# Patient Record
Sex: Male | Born: 1950 | Race: White | Hispanic: No | Smoking: Former smoker
Health system: Southern US, Community
[De-identification: ages and names within clinical notes are randomized; demographics above are authoritative.]

## PROBLEM LIST (undated history)

## (undated) DIAGNOSIS — N028 Recurrent and persistent hematuria with other morphologic changes: Secondary | ICD-10-CM

## (undated) DIAGNOSIS — F410 Panic disorder [episodic paroxysmal anxiety] without agoraphobia: Secondary | ICD-10-CM

## (undated) DIAGNOSIS — I714 Abdominal aortic aneurysm, without rupture, unspecified: Secondary | ICD-10-CM

## (undated) DIAGNOSIS — J449 Chronic obstructive pulmonary disease, unspecified: Secondary | ICD-10-CM

## (undated) DIAGNOSIS — G4730 Sleep apnea, unspecified: Secondary | ICD-10-CM

## (undated) DIAGNOSIS — K219 Gastro-esophageal reflux disease without esophagitis: Secondary | ICD-10-CM

## (undated) DIAGNOSIS — N189 Chronic kidney disease, unspecified: Secondary | ICD-10-CM

## (undated) DIAGNOSIS — C44311 Basal cell carcinoma of skin of nose: Secondary | ICD-10-CM

## (undated) DIAGNOSIS — I471 Supraventricular tachycardia: Secondary | ICD-10-CM

## (undated) DIAGNOSIS — N049 Nephrotic syndrome with unspecified morphologic changes: Secondary | ICD-10-CM

## (undated) DIAGNOSIS — N02B9 Other recurrent and persistent immunoglobulin A nephropathy: Secondary | ICD-10-CM

## (undated) HISTORY — DX: Chronic kidney disease, unspecified: N18.9

## (undated) HISTORY — PX: KNEE ARTHROSCOPY: SHX127

## (undated) HISTORY — PX: FRACTURE SURGERY: SHX138

## (undated) HISTORY — DX: Nephrotic syndrome with unspecified morphologic changes: N04.9

## (undated) HISTORY — PX: BASAL CELL CARCINOMA EXCISION: SHX1214

## (undated) HISTORY — PX: TONSILLECTOMY AND ADENOIDECTOMY: SUR1326

---

## 1968-08-11 HISTORY — PX: FEMUR FRACTURE SURGERY: SHX633

## 1969-08-11 HISTORY — PX: FEMUR HARDWARE REMOVAL: SUR1124

## 1997-11-10 ENCOUNTER — Emergency Department (HOSPITAL_COMMUNITY): Admission: EM | Admit: 1997-11-10 | Discharge: 1997-11-10 | Payer: Self-pay | Admitting: Emergency Medicine

## 1997-11-19 ENCOUNTER — Emergency Department (HOSPITAL_COMMUNITY): Admission: EM | Admit: 1997-11-19 | Discharge: 1997-11-19 | Payer: Self-pay | Admitting: Emergency Medicine

## 2000-06-01 ENCOUNTER — Encounter: Payer: Self-pay | Admitting: Occupational Medicine

## 2000-06-01 ENCOUNTER — Encounter: Admission: RE | Admit: 2000-06-01 | Discharge: 2000-06-01 | Payer: Self-pay | Admitting: Occupational Medicine

## 2002-05-30 ENCOUNTER — Encounter: Admission: RE | Admit: 2002-05-30 | Discharge: 2002-05-30 | Payer: Self-pay | Admitting: *Deleted

## 2004-06-10 ENCOUNTER — Encounter: Admission: RE | Admit: 2004-06-10 | Discharge: 2004-06-10 | Payer: Self-pay | Admitting: Occupational Medicine

## 2006-08-24 ENCOUNTER — Encounter: Admission: RE | Admit: 2006-08-24 | Discharge: 2006-08-24 | Payer: Self-pay | Admitting: Occupational Medicine

## 2006-09-25 ENCOUNTER — Ambulatory Visit (HOSPITAL_COMMUNITY): Admission: RE | Admit: 2006-09-25 | Discharge: 2006-09-25 | Payer: Self-pay | Admitting: Family Medicine

## 2006-09-25 ENCOUNTER — Ambulatory Visit: Payer: Self-pay | Admitting: Vascular Surgery

## 2008-08-23 ENCOUNTER — Encounter: Admission: RE | Admit: 2008-08-23 | Discharge: 2008-08-23 | Payer: Self-pay | Admitting: Occupational Medicine

## 2014-11-20 DIAGNOSIS — I872 Venous insufficiency (chronic) (peripheral): Secondary | ICD-10-CM | POA: Diagnosis not present

## 2014-12-11 ENCOUNTER — Emergency Department (HOSPITAL_COMMUNITY): Payer: Medicare Other

## 2014-12-11 ENCOUNTER — Emergency Department (HOSPITAL_BASED_OUTPATIENT_CLINIC_OR_DEPARTMENT_OTHER): Payer: Medicare Other

## 2014-12-11 ENCOUNTER — Encounter (HOSPITAL_COMMUNITY): Payer: Self-pay | Admitting: *Deleted

## 2014-12-11 ENCOUNTER — Emergency Department (HOSPITAL_COMMUNITY)
Admission: EM | Admit: 2014-12-11 | Discharge: 2014-12-11 | Disposition: A | Discharge status: Home / Self Care | Payer: Medicare Other | Attending: Emergency Medicine | Admitting: Emergency Medicine

## 2014-12-11 DIAGNOSIS — Z720 Tobacco use: Secondary | ICD-10-CM | POA: Insufficient documentation

## 2014-12-11 DIAGNOSIS — L97529 Non-pressure chronic ulcer of other part of left foot with unspecified severity: Secondary | ICD-10-CM

## 2014-12-11 DIAGNOSIS — M7989 Other specified soft tissue disorders: Secondary | ICD-10-CM | POA: Diagnosis not present

## 2014-12-11 DIAGNOSIS — L03116 Cellulitis of left lower limb: Secondary | ICD-10-CM | POA: Insufficient documentation

## 2014-12-11 DIAGNOSIS — L97829 Non-pressure chronic ulcer of other part of left lower leg with unspecified severity: Secondary | ICD-10-CM | POA: Diagnosis not present

## 2014-12-11 DIAGNOSIS — L02416 Cutaneous abscess of left lower limb: Secondary | ICD-10-CM | POA: Diagnosis present

## 2014-12-11 LAB — COMPREHENSIVE METABOLIC PANEL
ALT: 28 U/L (ref 17–63)
AST: 39 U/L (ref 15–41)
Albumin: 3.8 g/dL (ref 3.5–5.0)
Alkaline Phosphatase: 71 U/L (ref 38–126)
Anion gap: 10 (ref 5–15)
BILIRUBIN TOTAL: 0.8 mg/dL (ref 0.3–1.2)
BUN: 16 mg/dL (ref 6–20)
CHLORIDE: 104 mmol/L (ref 101–111)
CO2: 23 mmol/L (ref 22–32)
Calcium: 9.2 mg/dL (ref 8.9–10.3)
Creatinine, Ser: 0.91 mg/dL (ref 0.61–1.24)
GFR calc Af Amer: >60 mL/min (ref >60)
GLUCOSE: 102 mg/dL — AB (ref 70–99)
Potassium: 4.2 mmol/L (ref 3.5–5.1)
Sodium: 137 mmol/L (ref 135–145)
TOTAL PROTEIN: 6.9 g/dL (ref 6.5–8.1)

## 2014-12-11 LAB — CBC WITH DIFFERENTIAL/PLATELET
Basophils Absolute: 0 10*3/uL (ref 0.0–0.1)
Basophils Relative: 1 % (ref 0–1)
Eosinophils Absolute: 0.1 10*3/uL (ref 0.0–0.7)
Eosinophils Relative: 2 % (ref 0–5)
HEMATOCRIT: 46.7 % (ref 39.0–52.0)
HEMOGLOBIN: 16.1 g/dL (ref 13.0–17.0)
Lymphocytes Relative: 14 % (ref 12–46)
Lymphs Abs: 1.2 10*3/uL (ref 0.7–4.0)
MCH: 29.1 pg (ref 26.0–34.0)
MCHC: 34.5 g/dL (ref 30.0–36.0)
MCV: 84.3 fL (ref 78.0–100.0)
Monocytes Absolute: 0.5 10*3/uL (ref 0.1–1.0)
Monocytes Relative: 5 % (ref 3–12)
Neutro Abs: 6.8 10*3/uL (ref 1.7–7.7)
Neutrophils Relative %: 78 % — ABNORMAL HIGH (ref 43–77)
PLATELETS: 213 10*3/uL (ref 150–400)
RBC: 5.54 MIL/uL (ref 4.22–5.81)
RDW: 14.1 % (ref 11.5–15.5)
WBC: 8.7 10*3/uL (ref 4.0–10.5)

## 2014-12-11 LAB — I-STAT CG4 LACTIC ACID, ED: LACTIC ACID, VENOUS: 0.83 mmol/L (ref 0.5–2.0)

## 2014-12-11 MED ORDER — DOXYCYCLINE HYCLATE 100 MG PO CAPS: 100.0000 mg | ORAL_CAPSULE | Freq: Two times a day (BID) | ORAL | Status: DC

## 2014-12-11 MED ORDER — SODIUM CHLORIDE 0.9 % IV BOLUS (SEPSIS)
1000.0000 mL | Freq: Once | INTRAVENOUS | Status: AC
  Administered 2014-12-11: 1000 mL via INTRAVENOUS

## 2014-12-11 NOTE — Progress Notes (Signed)
*  Preliminary Results* Left lower extremity venous duplex completed. Left lower extremity is negative for deep vein thrombosis. There is no evidence of left Baker's cyst.  12/11/2014 3:42 PM  Maudry Mayhew, RVT, RDCS, RDMS for [name], RVT

## 2014-12-11 NOTE — ED Notes (Signed)
Pt states rash and abscesses since January.  States he did not go to pcp b/c he doesn't need one.  Multiple ulcers to L foot and leg with malodorous drainage.
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
## 2014-12-11 NOTE — ED Notes (Signed)
Provider at the bedside.  

## 2014-12-18 ENCOUNTER — Encounter: Payer: Self-pay | Admitting: Family Medicine

## 2014-12-18 ENCOUNTER — Ambulatory Visit: Payer: Medicare Other | Attending: Family Medicine | Admitting: Family Medicine

## 2014-12-18 VITALS — BP 131/84 | HR 84 | Temp 98.1°F | Wt 229.0 lb

## 2014-12-18 DIAGNOSIS — L03116 Cellulitis of left lower limb: Secondary | ICD-10-CM | POA: Diagnosis not present
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
## 2014-12-18 NOTE — Patient Instructions (Signed)
Leave dressings in place until you see the [facility]. I would recommend you make an appointment in the near future with your assigned primary care provider her for routine health I would recommend you consider smoking cesssation. Someone will call you about an appointment with Wound Care.

## 2014-12-19 DIAGNOSIS — L03116 Cellulitis of left lower limb: Secondary | ICD-10-CM | POA: Insufficient documentation

## 2015-01-22 ENCOUNTER — Encounter (HOSPITAL_BASED_OUTPATIENT_CLINIC_OR_DEPARTMENT_OTHER): Payer: Medicare Other | Attending: Plastic Surgery

## 2015-01-22 DIAGNOSIS — L97221 Non-pressure chronic ulcer of left calf limited to breakdown of skin: Secondary | ICD-10-CM | POA: Insufficient documentation

## 2015-01-22 DIAGNOSIS — F17210 Nicotine dependence, cigarettes, uncomplicated: Secondary | ICD-10-CM | POA: Insufficient documentation

## 2015-01-22 DIAGNOSIS — L97521 Non-pressure chronic ulcer of other part of left foot limited to breakdown of skin: Secondary | ICD-10-CM | POA: Diagnosis not present

## 2015-01-22 DIAGNOSIS — L97321 Non-pressure chronic ulcer of left ankle limited to breakdown of skin: Secondary | ICD-10-CM | POA: Insufficient documentation

## 2015-01-24 DIAGNOSIS — L97901 Non-pressure chronic ulcer of unspecified part of unspecified lower leg limited to breakdown of skin: Secondary | ICD-10-CM | POA: Diagnosis not present

## 2015-01-24 DIAGNOSIS — F17293 Nicotine dependence, other tobacco product, with withdrawal: Secondary | ICD-10-CM | POA: Diagnosis not present

## 2015-01-24 LAB — BASIC METABOLIC PANEL: Glucose: 114 mg/dL

## 2015-01-24 LAB — HEMOGLOBIN A1C: Hgb A1c MFr Bld: 5.6 % (ref 4.0–6.0)

## 2015-01-26 ENCOUNTER — Other Ambulatory Visit: Payer: Self-pay | Admitting: *Deleted

## 2015-01-26 DIAGNOSIS — L97929 Non-pressure chronic ulcer of unspecified part of left lower leg with unspecified severity: Secondary | ICD-10-CM

## 2015-01-29 DIAGNOSIS — L97521 Non-pressure chronic ulcer of other part of left foot limited to breakdown of skin: Secondary | ICD-10-CM | POA: Diagnosis not present

## 2015-01-29 DIAGNOSIS — L97221 Non-pressure chronic ulcer of left calf limited to breakdown of skin: Secondary | ICD-10-CM | POA: Diagnosis not present

## 2015-01-29 DIAGNOSIS — L97321 Non-pressure chronic ulcer of left ankle limited to breakdown of skin: Secondary | ICD-10-CM | POA: Diagnosis not present

## 2015-01-29 DIAGNOSIS — F17210 Nicotine dependence, cigarettes, uncomplicated: Secondary | ICD-10-CM | POA: Diagnosis not present

## 2015-01-31 ENCOUNTER — Ambulatory Visit (INDEPENDENT_AMBULATORY_CARE_PROVIDER_SITE_OTHER): Payer: Medicare Other | Admitting: Family

## 2015-01-31 ENCOUNTER — Encounter: Payer: Self-pay | Admitting: Family

## 2015-01-31 VITALS — BP 128/90 | HR 80 | Temp 98.3°F | Resp 18 | Ht 76.0 in | Wt 226.0 lb

## 2015-01-31 DIAGNOSIS — E785 Hyperlipidemia, unspecified: Secondary | ICD-10-CM

## 2015-01-31 DIAGNOSIS — Z0000 Encounter for general adult medical examination without abnormal findings: Secondary | ICD-10-CM | POA: Diagnosis not present

## 2015-01-31 DIAGNOSIS — Z23 Encounter for immunization: Secondary | ICD-10-CM

## 2015-01-31 NOTE — Progress Notes (Signed)
Pre visit review using our clinic review tool, if applicable. No additional management support is needed unless otherwise documented below in the visit note. 

## 2015-01-31 NOTE — Addendum Note (Signed)
Addended by: Delice Bison E on: 01/31/2015 02:28 PM   Modules accepted: Orders

## 2015-01-31 NOTE — Patient Instructions (Signed)
Thank you for choosing [facility].  Summary/Instructions:  Please stop by the lab on the basement level of the building for your blood work. Your results will be released to [place] (or called to you) after review, usually within 72 hours after test completion. If any changes need to be made, you will be notified at that same time.  If your symptoms worsen or fail to improve, please contact our office for further instruction, or in case of emergency go directly to the emergency room at the closest medical facility.    Health Maintenance  Topic Date Due  . HIV Screening  05/09/1964  . TETANUS/TDAP  05/09/1968  . COLONOSCOPY  05/10/1999  . ZOSTAVAX  05/09/2009  . PNA vac Low Risk Adult (1 of 2 - PCV13) 05/09/2014  . INFLUENZA VACCINE  03/12/2015   Health Maintenance A healthy lifestyle and preventative care can promote health and wellness.  Maintain regular health, dental, and eye exams.  Eat a healthy diet. Foods like vegetables, fruits, whole grains, low-fat dairy products, and lean protein foods contain the nutrients you need and are low in calories. Decrease your intake of foods high in solid fats, added sugars, and salt. Get information about a proper diet from your health care provider, if necessary.  Regular physical exercise is one of the most important things you can do for your health. Most adults should get at least 150 minutes of moderate-intensity exercise (any activity that increases your heart rate and causes you to sweat) each week. In addition, most adults need muscle-strengthening exercises on 2 or more days a week.   Maintain a healthy weight. The body mass index (BMI) is a screening tool to identify possible weight problems. It provides an estimate of body fat based on height and weight. Your health care provider can find your BMI and can help you achieve or maintain a healthy weight. For males 20 years and older:  A BMI below 18.5 is considered  underweight.  A BMI of 18.5 to 24.9 is normal.  A BMI of 25 to 29.9 is considered overweight.  A BMI of 30 and above is considered obese.  Maintain normal blood lipids and cholesterol by exercising and minimizing your intake of saturated fat. Eat a balanced diet with plenty of fruits and vegetables. Blood tests for lipids and cholesterol should begin at age 52 and be repeated every 5 years. If your lipid or cholesterol levels are high, you are over age 10, or you are at high risk for heart disease, you may need your cholesterol levels checked more frequently.Ongoing high lipid and cholesterol levels should be treated with medicines if diet and exercise are not working.  If you smoke, find out from your health care provider how to quit. If you do not use tobacco, do not start.  Lung cancer screening is recommended for adults aged 62-80 years who are at high risk for developing lung cancer because of a history of smoking. A yearly low-dose CT scan of the lungs is recommended for people who have at least a 30-pack-year history of smoking and are current smokers or have quit within the past 15 years. A pack year of smoking is smoking an average of 1 pack of cigarettes a day for 1 year (for example, a 30-pack-year history of smoking could mean smoking 1 pack a day for 30 years or 2 packs a day for 15 years). Yearly screening should continue until the smoker has stopped smoking for at least 15 years.  Yearly screening should be stopped for people who develop a health problem that would prevent them from having lung cancer treatment.  If you choose to drink alcohol, do not have more than 2 drinks per day. One drink is considered to be 12 oz (360 mL) of beer, 5 oz (150 mL) of wine, or 1.5 oz (45 mL) of liquor.  Avoid the use of street drugs. Do not share needles with anyone. Ask for help if you need support or instructions about stopping the use of drugs.  High blood pressure causes heart disease and  increases the risk of stroke. Blood pressure should be checked at least every 1-2 years. Ongoing high blood pressure should be treated with medicines if weight loss and exercise are not effective.  If you are 25-63 years old, ask your health care provider if you should take aspirin to prevent heart disease.  Diabetes screening involves taking a blood sample to check your fasting blood sugar level. This should be done once every 3 years after age 34 if you are at a normal weight and without risk factors for diabetes. Testing should be considered at a younger age or be carried out more frequently if you are overweight and have at least 1 risk factor for diabetes.  Colorectal cancer can be detected and often prevented. Most routine colorectal cancer screening begins at the age of 57 and continues through age 22. However, your health care provider may recommend screening at an earlier age if you have risk factors for colon cancer. On a yearly basis, your health care provider may provide home test kits to check for hidden blood in the stool. A small camera at the end of a tube may be used to directly examine the colon (sigmoidoscopy or colonoscopy) to detect the earliest forms of colorectal cancer. Talk to your health care provider about this at age 29 when routine screening begins. A direct exam of the colon should be repeated every 5-10 years through age 31, unless early forms of precancerous polyps or small growths are found.  People who are at an increased risk for hepatitis B should be screened for this virus. You are considered at high risk for hepatitis B if:  You were born in a country where hepatitis B occurs often. Talk with your health care provider about which countries are considered high risk.  Your parents were born in a high-risk country and you have not received a shot to protect against hepatitis B (hepatitis B vaccine).  You have HIV or AIDS.  You use needles to inject street  drugs.  You live with, or have sex with, someone who has hepatitis B.  You are a man who has sex with other men (MSM).  You get hemodialysis treatment.  You take certain medicines for conditions like cancer, organ transplantation, and autoimmune conditions.  Hepatitis C blood testing is recommended for all people born from [birth year] through 1967 and any individual with known risk factors for hepatitis C.  Healthy men should no longer receive prostate-specific antigen (PSA) blood tests as part of routine cancer screening. Talk to your health care provider about prostate cancer screening.  Testicular cancer screening is not recommended for adolescents or adult males who have no symptoms. Screening includes self-exam, a health care provider exam, and other screening tests. Consult with your health care provider about any symptoms you have or any concerns you have about testicular cancer.  Practice safe sex. Use condoms and avoid high-risk sexual practices to  reduce the spread of sexually transmitted infections (STIs).  You should be screened for STIs, including gonorrhea and chlamydia if:  You are sexually active and are younger than 24 years.  You are older than 24 years, and your health care provider tells you that you are at risk for this type of infection.  Your sexual activity has changed since you were last screened, and you are at an increased risk for chlamydia or gonorrhea. Ask your health care provider if you are at risk.  If you are at risk of being infected with HIV, it is recommended that you take a prescription medicine daily to prevent HIV infection. This is called pre-exposure prophylaxis (PrEP). You are considered at risk if:  You are a man who has sex with other men (MSM).  You are a heterosexual man who is sexually active with multiple partners.  You take drugs by injection.  You are sexually active with a partner who has HIV.  Talk with your health care provider about  whether you are at high risk of being infected with HIV. If you choose to begin PrEP, you should first be tested for HIV. You should then be tested every 3 months for as long as you are taking PrEP.  Use sunscreen. Apply sunscreen liberally and repeatedly throughout the day. You should seek shade when your shadow is shorter than you. Protect yourself by wearing long sleeves, pants, a wide-brimmed hat, and sunglasses year round whenever you are outdoors.  Tell your health care provider of new moles or changes in moles, especially if there is a change in shape or color. Also, tell your health care provider if a mole is larger than the size of a pencil eraser.  A one-time screening for abdominal aortic aneurysm (AAA) and surgical repair of large AAAs by ultrasound is recommended for men aged 36-75 years who are current or former smokers.  Stay current with your vaccines (immunizations). Document Released: 01/24/2008 Document Revised: 08/02/2013 Document Reviewed: 12/23/2010 [facility] Patient Information 2015 [place], [place]. This information is not intended to replace advice given to you by your health care provider. Make sure you discuss any questions you have with your health care provider.

## 2015-01-31 NOTE — Progress Notes (Signed)
Subjective:    Patient ID: George [name], male    DOB: 08-24-1950, 64 y.o.   MRN: [id number]  Chief Complaint  Patient presents with  . Establish Care    CPE    HPI:  George [name] is a 64 y.o. male who presents today for an annual wellness visit.   1) Health Maintenance -   Diet - Averages 1-2 meals per day with snacks. Consists of fruits, vegetable, chicken, red meat, occasional fish. 2 cups of caffeine per day.  Exercise - Outside of work no structured exercise program.   2) Preventative Exams / Immunizations:  Dental -- Up to date  Vision -- Due for exam   Health Maintenance  Topic Date Due  . HIV Screening  05/09/1964  . TETANUS/TDAP  05/09/1968  . COLONOSCOPY  05/10/1999  . ZOSTAVAX  05/09/2009  . PNA vac Low Risk Adult (1 of 2 - PCV13) 05/09/2014  . INFLUENZA VACCINE  03/12/2015  Update Tetanus; Declines colonoscopy or cologuard; Declines shingles;  Declines PNA   There is no immunization history on file for this patient.   RISK FACTORS  Tobacco History  Smoking status  . Current Every Day Smoker -- 2.00 packs/day  . Types: Cigarettes  Smokeless tobacco  . Not on file     Cardiac risk factors: advanced age (older than 32 for men, 13 for women), male gender and smoking/ tobacco exposure.  Depression Screen  Q1: Over the past two weeks, have you felt down, depressed or hopeless? No  Q2: Over the past two weeks, have you felt little interest or pleasure in doing things? No  Have you lost interest or pleasure in daily life? No  Do you often feel hopeless? No  Do you cry easily over simple problems? No  Activities of Daily Living In your present state of health, do you have any difficulty performing the following activities?:  Driving? No Managing money?  No Feeding yourself? No Getting from bed to chair? No Climbing a flight of stairs? No Preparing food and eating?: No Bathing or showering? No Getting dressed: No Getting to the toilet?  No Using the toilet: No Moving around from place to place: No In the past year have you fallen or had a near fall?:No   Home Safety Has smoke detector and wears seat belts. No firearms. No excess sun exposure. Are there smokers in your home (other than you)?  No Do you feel safe at home?  Yes  Hearing Difficulties: No Do you often ask people to speak up or repeat themselves? No Do you experience ringing or noises in your ears? No  Do you have difficulty understanding soft or whispered voices? No    Cognitive Testing  Alert? Yes   Normal Appearance? Yes  Oriented to person? Yes  Place? Yes   Time? Yes  Recall of three objects?  Yes  Can perform simple calculations? Yes  Displays appropriate judgment? Yes  Can read the correct time from a watch face? Yes  Do you feel that you have a problem with memory? No  Do you often misplace items? No   Advanced Directives have been discussed with the patient? Yes  MMSE - Mini Mental State Exam 01/31/2015  Orientation to time 5  Orientation to Place 5  Registration 3  Attention/ Calculation 5  Recall 3  Language- name 2 objects 2  Language- repeat 1  Language- follow 3 step command 3  Language- read & follow direction 1  Write a sentence 1  Copy design 1  Total score 30      Current Physicians/Providers and Suppliers  1. Terri Piedra, FNP - Primary Care / PCP 2. Dr. Tobias Alexander - Wound Care  Indicate any recent Medical Services you may have received from other than [facility] providers in the past year (date may be approximate).  All answers were reviewed with the patient and necessary referrals were made:  [name], [place]   01/31/2015    Review of Systems  Constitutional: Denies fever, chills, fatigue, or significant weight gain/loss. HENT: Head: Denies headache or neck pain Ears: Denies changes in hearing, ringing in ears, earache, drainage Nose: Denies discharge, stuffiness, itching, nosebleed, sinus pain Throat: Denies sore  throat, hoarseness, dry mouth, sores, thrush Eyes: Denies loss/changes in vision, pain, redness, blurry/double vision, flashing lights Cardiovascular: Denies chest pain/discomfort, tightness, palpitations, shortness of breath with activity, difficulty lying down, swelling, sudden awakening with shortness of breath Decreased left lower extremity circulation Respiratory: Denies shortness of breath, cough, sputum production, wheezing Gastrointestinal: Denies dysphasia, heartburn, change in appetite, nausea, change in bowel habits, rectal bleeding, constipation, diarrhea, yellow skin or eyes Genitourinary: Denies frequency, urgency, burning/pain, blood in urine, incontinence, change in urinary strength. Musculoskeletal: Denies muscle/joint pain, stiffness, back pain, redness or swelling of joints, trauma Skin: Denies rashes, lumps, itching, dryness, color changes, or hair/nail changes Neurological: Denies dizziness, fainting, seizures, weakness, numbness, tingling, tremor Psychiatric - Denies nervousness, stress, depression or memory loss Endocrine: Denies heat or cold intolerance, sweating, frequent urination, excessive thirst, changes in appetite Hematologic: Denies ease of bruising or bleeding    Objective:     BP 128/90 mmHg  Pulse 80  Temp(Src) 98.3 F (36.8 C) (Oral)  Resp 18  Ht 6\' 4"  (1.93 m)  Wt 226 lb (102.513 kg)  BMI 27.52 kg/m2  SpO2 96% Nursing note and vital signs reviewed.  Physical Exam  Constitutional: He is oriented to person, place, and time. He appears well-developed and well-nourished.  HENT:  Head: Normocephalic.  Right Ear: Hearing, tympanic membrane, external ear and ear canal normal.  Left Ear: Hearing, tympanic membrane, external ear and ear canal normal.  Nose: Nose normal.  Mouth/Throat: Uvula is midline, oropharynx is clear and moist and mucous membranes are normal.  Eyes: Conjunctivae and EOM are normal. Pupils are equal, round, and reactive to light.    Neck: Neck supple. No JVD present. No tracheal deviation present. No thyromegaly present.  Cardiovascular: Normal rate, regular rhythm, normal heart sounds and intact distal pulses.   Pulmonary/Chest: Effort normal and breath sounds normal.  Abdominal: Soft. Bowel sounds are normal. He exhibits no distension and no mass. There is no tenderness. There is no rebound and no guarding.  Musculoskeletal: Normal range of motion. He exhibits no edema or tenderness.  Lymphadenopathy:    He has no cervical adenopathy.  Neurological: He is alert and oriented to person, place, and time. He has normal reflexes. No cranial nerve deficit. He exhibits normal muscle tone. Coordination normal.  Skin: Skin is warm and dry.  Psychiatric: He has a normal mood and affect. His behavior is normal. Judgment and thought content normal.       Assessment & Plan:   During the course of the visit the patient was educated and counseled about appropriate screening and preventive services including:    Pneumococcal vaccine   Td vaccine  Prostate cancer screening  Colorectal cancer screening  Nutrition counseling   Diet review for nutrition referral? Yes ____  Not Indicated _X___   Patient Instructions (the written plan) was given to the patient.  Medicare Attestation I have personally reviewed: The patient's medical and social history Their use of alcohol, tobacco or illicit drugs Their current medications and supplements The patient's functional ability including ADLs,fall risks, home safety risks, cognitive, and hearing and visual impairment Diet and physical activities Evidence for depression or mood disorders  The patient's weight, height, BMI,  have been recorded in the chart.  I have made referrals, counseling, and provided education to the patient based on review of the above and I have provided the patient with a written personalized care plan for preventive services.     [name],  [place]   01/31/2015     Problem List Items Addressed This Visit      Other   Medicare annual wellness visit, initial - Primary    Reviewed and updated patient's medical, surgical, family and social history. Medications and allergies were also reviewed. Basic screenings for depression, activities of daily living, hearing, cognition and safety were performed. Provider list was updated and health plan was provided to the patient.   1) Anticipatory Guidance: Discussed importance of wearing a seatbelt while driving and not texting while driving; changing batteries in smoke detector at least once annually; wearing suntan lotion when outside; eating a balanced and moderate diet; getting physical activity at least 30 minutes per day.  2) Immunizations / Screenings / Labs:  Tetanus updated today. Declined Zostavax, Pneumovax, and Prevnar. Due for a colonoscopy which he declined. All other screenings are up to date per recommendations. Obtain CMET and lipid profile when fasting.  Overall well exam. Risk factor for cardiovascular disease include tobacco use which he quit approximately 1 week ago and has been a smoker with a 100 pack year history. Continue other healthy lifestyle behaviors and choices. Discussed importance of wearing sun protection with being outside. Follow up prevention exam in 1 year, follow up office visit pending lab work.        Other Visit Diagnoses    Hyperlipidemia LDL goal <100        Relevant Orders    Lipid panel    Comprehensive metabolic panel

## 2015-01-31 NOTE — Assessment & Plan Note (Signed)
Reviewed and updated patient's medical, surgical, family and social history. Medications and allergies were also reviewed. Basic screenings for depression, activities of daily living, hearing, cognition and safety were performed. Provider list was updated and health plan was provided to the patient.   1) Anticipatory Guidance: Discussed importance of wearing a seatbelt while driving and not texting while driving; changing batteries in smoke detector at least once annually; wearing suntan lotion when outside; eating a balanced and moderate diet; getting physical activity at least 30 minutes per day.  2) Immunizations / Screenings / Labs:  Tetanus updated today. Declined Zostavax, Pneumovax, and Prevnar. Due for a colonoscopy which he declined. All other screenings are up to date per recommendations. Obtain CMET and lipid profile when fasting.  Overall well exam. Risk factor for cardiovascular disease include tobacco use which he quit approximately 1 week ago and has been a smoker with a 100 pack year history. Continue other healthy lifestyle behaviors and choices. Discussed importance of wearing sun protection with being outside. Follow up prevention exam in 1 year, follow up office visit pending lab work.

## 2015-02-01 DIAGNOSIS — L97221 Non-pressure chronic ulcer of left calf limited to breakdown of skin: Secondary | ICD-10-CM | POA: Diagnosis not present

## 2015-02-01 DIAGNOSIS — L97521 Non-pressure chronic ulcer of other part of left foot limited to breakdown of skin: Secondary | ICD-10-CM | POA: Diagnosis not present

## 2015-02-01 DIAGNOSIS — F17210 Nicotine dependence, cigarettes, uncomplicated: Secondary | ICD-10-CM | POA: Diagnosis not present

## 2015-02-01 DIAGNOSIS — L97321 Non-pressure chronic ulcer of left ankle limited to breakdown of skin: Secondary | ICD-10-CM | POA: Diagnosis not present

## 2015-02-05 DIAGNOSIS — L97521 Non-pressure chronic ulcer of other part of left foot limited to breakdown of skin: Secondary | ICD-10-CM | POA: Diagnosis not present

## 2015-02-05 DIAGNOSIS — F17210 Nicotine dependence, cigarettes, uncomplicated: Secondary | ICD-10-CM | POA: Diagnosis not present

## 2015-02-05 DIAGNOSIS — L97321 Non-pressure chronic ulcer of left ankle limited to breakdown of skin: Secondary | ICD-10-CM | POA: Diagnosis not present

## 2015-02-05 DIAGNOSIS — L97221 Non-pressure chronic ulcer of left calf limited to breakdown of skin: Secondary | ICD-10-CM | POA: Diagnosis not present

## 2015-02-08 ENCOUNTER — Encounter: Payer: Self-pay | Admitting: Family

## 2015-02-13 ENCOUNTER — Encounter (HOSPITAL_BASED_OUTPATIENT_CLINIC_OR_DEPARTMENT_OTHER): Payer: Medicare Other | Attending: General Surgery

## 2015-02-13 DIAGNOSIS — I878 Other specified disorders of veins: Secondary | ICD-10-CM | POA: Insufficient documentation

## 2015-02-13 DIAGNOSIS — F17293 Nicotine dependence, other tobacco product, with withdrawal: Secondary | ICD-10-CM | POA: Insufficient documentation

## 2015-02-13 DIAGNOSIS — L97321 Non-pressure chronic ulcer of left ankle limited to breakdown of skin: Secondary | ICD-10-CM | POA: Insufficient documentation

## 2015-02-13 DIAGNOSIS — L97901 Non-pressure chronic ulcer of unspecified part of unspecified lower leg limited to breakdown of skin: Secondary | ICD-10-CM | POA: Diagnosis not present

## 2015-02-19 ENCOUNTER — Encounter: Payer: Self-pay | Admitting: Vascular Surgery

## 2015-02-20 DIAGNOSIS — L97901 Non-pressure chronic ulcer of unspecified part of unspecified lower leg limited to breakdown of skin: Secondary | ICD-10-CM | POA: Diagnosis not present

## 2015-02-20 DIAGNOSIS — I878 Other specified disorders of veins: Secondary | ICD-10-CM | POA: Diagnosis not present

## 2015-02-20 DIAGNOSIS — L97321 Non-pressure chronic ulcer of left ankle limited to breakdown of skin: Secondary | ICD-10-CM | POA: Diagnosis not present

## 2015-02-20 DIAGNOSIS — F17293 Nicotine dependence, other tobacco product, with withdrawal: Secondary | ICD-10-CM | POA: Diagnosis not present

## 2015-02-21 ENCOUNTER — Ambulatory Visit (HOSPITAL_COMMUNITY)
Admission: RE | Admit: 2015-02-21 | Discharge: 2015-02-21 | Disposition: A | Discharge status: Home / Self Care | Payer: Medicare Other | Source: Ambulatory Visit | Attending: Vascular Surgery | Admitting: Vascular Surgery

## 2015-02-21 ENCOUNTER — Encounter: Payer: Self-pay | Admitting: Vascular Surgery

## 2015-02-21 ENCOUNTER — Ambulatory Visit (INDEPENDENT_AMBULATORY_CARE_PROVIDER_SITE_OTHER): Payer: Medicare Other | Admitting: Vascular Surgery

## 2015-02-21 VITALS — BP 103/70 | HR 82 | Resp 16 | Ht 76.0 in | Wt 227.0 lb

## 2015-02-21 DIAGNOSIS — F17293 Nicotine dependence, other tobacco product, with withdrawal: Secondary | ICD-10-CM | POA: Diagnosis not present

## 2015-02-21 DIAGNOSIS — L97901 Non-pressure chronic ulcer of unspecified part of unspecified lower leg limited to breakdown of skin: Secondary | ICD-10-CM | POA: Diagnosis not present

## 2015-02-21 DIAGNOSIS — I878 Other specified disorders of veins: Secondary | ICD-10-CM | POA: Diagnosis not present

## 2015-02-21 DIAGNOSIS — I872 Venous insufficiency (chronic) (peripheral): Secondary | ICD-10-CM | POA: Diagnosis not present

## 2015-02-21 DIAGNOSIS — L97929 Non-pressure chronic ulcer of unspecified part of left lower leg with unspecified severity: Secondary | ICD-10-CM | POA: Diagnosis not present

## 2015-02-21 NOTE — Patient Instructions (Signed)
Stasis Ulcer Stasis ulcers occur in the legs when the circulation is damaged. An ulcer may look like a small hole in the skin.  CAUSES Stasis ulcers occur because your veins do not work properly. Veins have valves that help the blood return to the heart. If these valves do not work right, blood flows backwards and backs up into the veins near the skin. This condition causes the veins to become larger because of increased pressure and may lead to a stasis ulcer. SYMPTOMS   Shallow (superficial) sore on the leg.  Clear drainage or weeping from the sore.  Leg pain or a feeling of heaviness. This may be worse at the end of the day.  Leg swelling.  Skin color changes. DIAGNOSIS  Your caregiver will make a diagnosis by examining your leg. Your caregiver may order tests such as an ultrasound or other studies to evaluate the blood flow of the leg. HOME CARE INSTRUCTIONS   Do not stand or sit in one position for long periods of time. Do not sit with your legs crossed. Rest with your legs raised during the day. If possible, it is best if you can elevate your legs above your heart for 30 minutes, 3 to 4 times a day.  Wear elastic stockings or support hose. Do not wear other tight encircling garments around legs, pelvis, or waist. This causes increased pressure in your veins. If your caregiver has applied compressive medicated wraps, use them as instructed.  Walk as much as possible to increase blood flow. If you are taking long rides in a car or plane, take a break to walk around every 2 hours. If not already on aspirin, take a baby aspirin before long trips unless you have medical reasons that prohibit this.  Raise the foot of your bed at night with 2-inch blocks if approved by your caregiver. This may not be desirable if you have heart failure or breathing problems.  If you get a cut in the skin over the vein and the vein bleeds, lie down with your leg raised and gently clean the area with a clean  cloth. Apply pressure on the cut until the bleeding stops. Then place a dressing on the cut. See your caregiver if it continues to bleed or needs stitches. Also, see your caregiver if you develop an infection.Signs of an infection include a fever, redness, increased pain, and drainage of pus.  If your caregiver has given you a follow-up appointment, it is very important to keep that appointment. Not keeping the appointment could result in a chronic or permanent injury, pain, and disability. If there is any problem keeping the appointment, call your caregiver for assistance. SEEK IMMEDIATE MEDICAL CARE IF:  The ulcer area starts to break down.  You have pain, redness, tenderness, pus, or hard swelling in your leg over a vein or near the ulcer.  Your leg pain is uncomfortable.  You develop an unexplained fever.  You develop chest pain or shortness of breath. Document Released: 04/22/2001 Document Revised: 10/20/2011 Document Reviewed: 11/17/2010 [facility] Patient Information 2015 [place], [place]. This information is not intended to replace advice given to you by your health care provider. Make sure you discuss any questions you have with your health care provider.

## 2015-02-21 NOTE — Progress Notes (Signed)
[facility] [place]  Patient name: George [name] MRN: [id number] DOB: 05-Mar-1951 Sex: male  REASON FOR CONSULT: ulcers left lower extremity. Referred by Dr. Theodoro Kos  HPI: ANTIONE OBAR is a 64 y.o. male Was sent for evaluation of wounds of his left lower extremity. He states that these wounds developed in January of this year and were not improving. For this reason he said has been going to the wound care center and has made good progress with compression therapy and aggressive wound care. I do not get any history of claudication or rest pain. He is not diabetic. He does tell me that in the 1970s he had an episode of phlebitis of the left lower extremity after an accident. Denies any previous history of DVT.  He had been smoking 2 packs per day for 50 years but quit 3 weeks ago and it sounds like he has every intention of staying off of the cigarettes. Dr. Migdalia Dk had stressed the importance of this with him in terms of wound healing.  I have reviewed the records from [facility]. The patient was seen by Sharon Seller, NP on 12/18/2014. He had cellulitis of the left foot. He had been seen in the emergency department on May 2 and started on doxycycline. At that time he was noted to have 4 ulcerations on the left lower leg.  History reviewed. No pertinent past medical history. Family History  Problem Relation Age of Onset  . Heart disease Mother     Before age 64  . Heart failure Father   . Heart disease Father     After age 33  . Healthy Maternal Grandmother   . Healthy Maternal Grandfather   . Diabetes Paternal Grandmother   . Healthy Paternal Grandfather    SOCIAL HISTORY: History  Substance Use Topics  . Smoking status: Former Smoker -- 2.00 packs/day for 50 years    Types: Cigarettes    Quit date: 01/24/2015  . Smokeless tobacco: Never Used  . Alcohol Use: No   No Known Allergies Current Outpatient  Prescriptions  Medication Sig Dispense Refill  . Aspirin-Salicylamide-Caffeine (BC HEADACHE POWDER PO) Take by mouth as needed.     No current facility-administered medications for this visit.   REVIEW OF SYSTEMS: Valu.Nieves ] denotes positive finding; [  ] denotes negative finding  CARDIOVASCULAR:  [ ]  chest pain   [ ]  chest pressure   [ ]  palpitations   [ ]  orthopnea   [ ]  dyspnea on exertion   Valu.Nieves ] claudication   [ ]  rest pain   [ ]  DVT   [ ]  phlebitis PULMONARY:   [ ]  productive cough   [ ]  asthma   [ ]  wheezing NEUROLOGIC:   [ ]  weakness  [ ]  paresthesias  [ ]  aphasia  [ ]  amaurosis  [ ]  dizziness HEMATOLOGIC:   [ ]  bleeding problems   [ ]  clotting disorders MUSCULOSKELETAL:  [ ]  joint pain   [ ]  joint swelling Valu.Nieves ] leg swelling left leg GASTROINTESTINAL: [ ]   blood in stool  [ ]   hematemesis GENITOURINARY:  [ ]   dysuria  [ ]   hematuria PSYCHIATRIC:  [ ]  history of major depression INTEGUMENTARY:  Valu.Nieves ] rashes  Valu.Nieves ] ulcers CONSTITUTIONAL:  [ ]  fever   [ ]  chills  PHYSICAL EXAM: Filed Vitals:   02/21/15 0953  BP: 103/70  Pulse: 82  Resp: 16  Height:  6\' 4"  (1.93 m)  Weight: 227 lb (102.967 kg)  SpO2: 96%   GENERAL: The patient is a well-nourished male, in no acute distress. The vital signs are documented above. CARDIOVASCULAR: There is a regular rate and rhythm. I do not detect carotid bruits. He has palpable femoral, popliteal, and pedal pulses bilaterally. He has no significant lower extremity swelling. He has hyperpigmentation of the left lower extremity. PULMONARY: There is good air exchange bilaterally without wheezing or rales. ABDOMEN: Soft and non-tender with normal pitched bowel sounds.  MUSCULOSKELETAL: There are no major deformities or cyanosis. NEUROLOGIC: No focal weakness or paresthesias are detected. SKIN: He has significant hyperpigmentation of the left lower extremity consistent with chronic venous insufficiency. There are several small ulcerations. There is one on the  lateral malleolus on the left which measures 7 x 12 mm. There are some smaller wounds on the posterior medial aspect the distal left calf and a small wound on the plantar aspect of the foot. No significant drainage or erythema associated with these.  PSYCHIATRIC: The patient has a normal affect.  DATA:  ARTERIAL DOPPLER STUDY: I have independently interpreted his arterial Doppler study which showed triphasic Doppler signals in the posterior tibial and dorsalis pedis positions bilaterally with normal ankle-brachial indices. The patient also had good waveforms in the digits.  I have also reviewed the left lower extremity venous duplex scan that was performed on 12/11/2014. This showed no evidence of DVT or superficial thrombophlebitis of the left lower extremity.  MEDICAL ISSUES:  CHRONIC VENOUS INSUFFICIENCY WITH ULCERATION: I reassured the patient that he has excellent arterial flow and should have adequate circulation to heal these wounds. In addition he has quit smoking which will be a tremendous help with respect to wound healing. Although he has not had a formal venous reflux study he clearly has evidence of chronic venous insufficiency of the left lower extremity with hyperpigmentation of the left lower extremity and venous ulcers. We have discussed the importance of intermittent leg elevation in the proper positioning for this. I have written him a prescription for knee-high compression stockings with a gradient of 20-30 mmHg that he can begin wearing once his ulcers are healed. I'll be happy to see him back at any time if the wounds do not heal. I think with continued aggressive wound care, compression therapy, and leg elevation, in addition to staying off of the cigarettes, I am confident that these wounds will heal.   Deitra Mayo [facility]: [phone]

## 2015-02-26 DIAGNOSIS — I878 Other specified disorders of veins: Secondary | ICD-10-CM | POA: Diagnosis not present

## 2015-02-26 DIAGNOSIS — F17293 Nicotine dependence, other tobacco product, with withdrawal: Secondary | ICD-10-CM | POA: Diagnosis not present

## 2015-02-26 DIAGNOSIS — L97901 Non-pressure chronic ulcer of unspecified part of unspecified lower leg limited to breakdown of skin: Secondary | ICD-10-CM | POA: Diagnosis not present

## 2015-02-26 DIAGNOSIS — L97321 Non-pressure chronic ulcer of left ankle limited to breakdown of skin: Secondary | ICD-10-CM | POA: Diagnosis not present

## 2015-02-27 ENCOUNTER — Encounter: Payer: Self-pay | Admitting: Plastic Surgery

## 2015-03-05 DIAGNOSIS — L97901 Non-pressure chronic ulcer of unspecified part of unspecified lower leg limited to breakdown of skin: Secondary | ICD-10-CM | POA: Diagnosis not present

## 2015-03-05 DIAGNOSIS — F17293 Nicotine dependence, other tobacco product, with withdrawal: Secondary | ICD-10-CM | POA: Diagnosis not present

## 2015-03-05 DIAGNOSIS — I878 Other specified disorders of veins: Secondary | ICD-10-CM | POA: Diagnosis not present

## 2015-03-05 DIAGNOSIS — L97321 Non-pressure chronic ulcer of left ankle limited to breakdown of skin: Secondary | ICD-10-CM | POA: Diagnosis not present

## 2015-03-12 ENCOUNTER — Encounter (HOSPITAL_BASED_OUTPATIENT_CLINIC_OR_DEPARTMENT_OTHER): Payer: Medicare Other | Attending: Plastic Surgery

## 2015-03-12 DIAGNOSIS — F17293 Nicotine dependence, other tobacco product, with withdrawal: Secondary | ICD-10-CM | POA: Insufficient documentation

## 2015-03-12 DIAGNOSIS — I87312 Chronic venous hypertension (idiopathic) with ulcer of left lower extremity: Secondary | ICD-10-CM | POA: Diagnosis not present

## 2015-03-12 DIAGNOSIS — L97901 Non-pressure chronic ulcer of unspecified part of unspecified lower leg limited to breakdown of skin: Secondary | ICD-10-CM | POA: Diagnosis not present

## 2015-03-19 DIAGNOSIS — L97901 Non-pressure chronic ulcer of unspecified part of unspecified lower leg limited to breakdown of skin: Secondary | ICD-10-CM | POA: Diagnosis not present

## 2015-03-19 DIAGNOSIS — F17293 Nicotine dependence, other tobacco product, with withdrawal: Secondary | ICD-10-CM | POA: Diagnosis not present

## 2015-03-19 DIAGNOSIS — I87312 Chronic venous hypertension (idiopathic) with ulcer of left lower extremity: Secondary | ICD-10-CM | POA: Diagnosis not present

## 2015-03-26 DIAGNOSIS — F17293 Nicotine dependence, other tobacco product, with withdrawal: Secondary | ICD-10-CM | POA: Diagnosis not present

## 2015-03-26 DIAGNOSIS — I87312 Chronic venous hypertension (idiopathic) with ulcer of left lower extremity: Secondary | ICD-10-CM | POA: Diagnosis not present

## 2015-03-26 DIAGNOSIS — L97901 Non-pressure chronic ulcer of unspecified part of unspecified lower leg limited to breakdown of skin: Secondary | ICD-10-CM | POA: Diagnosis not present

## 2015-04-02 DIAGNOSIS — F17293 Nicotine dependence, other tobacco product, with withdrawal: Secondary | ICD-10-CM | POA: Diagnosis not present

## 2015-04-02 DIAGNOSIS — I87312 Chronic venous hypertension (idiopathic) with ulcer of left lower extremity: Secondary | ICD-10-CM | POA: Diagnosis not present

## 2015-04-02 DIAGNOSIS — L97901 Non-pressure chronic ulcer of unspecified part of unspecified lower leg limited to breakdown of skin: Secondary | ICD-10-CM | POA: Diagnosis not present

## 2015-04-09 DIAGNOSIS — I87312 Chronic venous hypertension (idiopathic) with ulcer of left lower extremity: Secondary | ICD-10-CM | POA: Diagnosis not present

## 2015-04-09 DIAGNOSIS — L97901 Non-pressure chronic ulcer of unspecified part of unspecified lower leg limited to breakdown of skin: Secondary | ICD-10-CM | POA: Diagnosis not present

## 2015-04-09 DIAGNOSIS — F17293 Nicotine dependence, other tobacco product, with withdrawal: Secondary | ICD-10-CM | POA: Diagnosis not present

## 2015-04-26 DIAGNOSIS — I776 Arteritis, unspecified: Secondary | ICD-10-CM | POA: Diagnosis not present

## 2015-04-26 DIAGNOSIS — R21 Rash and other nonspecific skin eruption: Secondary | ICD-10-CM | POA: Diagnosis not present

## 2015-04-30 ENCOUNTER — Encounter (HOSPITAL_BASED_OUTPATIENT_CLINIC_OR_DEPARTMENT_OTHER): Payer: Medicare Other | Attending: Plastic Surgery

## 2015-04-30 DIAGNOSIS — I87312 Chronic venous hypertension (idiopathic) with ulcer of left lower extremity: Secondary | ICD-10-CM | POA: Insufficient documentation

## 2015-04-30 DIAGNOSIS — F17293 Nicotine dependence, other tobacco product, with withdrawal: Secondary | ICD-10-CM | POA: Diagnosis not present

## 2015-04-30 DIAGNOSIS — L97901 Non-pressure chronic ulcer of unspecified part of unspecified lower leg limited to breakdown of skin: Secondary | ICD-10-CM | POA: Diagnosis not present

## 2015-05-02 DIAGNOSIS — L309 Dermatitis, unspecified: Secondary | ICD-10-CM | POA: Diagnosis not present

## 2015-05-02 DIAGNOSIS — L308 Other specified dermatitis: Secondary | ICD-10-CM | POA: Diagnosis not present

## 2015-05-04 DIAGNOSIS — C44311 Basal cell carcinoma of skin of nose: Secondary | ICD-10-CM | POA: Diagnosis not present

## 2015-05-07 DIAGNOSIS — C44311 Basal cell carcinoma of skin of nose: Secondary | ICD-10-CM | POA: Diagnosis not present

## 2015-05-07 DIAGNOSIS — Z87891 Personal history of nicotine dependence: Secondary | ICD-10-CM | POA: Diagnosis not present

## 2015-05-07 DIAGNOSIS — L989 Disorder of the skin and subcutaneous tissue, unspecified: Secondary | ICD-10-CM | POA: Diagnosis not present

## 2015-05-07 DIAGNOSIS — I776 Arteritis, unspecified: Secondary | ICD-10-CM | POA: Diagnosis not present

## 2015-05-11 DIAGNOSIS — C44311 Basal cell carcinoma of skin of nose: Secondary | ICD-10-CM | POA: Diagnosis not present

## 2015-05-14 DIAGNOSIS — I776 Arteritis, unspecified: Secondary | ICD-10-CM | POA: Diagnosis not present

## 2015-05-14 DIAGNOSIS — R21 Rash and other nonspecific skin eruption: Secondary | ICD-10-CM | POA: Diagnosis not present

## 2015-05-22 ENCOUNTER — Encounter (HOSPITAL_COMMUNITY): Payer: Self-pay | Admitting: Physical Medicine and Rehabilitation

## 2015-05-22 ENCOUNTER — Observation Stay (HOSPITAL_COMMUNITY)
Admission: EM | Admit: 2015-05-22 | Discharge: 2015-05-23 | Disposition: A | Discharge status: Home / Self Care | Payer: Medicare Other | Attending: Cardiovascular Disease | Admitting: Cardiovascular Disease

## 2015-05-22 ENCOUNTER — Emergency Department (HOSPITAL_COMMUNITY): Payer: Medicare Other

## 2015-05-22 DIAGNOSIS — R7989 Other specified abnormal findings of blood chemistry: Secondary | ICD-10-CM

## 2015-05-22 DIAGNOSIS — I214 Non-ST elevation (NSTEMI) myocardial infarction: Secondary | ICD-10-CM | POA: Diagnosis not present

## 2015-05-22 DIAGNOSIS — Z7982 Long term (current) use of aspirin: Secondary | ICD-10-CM | POA: Insufficient documentation

## 2015-05-22 DIAGNOSIS — R0600 Dyspnea, unspecified: Secondary | ICD-10-CM | POA: Diagnosis not present

## 2015-05-22 DIAGNOSIS — I471 Supraventricular tachycardia, unspecified: Secondary | ICD-10-CM

## 2015-05-22 DIAGNOSIS — I10 Essential (primary) hypertension: Secondary | ICD-10-CM | POA: Diagnosis not present

## 2015-05-22 DIAGNOSIS — I959 Hypotension, unspecified: Secondary | ICD-10-CM | POA: Diagnosis not present

## 2015-05-22 DIAGNOSIS — Z87891 Personal history of nicotine dependence: Secondary | ICD-10-CM | POA: Insufficient documentation

## 2015-05-22 DIAGNOSIS — R000 Tachycardia, unspecified: Secondary | ICD-10-CM | POA: Diagnosis not present

## 2015-05-22 DIAGNOSIS — R55 Syncope and collapse: Secondary | ICD-10-CM | POA: Diagnosis not present

## 2015-05-22 DIAGNOSIS — R778 Other specified abnormalities of plasma proteins: Secondary | ICD-10-CM

## 2015-05-22 DIAGNOSIS — R031 Nonspecific low blood-pressure reading: Secondary | ICD-10-CM | POA: Diagnosis not present

## 2015-05-22 HISTORY — DX: Gastro-esophageal reflux disease without esophagitis: K21.9

## 2015-05-22 HISTORY — DX: Basal cell carcinoma of skin of nose: C44.311

## 2015-05-22 HISTORY — DX: Supraventricular tachycardia: I47.1

## 2015-05-22 HISTORY — DX: Supraventricular tachycardia, unspecified: I47.10

## 2015-05-22 LAB — COMPREHENSIVE METABOLIC PANEL
ALBUMIN: 2.9 g/dL — AB (ref 3.5–5.0)
ALT: 19 U/L (ref 17–63)
AST: 30 U/L (ref 15–41)
Alkaline Phosphatase: 53 U/L (ref 38–126)
Anion gap: 9 (ref 5–15)
BUN: 22 mg/dL — ABNORMAL HIGH (ref 6–20)
CHLORIDE: 100 mmol/L — AB (ref 101–111)
CO2: 26 mmol/L (ref 22–32)
CREATININE: 1.65 mg/dL — AB (ref 0.61–1.24)
Calcium: 8.4 mg/dL — ABNORMAL LOW (ref 8.9–10.3)
GFR calc non Af Amer: 42 mL/min — ABNORMAL LOW (ref >60)
GFR, EST AFRICAN AMERICAN: 48 mL/min — AB (ref >60)
Glucose, Bld: 133 mg/dL — ABNORMAL HIGH (ref 65–99)
Potassium: 5.3 mmol/L — ABNORMAL HIGH (ref 3.5–5.1)
SODIUM: 135 mmol/L (ref 135–145)
Total Bilirubin: 1.1 mg/dL (ref 0.3–1.2)
Total Protein: 5.5 g/dL — ABNORMAL LOW (ref 6.5–8.1)

## 2015-05-22 LAB — CBC
HEMATOCRIT: 44.5 % (ref 39.0–52.0)
Hemoglobin: 14.8 g/dL (ref 13.0–17.0)
MCH: 28 pg (ref 26.0–34.0)
MCHC: 33.3 g/dL (ref 30.0–36.0)
MCV: 84.3 fL (ref 78.0–100.0)
Platelets: 304 10*3/uL (ref 150–400)
RBC: 5.28 MIL/uL (ref 4.22–5.81)
RDW: 13.9 % (ref 11.5–15.5)
WBC: 10.3 10*3/uL (ref 4.0–10.5)

## 2015-05-22 LAB — URINALYSIS, ROUTINE W REFLEX MICROSCOPIC
Bilirubin Urine: NEGATIVE
GLUCOSE, UA: NEGATIVE mg/dL
Ketones, ur: NEGATIVE mg/dL
LEUKOCYTES UA: NEGATIVE
Nitrite: NEGATIVE
PROTEIN: 30 mg/dL — AB
Specific Gravity, Urine: 1.039 — ABNORMAL HIGH (ref 1.005–1.030)
UROBILINOGEN UA: 1 mg/dL (ref 0.0–1.0)
pH: 6 (ref 5.0–8.0)

## 2015-05-22 LAB — I-STAT TROPONIN, ED
TROPONIN I, POC: 0.14 ng/mL — AB (ref 0.00–0.08)
TROPONIN I, POC: 0.77 ng/mL — AB (ref 0.00–0.08)

## 2015-05-22 LAB — LIPASE, BLOOD: Lipase: 38 U/L (ref 22–51)

## 2015-05-22 LAB — HEPARIN LEVEL (UNFRACTIONATED): HEPARIN UNFRACTIONATED: 0.19 [IU]/mL — AB (ref 0.30–0.70)

## 2015-05-22 LAB — TSH: TSH: 3.87 u[IU]/mL (ref 0.350–4.500)

## 2015-05-22 LAB — TROPONIN I
TROPONIN I: 2.01 ng/mL — AB (ref <0.031)
Troponin I: 1.86 ng/mL (ref <0.031)

## 2015-05-22 LAB — URINE MICROSCOPIC-ADD ON

## 2015-05-22 LAB — I-STAT CG4 LACTIC ACID, ED
LACTIC ACID, VENOUS: 2.59 mmol/L — AB (ref 0.5–2.0)
Lactic Acid, Venous: 2.25 mmol/L (ref 0.5–2.0)

## 2015-05-22 MED ORDER — ZOLPIDEM TARTRATE 5 MG PO TABS: 5.0000 mg | ORAL_TABLET | Freq: Every evening | ORAL | Status: DC | PRN

## 2015-05-22 MED ORDER — ALUM & MAG HYDROXIDE-SIMETH 200-200-20 MG/5ML PO SUSP
30.0000 mL | ORAL | Status: DC | PRN
  Administered 2015-05-22 – 2015-05-23 (×3): 30 mL via ORAL
  Filled 2015-05-22 (×2): qty 30

## 2015-05-22 MED ORDER — ASPIRIN 81 MG PO CHEW
324.0000 mg | CHEWABLE_TABLET | Freq: Once | ORAL | Status: AC
  Administered 2015-05-22: 324 mg via ORAL
  Filled 2015-05-22: qty 4

## 2015-05-22 MED ORDER — SODIUM CHLORIDE 0.9 % IV SOLN
2000.0000 mg | Freq: Once | INTRAVENOUS | Status: AC
  Administered 2015-05-22: 2000 mg via INTRAVENOUS
  Filled 2015-05-22: qty 2000

## 2015-05-22 MED ORDER — SODIUM CHLORIDE 0.9 % IV BOLUS (SEPSIS)
1000.0000 mL | Freq: Once | INTRAVENOUS | Status: AC
  Administered 2015-05-22: 1000 mL via INTRAVENOUS

## 2015-05-22 MED ORDER — NITROGLYCERIN 0.4 MG SL SUBL
0.4000 mg | SUBLINGUAL_TABLET | SUBLINGUAL | Status: AC | PRN
  Administered 2015-05-22: 0.4 mg via SUBLINGUAL

## 2015-05-22 MED ORDER — HEPARIN (PORCINE) IN NACL 100-0.45 UNIT/ML-% IJ SOLN
1600.0000 [IU]/h | INTRAMUSCULAR | Status: DC
Start: 2015-05-22 — End: 2015-05-23
  Administered 2015-05-22: 1250 [IU]/h via INTRAVENOUS
  Administered 2015-05-23: 1450 [IU]/h via INTRAVENOUS
  Filled 2015-05-22 (×2): qty 250

## 2015-05-22 MED ORDER — IOHEXOL 350 MG/ML SOLN
80.0000 mL | Freq: Once | INTRAVENOUS | Status: AC | PRN
Start: 2015-05-22 — End: 2015-05-22
  Administered 2015-05-22: 80 mL via INTRAVENOUS

## 2015-05-22 MED ORDER — HEPARIN BOLUS VIA INFUSION
2000.0000 [IU] | Freq: Once | INTRAVENOUS | Status: AC
  Administered 2015-05-22: 2000 [IU] via INTRAVENOUS
  Filled 2015-05-22: qty 2000

## 2015-05-22 MED ORDER — ALPRAZOLAM 0.25 MG PO TABS: 0.2500 mg | ORAL_TABLET | Freq: Two times a day (BID) | ORAL | Status: DC | PRN

## 2015-05-22 MED ORDER — ASPIRIN EC 81 MG PO TBEC
81.0000 mg | DELAYED_RELEASE_TABLET | Freq: Every day | ORAL | Status: DC
  Administered 2015-05-23: 81 mg via ORAL
  Filled 2015-05-22: qty 1

## 2015-05-22 MED ORDER — PIPERACILLIN-TAZOBACTAM 3.375 G IVPB 30 MIN
3.3750 g | Freq: Once | INTRAVENOUS | Status: AC
  Administered 2015-05-22: 3.375 g via INTRAVENOUS
  Filled 2015-05-22: qty 50

## 2015-05-22 MED ORDER — ALUM & MAG HYDROXIDE-SIMETH 200-200-20 MG/5ML PO SUSP
15.0000 mL | Freq: Once | ORAL | Status: AC
  Administered 2015-05-22: 15 mL via ORAL
  Filled 2015-05-22: qty 30

## 2015-05-22 MED ORDER — ONDANSETRON HCL 4 MG/2ML IJ SOLN: 4.0000 mg | Freq: Four times a day (QID) | INTRAMUSCULAR | Status: DC | PRN

## 2015-05-22 MED ORDER — HEPARIN BOLUS VIA INFUSION
4000.0000 [IU] | Freq: Once | INTRAVENOUS | Status: AC
  Administered 2015-05-22: 4000 [IU] via INTRAVENOUS
  Filled 2015-05-22: qty 4000

## 2015-05-22 MED ORDER — LIDOCAINE VISCOUS 2 % MT SOLN
15.0000 mL | Freq: Once | OROMUCOSAL | Status: AC
  Administered 2015-05-22: 15 mL via OROMUCOSAL
  Filled 2015-05-22: qty 15

## 2015-05-22 MED ORDER — ACETAMINOPHEN 325 MG PO TABS: 650.0000 mg | ORAL_TABLET | ORAL | Status: DC | PRN

## 2015-05-22 MED ORDER — NITROGLYCERIN 0.4 MG SL SUBL: 0.4000 mg | SUBLINGUAL_TABLET | SUBLINGUAL | Status: DC | PRN

## 2015-05-22 MED ORDER — ALUM & MAG HYDROXIDE-SIMETH 200-200-20 MG/5ML PO SUSP
15.0000 mL | ORAL | Status: DC | PRN
  Filled 2015-05-22: qty 30

## 2015-05-22 MED ORDER — NITROGLYCERIN 0.4 MG SL SUBL
0.4000 mg | SUBLINGUAL_TABLET | SUBLINGUAL | Status: DC | PRN
  Administered 2015-05-22: 0.4 mg via SUBLINGUAL

## 2015-05-22 NOTE — Progress Notes (Addendum)
ANTICOAGULATION CONSULT NOTE - Initial Consult  Pharmacy Consult for heparin Indication: chest pain/ACS  No Known Allergies  Patient Measurements: Height: 6\' 4"  (193 cm) Weight: 235 lb (106.595 kg) IBW/kg (Calculated) : 86.8 Heparin Dosing Weight: 106.6kg  Vital Signs: Temp: 98 F (36.7 C) (10/11 0949) Temp Source: Oral (10/11 0949) BP: 102/70 mmHg (10/11 1251) Pulse Rate: 86 (10/11 1251)  Labs:  Recent Labs  05/22/15 1003  HGB 14.8  HCT 44.5  PLT 304  CREATININE 1.65*    Estimated Creatinine Clearance: 59 mL/min (by C-G formula based on Cr of 1.65).   Medical History: History reviewed. No pertinent past medical history.   Assessment: 89 yom presenting for eval of SVT. No CP on admit. Now pharmacy consulted to dose heparin IV for ACS (no anticoag pta). CBC wnl. No bleed documented.   Goal of Therapy:  Heparin level 0.3-0.7 units/ml Monitor platelets by anticoagulation protocol: Yes   Plan:  Heparin 4000 unit bolus Heparin 1250 units/h 6h HL Daily HL/CBC Mon s/sx bleeding   Elicia Lamp, PharmD Clinical Pharmacist Pager [phone] 05/22/2015 1:15 PM

## 2015-05-22 NOTE — ED Notes (Signed)
Report attempted x 1

## 2015-05-22 NOTE — ED Provider Notes (Signed)
CSN: [id number]     Arrival date & time 05/22/15  0941 History   First MD Initiated Contact with Patient 05/22/15 [phone]     Chief Complaint  Patient presents with  . Palpitations     (Consider location/radiation/quality/duration/timing/severity/associated sxs/prior Treatment) Patient is a 64 y.o. male presenting with chest pain. The history is provided by the patient.  Chest Pain Pain location:  Epigastric Pain quality: pressure and sharp   Pain radiates to:  Neck Pain radiates to the back: no   Pain severity:  Moderate Onset quality:  Sudden Duration:  2 hours Timing:  Constant Progression:  Worsening Chronicity:  New Relieved by:  Nothing Worsened by:  Certain positions, exertion and movement Ineffective treatments:  None tried Associated symptoms: diaphoresis, syncope and weakness   Associated symptoms: no abdominal pain, no fever, no headache, no palpitations, no shortness of breath and not vomiting   Risk factors: no coronary artery disease, no diabetes mellitus, no high cholesterol and no hypertension     63 yo M with a chief complaint of chest pain. Patient was walking around earlier this morning and felt like he was having heartburn. Patient services severe that he felt really weak and almost passed out times. Call 911, when EMS arrived he is found to be in SVT. Heart rate in the 160s. He was converted with adenosine. After which the patient was persistently hypotensive. EMS gave him 1 L of fluids. Had A cardiac monitor. No STEMI in the field. Patient continued to have chest pain.  History reviewed. No pertinent past medical history. Past Surgical History  Procedure Laterality Date  . Fracture surgery      R leg  . Tonsillectomy and adenoidectomy     Family History  Problem Relation Age of Onset  . Heart disease Mother     Before age 32  . Heart failure Father   . Heart disease Father     After age 38  . Healthy Maternal Grandmother   . Healthy Maternal  Grandfather   . Diabetes Paternal Grandmother   . Healthy Paternal Grandfather    Social History  Substance Use Topics  . Smoking status: Former Smoker -- 2.00 packs/day for 50 years    Types: Cigarettes    Quit date: 01/24/2015  . Smokeless tobacco: Never Used  . Alcohol Use: No    Review of Systems  Constitutional: Positive for diaphoresis. Negative for fever and chills.  HENT: Negative for congestion and facial swelling.   Eyes: Negative for discharge and visual disturbance.  Respiratory: Negative for shortness of breath.   Cardiovascular: Positive for syncope. Negative for chest pain and palpitations.  Gastrointestinal: Negative for vomiting, abdominal pain and diarrhea.  Musculoskeletal: Negative for myalgias and arthralgias.  Skin: Negative for color change and rash.  Neurological: Positive for weakness. Negative for tremors, syncope and headaches.  Psychiatric/Behavioral: Negative for confusion and dysphoric mood.      Allergies  Review of patient's allergies indicates no known allergies.  Home Medications   Prior to Admission medications   Medication Sig Start Date End Date Taking? Authorizing Provider  Aspirin-Salicylamide-Caffeine (BC HEADACHE POWDER PO) Take 1 packet by mouth every 8 (eight) hours as needed (headache).    Yes Historical Provider, MD   BP 122/84 mmHg  Pulse 82  Temp(Src) 98 F (36.7 C) (Oral)  Resp 21  Ht 6\' 4"  (1.93 m)  Wt 235 lb (106.595 kg)  BMI 28.62 kg/m2  SpO2 100% Physical Exam  Constitutional: He is  oriented to person, place, and time. He appears well-developed and well-nourished.  Cool, clammy  HENT:  Head: Normocephalic and atraumatic.  Eyes: EOM are normal. Pupils are equal, round, and reactive to light.  Neck: Normal range of motion. Neck supple. No JVD present.  Cardiovascular: Normal rate and regular rhythm.  Exam reveals no gallop and no friction rub.   No murmur heard. Pulmonary/Chest: No respiratory distress. He has no  wheezes. He has no rales.  Abdominal: He exhibits no distension. There is no tenderness. There is no rebound and no guarding.  Musculoskeletal: Normal range of motion.  Neurological: He is alert and oriented to person, place, and time.  Skin: No rash noted. He is diaphoretic. No pallor.  Psychiatric: He has a normal mood and affect. His behavior is normal.    ED Course  Procedures (including critical care time) Labs Review Labs Reviewed  COMPREHENSIVE METABOLIC PANEL - Abnormal; Notable for the following:    Potassium 5.3 (*)    Chloride 100 (*)    Glucose, Bld 133 (*)    BUN 22 (*)    Creatinine, Ser 1.65 (*)    Calcium 8.4 (*)    Total Protein 5.5 (*)    Albumin 2.9 (*)    GFR calc non Af Amer 42 (*)    GFR calc Af Amer 48 (*)    All other components within normal limits  URINALYSIS, ROUTINE W REFLEX MICROSCOPIC (NOT AT [facility]) - Abnormal; Notable for the following:    APPearance HAZY (*)    Specific Gravity, Urine 1.039 (*)    Hgb urine dipstick MODERATE (*)    Protein, ur 30 (*)    All other components within normal limits  URINE MICROSCOPIC-ADD ON - Abnormal; Notable for the following:    Casts HYALINE CASTS (*)    All other components within normal limits  I-STAT TROPOININ, ED - Abnormal; Notable for the following:    Troponin i, poc 0.14 (*)    All other components within normal limits  I-STAT CG4 LACTIC ACID, ED - Abnormal; Notable for the following:    Lactic Acid, Venous 2.59 (*)    All other components within normal limits  I-STAT TROPOININ, ED - Abnormal; Notable for the following:    Troponin i, poc 0.77 (*)    All other components within normal limits  I-STAT CG4 LACTIC ACID, ED - Abnormal; Notable for the following:    Lactic Acid, Venous 2.25 (*)    All other components within normal limits  CBC  LIPASE, BLOOD  HEPARIN LEVEL (UNFRACTIONATED)  I-STAT TROPOININ, ED    Imaging Review Ct Angio Chest Pe W/cm &/or Wo Cm  05/22/2015   CLINICAL DATA:   Difficulty breathing and tachycardia  EXAM: CT ANGIOGRAPHY CHEST WITH CONTRAST  TECHNIQUE: Multidetector CT imaging of the chest was performed using the standard protocol during bolus administration of intravenous contrast. Multiplanar CT image reconstructions and MIPs were obtained to evaluate the vascular anatomy.  CONTRAST:  59mL OMNIPAQUE IOHEXOL 350 MG/ML SOLN  COMPARISON:  Chest radiograph May 22, 2015  FINDINGS: There is no demonstrable pulmonary embolus. There is no thoracic aortic aneurysm. The visualized great vessels appear unremarkable. A small amount of atherosclerotic calcification is noted in the aorta. There also scattered foci of coronary artery calcification. Pericardium is not thickened.  There are small scattered bullae throughout both upper lobes, more on the right than on the left. On axial slice 38 series 5, there is a 5 mm nodular  opacity in the anterior segment right upper lobe. There is patchy bibasilar atelectatic change with minimal pleural effusions bilaterally. There is interstitial edema. Areas of mild patchy ground-glass opacity are felt to be consistent with edema. There is no consolidation suggesting focal pneumonia.  Thyroid appears unremarkable. There are small scattered mediastinal lymph nodes throughout the chest. There is a lymph node in the right hilar region measuring 1.9 x 1.3 cm. There is a lymph node in the left hilum measuring 1.5 x 1.2 cm. There is a small hiatal hernia.  There is an intramuscular lipoma lateral to the cervical-thoracic junction on the left, benign in appearance. This lipoma measures approximately 3.5 x 1.5 cm.  In the visualized upper abdomen, no lesions are identified.  There are no blastic or lytic bone lesions.  Review of the MIP images confirms the above findings.  IMPRESSION: No demonstrable pulmonary embolus.  Bibasilar atelectatic change. Mild interstitial edema is present. There is no appreciable alveolar consolidation. On axial slice 38  series 5, there is a 5 mm nodular opacity in the anterior segment of the right upper lobe. Followup of this nodular opacity should be based on Fleischner Society guidelines. If the patient is at high risk for bronchogenic carcinoma, follow-up chest CT at 6-12 months is recommended. If the patient is at low risk for bronchogenic carcinoma, follow-up chest CT at 12 months is recommended. This recommendation follows the consensus statement: Guidelines for Management of Small Pulmonary Nodules Detected on CT Scans: A Statement from the [place] as published in Radiology 2005;237:395-400.  Multiple small mediastinal lymph nodes. Borderline prominent bilateral hilar lymph nodes identified, likely of reactive etiology.  Scattered foci of coronary artery calcification.  Small hiatal hernia.   Electronically Signed   By: Lowella Grip III M.D.   On: 05/22/2015 12:31   Dg Chest Port 1 View  05/22/2015   CLINICAL DATA:  Chest pain.  Syncope.  EXAM: PORTABLE CHEST 1 VIEW  COMPARISON:  08/23/2008  FINDINGS: Mild left lower lobe infiltrate, suspicious for pneumonia. Possible small area of atelectasis versus infiltrate in the right lung base  Negative for heart failure.  No pleural effusion.  IMPRESSION: Left lower lobe infiltrate, possible pneumonia. Mild right lower lobe atelectasis/ infiltrate.   Electronically Signed   By: Franchot Gallo M.D.   On: 05/22/2015 10:38   I have personally reviewed and evaluated these images and lab results as part of my medical decision-making.   EKG Interpretation   Date/Time:  Tuesday May 22 2015 09:49:02 EDT Ventricular Rate:  96 PR Interval:  143 QRS Duration: 77 QT Interval:  354 QTC Calculation: 447 R Axis:   48 Text Interpretation:  Sinus rhythm No old tracing to compare Confirmed by  Addelynn Batte MD, DANIEL [id number]) on 05/22/2015 9:53:13 AM        EMERGENCY DEPARTMENT [place] CARDIAC EXAM "Study: Limited Ultrasound of the heart and  pericardium"  INDICATIONS:Hypotension Multiple views of the heart and pericardium are obtained with a multi-frequency probe.  PERFORMED UE:[id number]  IMAGES ARCHIVED?: Yes  FINDINGS: No pericardial effusion, Decreased contractility, IVC flat and Tamponade physiology absent  LIMITATIONS:  Body habitus  VIEWS USED: Subcostal 4 chamber, Apical 4 chamber  and Inferior Vena Cava  INTERPRETATION: Cardiac activity present, Pericardial effusioin absent, Cardiac tamponade absent, Probable low CVP and Decreased contractility  COMMENT:  Hypotensive post adenosine, fluid underloaded  MDM   Final diagnoses:  NSTEMI (non-ST elevated myocardial infarction) (HCC)  SVT (supraventricular tachycardia) ([place])    64 yo  M with a chief complaint of chest pain. Initially was in SVT. Converted and brought to the ED. Was hypotensive into the 60s on arrival here. Patient was given 3 L of IV fluid with improvement of BP. Bedside ultrasound showing collapsible IVC on inspiration. Patient's initial EKG concerning for a flipped T waves in aVL as well as possible elevation in the inferior leads. EKG was discussed with Dr. Claiborne Billings who is on call for cardiology STEMI. Feel that this is not a STEMI at this time. Patient had elevated initial troponin. With hypertension and starting as a SVT concern for other etiologies of his pain. CT PE study negative for dissection as well as PE. Started on a heparin drip. Cardiology consultation for possible NSTEMI.  CRITICAL CARE Performed by: Cecilio Asper   Total critical care time: 40 min  Critical care time was exclusive of separately billable procedures and treating other patients.  Critical care was necessary to treat or prevent imminent or life-threatening deterioration.  Critical care was time spent personally by me on the following activities: development of treatment plan with patient and/or surrogate as well as nursing, discussions with consultants, evaluation of  patient's response to treatment, examination of patient, obtaining history from patient or surrogate, ordering and performing treatments and interventions, ordering and review of laboratory studies, ordering and review of radiographic studies, pulse oximetry and re-evaluation of patient's condition.   The patients results and plan were reviewed and discussed.   Any x-rays performed were independently reviewed by myself.   Differential diagnosis were considered with the presenting HPI.  Medications  nitroGLYCERIN (NITROSTAT) SL tablet 0.4 mg (0.4 mg Sublingual Given 05/22/15 1315)  heparin ADULT infusion 100 units/mL (25000 units/250 mL) (1,250 Units/hr Intravenous New Bag/Given 05/22/15 1335)  aspirin chewable tablet 324 mg (324 mg Oral Given 05/22/15 1010)  sodium chloride 0.9 % bolus 1,000 mL (0 mLs Intravenous Stopped 05/22/15 1251)  sodium chloride 0.9 % bolus 1,000 mL (0 mLs Intravenous Stopped 05/22/15 1434)  vancomycin (VANCOCIN) 2,000 mg in sodium chloride 0.9 % 500 mL IVPB (0 mg Intravenous Stopped 05/22/15 1330)  piperacillin-tazobactam (ZOSYN) IVPB 3.375 g (0 g Intravenous Stopped 05/22/15 1129)  nitroGLYCERIN (NITROSTAT) SL tablet 0.4 mg (0.4 mg Sublingual Given 05/22/15 1249)  alum & mag hydroxide-simeth (MAALOX/MYLANTA) 200-200-20 MG/5ML suspension 15 mL (15 mLs Oral Given 05/22/15 1232)  lidocaine (XYLOCAINE) 2 % viscous mouth solution 15 mL (15 mLs Mouth/Throat Given 05/22/15 1232)  iohexol (OMNIPAQUE) 350 MG/ML injection 80 mL (80 mLs Intravenous Contrast Given 05/22/15 1157)  heparin bolus via infusion 4,000 Units (4,000 Units Intravenous Given 05/22/15 1337)    Filed Vitals:   05/22/15 1300 05/22/15 1315 05/22/15 1400 05/22/15 1430  BP: 123/77 108/58 108/87 122/84  Pulse: 105 87 83 82  Temp:      TempSrc:      Resp: 23 20 37 21  Height:      Weight:      SpO2: 97% 99% 100% 100%    Final diagnoses:  NSTEMI (non-ST elevated myocardial infarction) ([place])  SVT  (supraventricular tachycardia) ([place])    Admission/ observation were discussed with the admitting physician, patient and/or family and they are comfortable with the plan.    Deno Etienne, DO 05/22/15 1544

## 2015-05-22 NOTE — Progress Notes (Signed)
ANTICOAGULATION CONSULT NOTE - Follow Up  Pharmacy Consult for heparin Indication: chest pain/ACS  No Known Allergies  Patient Measurements: Height: 6\' 4"  (193 cm) Weight: 235 lb (106.595 kg) IBW/kg (Calculated) : 86.8 Heparin Dosing Weight: 106.6kg  Vital Signs: Temp: 98.1 F (36.7 C) (10/11 2015) Temp Source: Oral (10/11 2015) BP: 133/60 mmHg (10/11 2015) Pulse Rate: 83 (10/11 2015)  Labs:  Recent Labs  05/22/15 1003 05/22/15 1835 05/22/15 2005  HGB 14.8  --   --   HCT 44.5  --   --   PLT 304  --   --   HEPARINUNFRC  --   --  0.19*  CREATININE 1.65*  --   --   TROPONINI  --  1.86*  --    Estimated Creatinine Clearance: 59 mL/min (by C-G formula based on Cr of 1.65).  Medical History: Past Medical History  Diagnosis Date  . Basal cell carcinoma of right side of nose     "haven't had it removed yet" (05/22/2015)  . SVT (supraventricular tachycardia) ([place])   . GERD (gastroesophageal reflux disease)    Assessment: 23 yom presenting for eval of SVT, started on IV heparin.  His troponin has increased and is 1.86 tonight.  Heparin level is low at 0.19.  Spoke with the nurse who reports not issues with IV site and no noted bleeding.    Goal of Therapy:  Heparin level 0.3-0.7 units/ml Monitor platelets by anticoagulation protocol: Yes   Plan:  Re-bolus with Heparin 2000 units Increase Heparin rate to 1450 units/h F/U Daily HL/CBC Mon s/sx bleeding  Rober Minion, PharmD., MS Clinical Pharmacist Pager:  [phone] Thank you for allowing pharmacy to be part of this patients care team.  05/22/2015 8:53 PM

## 2015-05-22 NOTE — ED Notes (Signed)
Pt presents to department via [facility] for evaluation of SVT. Pt reports feeling of heart racing this morning, also states he became pale, cool and diaphoretic. Upon arrival pt is pale, reports feeling weak. Denies chest pain. Pt is alert and oriented x4. Sinus tachycardia noted on monitor.

## 2015-05-22 NOTE — Progress Notes (Signed)
Patient lying in bed, no distress at this time.  Heparin changed per order from Pharmacy.  Call light within reach.

## 2015-05-22 NOTE — H&P (Signed)
Cardiologist:  George [name] is an 64 y.o. male.   Chief Complaint: Dizzy, SVT HPI:   The patient is a 64 yo male with a history of 150 PY tobacco use, venous insufficiency.  He presented after feeling faint lightheaded.  He says he did not loose consciousness.  He also reported burning/indigestion which improved with maalox.   The patient currently denies nausea, vomiting, fever, chest pain, shortness of breath, orthopnea,  PND, cough, congestion, abdominal pain, hematochezia, melena, lower extremity edema, claudication.  Prior to Admission medications   Medication Sig Start Date End Date Taking? Authorizing Provider  Aspirin-Salicylamide-Caffeine (BC HEADACHE POWDER PO) Take 1 packet by mouth every 8 (eight) hours as needed (headache).    Yes Historical Provider, MD     History reviewed. No pertinent past medical history.  Past Surgical History  Procedure Laterality Date  . Fracture surgery      R leg  . Tonsillectomy and adenoidectomy      Family History  Problem Relation Age of Onset  . Heart disease Mother     Before age 45  . Heart failure Father   . Heart disease Father     After age 61  . Healthy Maternal Grandmother   . Healthy Maternal Grandfather   . Diabetes Paternal Grandmother   . Healthy Paternal Grandfather    Social History:  reports that he quit smoking about 3 months ago. His smoking use included Cigarettes. He has a 100 pack-year smoking history. He has never used smokeless tobacco. He reports that he does not drink alcohol or use illicit drugs.  Allergies: No Known Allergies   (Not in a hospital admission)  Results for orders placed or performed during the hospital encounter of 05/22/15 (from the past 48 hour(s))  CBC     Status: None   Collection Time: 05/22/15 10:03 AM  Result Value Ref Range   WBC 10.3 4.0 - 10.5 K/uL   RBC 5.28 4.22 - 5.81 MIL/uL   Hemoglobin 14.8 13.0 - 17.0 g/dL   HCT 44.5 39.0 - 52.0 %   MCV 84.3 78.0 -  100.0 fL   MCH 28.0 26.0 - 34.0 pg   MCHC 33.3 30.0 - 36.0 g/dL   RDW 13.9 11.5 - 15.5 %   Platelets 304 150 - 400 K/uL  Comprehensive metabolic panel     Status: Abnormal   Collection Time: 05/22/15 10:03 AM  Result Value Ref Range   Sodium 135 135 - 145 mmol/L   Potassium 5.3 (H) 3.5 - 5.1 mmol/L   Chloride 100 (L) 101 - 111 mmol/L   CO2 26 22 - 32 mmol/L   Glucose, Bld 133 (H) 65 - 99 mg/dL   BUN 22 (H) 6 - 20 mg/dL   Creatinine, Ser 1.65 (H) 0.61 - 1.24 mg/dL   Calcium 8.4 (L) 8.9 - 10.3 mg/dL   Total Protein 5.5 (L) 6.5 - 8.1 g/dL   Albumin 2.9 (L) 3.5 - 5.0 g/dL   AST 30 15 - 41 U/L   ALT 19 17 - 63 U/L   Alkaline Phosphatase 53 38 - 126 U/L   Total Bilirubin 1.1 0.3 - 1.2 mg/dL   GFR calc non Af Amer 42 (L) >60 mL/min   GFR calc Af Amer 48 (L) >60 mL/min    Comment: (NOTE) The eGFR has been calculated using the CKD EPI equation. This calculation has not been validated in all clinical situations. eGFR's persistently <60 mL/min signify possible Chronic  Kidney Disease.    Anion gap 9 5 - 15  Lipase, blood     Status: None   Collection Time: 05/22/15 10:03 AM  Result Value Ref Range   Lipase 38 22 - 51 U/L  I-stat troponin, ED (not at [facility])     Status: Abnormal   Collection Time: 05/22/15 10:14 AM  Result Value Ref Range   Troponin i, poc 0.14 (HH) 0.00 - 0.08 ng/mL   Comment NOTIFIED PHYSICIAN    Comment 3            Comment: Due to the release kinetics of cTnI, a negative result within the first hours of the onset of symptoms does not rule out myocardial infarction with certainty. If myocardial infarction is still suspected, repeat the test at appropriate intervals.   I-Stat CG4 Lactic Acid, ED     Status: Abnormal   Collection Time: 05/22/15 10:16 AM  Result Value Ref Range   Lactic Acid, Venous 2.59 (HH) 0.5 - 2.0 mmol/L   Comment NOTIFIED PHYSICIAN   Urinalysis, Routine w reflex microscopic (not at [facility])     Status: Abnormal   Collection Time: 05/22/15  12:52 PM  Result Value Ref Range   Color, Urine YELLOW YELLOW   APPearance HAZY (A) CLEAR   Specific Gravity, Urine 1.039 (H) 1.005 - 1.030   pH 6.0 5.0 - 8.0   Glucose, UA NEGATIVE NEGATIVE mg/dL   Hgb urine dipstick MODERATE (A) NEGATIVE   Bilirubin Urine NEGATIVE NEGATIVE   Ketones, ur NEGATIVE NEGATIVE mg/dL   Protein, ur 30 (A) NEGATIVE mg/dL   Urobilinogen, UA 1.0 0.0 - 1.0 mg/dL   Nitrite NEGATIVE NEGATIVE   Leukocytes, UA NEGATIVE NEGATIVE  Urine microscopic-add on     Status: Abnormal   Collection Time: 05/22/15 12:52 PM  Result Value Ref Range   Squamous Epithelial / LPF RARE RARE   WBC, UA 0-2 <3 WBC/hpf   RBC / HPF 7-10 <3 RBC/hpf   Bacteria, UA RARE RARE   Casts HYALINE CASTS (A) NEGATIVE  I-stat troponin, ED (not at [facility])     Status: Abnormal   Collection Time: 05/22/15  1:25 PM  Result Value Ref Range   Troponin i, poc 0.77 (HH) 0.00 - 0.08 ng/mL   Comment NOTIFIED PHYSICIAN    Comment 3            Comment: Due to the release kinetics of cTnI, a negative result within the first hours of the onset of symptoms does not rule out myocardial infarction with certainty. If myocardial infarction is still suspected, repeat the test at appropriate intervals.   I-Stat CG4 Lactic Acid, ED     Status: Abnormal   Collection Time: 05/22/15  1:32 PM  Result Value Ref Range   Lactic Acid, Venous 2.25 (HH) 0.5 - 2.0 mmol/L   Comment NOTIFIED PHYSICIAN    Ct Angio Chest Pe W/cm &/or Wo Cm  05/22/2015   CLINICAL DATA:  Difficulty breathing and tachycardia  EXAM: CT ANGIOGRAPHY CHEST WITH CONTRAST  TECHNIQUE: Multidetector CT imaging of the chest was performed using the standard protocol during bolus administration of intravenous contrast. Multiplanar CT image reconstructions and MIPs were obtained to evaluate the vascular anatomy.  CONTRAST:  9m OMNIPAQUE IOHEXOL 350 MG/ML SOLN  COMPARISON:  Chest radiograph May 22, 2015  FINDINGS: There is no demonstrable pulmonary  embolus. There is no thoracic aortic aneurysm. The visualized great vessels appear unremarkable. A small amount of atherosclerotic calcification is noted in the  aorta. There also scattered foci of coronary artery calcification. Pericardium is not thickened.  There are small scattered bullae throughout both upper lobes, more on the right than on the left. On axial slice 38 series 5, there is a 5 mm nodular opacity in the anterior segment right upper lobe. There is patchy bibasilar atelectatic change with minimal pleural effusions bilaterally. There is interstitial edema. Areas of mild patchy ground-glass opacity are felt to be consistent with edema. There is no consolidation suggesting focal pneumonia.  Thyroid appears unremarkable. There are small scattered mediastinal lymph nodes throughout the chest. There is a lymph node in the right hilar region measuring 1.9 x 1.3 cm. There is a lymph node in the left hilum measuring 1.5 x 1.2 cm. There is a small hiatal hernia.  There is an intramuscular lipoma lateral to the cervical-thoracic junction on the left, benign in appearance. This lipoma measures approximately 3.5 x 1.5 cm.  In the visualized upper abdomen, no lesions are identified.  There are no blastic or lytic bone lesions.  Review of the MIP images confirms the above findings.  IMPRESSION: No demonstrable pulmonary embolus.  Bibasilar atelectatic change. Mild interstitial edema is present. There is no appreciable alveolar consolidation. On axial slice 38 series 5, there is a 5 mm nodular opacity in the anterior segment of the right upper lobe. Followup of this nodular opacity should be based on Fleischner Society guidelines. If the patient is at high risk for bronchogenic carcinoma, follow-up chest CT at 6-12 months is recommended. If the patient is at low risk for bronchogenic carcinoma, follow-up chest CT at 12 months is recommended. This recommendation follows the consensus statement: Guidelines for  Management of Small Pulmonary Nodules Detected on CT Scans: A Statement from the [employer] as published in Radiology 2005;237:395-400.  Multiple small mediastinal lymph nodes. Borderline prominent bilateral hilar lymph nodes identified, likely of reactive etiology.  Scattered foci of coronary artery calcification.  Small hiatal hernia.   Electronically Signed   By: Lowella Grip III M.D.   On: 05/22/2015 12:31   Dg Chest Port 1 View  05/22/2015   CLINICAL DATA:  Chest pain.  Syncope.  EXAM: PORTABLE CHEST 1 VIEW  COMPARISON:  08/23/2008  FINDINGS: Mild left lower lobe infiltrate, suspicious for pneumonia. Possible small area of atelectasis versus infiltrate in the right lung base  Negative for heart failure.  No pleural effusion.  IMPRESSION: Left lower lobe infiltrate, possible pneumonia. Mild right lower lobe atelectasis/ infiltrate.   Electronically Signed   By: Franchot Gallo M.D.   On: 05/22/2015 10:38    Review of Systems  All other systems reviewed and are negative.  The patient currently denies nausea, vomiting, fever, shortness of breath, orthopnea, PND, cough, congestion, abdominal pain, hematochezia, melena, lower extremity edema, claudication. +duizziness, +chest burning   Blood pressure 108/58, pulse 87, temperature 98 F (36.7 C), temperature source Oral, resp. rate 20, height 6' 4" (1.93 m), weight 235 lb (106.595 kg), SpO2 99 %. Physical Exam  Nursing note and vitals reviewed. Constitutional: He is oriented to person, place, and time. He appears well-developed and well-nourished. No distress.  HENT:  Head: Normocephalic and atraumatic.  Eyes: EOM are normal. Pupils are equal, round, and reactive to light. No scleral icterus.  Neck: Normal range of motion. Neck supple. No JVD present.  Cardiovascular: Normal rate, regular rhythm, S1 normal and S2 normal.  Exam reveals no friction rub.   No murmur heard. Pulses:  Radial pulses are 2+ on the right side, and 2+  on the left side.       Dorsalis pedis pulses are 2+ on the right side, and 2+ on the left side.  Respiratory: Effort normal and breath sounds normal. He has no wheezes. He has no rales.  GI: Soft. Bowel sounds are normal. He exhibits no distension. There is no tenderness.  Musculoskeletal: He exhibits no edema.  Lymphadenopathy:    He has no cervical adenopathy.  Neurological: He is alert and oriented to person, place, and time. He exhibits normal muscle tone.  Skin: Skin is warm and dry.  Psychiatric: He has a normal mood and affect.     Assessment/Plan  SVT Reported by EMS.  We have not seen any EKG documenting it.  Likely the cause of his troponin elevation.  Admit and observe overnight.  If nothing on telemetry, 30 day HR monitor.  BP will not support BB.  May need ablation.   Elevated Troponin Likely demand ischemia from SVT.  BP on the soft side. Treadmill cardiolite tomorrow.  Check lipids. Cycle troponin, TSH.    Tobacco abuse  150PY.  Quit 5 months ago.   Tarri Fuller, [place] 05/22/2015, 2:19 PM    Agree with note written by Luisa Dago PAC  Pt being admitted with SVT, pre syncope, and brief burning SSCP relieved with MAAlox. CRF > 150 PY tob, quit 5 months ago. No prior cardiac Hx. Apparently had SVT which broke with adenosine. Trop mildly elevated at .09. EKG w/o acute changes. Agree admit to tele. Cycle enz. 2D and exercise MV tomorrow.   Quay Burow 05/22/2015 3:01 PM

## 2015-05-22 NOTE — Progress Notes (Addendum)
CRITICAL VALUE ALERT  Critical value received:  Troponin 1.86  Date of notification:  05/22/2015  Time of notification:  1955  Critical value read back:Yes.    Nurse who received alert:  Bard Herbert  MD notified (1st page):  Philbert Riser  Time of first page:  2005  MD notified (2nd page):  Time of second page:  Responding MD:  Philbert Riser  Time MD responded:  2009

## 2015-05-23 ENCOUNTER — Observation Stay (HOSPITAL_COMMUNITY): Payer: Medicare Other

## 2015-05-23 ENCOUNTER — Other Ambulatory Visit: Payer: Self-pay | Admitting: Student

## 2015-05-23 ENCOUNTER — Encounter (HOSPITAL_COMMUNITY): Payer: Self-pay | Admitting: Student

## 2015-05-23 DIAGNOSIS — R778 Other specified abnormalities of plasma proteins: Secondary | ICD-10-CM

## 2015-05-23 DIAGNOSIS — Z7982 Long term (current) use of aspirin: Secondary | ICD-10-CM | POA: Diagnosis not present

## 2015-05-23 DIAGNOSIS — R42 Dizziness and giddiness: Secondary | ICD-10-CM

## 2015-05-23 DIAGNOSIS — R7989 Other specified abnormal findings of blood chemistry: Secondary | ICD-10-CM

## 2015-05-23 DIAGNOSIS — Z87891 Personal history of nicotine dependence: Secondary | ICD-10-CM | POA: Diagnosis not present

## 2015-05-23 DIAGNOSIS — I471 Supraventricular tachycardia: Secondary | ICD-10-CM | POA: Diagnosis not present

## 2015-05-23 DIAGNOSIS — I10 Essential (primary) hypertension: Secondary | ICD-10-CM | POA: Diagnosis not present

## 2015-05-23 LAB — NM MYOCAR MULTI W/SPECT W/WALL MOTION / EF
CSEPEDS: 29 s
CSEPEW: 1 METS
CSEPPHR: 134 {beats}/min
Exercise duration (min): 5 min
Rest HR: 82 {beats}/min

## 2015-05-23 LAB — LIPID PANEL
CHOLESTEROL: 113 mg/dL (ref 0–200)
HDL: 21 mg/dL — ABNORMAL LOW (ref >40)
LDL CALC: 63 mg/dL (ref 0–99)
Total CHOL/HDL Ratio: 5.4 RATIO
Triglycerides: 143 mg/dL (ref <150)
VLDL: 29 mg/dL (ref 0–40)

## 2015-05-23 LAB — BASIC METABOLIC PANEL
Anion gap: 6 (ref 5–15)
BUN: 17 mg/dL (ref 6–20)
CO2: 25 mmol/L (ref 22–32)
CREATININE: 1.05 mg/dL (ref 0.61–1.24)
Calcium: 8.1 mg/dL — ABNORMAL LOW (ref 8.9–10.3)
Chloride: 103 mmol/L (ref 101–111)
GFR calc Af Amer: >60 mL/min (ref >60)
GLUCOSE: 110 mg/dL — AB (ref 65–99)
POTASSIUM: 4.1 mmol/L (ref 3.5–5.1)
SODIUM: 134 mmol/L — AB (ref 135–145)

## 2015-05-23 LAB — CBC
HCT: 34.2 % — ABNORMAL LOW (ref 39.0–52.0)
Hemoglobin: 11.5 g/dL — ABNORMAL LOW (ref 13.0–17.0)
MCH: 28.3 pg (ref 26.0–34.0)
MCHC: 33.6 g/dL (ref 30.0–36.0)
MCV: 84 fL (ref 78.0–100.0)
PLATELETS: 238 10*3/uL (ref 150–400)
RBC: 4.07 MIL/uL — AB (ref 4.22–5.81)
RDW: 14 % (ref 11.5–15.5)
WBC: 6.2 10*3/uL (ref 4.0–10.5)

## 2015-05-23 LAB — HEMOGLOBIN A1C
HEMOGLOBIN A1C: 5.5 % (ref 4.8–5.6)
MEAN PLASMA GLUCOSE: 111 mg/dL

## 2015-05-23 LAB — HEPARIN LEVEL (UNFRACTIONATED): Heparin Unfractionated: 0.27 IU/mL — ABNORMAL LOW (ref 0.30–0.70)

## 2015-05-23 LAB — TROPONIN I: Troponin I: 1.52 ng/mL (ref <0.031)

## 2015-05-23 MED ORDER — TECHNETIUM TC 99M SESTAMIBI GENERIC - CARDIOLITE
10.0000 | Freq: Once | INTRAVENOUS | Status: AC | PRN
  Administered 2015-05-23: 10 via INTRAVENOUS

## 2015-05-23 MED ORDER — TECHNETIUM TC 99M SESTAMIBI GENERIC - CARDIOLITE
30.0000 | Freq: Once | INTRAVENOUS | Status: AC | PRN
  Administered 2015-05-23: 30 via INTRAVENOUS

## 2015-05-23 MED ORDER — METOPROLOL TARTRATE 25 MG PO TABS: 12.5000 mg | ORAL_TABLET | Freq: Two times a day (BID) | ORAL | Status: DC

## 2015-05-23 MED ORDER — REGADENOSON 0.4 MG/5ML IV SOLN
0.4000 mg | Freq: Once | INTRAVENOUS | Status: AC
  Administered 2015-05-23: 0.4 mg via INTRAVENOUS
  Filled 2015-05-23: qty 5

## 2015-05-23 MED ORDER — REGADENOSON 0.4 MG/5ML IV SOLN
INTRAVENOUS | Status: AC
  Filled 2015-05-23: qty 5

## 2015-05-23 MED ORDER — ASPIRIN 81 MG PO TBEC: 81.0000 mg | DELAYED_RELEASE_TABLET | Freq: Every day | ORAL | Status: DC

## 2015-05-23 NOTE — Progress Notes (Signed)
Latest troponin was 2.01 at 2230.  Paged Dr Philbert Riser to advise per earlier conversation.Patient resting comfortably, no pain or distress

## 2015-05-23 NOTE — Progress Notes (Signed)
Patient Name: George [name] Date of Encounter: 05/23/2015   SUBJECTIVE  Seen in nuc med. No CP, sob or palpitation.  CURRENT MEDS . aspirin EC  81 mg Oral Daily  . regadenoson        OBJECTIVE  Filed Vitals:   05/23/15 0933 05/23/15 0940 05/23/15 0942 05/23/15 0944  BP: 153/89 159/89 151/82 145/79  Pulse:      Temp:      TempSrc:      Resp:      Height:      Weight:      SpO2:        Intake/Output Summary (Last 24 hours) at 05/23/15 0946 Last data filed at 05/23/15 0747  Gross per 24 hour  Intake      0 ml  Output   2050 ml  Net  -2050 ml   Filed Weights   05/22/15 0949  Weight: 235 lb (106.595 kg)    PHYSICAL EXAM  General: Pleasant, NAD. Neuro: Alert and oriented X 3. Moves all extremities spontaneously. Psych: Normal affect. HEENT:  Normal  Neck: Supple without bruits or JVD. Lungs:  Resp regular and unlabored. Inspiratory course breath sound.  Heart: RRR no s3, s4, or murmurs. Abdomen: Soft, non-tender, non-distended, BS + x 4.  Extremities: No clubbing, cyanosis or edema. DP/PT/Radials 2+ and equal bilaterally.  Accessory Clinical Findings  CBC  Recent Labs  05/22/15 1003 05/23/15 0424  WBC 10.3 6.2  HGB 14.8 11.5*  HCT 44.5 34.2*  MCV 84.3 84.0  PLT 304 462   Basic Metabolic Panel  Recent Labs  05/22/15 1003 05/23/15 0424  NA 135 134*  K 5.3* 4.1  CL 100* 103  CO2 26 25  GLUCOSE 133* 110*  BUN 22* 17  CREATININE 1.65* 1.05  CALCIUM 8.4* 8.1*   Liver Function Tests  Recent Labs  05/22/15 1003  AST 30  ALT 19  ALKPHOS 53  BILITOT 1.1  PROT 5.5*  ALBUMIN 2.9*    Recent Labs  05/22/15 1003  LIPASE 38   Cardiac Enzymes  Recent Labs  05/22/15 1835 05/22/15 2230 05/23/15 0424  TROPONINI 1.86* 2.01* 1.52*   BNP Invalid input(s): POCBNP D-Dimer No results for input(s): DDIMER in the last 72 hours. Hemoglobin A1C  Recent Labs  05/22/15 1835  HGBA1C 5.5   Fasting Lipid Panel  Recent Labs  05/23/15 0424  CHOL 113  HDL 21*  LDLCALC 63  TRIG 143  CHOLHDL 5.4   Thyroid Function Tests  Recent Labs  05/22/15 1835  TSH 3.870    TELE  Seen in nuc med.   Radiology/Studies  Ct Angio Chest Pe W/cm &/or Wo Cm  05/22/2015  CLINICAL DATA:  Difficulty breathing and tachycardia EXAM: CT ANGIOGRAPHY CHEST WITH CONTRAST TECHNIQUE: Multidetector CT imaging of the chest was performed using the standard protocol during bolus administration of intravenous contrast. Multiplanar CT image reconstructions and MIPs were obtained to evaluate the vascular anatomy. CONTRAST:  79mL OMNIPAQUE IOHEXOL 350 MG/ML SOLN COMPARISON:  Chest radiograph May 22, 2015 FINDINGS: There is no demonstrable pulmonary embolus. There is no thoracic aortic aneurysm. The visualized great vessels appear unremarkable. A small amount of atherosclerotic calcification is noted in the aorta. There also scattered foci of coronary artery calcification. Pericardium is not thickened. There are small scattered bullae throughout both upper lobes, more on the right than on the left. On axial slice 38 series 5, there is a 5 mm nodular opacity in the anterior segment right upper  lobe. There is patchy bibasilar atelectatic change with minimal pleural effusions bilaterally. There is interstitial edema. Areas of mild patchy ground-glass opacity are felt to be consistent with edema. There is no consolidation suggesting focal pneumonia. Thyroid appears unremarkable. There are small scattered mediastinal lymph nodes throughout the chest. There is a lymph node in the right hilar region measuring 1.9 x 1.3 cm. There is a lymph node in the left hilum measuring 1.5 x 1.2 cm. There is a small hiatal hernia. There is an intramuscular lipoma lateral to the cervical-thoracic junction on the left, benign in appearance. This lipoma measures approximately 3.5 x 1.5 cm. In the visualized upper abdomen, no lesions are identified. There are no blastic or  lytic bone lesions. Review of the MIP images confirms the above findings. IMPRESSION: No demonstrable pulmonary embolus. Bibasilar atelectatic change. Mild interstitial edema is present. There is no appreciable alveolar consolidation. On axial slice 38 series 5, there is a 5 mm nodular opacity in the anterior segment of the right upper lobe. Followup of this nodular opacity should be based on Fleischner Society guidelines. If the patient is at high risk for bronchogenic carcinoma, follow-up chest CT at 6-12 months is recommended. If the patient is at low risk for bronchogenic carcinoma, follow-up chest CT at 12 months is recommended. This recommendation follows the consensus statement: Guidelines for Management of Small Pulmonary Nodules Detected on CT Scans: A Statement from the [place]  as published in Radiology 2005;237:395-400. Multiple small mediastinal lymph nodes. Borderline prominent bilateral hilar lymph nodes identified, likely of reactive etiology. Scattered foci of coronary artery calcification. Small hiatal hernia. Electronically Signed   By: Lowella Grip III M.D.   On: 05/22/2015 12:31   Dg Chest Port 1 View  05/22/2015  CLINICAL DATA:  Chest pain.  Syncope. EXAM: PORTABLE CHEST 1 VIEW COMPARISON:  08/23/2008 FINDINGS: Mild left lower lobe infiltrate, suspicious for pneumonia. Possible small area of atelectasis versus infiltrate in the right lung base Negative for heart failure.  No pleural effusion. IMPRESSION: Left lower lobe infiltrate, possible pneumonia. Mild right lower lobe atelectasis/ infiltrate. Electronically Signed   By: Franchot Gallo M.D.   On: 05/22/2015 10:38    ASSESSMENT AND PLAN Active Problems:   SVT (supraventricular tachycardia) (HCC)   Elevated troponin level   Plan: Unable to review tele as patient was seen in Nuclear medicine. Troponin trend 1.86->2.01->1.52. No CP. Lexiscan result to follow.   Signed, Bhagat,Bhavinkumar PA-C   Agree with note  by Robbie Lis PA-C  Will review tele. MV images neg. Trop peaked at 2. Can prob be D/Cd on BB, 30 day event monitor.  Lorretta Harp, M.D., [place], [facility], [place] [address] [place], [place]  [id number]  [phone] 05/23/2015 2:02 PM

## 2015-05-23 NOTE — Progress Notes (Signed)
Patient presented for Lexiscan. Tolerated procedure well. Result to follow.   Peace Jost, PAC 

## 2015-05-23 NOTE — Progress Notes (Signed)
ANTICOAGULATION CONSULT NOTE - Follow Up Consult  Pharmacy Consult for heparin Indication: chest pain/ACS  Labs:  Recent Labs  05/22/15 1003 05/22/15 1835 05/22/15 2005 05/22/15 2230 05/23/15 0424  HGB 14.8  --   --   --  11.5*  HCT 44.5  --   --   --  34.2*  PLT 304  --   --   --  238  HEPARINUNFRC  --   --  0.19*  --  0.27*  CREATININE 1.65*  --   --   --   --   TROPONINI  --  1.86*  --  2.01*  --     Assessment: 64yo male remains slightly subtherapeutic on heparin after rate change.  Goal of Therapy:  Heparin level 0.3-0.7 units/ml   Plan:  Will increase heparin gtt by 1-2 units/kg/hr to 1600 units/hr and check level in 6hr.  Wynona Neat, PharmD, BCPS  05/23/2015,5:28 AM

## 2015-05-23 NOTE — Progress Notes (Signed)
Patient sleeping, call light within reach

## 2015-05-23 NOTE — Discharge Summary (Signed)
CARDIOLOGY DISCHARGE SUMMARY   Patient ID: George [name] MRN: [id number] DOB/AGE: 66/07/1951 66 y.o.  Admit date: 05/22/2015 Discharge date: 05/23/2015  PCP: [name], [place] Primary Cardiologist: New - Dr. [name]  Primary Discharge Diagnosis:  Supraventricular Tachycardia Secondary Discharge Diagnosis: Elevated troponin level  Consults: None  Procedures: Myocardial [facility] Course: [name] is a 64 y.o. male with past medical history of tobacco abuse (150 pack-years, quit 5 months ago) and venous insufficiency who presented to [name] ED on 05/22/2015 for feeling lightheaded. He also had a burning sensation in his chest which was relieved with Maalox.  It was noted by EMS the patient had been in SVT, yet it was not seen on telemetry while at the hospital. He converted in the field with administration of adenosine. CTA was obtained which showed no evidence of PE but did show a 5 mm nodular opacity in the anterior segment of the right upper lobe. Follow-up chest CT in 12 months was recommended. I-stat troponin was positive at 0.14. Cyclic troponin values were 1.82, 2.01, and 1.52. It was thought this troponin rise was likely secondary to the SVT the patient experienced. Heparin was started and an nuclear stress test was planned for the following morning.  Overnight, he had no repeat chest pain or lightheadedness. Stress testing was performed and showed EF of 65 % and inferior wall attenuation on stress and rest imaging thought to be due to diaphragmatic attenuation given the lack of abnormal wall motion in this vicinity. No inducible ischemia was noted and the stress test was a low-risk study.  The patient's labs and vitals were reviewed. Telemetry showed no recurrence of SVT. The patient was last examined by Dr. [name] and deemed stable for discharge. He was started on a low-dose BB at discharge (Metoprolol 12.5mg  BID) and is scheduled to receive a  30-day event monitor on 05/24/2015. He has scheduled follow-up with Dr. [name] on 07/11/2015 to discuss the findings of his event monitor. Will need to follow-up with his PCP in 12 months for right upper lung nodular opacity.  Labs:  Lab Results  Component Value Date   WBC 6.2 05/23/2015   HGB 11.5* 05/23/2015   HCT 34.2* 05/23/2015   MCV 84.0 05/23/2015   PLT 238 05/23/2015     Recent Labs Lab 05/22/15 1003 05/23/15 0424  NA 135 134*  K 5.3* 4.1  CL 100* 103  CO2 26 25  BUN 22* 17  CREATININE 1.65* 1.05  CALCIUM 8.4* 8.1*  PROT 5.5*  --   BILITOT 1.1  --   ALKPHOS 53  --   ALT 19  --   AST 30  --   GLUCOSE 133* 110*    Recent Labs  05/22/15 1835 05/22/15 2230 05/23/15 0424  TROPONINI 1.86* 2.01* 1.52*   Lipid Panel     Component Value Date/Time   CHOL 113 05/23/2015 0424   TRIG 143 05/23/2015 0424   HDL 21* 05/23/2015 0424   CHOLHDL 5.4 05/23/2015 0424   VLDL 29 05/23/2015 0424   LDLCALC 63 05/23/2015 0424       Radiology: Ct Angio Chest Pe W/cm &/or Wo Cm: 05/22/2015  CLINICAL DATA:  Difficulty breathing and tachycardia EXAM: CT ANGIOGRAPHY CHEST WITH CONTRAST TECHNIQUE: Multidetector CT imaging of the chest was performed using the standard protocol during bolus administration of intravenous contrast. Multiplanar CT image reconstructions and MIPs were obtained to evaluate the vascular anatomy. CONTRAST:  40mL OMNIPAQUE  IOHEXOL 350 MG/ML SOLN COMPARISON:  Chest radiograph May 22, 2015 FINDINGS: There is no demonstrable pulmonary embolus. There is no thoracic aortic aneurysm. The visualized great vessels appear unremarkable. A small amount of atherosclerotic calcification is noted in the aorta. There also scattered foci of coronary artery calcification. Pericardium is not thickened. There are small scattered bullae throughout both upper lobes, more on the right than on the left. On axial slice 38 series 5, there is a 5 mm nodular opacity in the anterior segment  right upper lobe. There is patchy bibasilar atelectatic change with minimal pleural effusions bilaterally. There is interstitial edema. Areas of mild patchy ground-glass opacity are felt to be consistent with edema. There is no consolidation suggesting focal pneumonia. Thyroid appears unremarkable. There are small scattered mediastinal lymph nodes throughout the chest. There is a lymph node in the right hilar region measuring 1.9 x 1.3 cm. There is a lymph node in the left hilum measuring 1.5 x 1.2 cm. There is a small hiatal hernia. There is an intramuscular lipoma lateral to the cervical-thoracic junction on the left, benign in appearance. This lipoma measures approximately 3.5 x 1.5 cm. In the visualized upper abdomen, no lesions are identified. There are no blastic or lytic bone lesions. Review of the MIP images confirms the above findings. IMPRESSION: No demonstrable pulmonary embolus. Bibasilar atelectatic change. Mild interstitial edema is present. There is no appreciable alveolar consolidation. On axial slice 38 series 5, there is a 5 mm nodular opacity in the anterior segment of the right upper lobe. Followup of this nodular opacity should be based on Fleischner Society guidelines. If the patient is at high risk for bronchogenic carcinoma, follow-up chest CT at 6-12 months is recommended. If the patient is at low risk for bronchogenic carcinoma, follow-up chest CT at 12 months is recommended. This recommendation follows the consensus statement: Guidelines for Management of Small Pulmonary Nodules Detected on CT Scans: A Statement from the [place] as published in Radiology 2005;237:395-400. Multiple small mediastinal lymph nodes. Borderline prominent bilateral hilar lymph nodes identified, likely of reactive etiology. Scattered foci of coronary artery calcification. Small hiatal hernia. Electronically Signed   By: Lowella Grip III M.D.   On: 05/22/2015 12:31   Nm Myocar Multi W/spect W/wall  Motion / Ef: 05/23/2015  CLINICAL DATA:  Syncope. Elevated troponin. Tobacco use. Supraventricular tachycardia. EXAM: MYOCARDIAL IMAGING WITH SPECT (REST AND PHARMACOLOGIC-STRESS) GATED LEFT VENTRICULAR WALL MOTION STUDY LEFT VENTRICULAR EJECTION FRACTION TECHNIQUE: Standard myocardial SPECT imaging was performed after resting intravenous injection of 10 mCi Tc-57m sestamibi. Subsequently, intravenous infusion of Lexiscan was performed under the supervision of the Cardiology staff. At peak effect of the drug, 30 mCi Tc-44m sestamibi was injected intravenously and standard myocardial SPECT imaging was performed. Quantitative gated imaging was also performed to evaluate left ventricular wall motion, and estimate left ventricular ejection fraction. COMPARISON:  05/22/2015 FINDINGS: Perfusion: Inferior wall attenuation on stress than rest imaging with the potentially from diaphragmatic attenuation or scar, large size mild-to-moderate severity. No inducible ischemia. Apical thinning noted. Wall Motion: Normal left ventricular wall motion. No left ventricular dilation. Left Ventricular Ejection Fraction: 65 % End diastolic volume 619 ml End systolic volume 41 ml IMPRESSION: 1. Inferior wall attenuation on stress and rest imaging probably due to diaphragmatic attenuation given the lack of abnormal wall motion in this vicinity. Scarring of the inferior wall is a differential diagnostic consideration. No inducible ischemia. 2. Normal left ventricular wall motion. 3. Left ventricular ejection fraction 65% 4. Low-risk  stress test findings*. *2012 Appropriate Use Criteria for Coronary Revascularization Focused Update: J Am Coll Cardiol. 0973;53(2):992-426. http://content.airportbarriers.com.aspx?articleid=[email or web address] Electronically Signed   By: Van Clines M.D.   On: 05/23/2015 15:13   Dg Chest Port 1 View: 05/22/2015  CLINICAL DATA:  Chest pain.  Syncope. EXAM: PORTABLE CHEST 1 VIEW COMPARISON:  08/23/2008 FINDINGS:  Mild left lower lobe infiltrate, suspicious for pneumonia. Possible small area of atelectasis versus infiltrate in the right lung base Negative for heart failure.  No pleural effusion. IMPRESSION: Left lower lobe infiltrate, possible pneumonia. Mild right lower lobe atelectasis/ infiltrate. Electronically Signed   By: Franchot Gallo M.D.   On: 05/22/2015 10:38    FOLLOW UP PLANS AND APPOINTMENTS No Known Allergies   Medication List    STOP taking these medications        BC HEADACHE POWDER PO      TAKE these medications        aspirin 81 MG EC tablet  Take 1 tablet (81 mg total) by mouth daily.     metoprolol tartrate 25 MG tablet  Commonly known as:  LOPRESSOR  Take 0.5 tablets (12.5 mg total) by mouth 2 (two) times daily.         Follow-up Information    Follow up with [facility] On 05/24/2015.   Why:  Cardiology Appointment to pick up Event Monitor on 05/24/2015 at 11:00AM   Contact information:   [place] [phone]       Follow up with Quay Burow, MD On 07/11/2015.   Specialties:  Cardiology, Radiology   Why:  Cardiology follow-up appointment on 07/11/2015 at 11:30AM.   Contact information:   [address] [address] [phone]       BRING ALL MEDICATIONS WITH YOU TO FOLLOW UP APPOINTMENTS  Time spent with patient to include physician time: 35 minutes Signed: Erma Heritage, PA 05/23/2015, 3:35 PM Co-Sign MD

## 2015-05-23 NOTE — Progress Notes (Signed)
Heparin changed to 16.0 per order from Pharmacy

## 2015-05-24 ENCOUNTER — Telehealth: Payer: Self-pay | Admitting: Cardiovascular Disease

## 2015-05-24 ENCOUNTER — Telehealth: Payer: Self-pay | Admitting: *Deleted

## 2015-05-24 ENCOUNTER — Ambulatory Visit (INDEPENDENT_AMBULATORY_CARE_PROVIDER_SITE_OTHER): Payer: Medicare Other

## 2015-05-24 ENCOUNTER — Other Ambulatory Visit: Payer: Self-pay | Admitting: Plastic Surgery

## 2015-05-24 DIAGNOSIS — R42 Dizziness and giddiness: Secondary | ICD-10-CM | POA: Diagnosis not present

## 2015-05-24 DIAGNOSIS — C44310 Basal cell carcinoma of skin of unspecified parts of face: Secondary | ICD-10-CM

## 2015-05-24 DIAGNOSIS — I471 Supraventricular tachycardia: Secondary | ICD-10-CM | POA: Diagnosis not present

## 2015-05-24 NOTE — Telephone Encounter (Signed)
Pt was on TCM list d/c 05/23/15 had myocardial lexi scan. Pt will be f/u with cardiology...Johny Chess

## 2015-05-24 NOTE — Telephone Encounter (Signed)
D/C Phone Call . Appt is on 07/11/15 at 11:30am w/ Dr. [name]   Thanks

## 2015-05-24 NOTE — Telephone Encounter (Signed)
Patient contacted regarding discharge from [facility] on 05/23/15.  Patient understands to follow up with provider Quay Burow, MD on 07/11/15 at 11:30AM at [facility]. Patient understands discharge instructions? yes Patient understands medications and regiment? yes Patient understands to bring all medications to this visit? yes  Pt had no questions.

## 2015-05-29 ENCOUNTER — Encounter (HOSPITAL_COMMUNITY): Payer: Self-pay | Admitting: Emergency Medicine

## 2015-05-29 NOTE — Progress Notes (Signed)
Anesthesia Chart Review:  Pt is 64 year old male scheduled for forehead flap to nasal, R lower lid defect 4x4 cm on 05/30/2015 with Dr. Marla Roe.   Pt is a same day work up  [place] includes: SVT, GERD. Former smoker. BMI 29.  Pt hospitalized 10/11-10/12/16 for SVT. Pt was having chest pain and palpitations, called EMS. EMS found pt in SVT and converted with adenosine in the field. No SVT while in hospital. Pt currently wearing 30 day cardiac event monitor.   Medications include: ASA, metoprolol.   Labs 05/23/15: CBC, BMET acceptable for surgery  EKG 05/23/2015: NSR. Possible Left atrial enlargement  Nuclear stress test 05/23/2015:  1. Inferior wall attenuation on stress and rest imaging probably due to diaphragmatic attenuation given the lack of abnormal wall motion in this vicinity. Scarring of the inferior wall is a differential diagnostic consideration. No inducible ischemia. 2. Normal left ventricular wall motion. 3. Left ventricular ejection fraction 65% 4. Low-risk stress test findings  If no changes, I anticipate pt can proceed with surgery as scheduled.   Willeen Cass, FNP-BC [facility]/Anesthesiology Phone: [phone] 05/29/2015 12:42 PM

## 2015-05-30 ENCOUNTER — Encounter (HOSPITAL_COMMUNITY): Admission: RE | Payer: Self-pay | Source: Ambulatory Visit

## 2015-05-30 ENCOUNTER — Ambulatory Visit (HOSPITAL_COMMUNITY): Admission: RE | Admit: 2015-05-30 | Payer: Medicare Other | Source: Ambulatory Visit | Admitting: Plastic Surgery

## 2015-05-30 SURGERY — CREATION, FLAP, PEDICLED ROTATION, FOREHEAD AND NOSE
Anesthesia: General

## 2015-07-02 ENCOUNTER — Telehealth: Payer: Self-pay | Admitting: Cardiovascular Disease

## 2015-07-02 NOTE — Telephone Encounter (Signed)
Left message to call back  

## 2015-07-02 NOTE — Telephone Encounter (Signed)
Returning your call. °

## 2015-07-11 ENCOUNTER — Encounter: Payer: Self-pay | Admitting: Cardiovascular Disease

## 2015-07-11 ENCOUNTER — Ambulatory Visit (INDEPENDENT_AMBULATORY_CARE_PROVIDER_SITE_OTHER): Payer: Medicare Other | Admitting: Cardiovascular Disease

## 2015-07-11 VITALS — BP 120/82 | HR 79 | Ht 76.0 in | Wt 232.0 lb

## 2015-07-11 DIAGNOSIS — I471 Supraventricular tachycardia: Secondary | ICD-10-CM | POA: Diagnosis not present

## 2015-07-11 MED ORDER — METOPROLOL TARTRATE 25 MG PO TABS: 12.5000 mg | ORAL_TABLET | Freq: Two times a day (BID) | ORAL | Status: DC

## 2015-07-11 NOTE — Assessment & Plan Note (Signed)
George [name] was seen in the emergency room on 05/22/15 with PSVT and chest pain. He converted with intravenous adenosine by EMS. He does have positive risk factors including greater than 150-pack-years of tobacco abuse having quit 6 months ago. He had mildly elevated troponins which led to a Myoview stress test which was low risk. He has had no recurrent chest pain or arrhythmias and was started on 30 day event monitoring.

## 2015-07-11 NOTE — Progress Notes (Signed)
     07/11/2015 George [name]   [birth year]  [id number]  Primary Physician George [name], [place] Primary Cardiologist: George Harp MD George [name]   HPI:  George [name] is a 64 year old thin-appearing divorced Caucasian male with no children is retired from doing [employer] work. He was recently seen at [facility] after he was admitted with PSVT and subsequent chest pain converting with intravenous adenosine. He had mildly elevated troponins which led to a Myoview stress test which was low risk. A CT scan to gram showed no evidence of pulmonary emboli but did show a 5 mm nodule which will need to be followed by repeat CT scanning to rule out malignancy given his tobacco abuse history. 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   Current Outpatient Prescriptions  Medication Sig Dispense Refill  . aspirin EC 81 MG EC tablet Take 1 tablet (81 mg total) by mouth daily.    . metoprolol tartrate (LOPRESSOR) 25 MG tablet Take 0.5 tablets (12.5 mg total) by mouth 2 (two) times daily. 180 tablet 3   No current facility-administered medications for this visit.    No Known Allergies  Social History   Social History  . Marital Status: Divorced    Spouse Name: N/A  . Number of Children: 0  . Years of Education: 12   Occupational History  . Retired    Social History Main Topics  . Smoking status: Former Smoker -- 2.00 packs/day for 50 years    Types: Cigarettes    Quit date: 01/24/2015  . Smokeless tobacco: Never Used  . Alcohol Use: No  . Drug Use: No  . Sexual Activity: Not Currently   Other Topics Concern  . Not on file   Social History Narrative   Fun: Trade out work, outdoors   Denies religious beliefs effecting health care.      Review of Systems: General: negative for chills, fever, night sweats or weight changes.  Cardiovascular: negative for chest pain, dyspnea on exertion, edema, orthopnea, palpitations,  paroxysmal nocturnal dyspnea or shortness of breath Dermatological: negative for rash Respiratory: negative for cough or wheezing Urologic: negative for hematuria Abdominal: negative for nausea, vomiting, diarrhea, bright red blood per rectum, melena, or hematemesis Neurologic: negative for visual changes, syncope, or dizziness All other systems reviewed and are otherwise negative except as noted above.    Blood pressure 120/82, pulse 79, height 6\' 4"  (1.93 m), weight 232 lb (105.235 kg).  General appearance: alert and no distress Neck: no adenopathy, no carotid bruit, no JVD, supple, symmetrical, trachea midline and thyroid not enlarged, symmetric, no tenderness/mass/nodules Lungs: clear to auscultation bilaterally Heart: regular rate and rhythm, S1, S2 normal, no murmur, click, rub or gallop Extremities: extremities normal, atraumatic, no cyanosis or edema  EKG not performed today  ASSESSMENT AND PLAN:   SVT (supraventricular tachycardia) [facility]) George [name] was seen in the emergency room on 05/22/15 with PSVT and chest pain. He converted with intravenous adenosine by EMS. He does have positive risk factors including greater than 150-pack-years of tobacco abuse having quit 6 months ago. He had mildly elevated troponins which led to a Myoview stress test which was low risk. He has had no recurrent chest pain or arrhythmias and was started on 30 day event monitoring.      George Harp MD FACP,FACC,FAHA, [facility] 07/11/2015 12:23 PM

## 2015-07-11 NOTE — Patient Instructions (Signed)

## 2015-07-12 NOTE — Progress Notes (Unsigned)
This encounter was created in error - please disregard.

## 2015-08-20 ENCOUNTER — Emergency Department
Admission: EM | Admit: 2015-08-20 | Discharge: 2015-08-20 | Discharge status: Against Medical Advice | Payer: Medicare Other | Attending: Internal Medicine | Admitting: Internal Medicine

## 2015-08-20 ENCOUNTER — Emergency Department: Payer: Medicare Other

## 2015-08-20 ENCOUNTER — Encounter: Payer: Self-pay | Admitting: Emergency Medicine

## 2015-08-20 DIAGNOSIS — Z8679 Personal history of other diseases of the circulatory system: Secondary | ICD-10-CM | POA: Insufficient documentation

## 2015-08-20 DIAGNOSIS — Z87891 Personal history of nicotine dependence: Secondary | ICD-10-CM | POA: Diagnosis not present

## 2015-08-20 DIAGNOSIS — J441 Chronic obstructive pulmonary disease with (acute) exacerbation: Secondary | ICD-10-CM | POA: Diagnosis not present

## 2015-08-20 DIAGNOSIS — Z7982 Long term (current) use of aspirin: Secondary | ICD-10-CM | POA: Insufficient documentation

## 2015-08-20 DIAGNOSIS — Z79899 Other long term (current) drug therapy: Secondary | ICD-10-CM | POA: Diagnosis not present

## 2015-08-20 DIAGNOSIS — I214 Non-ST elevation (NSTEMI) myocardial infarction: Secondary | ICD-10-CM | POA: Diagnosis not present

## 2015-08-20 DIAGNOSIS — R0602 Shortness of breath: Secondary | ICD-10-CM | POA: Diagnosis not present

## 2015-08-20 LAB — CBC
HCT: 36.1 % — ABNORMAL LOW (ref 40.0–52.0)
Hemoglobin: 11.9 g/dL — ABNORMAL LOW (ref 13.0–18.0)
MCH: 26.9 pg (ref 26.0–34.0)
MCHC: 33 g/dL (ref 32.0–36.0)
MCV: 81.6 fL (ref 80.0–100.0)
PLATELETS: 259 10*3/uL (ref 150–440)
RBC: 4.42 MIL/uL (ref 4.40–5.90)
RDW: 15.2 % — AB (ref 11.5–14.5)
WBC: 7.1 10*3/uL (ref 3.8–10.6)

## 2015-08-20 LAB — BASIC METABOLIC PANEL
Anion gap: 8 (ref 5–15)
BUN: 42 mg/dL — AB (ref 6–20)
CALCIUM: 8.2 mg/dL — AB (ref 8.9–10.3)
CO2: 25 mmol/L (ref 22–32)
CREATININE: 1.34 mg/dL — AB (ref 0.61–1.24)
Chloride: 107 mmol/L (ref 101–111)
GFR calc non Af Amer: 54 mL/min — ABNORMAL LOW (ref >60)
Glucose, Bld: 105 mg/dL — ABNORMAL HIGH (ref 65–99)
Potassium: 4.5 mmol/L (ref 3.5–5.1)
Sodium: 140 mmol/L (ref 135–145)

## 2015-08-20 LAB — TROPONIN I: TROPONIN I: 0.13 ng/mL — AB (ref <0.031)

## 2015-08-20 MED ORDER — IPRATROPIUM-ALBUTEROL 0.5-2.5 (3) MG/3ML IN SOLN
3.0000 mL | Freq: Once | RESPIRATORY_TRACT | Status: AC
  Administered 2015-08-20: 3 mL via RESPIRATORY_TRACT

## 2015-08-20 MED ORDER — IPRATROPIUM-ALBUTEROL 0.5-2.5 (3) MG/3ML IN SOLN
RESPIRATORY_TRACT | Status: AC
  Filled 2015-08-20: qty 3

## 2015-08-20 MED ORDER — ALBUTEROL SULFATE (2.5 MG/3ML) 0.083% IN NEBU: 2.5000 mg | INHALATION_SOLUTION | Freq: Once | RESPIRATORY_TRACT | Status: DC

## 2015-08-20 MED ORDER — METHYLPREDNISOLONE SODIUM SUCC 125 MG IJ SOLR
125.0000 mg | Freq: Once | INTRAMUSCULAR | Status: AC
  Administered 2015-08-20: 125 mg via INTRAVENOUS
  Filled 2015-08-20: qty 2

## 2015-08-20 MED ORDER — ENOXAPARIN SODIUM 120 MG/0.8ML ~~LOC~~ SOLN
1.0000 mg/kg | Freq: Once | SUBCUTANEOUS | Status: AC
  Administered 2015-08-20: 105 mg via SUBCUTANEOUS
  Filled 2015-08-20: qty 0.8

## 2015-08-20 MED ORDER — IPRATROPIUM-ALBUTEROL 0.5-2.5 (3) MG/3ML IN SOLN: 3.0000 mL | Freq: Once | RESPIRATORY_TRACT | Status: DC

## 2015-08-20 NOTE — ED Notes (Signed)
Yesterday began having dizziness, weakness, sob. Denies cp, appears pale.

## 2015-08-20 NOTE — ED Provider Notes (Addendum)
[facility] Emergency Department Provider Note  ____________________________________________  Time seen: Noon  I have reviewed the triage vital signs and the nursing notes.   HISTORY  Chief Complaint Dizziness; Weakness; and Shortness of Breath    HPI George [name] is a 65 y.o. male presents with complaints of shortness of breath. He reports this started yesterday after moving his recycling bin to the street. He felt winded on the walk back to his house. He denies chest pain. No recent travel. No fevers chills or cough. He does report a long history of smoking and quit 6 months ago. He has never had shortness of breath before. He reports his breathing is not affected by lying flat. No calf pain or swelling.     Past Medical History  Diagnosis Date  . Basal cell carcinoma of right side of nose     "haven't had it removed yet" (05/22/2015)  . SVT (supraventricular tachycardia) ([place]) 05/22/2015  . GERD (gastroesophageal reflux disease)     Patient Active Problem List   Diagnosis Date Noted  . Elevated troponin level 05/23/2015  . SVT (supraventricular tachycardia) ([place]) 05/22/2015  . Medicare annual wellness visit, initial 01/31/2015  . Cellulitis of leg, left 12/19/2014    Past Surgical History  Procedure Laterality Date  . Fracture surgery    . Tonsillectomy and adenoidectomy  ~ 1955  . Knee arthroscopy Right ~ 1985  . Femur fracture surgery Right 1970    "had plate & pin put in"  . Femur hardware removal Right 0970    "removed plate; left pin in    Current Outpatient Rx  Name  Route  Sig  Dispense  Refill  . aspirin EC 81 MG EC tablet   Oral   Take 1 tablet (81 mg total) by mouth daily.         . metoprolol tartrate (LOPRESSOR) 25 MG tablet   Oral   Take 0.5 tablets (12.5 mg total) by mouth 2 (two) times daily.   180 tablet   3     Allergies Review of patient's allergies indicates no known allergies.  Family History  Problem  Relation Age of Onset  . Heart disease Mother     Before age 7  . Heart failure Father   . Heart disease Father     After age 42  . Healthy Maternal Grandmother   . Healthy Maternal Grandfather   . Diabetes Paternal Grandmother   . Healthy Paternal Grandfather     Social History Social History  Substance Use Topics  . Smoking status: Former Smoker -- 2.00 packs/day for 50 years    Types: Cigarettes    Quit date: 01/24/2015  . Smokeless tobacco: Never Used  . Alcohol Use: No    Review of Systems  Constitutional: Negative for fever. Eyes: Negative for visual changes. ENT: Negative for sore throat Cardiovascular: Negative for chest pain. Respiratory: Positive for shortness of breath Gastrointestinal: Negative for abdominal pain, Genitourinary: Negative for dysuria. Musculoskeletal: Negative for back pain. Skin: Negative for rash. Neurological: Negative for headaches  Psychiatric: No anxiety    ____________________________________________   PHYSICAL EXAM:  VITAL SIGNS: ED Triage Vitals  Enc Vitals Group     BP 08/20/15 1107 143/86 mmHg     Pulse Rate 08/20/15 1107 85     Resp 08/20/15 1107 26     Temp 08/20/15 1107 97.7 F (36.5 C)     Temp Source 08/20/15 1107 Oral     SpO2  08/20/15 1107 96 %     Weight 08/20/15 1106 235 lb (106.595 kg)     Height 08/20/15 1106 6\' 4"  (1.93 m)     Head Cir --      Peak Flow --      Pain Score 08/20/15 1106 0     Pain Loc --      Pain Edu? --      Excl. in [place]? --      Constitutional: Alert and oriented. Well appearing and in no distress. Eyes: Conjunctivae are normal.  ENT   Head: Normocephalic and atraumatic.   Mouth/Throat: Mucous membranes are moist. Cardiovascular: Normal rate, regular rhythm. Normal and symmetric distal pulses are present in all extremities. No murmurs, rubs, or gallops. Respiratory: Normal respiratory effort without tachypnea nor retractions. Significant wheezes  bilaterally Gastrointestinal: Soft and non-tender in all quadrants. No distention. There is no CVA tenderness. Genitourinary: deferred Musculoskeletal: Nontender with normal range of motion in all extremities. No lower extremity tenderness nor edema. Neurologic:  Normal speech and language. No gross focal neurologic deficits are appreciated. Skin:  Skin is warm, dry and intact. No rash noted. Psychiatric: Mood and affect are normal. Patient exhibits appropriate insight and judgment.  ____________________________________________    LABS (pertinent positives/negatives)  Labs Reviewed  BASIC METABOLIC PANEL - Abnormal; Notable for the following:    Glucose, Bld 105 (*)    BUN 42 (*)    Creatinine, Ser 1.34 (*)    Calcium 8.2 (*)    GFR calc non Af Amer 54 (*)    All other components within normal limits  CBC - Abnormal; Notable for the following:    Hemoglobin 11.9 (*)    HCT 36.1 (*)    RDW 15.2 (*)    All other components within normal limits  TROPONIN I - Abnormal; Notable for the following:    Troponin I 0.13 (*)    All other components within normal limits    ____________________________________________   EKG  ED ECG REPORT I, Lavonia Drafts, the attending physician, personally viewed and interpreted this ECG.  Date: 08/20/2015 EKG Time: 11 AM Rate: 83 Rhythm: normal sinus rhythm QRS Axis: normal Intervals: normal ST/T Wave abnormalities: normal Conduction Disutrbances: none Narrative Interpretation: unremarkable   ____________________________________________    RADIOLOGY I have personally reviewed any xrays that were ordered on this patient: No acute distress  ____________________________________________   PROCEDURES  Procedure(s) performed: none  Critical Care performed: yes  CRITICAL CARE Performed by: Lavonia Drafts   Total critical care time: 30 minutes  Critical care time was exclusive of separately billable procedures and treating other  patients.  Critical care was necessary to treat or prevent imminent or life-threatening deterioration.  Critical care was time spent personally by me on the following activities: development of treatment plan with patient and/or surrogate as well as nursing, discussions with consultants, evaluation of patient's response to treatment, examination of patient, obtaining history from patient or surrogate, ordering and performing treatments and interventions, ordering and review of laboratory studies, ordering and review of radiographic studies, pulse oximetry and re-evaluation of patient's condition.   ____________________________________________   INITIAL IMPRESSION / ASSESSMENT AND PLAN / ED COURSE  Pertinent labs & imaging results that were available during my care of the patient were reviewed by me and considered in my medical decision making (see chart for details).  Patient presents with shortness of breath that started about a cold pushing his recycling bin yesterday. He denies chest pain. His lung  exam is consistent with COPD exacerbation although he has no history of COPD. We will treat with steroids and a DuoNeb  Called by lab and notified of elevated troponin. Again verified the patient has no chest pain. We will give aspirin 324 and Lovenox and admitted to the hospital service  ----------------------------------------- 3:06 PM on 08/20/2015 -----------------------------------------  Patient declined admission to the hospitalist. I discussed this with him and he states that he feels significant better after DuoNeb does not want stay in the hospital. Did discuss the risks of possible heart attack including significant morbidity and even mortality. The patient does have decisional capacity. I will discharge him AGAINST MEDICAL ADVICE  ____________________________________________   FINAL CLINICAL IMPRESSION(S) / ED DIAGNOSES  Final diagnoses:  NSTEMI (non-ST elevated myocardial  infarction) ([place])  COPD with exacerbation (HCC)     Lavonia Drafts, MD 08/20/15 1455  Lavonia Drafts, MD 08/20/15 [phone]

## 2015-08-22 ENCOUNTER — Observation Stay (HOSPITAL_COMMUNITY)
Admission: EM | Admit: 2015-08-22 | Discharge: 2015-08-24 | Disposition: A | Discharge status: Home / Self Care | Payer: Medicare Other | Attending: Internal Medicine | Admitting: Internal Medicine

## 2015-08-22 ENCOUNTER — Emergency Department (HOSPITAL_COMMUNITY): Payer: Medicare Other

## 2015-08-22 ENCOUNTER — Observation Stay (HOSPITAL_BASED_OUTPATIENT_CLINIC_OR_DEPARTMENT_OTHER): Payer: Medicare Other

## 2015-08-22 ENCOUNTER — Encounter (HOSPITAL_COMMUNITY): Payer: Self-pay

## 2015-08-22 DIAGNOSIS — I471 Supraventricular tachycardia: Secondary | ICD-10-CM | POA: Insufficient documentation

## 2015-08-22 DIAGNOSIS — N179 Acute kidney failure, unspecified: Secondary | ICD-10-CM | POA: Insufficient documentation

## 2015-08-22 DIAGNOSIS — R0600 Dyspnea, unspecified: Secondary | ICD-10-CM

## 2015-08-22 DIAGNOSIS — N289 Disorder of kidney and ureter, unspecified: Secondary | ICD-10-CM

## 2015-08-22 DIAGNOSIS — Z23 Encounter for immunization: Secondary | ICD-10-CM | POA: Diagnosis not present

## 2015-08-22 DIAGNOSIS — K219 Gastro-esophageal reflux disease without esophagitis: Secondary | ICD-10-CM | POA: Insufficient documentation

## 2015-08-22 DIAGNOSIS — I110 Hypertensive heart disease with heart failure: Secondary | ICD-10-CM | POA: Diagnosis not present

## 2015-08-22 DIAGNOSIS — R7989 Other specified abnormal findings of blood chemistry: Secondary | ICD-10-CM | POA: Diagnosis not present

## 2015-08-22 DIAGNOSIS — I10 Essential (primary) hypertension: Secondary | ICD-10-CM | POA: Diagnosis not present

## 2015-08-22 DIAGNOSIS — Z7982 Long term (current) use of aspirin: Secondary | ICD-10-CM | POA: Insufficient documentation

## 2015-08-22 DIAGNOSIS — J441 Chronic obstructive pulmonary disease with (acute) exacerbation: Secondary | ICD-10-CM | POA: Diagnosis not present

## 2015-08-22 DIAGNOSIS — I5021 Acute systolic (congestive) heart failure: Secondary | ICD-10-CM | POA: Diagnosis not present

## 2015-08-22 DIAGNOSIS — Z79899 Other long term (current) drug therapy: Secondary | ICD-10-CM | POA: Diagnosis not present

## 2015-08-22 DIAGNOSIS — L97909 Non-pressure chronic ulcer of unspecified part of unspecified lower leg with unspecified severity: Secondary | ICD-10-CM | POA: Insufficient documentation

## 2015-08-22 DIAGNOSIS — R0602 Shortness of breath: Secondary | ICD-10-CM | POA: Diagnosis not present

## 2015-08-22 DIAGNOSIS — D649 Anemia, unspecified: Secondary | ICD-10-CM

## 2015-08-22 DIAGNOSIS — R748 Abnormal levels of other serum enzymes: Secondary | ICD-10-CM | POA: Insufficient documentation

## 2015-08-22 DIAGNOSIS — R05 Cough: Secondary | ICD-10-CM | POA: Diagnosis not present

## 2015-08-22 DIAGNOSIS — D500 Iron deficiency anemia secondary to blood loss (chronic): Secondary | ICD-10-CM | POA: Diagnosis not present

## 2015-08-22 DIAGNOSIS — Z87891 Personal history of nicotine dependence: Secondary | ICD-10-CM | POA: Diagnosis not present

## 2015-08-22 DIAGNOSIS — L97329 Non-pressure chronic ulcer of left ankle with unspecified severity: Secondary | ICD-10-CM | POA: Diagnosis not present

## 2015-08-22 DIAGNOSIS — R778 Other specified abnormalities of plasma proteins: Secondary | ICD-10-CM | POA: Diagnosis present

## 2015-08-22 DIAGNOSIS — I509 Heart failure, unspecified: Secondary | ICD-10-CM | POA: Diagnosis not present

## 2015-08-22 DIAGNOSIS — S81809A Unspecified open wound, unspecified lower leg, initial encounter: Secondary | ICD-10-CM | POA: Diagnosis present

## 2015-08-22 HISTORY — DX: Panic disorder (episodic paroxysmal anxiety): F41.0

## 2015-08-22 HISTORY — DX: Other recurrent and persistent immunoglobulin A nephropathy: N02.B9

## 2015-08-22 HISTORY — DX: Recurrent and persistent hematuria with other morphologic changes: N02.8

## 2015-08-22 LAB — COMPREHENSIVE METABOLIC PANEL
ALT: 20 U/L (ref 17–63)
AST: 32 U/L (ref 15–41)
Albumin: 2.8 g/dL — ABNORMAL LOW (ref 3.5–5.0)
Alkaline Phosphatase: 65 U/L (ref 38–126)
Anion gap: 9 (ref 5–15)
BUN: 46 mg/dL — AB (ref 6–20)
CHLORIDE: 104 mmol/L (ref 101–111)
CO2: 26 mmol/L (ref 22–32)
Calcium: 8.3 mg/dL — ABNORMAL LOW (ref 8.9–10.3)
Creatinine, Ser: 1.37 mg/dL — ABNORMAL HIGH (ref 0.61–1.24)
GFR calc Af Amer: >60 mL/min (ref >60)
GFR, EST NON AFRICAN AMERICAN: 52 mL/min — AB (ref >60)
Glucose, Bld: 205 mg/dL — ABNORMAL HIGH (ref 65–99)
POTASSIUM: 5.2 mmol/L — AB (ref 3.5–5.1)
SODIUM: 139 mmol/L (ref 135–145)
Total Bilirubin: 0.9 mg/dL (ref 0.3–1.2)
Total Protein: 5.2 g/dL — ABNORMAL LOW (ref 6.5–8.1)

## 2015-08-22 LAB — CBC
HEMATOCRIT: 36.9 % — AB (ref 39.0–52.0)
HEMOGLOBIN: 11.4 g/dL — AB (ref 13.0–17.0)
MCH: 26.7 pg (ref 26.0–34.0)
MCHC: 30.9 g/dL (ref 30.0–36.0)
MCV: 86.4 fL (ref 78.0–100.0)
Platelets: 349 10*3/uL (ref 150–400)
RBC: 4.27 MIL/uL (ref 4.22–5.81)
RDW: 15.6 % — ABNORMAL HIGH (ref 11.5–15.5)
WBC: 12.8 10*3/uL — AB (ref 4.0–10.5)

## 2015-08-22 LAB — BASIC METABOLIC PANEL
ANION GAP: 11 (ref 5–15)
BUN: 52 mg/dL — ABNORMAL HIGH (ref 6–20)
CO2: 27 mmol/L (ref 22–32)
Calcium: 8.4 mg/dL — ABNORMAL LOW (ref 8.9–10.3)
Chloride: 103 mmol/L (ref 101–111)
Creatinine, Ser: 1.48 mg/dL — ABNORMAL HIGH (ref 0.61–1.24)
GFR calc Af Amer: 55 mL/min — ABNORMAL LOW (ref >60)
GFR, EST NON AFRICAN AMERICAN: 48 mL/min — AB (ref >60)
GLUCOSE: 152 mg/dL — AB (ref 65–99)
POTASSIUM: 5.2 mmol/L — AB (ref 3.5–5.1)
Sodium: 141 mmol/L (ref 135–145)

## 2015-08-22 LAB — I-STAT TROPONIN, ED: Troponin i, poc: 0.08 ng/mL (ref 0.00–0.08)

## 2015-08-22 LAB — PROTIME-INR
INR: 1.16 (ref 0.00–1.49)
PROTHROMBIN TIME: 15 s (ref 11.6–15.2)

## 2015-08-22 LAB — D-DIMER, QUANTITATIVE (NOT AT ARMC): D DIMER QUANT: 6.83 ug{FEU}/mL — AB (ref 0.00–0.50)

## 2015-08-22 LAB — TSH: TSH: 5.357 u[IU]/mL — AB (ref 0.350–4.500)

## 2015-08-22 LAB — BRAIN NATRIURETIC PEPTIDE: B NATRIURETIC PEPTIDE 5: 465.9 pg/mL — AB (ref 0.0–100.0)

## 2015-08-22 MED ORDER — IOHEXOL 350 MG/ML SOLN
100.0000 mL | Freq: Once | INTRAVENOUS | Status: AC | PRN
  Administered 2015-08-22: 60 mL via INTRAVENOUS

## 2015-08-22 MED ORDER — GUAIFENESIN ER 600 MG PO TB12
1200.0000 mg | ORAL_TABLET | Freq: Two times a day (BID) | ORAL | Status: DC
  Administered 2015-08-22 – 2015-08-24 (×5): 1200 mg via ORAL
  Filled 2015-08-22 (×5): qty 2

## 2015-08-22 MED ORDER — ADENOSINE 6 MG/2ML IV SOLN
INTRAVENOUS | Status: AC
  Administered 2015-08-22: 6 mg via INTRAVENOUS
  Filled 2015-08-22: qty 2

## 2015-08-22 MED ORDER — IPRATROPIUM-ALBUTEROL 0.5-2.5 (3) MG/3ML IN SOLN
3.0000 mL | Freq: Once | RESPIRATORY_TRACT | Status: AC
  Administered 2015-08-22: 3 mL via RESPIRATORY_TRACT
  Filled 2015-08-22: qty 3

## 2015-08-22 MED ORDER — IPRATROPIUM-ALBUTEROL 0.5-2.5 (3) MG/3ML IN SOLN
3.0000 mL | RESPIRATORY_TRACT | Status: DC
  Administered 2015-08-22 (×4): 3 mL via RESPIRATORY_TRACT
  Filled 2015-08-22 (×3): qty 3

## 2015-08-22 MED ORDER — PNEUMOCOCCAL VAC POLYVALENT 25 MCG/0.5ML IJ INJ
0.5000 mL | INJECTION | INTRAMUSCULAR | Status: AC
  Administered 2015-08-23: 0.5 mL via INTRAMUSCULAR
  Filled 2015-08-22: qty 0.5

## 2015-08-22 MED ORDER — ADENOSINE 6 MG/2ML IV SOLN
INTRAVENOUS | Status: AC
  Filled 2015-08-22: qty 2

## 2015-08-22 MED ORDER — SODIUM CHLORIDE 0.9 % IV BOLUS (SEPSIS)
500.0000 mL | Freq: Once | INTRAVENOUS | Status: AC
  Administered 2015-08-22: 500 mL via INTRAVENOUS

## 2015-08-22 MED ORDER — ACETAMINOPHEN 650 MG RE SUPP: 650.0000 mg | Freq: Four times a day (QID) | RECTAL | Status: DC | PRN

## 2015-08-22 MED ORDER — METOPROLOL SUCCINATE ER 25 MG PO TB24: 25.0000 mg | ORAL_TABLET | Freq: Every day | ORAL | Status: DC

## 2015-08-22 MED ORDER — FUROSEMIDE 10 MG/ML IJ SOLN
40.0000 mg | Freq: Once | INTRAMUSCULAR | Status: AC
  Administered 2015-08-22: 40 mg via INTRAVENOUS
  Filled 2015-08-22: qty 4

## 2015-08-22 MED ORDER — METOPROLOL TARTRATE 25 MG PO TABS
25.0000 mg | ORAL_TABLET | Freq: Once | ORAL | Status: AC
  Administered 2015-08-22: 25 mg via ORAL
  Filled 2015-08-22: qty 1

## 2015-08-22 MED ORDER — ASPIRIN EC 81 MG PO TBEC
81.0000 mg | DELAYED_RELEASE_TABLET | Freq: Every day | ORAL | Status: DC
  Administered 2015-08-22 – 2015-08-24 (×3): 81 mg via ORAL
  Filled 2015-08-22 (×3): qty 1

## 2015-08-22 MED ORDER — DOXYCYCLINE HYCLATE 100 MG PO TABS
100.0000 mg | ORAL_TABLET | Freq: Two times a day (BID) | ORAL | Status: DC
  Administered 2015-08-22 – 2015-08-24 (×5): 100 mg via ORAL
  Filled 2015-08-22 (×5): qty 1

## 2015-08-22 MED ORDER — ALBUTEROL SULFATE (2.5 MG/3ML) 0.083% IN NEBU: 2.5000 mg | INHALATION_SOLUTION | RESPIRATORY_TRACT | Status: AC | PRN

## 2015-08-22 MED ORDER — PREDNISONE 50 MG PO TABS
60.0000 mg | ORAL_TABLET | Freq: Every day | ORAL | Status: DC
  Administered 2015-08-22 – 2015-08-24 (×3): 60 mg via ORAL
  Filled 2015-08-22: qty 1
  Filled 2015-08-22: qty 3
  Filled 2015-08-22: qty 1

## 2015-08-22 MED ORDER — ADENOSINE 6 MG/2ML IV SOLN
6.0000 mg | Freq: Once | INTRAVENOUS | Status: AC
  Administered 2015-08-22: 6 mg via INTRAVENOUS
  Filled 2015-08-22: qty 2

## 2015-08-22 MED ORDER — ONDANSETRON HCL 4 MG PO TABS: 4.0000 mg | ORAL_TABLET | Freq: Four times a day (QID) | ORAL | Status: DC | PRN

## 2015-08-22 MED ORDER — HYDRALAZINE HCL 20 MG/ML IJ SOLN: 5.0000 mg | INTRAMUSCULAR | Status: DC | PRN

## 2015-08-22 MED ORDER — ONDANSETRON HCL 4 MG/2ML IJ SOLN: 4.0000 mg | Freq: Four times a day (QID) | INTRAMUSCULAR | Status: DC | PRN

## 2015-08-22 MED ORDER — ENOXAPARIN SODIUM 40 MG/0.4ML ~~LOC~~ SOLN
40.0000 mg | Freq: Every day | SUBCUTANEOUS | Status: DC
  Administered 2015-08-22 – 2015-08-24 (×3): 40 mg via SUBCUTANEOUS
  Filled 2015-08-22 (×3): qty 0.4

## 2015-08-22 MED ORDER — ACETAMINOPHEN 325 MG PO TABS: 650.0000 mg | ORAL_TABLET | Freq: Four times a day (QID) | ORAL | Status: DC | PRN

## 2015-08-22 MED ORDER — SODIUM CHLORIDE 0.9 % IJ SOLN
3.0000 mL | Freq: Two times a day (BID) | INTRAMUSCULAR | Status: DC
  Administered 2015-08-22 – 2015-08-24 (×5): 3 mL via INTRAVENOUS

## 2015-08-22 MED ORDER — METOPROLOL TARTRATE 25 MG PO TABS: 25.0000 mg | ORAL_TABLET | Freq: Two times a day (BID) | ORAL | Status: DC

## 2015-08-22 MED ORDER — ADENOSINE 6 MG/2ML IV SOLN
12.0000 mg | Freq: Once | INTRAVENOUS | Status: DC
  Filled 2015-08-22: qty 4

## 2015-08-22 MED ORDER — ASPIRIN 81 MG PO CHEW
324.0000 mg | CHEWABLE_TABLET | Freq: Once | ORAL | Status: AC
  Administered 2015-08-22: 324 mg via ORAL
  Filled 2015-08-22: qty 4

## 2015-08-22 MED ORDER — METOPROLOL TARTRATE 25 MG PO TABS
25.0000 mg | ORAL_TABLET | Freq: Two times a day (BID) | ORAL | Status: DC
  Administered 2015-08-23 – 2015-08-24 (×3): 25 mg via ORAL
  Filled 2015-08-22 (×3): qty 1

## 2015-08-22 NOTE — ED Provider Notes (Signed)
CSN: [id number]     Arrival date & time 08/22/15  0050 History  By signing my name below, I, Soijett Blue, attest that this documentation has been prepared under the direction and in the presence of Delora Fuel, MD. Electronically Signed: [place], ED Scribe. 08/22/2015. 3:42 AM.   Chief Complaint  Patient presents with  . Shortness of Breath      The history is provided by the patient. No language interpreter was used.    George [name] is a 65 y.o. male with a medical hx of SVT who presents to the Emergency Department complaining of worsening SOB onset last night. He reports that he was laying down to go to bed when his SOB worsened and he states that his SOB began again last night. He notes that his SOB is not worsened with anything in particular and that nothing alleviates his SOB. He notes that he was seen on 08/20/2015 at [facility] for SOB and had imaging, labs, and was to be admitted to the hospital to which the pt declined. He reports that prior to him being seen at [facility] initially he had never had any issues with SOB in the past. Pt was also given metoprolol Rx while at his visit. Pt was also referred to cardiology with whom he had an appointment today but he overslept and didn't go to his appointment. Pt is having associated symptoms of wheezing. He reports that he has not had wheezing like this in the past. He notes that he has tried Rx metoprolol and 81 mg ASA in the morning with no relief of his symptoms. He denies CP, cough, chest tightness, and any other symptoms. Pt states that he quit smoking cigarettes approximately 6-7 months ago.     Past Medical History  Diagnosis Date  . Basal cell carcinoma of right side of nose     "haven't had it removed yet" (05/22/2015)  . SVT (supraventricular tachycardia) ([place]) 05/22/2015  . GERD (gastroesophageal reflux disease)    Past Surgical History  Procedure Laterality Date  . Fracture surgery    . Tonsillectomy and adenoidectomy  ~  1955  . Knee arthroscopy Right ~ 1985  . Femur fracture surgery Right 1970    "had plate & pin put in"  . Femur hardware removal Right 0970    "removed plate; left pin in   Family History  Problem Relation Age of Onset  . Heart disease Mother     Before age 70  . Heart failure Father   . Heart disease Father     After age 54  . Healthy Maternal Grandmother   . Healthy Maternal Grandfather   . Diabetes Paternal Grandmother   . Healthy Paternal Grandfather    Social History  Substance Use Topics  . Smoking status: Former Smoker -- 2.00 packs/day for 50 years    Types: Cigarettes    Quit date: 01/24/2015  . Smokeless tobacco: Never Used  . Alcohol Use: No    Review of Systems  Respiratory: Positive for shortness of breath and wheezing. Negative for cough and chest tightness.   Cardiovascular: Negative for chest pain.      Allergies  Review of patient's allergies indicates no known allergies.  Home Medications   Prior to Admission medications   Medication Sig Start Date End Date Taking? Authorizing Provider  aspirin EC 81 MG EC tablet Take 1 tablet (81 mg total) by mouth daily. 05/23/15   Erma Heritage, PA  metoprolol tartrate (LOPRESSOR)  25 MG tablet Take 0.5 tablets (12.5 mg total) by mouth 2 (two) times daily. 07/11/15   Lorretta Harp, MD   BP 158/107 mmHg  Pulse 94  Temp(Src) 98.2 F (36.8 C) (Oral)  Resp 25  SpO2 91% Physical Exam  Constitutional: He is oriented to person, place, and time. He appears well-developed and well-nourished. No distress.  HENT:  Head: Normocephalic and atraumatic.  Eyes: EOM are normal. Pupils are equal, round, and reactive to light.  Neck: Normal range of motion. Neck supple. No JVD present.  Cardiovascular: Normal rate, regular rhythm and normal heart sounds.  Exam reveals no gallop and no friction rub.   No murmur heard. Pulmonary/Chest: Effort normal. He has wheezes. He has rales. He exhibits no tenderness.  Audible  wheezes that seem to be upper airway. Fine bibasilar rales.   Abdominal: Soft. Bowel sounds are normal. He exhibits no distension and no mass. There is no tenderness.  Musculoskeletal: Normal range of motion. He exhibits edema.  1+ pretibial edema. 1+ presacral edema.  Lymphadenopathy:    He has no cervical adenopathy.  Neurological: He is alert and oriented to person, place, and time. No cranial nerve deficit. He exhibits normal muscle tone. Coordination normal.  Skin: Skin is warm and dry. No rash noted.  Psychiatric: He has a normal mood and affect. His behavior is normal. Judgment and thought content normal.  Nursing note and vitals reviewed.   ED Course  Procedures (including critical care time) DIAGNOSTIC STUDIES: Oxygen Saturation is 91% on RA, low by my interpretation.    COORDINATION OF CARE: 3:39 AM Discussed treatment plan with pt at bedside which includes labs, CXR, breathing treatment and pt agreed to plan.    Labs Review Results for orders placed or performed during the hospital encounter of [id number]  Basic metabolic panel  Result Value Ref Range   Sodium 141 135 - 145 mmol/L   Potassium 5.2 (H) 3.5 - 5.1 mmol/L   Chloride 103 101 - 111 mmol/L   CO2 27 22 - 32 mmol/L   Glucose, Bld 152 (H) 65 - 99 mg/dL   BUN 52 (H) 6 - 20 mg/dL   Creatinine, Ser 1.48 (H) 0.61 - 1.24 mg/dL   Calcium 8.4 (L) 8.9 - 10.3 mg/dL   GFR calc non Af Amer 48 (L) >60 mL/min   GFR calc Af Amer 55 (L) >60 mL/min   Anion gap 11 5 - 15  CBC  Result Value Ref Range   WBC 12.8 (H) 4.0 - 10.5 K/uL   RBC 4.27 4.22 - 5.81 MIL/uL   Hemoglobin 11.4 (L) 13.0 - 17.0 g/dL   HCT 36.9 (L) 39.0 - 52.0 %   MCV 86.4 78.0 - 100.0 fL   MCH 26.7 26.0 - 34.0 pg   MCHC 30.9 30.0 - 36.0 g/dL   RDW 15.6 (H) 11.5 - 15.5 %   Platelets 349 150 - 400 K/uL  Protime-INR - (order if Patient is taking Coumadin / Warfarin)  Result Value Ref Range   Prothrombin Time 15.0 11.6 - 15.2 seconds   INR 1.16 0.00 - 1.49   D-dimer, quantitative  Result Value Ref Range   D-Dimer, Quant 6.83 (H) 0.00 - 0.50 ug/mL-FEU  Brain natriuretic peptide  Result Value Ref Range   B Natriuretic Peptide 465.9 (H) 0.0 - 100.0 pg/mL  I-stat troponin, ED (not at [facility])  Result Value Ref Range   Troponin i, poc 0.08 0.00 - 0.08 ng/mL   Comment 3  Imaging Review Dg Chest 2 View  08/22/2015  CLINICAL DATA:  66 year old male with shortness of breath and cough. EXAM: CHEST  2 VIEW COMPARISON:  Chest radiograph dated 08/20/2015 and CT dated 05/22/2015 FINDINGS: There is diffuse interstitial prominence concerning for edema. No focal consolidation, pleural effusion, or pneumothorax. Stable cardiac silhouette. The osseous structures appear unremarkable. IMPRESSION: Diffuse interstitial prominence likely edema. No focal consolidation. Electronically Signed   By: Anner Crete M.D.   On: 08/22/2015 01:44   Dg Chest Portable 1 View  08/20/2015  CLINICAL DATA:  Weakness, dizziness, shortness of breath starting yesterday EXAM: PORTABLE CHEST 1 VIEW COMPARISON:  05/22/2015 FINDINGS: Cardiomediastinal silhouette is stable. There is streaky interstitial prominence bilateral lower lobe left greater than right. Early pneumonitis or mild interstitial edema cannot be excluded. No pneumothorax or pleural effusion. Bony thorax is stable. IMPRESSION: There is streaky interstitial prominence bilateral lower lobe left greater than right. Early pneumonitis or mild interstitial edema cannot be excluded. Electronically Signed   By: Lahoma Crocker M.D.   On: 08/20/2015 12:13   I have personally reviewed and evaluated these images and lab results as part of my medical decision-making.   EKG Interpretation   Date/Time:  Wednesday August 22 2015 01:00:34 EST Ventricular Rate:  95 PR Interval:  148 QRS Duration: 88 QT Interval:  374 QTC Calculation: 469 R Axis:   43 Text Interpretation:  Normal sinus rhythm Possible Left atrial enlargement   Borderline ECG When compared with ECG of 08/20/2015, No significant change  was found Confirmed by [facility]  MD, Shirely Toren ([id number]) on 08/22/2015 7:24:23 AM      MDM   Final diagnoses:  None    Dyspnea with findings suggestive of CHF, but also wheezing. Wheezing seems to be more upper airway. Old records are reviewed and he had been seen at the emergency department at [facility] 2 days ago and had an elevated troponin but refused admission. He is given albuterol with ipratropium via nebulizer here and he states he feels much better and in T, wheezing has improved substantially. BNP has come back significantly elevated and d-dimer is also elevated. He sent for CT angiogram which shows no evidence of pulmonary embolism. He is started on furosemide. Old records are reviewed and he had been admitted to the hospital last year with paroxysmal supraventricular tachycardia and did have significant elevation of troponin at that time. He does not have a history of congestive heart failure. He will need to be admitted because of new onset CHF. Case is discussed with Dr. Marily Memos of [facility] who agrees to admit the patient.  I personally performed the services described in this documentation, which was scribed in my presence. The recorded information has been reviewed and is accurate.      Delora Fuel, MD [id number]

## 2015-08-22 NOTE — Progress Notes (Signed)
Look at the monitor at the desk, patient HR is 180-190 sustaining, went to the patient room, pt. Look pale and sweaty, RR and MD called, 12 lead EKG done , place pt. On Zoll pad,. 500 ml ns bolus . 6 mg adenosine , and 25 mg metoprolol given per MD order. Patient now in SR/ST 98-112. Will continue to monitor patient.

## 2015-08-22 NOTE — Consult Note (Signed)
Reason for Consult:   PSVT  Requesting Physician: [facility] Primary Cardiologist Dr [name]  HPI:   65 year old thin-appearing divorced Caucasian male with no children is retired from doing Banker work. He was seen at [facility] in Oct 2016 after he was admitted with PSVT and subsequent chest pain converting with intravenous adenosine. He had mildly elevated troponins which led to a Myoview stress test which was low risk. A CT scan to gram showed no evidence of pulmonary emboli but did show a 5 mm nodule which will need to be followed by repeat CT scanning to rule out malignancy given his tobacco abuse history. 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 He was seen in f/u by Dr [name] in November. A 30 day event monitor was unremarkable.          He is admitted now with COPD exacerbation. While on the floor he had an episode of PSVT with VR of 180 associated with hypotension and diaphoresis. This responded to one dose of Adenosine 0.6 mg with resolution of his symptoms. We are asked to see him in consult. An echo done this admission showed his EF to be 40-45% with moderate LVH and diffuse hypokinesis. On CXR he has CHF, BNP is 465. Troponin negative x 1 (before PSVT episode). The pt tells me he had a similar episode a few nights ago and went to [facility]. He signed out [place] after he converted. He denies any fever chills or unusual URI symptoms. He quit smoking a few months ago and I think he has chronic wheezing.    PMHx:  Past Medical History  Diagnosis Date  . SVT (supraventricular tachycardia) ([place]) 05/22/2015  . GERD (gastroesophageal reflux disease)   . Panic attacks   . Basal cell carcinoma of right side of nose   . CHF (congestive heart failure) ([place])   . Berger's disease     Past Surgical History  Procedure Laterality Date  . Fracture surgery    . Tonsillectomy and adenoidectomy  ~ 1955  . Knee arthroscopy Right ~ 1985    . Femur fracture surgery Right 1970    "had plate & pin put in"  . Femur hardware removal Right 1971    "removed plate; left pin in  . Basal cell carcinoma excision Right ~ 06/2015    "side of my nose"    SOCHx:  reports that he quit smoking about 6 months ago. His smoking use included Cigarettes. He has a 100 pack-year smoking history. He has never used smokeless tobacco. He reports that he does not drink alcohol or use illicit drugs.  FAMHx: Family History  Problem Relation Age of Onset  . Heart disease Mother     Before age 88  . Heart failure Father   . Heart disease Father     After age 54  . Healthy Maternal Grandmother   . Healthy Maternal Grandfather   . Diabetes Paternal Grandmother   . Healthy Paternal Grandfather     ALLERGIES: No Known Allergies  ROS: Review of Systems: General: negative for chills  Cardiovascular: negative for chest pain, edema, orthopnea HEENT: negative for any visual disturbances, blindness, glaucoma Dermatological: negative for rash Respiratory: negative for hemoptysis Urologic: negative for hematuria or dysuria Abdominal: negative for nausea, vomiting, diarrhea, bright red blood per rectum, melena, or hematemesis Neurologic: negative for visual changes, syncope, or dizziness Musculoskeletal: negative for back  pain, joint pain, or swelling Psych: cooperative and appropriate All other systems reviewed and are otherwise negative except as noted above.   HOME MEDICATIONS: Prior to Admission medications   Medication Sig Start Date End Date Taking? Authorizing Provider  aspirin EC 81 MG EC tablet Take 1 tablet (81 mg total) by mouth daily. 05/23/15  Yes Erma Heritage, PA  metoprolol tartrate (LOPRESSOR) 25 MG tablet Take 0.5 tablets (12.5 mg total) by mouth 2 (two) times daily. 07/11/15  Yes Lorretta Harp, MD    HOSPITAL MEDICATIONS: I have reviewed the patient's current medications.  VITALS: Blood pressure 138/81, pulse 103,  temperature 98.2 F (36.8 C), temperature source Oral, resp. rate 18, height 6\' 4"  (1.93 m), weight 236 lb 1.8 oz (107.1 kg), SpO2 96 %.  PHYSICAL EXAM: General appearance: alert, cooperative, no distress and poor dentition Neck: no carotid bruit and no JVD Lungs: scattered expiratory wheezing Heart: regular rate and rhythm Abdomen: soft, non-tender; bowel sounds normal; no masses,  no organomegaly Extremities: UNA boot on LLE Pulses: 2+ and symmetric Skin: Skin color, texture, turgor normal. No rashes or lesions Neurologic: Grossly normal  LABS: Results for orders placed or performed during the hospital encounter of 08/22/15 (from the past 24 hour(s))  Basic metabolic panel     Status: Abnormal   Collection Time: 08/22/15  1:01 AM  Result Value Ref Range   Sodium 141 135 - 145 mmol/L   Potassium 5.2 (H) 3.5 - 5.1 mmol/L   Chloride 103 101 - 111 mmol/L   CO2 27 22 - 32 mmol/L   Glucose, Bld 152 (H) 65 - 99 mg/dL   BUN 52 (H) 6 - 20 mg/dL   Creatinine, Ser 1.48 (H) 0.61 - 1.24 mg/dL   Calcium 8.4 (L) 8.9 - 10.3 mg/dL   GFR calc non Af Amer 48 (L) >60 mL/min   GFR calc Af Amer 55 (L) >60 mL/min   Anion gap 11 5 - 15  CBC     Status: Abnormal   Collection Time: 08/22/15  1:01 AM  Result Value Ref Range   WBC 12.8 (H) 4.0 - 10.5 K/uL   RBC 4.27 4.22 - 5.81 MIL/uL   Hemoglobin 11.4 (L) 13.0 - 17.0 g/dL   HCT 36.9 (L) 39.0 - 52.0 %   MCV 86.4 78.0 - 100.0 fL   MCH 26.7 26.0 - 34.0 pg   MCHC 30.9 30.0 - 36.0 g/dL   RDW 15.6 (H) 11.5 - 15.5 %   Platelets 349 150 - 400 K/uL  Protime-INR - (order if Patient is taking Coumadin / Warfarin)     Status: None   Collection Time: 08/22/15  1:01 AM  Result Value Ref Range   Prothrombin Time 15.0 11.6 - 15.2 seconds   INR 1.16 0.00 - 1.49  D-dimer, quantitative     Status: Abnormal   Collection Time: 08/22/15  1:01 AM  Result Value Ref Range   D-Dimer, Quant 6.83 (H) 0.00 - 0.50 ug/mL-FEU  Brain natriuretic peptide     Status:  Abnormal   Collection Time: 08/22/15  1:01 AM  Result Value Ref Range   B Natriuretic Peptide 465.9 (H) 0.0 - 100.0 pg/mL  I-stat troponin, ED (not at [facility], [facility])     Status: None   Collection Time: 08/22/15  1:15 AM  Result Value Ref Range   Troponin i, poc 0.08 0.00 - 0.08 ng/mL   Comment 3          TSH  Status: Abnormal   Collection Time: 08/22/15  9:25 AM  Result Value Ref Range   TSH 5.357 (H) 0.350 - 4.500 uIU/mL    EKG: On adm NSR LVH  IMAGING: Dg Chest 2 View  08/22/2015  CLINICAL DATA:  65 year old male with shortness of breath and cough. EXAM: CHEST  2 VIEW COMPARISON:  Chest radiograph dated 08/20/2015 and CT dated 05/22/2015 FINDINGS: There is diffuse interstitial prominence concerning for edema. No focal consolidation, pleural effusion, or pneumothorax. Stable cardiac silhouette. The osseous structures appear unremarkable. IMPRESSION: Diffuse interstitial prominence likely edema. No focal consolidation. Electronically Signed   By: Anner Crete M.D.   On: 08/22/2015 01:44   Ct Angio Chest Pe W/cm &/or Wo Cm  08/22/2015  CLINICAL DATA:  65 year old male with shortness of breath, dizziness, and weakness. Concern for pulmonary embolism. EXAM: CT ANGIOGRAPHY CHEST WITH CONTRAST TECHNIQUE: Multidetector CT imaging of the chest was performed using the standard protocol during bolus administration of intravenous contrast. Multiplanar CT image reconstructions and MIPs were obtained to evaluate the vascular anatomy. CONTRAST:  87mL OMNIPAQUE IOHEXOL 350 MG/ML SOLN COMPARISON:  Chest radiograph dated 08/22/2015 FINDINGS: There is paraseptal emphysema. There are small bilateral pleural effusions. There is patchy areas of ground-glass airspace opacity predominantly at the lung bases. There is diffuse interstitial prominence and interlobular septal thickening compatible with congestive changes or edema. Superimposed pneumonia is not excluded. Clinical correlation and follow-up recommended.  There is no pneumothorax. The central airways are patent. There is thickening of the central bronchi which may represent bronchopneumonia. The thoracic aorta appears unremarkable. No CT evidence of pulmonary embolism. Bilateral hilar adenopathy. There is no cardiomegaly or pericardial effusion. There is coronary vascular calcification. The visualized esophagus appears unremarkable. No thyroid nodule identified. There is no axillary adenopathy. The chest wall soft tissues appear unremarkable there is mild degenerative changes of the spine. The osseous structures are intact. The visualized upper abdomen is unremarkable. Review of the MIP images confirms the above findings. IMPRESSION: No CT evidence of pulmonary embolism. Small bilateral pleural effusions with congestive changes or edema. Bilateral peribronchial thickening and ground-glass airspace haziness may represent bronchopneumonia. Clinical correlation and follow-up recommended. Electronically Signed   By: Anner Crete M.D.   On: 08/22/2015 06:00    IMPRESSION: Principal Problem:   COPD exacerbation (HCC) Active Problems:   SVT (supraventricular tachycardia) (HCC)   Elevated troponin level   Essential hypertension   AKI (acute kidney injury) (HCC)   Acute congestive heart failure (HCC)   Open leg wound   RECOMMENDATION: Increase Metoprolol to 25 mg BID (hopefully he will not have increased wheezing). Will review with MD but I think he needs an EP consult once stable from a pulmonary standpoint. Dr [name] to see in am. Agree with Lasix 40 mg IV x one ordered by primary service.   Time Spent Directly with Patient:  40 minutes  Kerin Ransom, [place] beeper 08/22/2015, 7:03 PM   Agree with note written by Kerin Ransom [facility]  Pt admitted by [facility] for what sounds like COPD flair. H/O adenosine responsive PSVT in past and again this admission. Feeling better this AM. 2D shows mild-mod diffuse hypokinesis. MV last year low risk. Stopped  smoking 8 months ago. BNP mod elevated >400 and Trop neg. BB was up titrated to metop 25 BID. Will get EP to eval today. May need to be on bronchodilators, ROV with me 3-4 weeks.  Quay Burow 08/23/2015 11:13 AM

## 2015-08-22 NOTE — Progress Notes (Signed)
Progress note  Paged by nursing staff taking care of George [name]. Per report patient developed sustained heartbeat in the 190s. This came on acutely approximately 30 minutes after eating his dinner. Patient progressively feeling more ill. Rapid response was called and I was paged. EKG, chem 8 was immediately ordered and the ED pads placed on patient. EKG shows sinus tach with a heart rate of approximately 190. Chem-8 normal. Adenosine was ordered and given at approximately 1813, 6 mg rapid IV push. Patient experienced usual cardiac reset after approximately 20 seconds with a new sustained rate of approximately 112 bpm. Patient was given a one-time dose of metoprolol 25 mg succinate. Patient went back to his normal state of feeling. Of note patient is only on hom metoprolol 12.5 succinate. Patient to take this medication this morning. Suspect that patient's episode of sustained SVT came from his previous underlying SVT exacerbated by acute respiratory illness with steroid and DuoNeb treatments. Patient is followed by cardiology Dr. [name] of [facility]. This event was discussed with him as well as on-call physician Dr. Mare Ferrari. Dr. [name] requesting to have patient placed on his running list to be followed on 08/23/2015. Overnight cardiology team aware of the patient in case he has another adverse event. Greatly appreciate cardiology input and insight into patient's condition.   Change patient's home metoprolol 12.5 to 25 mg with first dose starting tomorrow morning.   [name], MD Family Medicine 08/22/2015, 6:58 PM

## 2015-08-22 NOTE — ED Notes (Signed)
Breakfast ordered 

## 2015-08-22 NOTE — Significant Event (Signed)
Rapid Response Event Note  Overview: Time Called: 1739 Arrival Time: 1741 Event Type: Cardiac  Initial Focused Assessment: Called by primary RN for patient with a heart rate of 180's.  Upon my arrival to patients room, RN at bedside.  Patient is sitting in bed, talking to me, states he feels dizzy and very sweaty.  He states he became sweaty about 10 minutes ago at which time his rate increased to 180's.  BP 119/86   Interventions:  Spoke with MD, orders for CMET, EKG, 500 cc NS bolus, pads to be placed.  Instructed to bear down which was unsuccessful, patient placed on zoll.  Patient c/o of feeling terrible now , BP 99/87, HR 183.  Patient starting to look pale and still very diaphoretic.  Paged MD back, on his way to bedside.  6 mg adenosine given with MD at bedside at 1813.  HR converted at 1816 Hr 120's, 162/89.  Hr at [place] 105, 145/86.  Patietn states he feels better and color has returned.     Event Summary:  Rn to call if assistance needed   at      at          [facility], Harlin Rain

## 2015-08-22 NOTE — Progress Notes (Signed)
Pt arrived to the unit from ED via stretcher with belongings to the side. VSS, telemetry applied and verified by NT. Pt oriented to the unit and room; skin intact except LLE has unna boot dsg clean, dry and intact. Pt educated on fall/safety precautions and prevention. Pt sitting up in bed comfortably with call light within reach. Will closely monitor pt. Delia Heady RN

## 2015-08-22 NOTE — ED Notes (Signed)
Ortho tech paged; awaiting call back

## 2015-08-22 NOTE — H&P (Signed)
[facility] History and Physical  George [name] [id number] DOB: [birth year] DOA: 08/22/2015  Referring physician: Dr. Roxanne Mins - [facility] PCP: [name], FNP   Chief Complaint: SOB  HPI: George [name] is a 65 y.o. male  Shortness of breath. Started several days ago. Seen at [facility] on 08/20/2015. It was recommended that time the patient be admitted to the hospital due to an elevated troponin the patient refused. He was treated with duo nebs and steroids with improvement in overall symptoms. However last night patient states that he became significantly more short of breath. Symptoms are constant and getting worse. Lower extremity swelling is at baseline. Nothing makes his symptoms better. Nothing makes symptoms worse. Associated with wheezing and cough. Denies any chest pain, palpitations. Patient quit smoking approximately 6 months ago. Of note patient was prescribed aspirin and metoprolol after been seen at [facility] for an elevated troponin which the patient has not started taking yet. Pt had to sleep sitting up last night.   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 Goes to wound care clinic for this. These been ongoing for a year. Gradual improvement over the past year.    Review of Systems:  Constitutional:  No weight loss, night sweats, Fevers, chills, fatigue.  HEENT:  No headaches, Difficulty swallowing,Tooth/dental problems,Sore throat, Cardio-vascular:  No chest pain, dizziness, palpitations  GI:  No heartburn, indigestion, abdominal pain, nausea, vomiting, diarrhea, change in bowel habits, loss of appetite  Resp: PEr HPI Skin:  Per HPi GU:  no dysuria, change in color of urine, no urgency or frequency. No flank pain.  Musculoskeletal:   No joint pain or swelling. No decreased range of motion. No back pain.  Psych:  No change in mood or affect. No depression or anxiety. No memory loss.  Neuro:  No change in  sensation, unilateral strength, or cognitive abilities  All other systems were reviewed and are negative.  Past Medical History  Diagnosis Date  . Basal cell carcinoma of right side of nose     "haven't had it removed yet" (05/22/2015)  . SVT (supraventricular tachycardia) ([place]) 05/22/2015  . GERD (gastroesophageal reflux disease)    Past Surgical History  Procedure Laterality Date  . Fracture surgery    . Tonsillectomy and adenoidectomy  ~ 1955  . Knee arthroscopy Right ~ 1985  . Femur fracture surgery Right 1970    "had plate & pin put in"  . Femur hardware removal Right 0970    "removed plate; left pin in   Social History:  reports that he quit smoking about 6 months ago. His smoking use included Cigarettes. He has a 100 pack-year smoking history. He has never used smokeless tobacco. He reports that he does not drink alcohol or use illicit drugs.  No Known Allergies  Family History  Problem Relation Age of Onset  . Heart disease Mother     Before age 40  . Heart failure Father   . Heart disease Father     After age 65  . Healthy Maternal Grandmother   . Healthy Maternal Grandfather   . Diabetes Paternal Grandmother   . Healthy Paternal Grandfather      Prior to Admission medications   Medication Sig Start Date End Date Taking? Authorizing Provider  aspirin EC 81 MG EC tablet Take 1 tablet (81 mg total) by mouth daily. 05/23/15  Yes Erma Heritage, PA  metoprolol tartrate (LOPRESSOR) 25 MG tablet Take 0.5 tablets (12.5  mg total) by mouth 2 (two) times daily. 07/11/15  Yes Lorretta Harp, MD   Physical Exam: Filed Vitals:   08/22/15 0630 08/22/15 0700 08/22/15 0730 08/22/15 0800  BP: 147/89 123/76 136/78 130/81  Pulse: 89 84 80 82  Temp:      TempSrc:      Resp: 17 17 15 16   SpO2: 99% 97% 99% 98%    Wt Readings from Last 3 Encounters:  08/20/15 106.595 kg (235 lb)  07/11/15 105.235 kg (232 lb)  05/22/15 106.595 kg (235 lb)    General:  Appears calm  and comfortable Eyes:  PERRL, EOMI, normal lids, iris ENT:  grossly normal hearing, lips & tongue Neck:  no LAD, masses or thyromegaly Cardiovascular:  RRR, no m/r/g. 2+ LE edema Respiratory: diffuse wheezing and ronchi, mild increased effort. Diminished in bases.  Abdomen:  soft, ntnd Skin: Numerous healing small ulcerations of the LE on L w/ lateral ankle open lesion ~2cm in diameter Musculoskeletal:  grossly normal tone BUE/BLE Psychiatric:  grossly normal mood and affect, speech fluent and appropriate Neurologic:  CN 2-12 grossly intact, moves all extremities in coordinated fashion.          Labs on Admission:  Basic Metabolic Panel:  Recent Labs Lab 08/20/15 1114 08/22/15 0101  NA 140 141  K 4.5 5.2*  CL 107 103  CO2 25 27  GLUCOSE 105* 152*  BUN 42* 52*  CREATININE 1.34* 1.48*  CALCIUM 8.2* 8.4*   Liver Function Tests: No results for input(s): AST, ALT, ALKPHOS, BILITOT, PROT, ALBUMIN in the last 168 hours. No results for input(s): LIPASE, AMYLASE in the last 168 hours. No results for input(s): AMMONIA in the last 168 hours. CBC:  Recent Labs Lab 08/20/15 1114 08/22/15 0101  WBC 7.1 12.8*  HGB 11.9* 11.4*  HCT 36.1* 36.9*  MCV 81.6 86.4  PLT 259 349   Cardiac Enzymes:  Recent Labs Lab 08/20/15 1114  TROPONINI 0.13*    BNP (last 3 results)  Recent Labs  08/22/15 0101  BNP 465.9*    ProBNP (last 3 results) No results for input(s): PROBNP in the last 8760 hours.   CREATININE: 1.48 mg/dL ABNORMAL (08/22/15 0101) Estimated creatinine clearance - 65.8 mL/min  CBG: No results for input(s): GLUCAP in the last 168 hours.  Radiological Exams on Admission: Dg Chest 2 View  08/22/2015  CLINICAL DATA:  65 year old male with shortness of breath and cough. EXAM: CHEST  2 VIEW COMPARISON:  Chest radiograph dated 08/20/2015 and CT dated 05/22/2015 FINDINGS: There is diffuse interstitial prominence concerning for edema. No focal consolidation, pleural  effusion, or pneumothorax. Stable cardiac silhouette. The osseous structures appear unremarkable. IMPRESSION: Diffuse interstitial prominence likely edema. No focal consolidation. Electronically Signed   By: Anner Crete M.D.   On: 08/22/2015 01:44   Ct Angio Chest Pe W/cm &/or Wo Cm  08/22/2015  CLINICAL DATA:  65 year old male with shortness of breath, dizziness, and weakness. Concern for pulmonary embolism. EXAM: CT ANGIOGRAPHY CHEST WITH CONTRAST TECHNIQUE: Multidetector CT imaging of the chest was performed using the standard protocol during bolus administration of intravenous contrast. Multiplanar CT image reconstructions and MIPs were obtained to evaluate the vascular anatomy. CONTRAST:  54mL OMNIPAQUE IOHEXOL 350 MG/ML SOLN COMPARISON:  Chest radiograph dated 08/22/2015 FINDINGS: There is paraseptal emphysema. There are small bilateral pleural effusions. There is patchy areas of ground-glass airspace opacity predominantly at the lung bases. There is diffuse interstitial prominence and interlobular septal thickening compatible with congestive changes or  edema. Superimposed pneumonia is not excluded. Clinical correlation and follow-up recommended. There is no pneumothorax. The central airways are patent. There is thickening of the central bronchi which may represent bronchopneumonia. The thoracic aorta appears unremarkable. No CT evidence of pulmonary embolism. Bilateral hilar adenopathy. There is no cardiomegaly or pericardial effusion. There is coronary vascular calcification. The visualized esophagus appears unremarkable. No thyroid nodule identified. There is no axillary adenopathy. The chest wall soft tissues appear unremarkable there is mild degenerative changes of the spine. The osseous structures are intact. The visualized upper abdomen is unremarkable. Review of the MIP images confirms the above findings. IMPRESSION: No CT evidence of pulmonary embolism. Small bilateral pleural effusions with  congestive changes or edema. Bilateral peribronchial thickening and ground-glass airspace haziness may represent bronchopneumonia. Clinical correlation and follow-up recommended. Electronically Signed   By: Anner Crete M.D.   On: 08/22/2015 06:00   Dg Chest Portable 1 View  08/20/2015  CLINICAL DATA:  Weakness, dizziness, shortness of breath starting yesterday EXAM: PORTABLE CHEST 1 VIEW COMPARISON:  05/22/2015 FINDINGS: Cardiomediastinal silhouette is stable. There is streaky interstitial prominence bilateral lower lobe left greater than right. Early pneumonitis or mild interstitial edema cannot be excluded. No pneumothorax or pleural effusion. Bony thorax is stable. IMPRESSION: There is streaky interstitial prominence bilateral lower lobe left greater than right. Early pneumonitis or mild interstitial edema cannot be excluded. Electronically Signed   By: Lahoma Crocker M.D.   On: 08/20/2015 12:13      Assessment/Plan Active Problems:   Elevated troponin level   CHF (congestive heart failure) (HCC)   Open leg wound   Essential hypertension   AKI (acute kidney injury) ([place])   COPD exacerbation (HCC)  SOB: Likely multifactorial including undiagnosed CHF decompensation and undiagnosed mild COPD exacerbation. Patient with greater than 50-pack-year smoking history. Stopped smoking 6 months ago. Improved after treating treated at [facility] on 08/20/2015 with steroids and DuoNeb. Lower extremity swelling has been present for approximately one year. O2 for comfort only, no O2 requirement. BNP 465, troponin 0.08, CT angioma without evidence of PE but small pleural effusions, no pneumonia. AF VSS. WBC 12.8 - Telemetry, observation - Prednisone 60 daily - Duo nebs every 4 - PO Doxy - Mucinex - Echo - Lasix 40 IV 1 (consider regular dosing after echo) - Daily weight, strict I and O - outpt PFT - consider starting ACEi and bblocker after acute decompensation and AKI.   Elevated  troponin: 0.13 at [facility] on 08/20/15. Marland Kitchen08 today. Pt left [facility] AMA and did not start bblocker or ASA. EKG no sign of ACS - tele  - ASA 81 - Cycle trop  HTN:  - hydralazine PRN - Start ACEi and BBlocker as outpt.   Chronic leg wounds: L w/ 2cm ulcerated lesion, well healing. Followed at wound center.  -[place] - f/u outpt  AKI: Cr 1.47. Baseline 1.1. Suspect elevation w/ diuresis - BMET in am  Code Status: FULL  DVT Prophylaxis: Lovenox Family Communication: none Disposition Plan: Pending Improvement    MERRELL, DAVID Lenna Sciara, MD Family Medicine [facility] [email or web address] Password [email or web address]

## 2015-08-22 NOTE — ED Notes (Signed)
Pt here with c/o SOB onset last Sunday. Wheezing and labored respirations at triage. O2-02% RA. He went to [facility] and they wanted to admit him for observation but he refused. Pt here now with same complaints. Denies chest pain. He states he was recommended to Cardiology but slept all day today so he did not get a chance to follow up.

## 2015-08-22 NOTE — Progress Notes (Signed)
Orthopedic Tech Progress Note Patient Details:  George [name] 08/05/1951 [id number]  Ortho Devices Type of Ortho Device: Louretta Parma boot Ortho Device/Splint Location: lle Ortho Device/Splint Interventions: Application   [name] 08/22/2015, 10:33 AM

## 2015-08-22 NOTE — Progress Notes (Signed)
  Echocardiogram 2D Echocardiogram has been performed.  Bobbye Charleston 08/22/2015, 3:12 PM

## 2015-08-22 NOTE — ED Notes (Signed)
Ortho tech will see patient in ED

## 2015-08-22 NOTE — ED Notes (Signed)
Pt placed on 2L [place]

## 2015-08-23 DIAGNOSIS — I5021 Acute systolic (congestive) heart failure: Secondary | ICD-10-CM

## 2015-08-23 DIAGNOSIS — R7989 Other specified abnormal findings of blood chemistry: Secondary | ICD-10-CM | POA: Diagnosis not present

## 2015-08-23 DIAGNOSIS — N179 Acute kidney failure, unspecified: Secondary | ICD-10-CM

## 2015-08-23 DIAGNOSIS — J441 Chronic obstructive pulmonary disease with (acute) exacerbation: Secondary | ICD-10-CM | POA: Diagnosis not present

## 2015-08-23 DIAGNOSIS — S81809A Unspecified open wound, unspecified lower leg, initial encounter: Secondary | ICD-10-CM

## 2015-08-23 DIAGNOSIS — I471 Supraventricular tachycardia: Secondary | ICD-10-CM | POA: Diagnosis not present

## 2015-08-23 LAB — TROPONIN I
TROPONIN I: 0.04 ng/mL — AB (ref <0.031)
Troponin I: 0.05 ng/mL — ABNORMAL HIGH (ref <0.031)
Troponin I: 0.07 ng/mL — ABNORMAL HIGH (ref <0.031)

## 2015-08-23 LAB — COMPREHENSIVE METABOLIC PANEL
ALBUMIN: 2.5 g/dL — AB (ref 3.5–5.0)
ALT: 16 U/L — ABNORMAL LOW (ref 17–63)
ANION GAP: 5 (ref 5–15)
AST: 25 U/L (ref 15–41)
Alkaline Phosphatase: 55 U/L (ref 38–126)
BUN: 48 mg/dL — AB (ref 6–20)
CHLORIDE: 108 mmol/L (ref 101–111)
CO2: 28 mmol/L (ref 22–32)
Calcium: 8 mg/dL — ABNORMAL LOW (ref 8.9–10.3)
Creatinine, Ser: 1.36 mg/dL — ABNORMAL HIGH (ref 0.61–1.24)
GFR calc Af Amer: >60 mL/min (ref >60)
GFR, EST NON AFRICAN AMERICAN: 53 mL/min — AB (ref >60)
GLUCOSE: 119 mg/dL — AB (ref 65–99)
POTASSIUM: 5.1 mmol/L (ref 3.5–5.1)
Sodium: 141 mmol/L (ref 135–145)
Total Bilirubin: 0.5 mg/dL (ref 0.3–1.2)
Total Protein: 4.6 g/dL — ABNORMAL LOW (ref 6.5–8.1)

## 2015-08-23 LAB — CBC
HCT: 29.2 % — ABNORMAL LOW (ref 39.0–52.0)
Hemoglobin: 9.2 g/dL — ABNORMAL LOW (ref 13.0–17.0)
MCH: 27.4 pg (ref 26.0–34.0)
MCHC: 31.5 g/dL (ref 30.0–36.0)
MCV: 86.9 fL (ref 78.0–100.0)
PLATELETS: 209 10*3/uL (ref 150–400)
RBC: 3.36 MIL/uL — ABNORMAL LOW (ref 4.22–5.81)
RDW: 15.7 % — AB (ref 11.5–15.5)
WBC: 5.8 10*3/uL (ref 4.0–10.5)

## 2015-08-23 LAB — T4, FREE: Free T4: 1.01 ng/dL (ref 0.61–1.12)

## 2015-08-23 MED ORDER — ALBUTEROL SULFATE (2.5 MG/3ML) 0.083% IN NEBU
2.5000 mg | INHALATION_SOLUTION | RESPIRATORY_TRACT | Status: DC | PRN
  Administered 2015-08-24: 2.5 mg via RESPIRATORY_TRACT
  Filled 2015-08-23: qty 3

## 2015-08-23 MED ORDER — FUROSEMIDE 10 MG/ML IJ SOLN
40.0000 mg | Freq: Every day | INTRAMUSCULAR | Status: DC
  Administered 2015-08-23 – 2015-08-24 (×2): 40 mg via INTRAVENOUS
  Filled 2015-08-23 (×2): qty 4

## 2015-08-23 MED ORDER — IPRATROPIUM-ALBUTEROL 0.5-2.5 (3) MG/3ML IN SOLN
3.0000 mL | Freq: Two times a day (BID) | RESPIRATORY_TRACT | Status: DC
  Administered 2015-08-23 – 2015-08-24 (×3): 3 mL via RESPIRATORY_TRACT
  Filled 2015-08-23 (×3): qty 3

## 2015-08-23 NOTE — Evaluation (Signed)
Physical Therapy Evaluation Patient Details Name: George [name] MRN: [id number] DOB: 1951/07/16 Today's Date: 08/23/2015   History of Present Illness  65 yo male with onset COPD exacerbation with possible lung CA nodule was admitted, has previous R hip fracture with ORIF, hx SVT.  Pt was up to 180-190 pulses last night and is more controlled now.    Clinical Impression  Pt was seen for evaluation of O2 sats with mobility:SATURATION QUALIFICATIONS: (This note is used to comply with regulatory documentation for home oxygen)  Patient Saturations on Room Air at Rest = 98%  Patient Saturations on Room Air while Ambulating = 93%  Patient Saturations on 0 Liters of oxygen while Ambulating = 93%  Please briefly explain why patient needs home oxygen:  Demonstrates control of O2 sats to 98-93% and recovered to 96%, with pulses from 95 to 111, much more controlled than last PM.    Follow Up Recommendations No PT follow up    Equipment Recommendations  None recommended by PT    Recommendations for Other Services       Precautions / Restrictions Precautions Precautions: Fall (telemetry, pads for defibrillation (not attached to unit)) Precaution Comments: monitor vitals with therapy Restrictions Weight Bearing Restrictions: No      Mobility  Bed Mobility Overal bed mobility: Modified Independent                Transfers Overall transfer level: Modified independent Equipment used: None                Ambulation/Gait Ambulation/Gait assistance: Modified independent (Device/Increase time) Ambulation Distance (Feet): 175 Feet Assistive device: None Gait Pattern/deviations: Step-through pattern;Wide base of support;Shuffle Gait velocity: reduced Gait velocity interpretation: Below normal speed for age/gender General Gait Details: Checked vitals pre and post gait with no O2  Stairs            Wheelchair Mobility    Modified Rankin (Stroke Patients Only)        Balance                                             Pertinent Vitals/Pain Pain Assessment: No/denies pain    Home Living Family/patient expects to be discharged to:: Private residence Living Arrangements: Alone   Type of Home: House Home Access: Stairs to enter Entrance Stairs-Rails: Right;Left;Can reach both Technical brewer of Steps: 4 Home Layout: One level Home Equipment: None      Prior Function Level of Independence: Independent               Hand Dominance        Extremity/Trunk Assessment   Upper Extremity Assessment: Overall WFL for tasks assessed           Lower Extremity Assessment: Overall WFL for tasks assessed (ROM is grossly WFL)      Cervical / Trunk Assessment: Normal  Communication   Communication: No difficulties  Cognition Arousal/Alertness: Awake/alert Behavior During Therapy: WFL for tasks assessed/performed Overall Cognitive Status: Within Functional Limits for tasks assessed                      General Comments General comments (skin integrity, edema, etc.): has been able to removed O2 and walk with no assistance but has balance changes with tandem steps and back ward walking is [facility]    Exercises  Assessment/Plan    PT Assessment Patent does not need any further PT services  PT Diagnosis Difficulty walking   PT Problem List    PT Treatment Interventions     PT Goals (Current goals can be found in the Care Plan section) Acute Rehab PT Goals Patient Stated Goal: to get home and rest PT Goal Formulation: All assessment and education complete, DC therapy    Frequency     Barriers to discharge        Co-evaluation               End of Session Equipment Utilized During Treatment: Gait belt Activity Tolerance: Patient tolerated treatment well Patient left: in bed;with call bell/phone within reach Nurse Communication: Mobility status;Other (comment) (O2 sats stable  for gait)    Functional Assessment Tool Used: clinical judgment Functional Limitation: Mobility: Walking and moving around Mobility: Walking and Moving Around Current Status [phone]): At least 1 percent but less than 20 percent impaired, limited or restricted Mobility: Walking and Moving Around Goal Status [phone]): At least 1 percent but less than 20 percent impaired, limited or restricted Mobility: Walking and Moving Around Discharge Status [phone]): At least 1 percent but less than 20 percent impaired, limited or restricted    Time: 1023-1054 PT Time Calculation (min) (ACUTE ONLY): 31 min   Charges:   PT Evaluation $PT Eval Moderate Complexity: 1 Procedure PT Treatments $Gait Training: 8-22 mins   PT G Codes:   PT G-Codes **NOT FOR INPATIENT CLASS** Functional Assessment Tool Used: clinical judgment Functional Limitation: Mobility: Walking and moving around Mobility: Walking and Moving Around Current Status [phone]): At least 1 percent but less than 20 percent impaired, limited or restricted Mobility: Walking and Moving Around Goal Status [phone]): At least 1 percent but less than 20 percent impaired, limited or restricted Mobility: Walking and Moving Around Discharge Status [phone]): At least 1 percent but less than 20 percent impaired, limited or restricted    Ramond Dial 08/23/2015, 11:50 AM   Mee Hives, PT MS Acute Rehab Dept. Number: [facility] [id number] and [place] [phone]

## 2015-08-23 NOTE — Care Management Obs Status (Signed)
[place] NOTIFICATION   Patient Details  Name: George [name] MRN: [id number] Date of Birth: 13-Mar-1951   Medicare Observation Status Notification Given:  Yes    Erenest Rasher, RN 08/23/2015, 9:49 AM

## 2015-08-23 NOTE — Consult Note (Signed)
ELECTROPHYSIOLOGY CONSULT NOTE    Patient ID: George [name] MRN: [id number], DOB/AGE: April 22, 1951 65 y.o.  Admit date: 08/22/2015 Date of Consult: 08/23/2015   Primary Physician: [name], [place] Primary Cardiologist: Dr. [name]  Reason for Consultation: SVT  HPI: George [name] is a 65 y.o. 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  He has LE wounds that he is cared for at a wound care clinic.  He was seen by Dr. [name] after his ER visit in October with SVT and planned for a 30day monitor which he did without arrhythmias noted.  He cam to [facility] with increasing SOB, he originally went to  [facility] 08/20/15 with SOB given steroids and duoneb, recommended to be admitted with abnormal Trop but he declined, yesterday 08/21/14 her came to [facility] with acutely worsening SOB and unable to lay down, sleeping the night prior sitting up in a chair.  Admitted by medicine service for further care and treatment for likely mixed CHF and COPD exacerbation (he is a longstanding heavy smoker, though quit 22mo ago), ARI, and elevated troponin (poc 0.08, I was 0.05 today) .  Cardiology service saw the patient and felt to be COPD, he did have another episode of ST here again responded to IV adenosine, his metoprolol was up-titrated and  EP was called to see him today.  This morning he feels much better, much less SOB, denies any CP, no palpitations since last evening.  No near syncope or syncope.  His SVT hx dates back about 8 years, being the first episode, he does not recall any particular trigger, laid down and it resolved in about 10-15 minutes.  He states about 2 years after another event,and then a couple years again after that a 3rd episode, none of these again does he recall any particular provoking and all self resolved without any particular symptoms otherwise.  In October this seemed to be a very prolonged episode, eventually  feeling very weak and actually brought to his knees, having little strength to get up and call 911, but denies full syncope.  He did have some CP with this event, but thinks this came afterwards, does not recall any particular trigger at home with this event either.  This admission he had significant SOB for a couple days first as described above.    Past Medical History  Diagnosis Date  . SVT (supraventricular tachycardia) ([place]) 05/22/2015  . GERD (gastroesophageal reflux disease)   . Panic attacks   . Basal cell carcinoma of right side of nose   . CHF (congestive heart failure) ([place])   . Berger's disease      Surgical History:  Past Surgical History  Procedure Laterality Date  . Fracture surgery    . Tonsillectomy and adenoidectomy  ~ 1955  . Knee arthroscopy Right ~ 1985  . Femur fracture surgery Right 1970    "had plate & pin put in"  . Femur hardware removal Right 1971    "removed plate; left pin in  . Basal cell carcinoma excision Right ~ 06/2015    "side of my nose"     Prescriptions prior to admission  Medication Sig Dispense Refill Last Dose  . aspirin EC 81 MG EC tablet Take 1 tablet (81 mg total) by mouth daily.   08/22/2015 at Unknown time  . metoprolol tartrate (LOPRESSOR) 25 MG tablet Take 0.5 tablets (12.5 mg total)  by mouth 2 (two) times daily. 180 tablet 3 08/22/2015 at 0030    Inpatient Medications:  . adenosine (ADENOCARD) IV  12 mg Intravenous Once  . aspirin EC  81 mg Oral Daily  . doxycycline  100 mg Oral Q12H  . enoxaparin (LOVENOX) injection  40 mg Subcutaneous Daily  . guaiFENesin  1,200 mg Oral BID  . ipratropium-albuterol  3 mL Nebulization BID  . metoprolol tartrate  25 mg Oral BID  . predniSONE  60 mg Oral Q breakfast  . sodium chloride  3 mL Intravenous Q12H    Allergies: No Known Allergies  Social History   Social History  . Marital Status: Divorced    Spouse Name: N/A  . Number of Children: 0  . Years of Education: 12    Occupational History  . Retired    Social History Main Topics  . Smoking status: Former Smoker -- 2.00 packs/day for 50 years    Types: Cigarettes    Quit date: 01/24/2015  . Smokeless tobacco: Never Used  . Alcohol Use: No  . Drug Use: No  . Sexual Activity: Not Currently   Other Topics Concern  . Not on file   Social History Narrative   Fun: Trade out work, outdoors   Denies religious beliefs effecting health care.      Family History  Problem Relation Age of Onset  . Heart disease Mother     Before age 67  . Heart failure Father   . Heart disease Father     After age 48  . Healthy Maternal Grandmother   . Healthy Maternal Grandfather   . Diabetes Paternal Grandmother   . Healthy Paternal Grandfather      Review of Systems: All other systems reviewed and are otherwise negative except as noted above.  Physical Exam: Filed Vitals:   08/23/15 0059 08/23/15 0455 08/23/15 0756 08/23/15 0904  BP: 118/73 136/77 143/78   Pulse: 75 78 73   Temp: 97.4 F (36.3 C) 97.7 F (36.5 C) 98.2 F (36.8 C)   TempSrc: Oral Oral Oral   Resp: 18 18 18    Height:      Weight:  235 lb 0.2 oz (106.6 kg)    SpO2: 95% 96% 99% 95%   GEN- The patient is thin, in NAD, ambulating in the room easily, alert and oriented x 3 today.   HEENT: normocephalic, atraumatic; sclera clear, conjunctiva pink; hearing intact; oropharynx clear; neck supple, no JVP, poor dentition Lymph- no cervical lymphadenopathy Lungs- some scatterred wheezes b/l, soft ronchi L>R Heart- Regular rate and rhythm, no murmurs, rubs or gallops, PMI not laterally displaced GI- soft, non-tender, non-distended Extremities- no clubbing, cyanosis, or edema MS- no significant deformity or atrophy Skin- warm and dry, no rash or lesion Psych- euthymic mood, full affect Neuro- no gross deficits observed  Labs:   Lab Results  Component Value Date   WBC 5.8 08/23/2015   HGB 9.2* 08/23/2015   HCT 29.2* 08/23/2015   MCV  86.9 08/23/2015   PLT 209 08/23/2015    Recent Labs Lab 08/23/15 0406  NA 141  K 5.1  CL 108  CO2 28  BUN 48*  CREATININE 1.36*  CALCIUM 8.0*  PROT 4.6*  BILITOT 0.5  ALKPHOS 55  ALT 16*  AST 25  GLUCOSE 119*      Radiology/Studies:  Dg Chest 2 View 08/22/2015  CLINICAL DATA:  65 year old male with shortness of breath and cough. EXAM: CHEST  2 VIEW COMPARISON:  Chest radiograph dated 08/20/2015 and CT dated 05/22/2015 FINDINGS: There is diffuse interstitial prominence concerning for edema. No focal consolidation, pleural effusion, or pneumothorax. Stable cardiac silhouette. The osseous structures appear unremarkable. IMPRESSION: Diffuse interstitial prominence likely edema. No focal consolidation. Electronically Signed   By: Anner Crete M.D.   On: 08/22/2015 01:44   Ct Angio Chest Pe W/cm &/or Wo Cm 08/22/2015  CLINICAL DATA:  65 year old male with shortness of breath, dizziness, and weakness. Concern for pulmonary embolism. EXAM: CT ANGIOGRAPHY CHEST WITH CONTRAST TECHNIQUE: Multidetector CT imaging of the chest was performed using the standard protocol during bolus administration of intravenous contrast. Multiplanar CT image reconstructions and MIPs were obtained to evaluate the vascular anatomy. CONTRAST:  35mL OMNIPAQUE IOHEXOL 350 MG/ML SOLN COMPARISON:  Chest radiograph dated 08/22/2015 FINDINGS: There is paraseptal emphysema. There are small bilateral pleural effusions. There is patchy areas of ground-glass airspace opacity predominantly at the lung bases. There is diffuse interstitial prominence and interlobular septal thickening compatible with congestive changes or edema. Superimposed pneumonia is not excluded. Clinical correlation and follow-up recommended. There is no pneumothorax. The central airways are patent. There is thickening of the central bronchi which may represent bronchopneumonia. The thoracic aorta appears unremarkable. No CT evidence of pulmonary embolism.  Bilateral hilar adenopathy. There is no cardiomegaly or pericardial effusion. There is coronary vascular calcification. The visualized esophagus appears unremarkable. No thyroid nodule identified. There is no axillary adenopathy. The chest wall soft tissues appear unremarkable there is mild degenerative changes of the spine. The osseous structures are intact. The visualized upper abdomen is unremarkable. Review of the MIP images confirms the above findings. IMPRESSION: No CT evidence of pulmonary embolism. Small bilateral pleural effusions with congestive changes or edema. Bilateral peribronchial thickening and ground-glass airspace haziness may represent bronchopneumonia. Clinical correlation and follow-up recommended. Electronically Signed   By: Anner Crete M.D.   On: 08/22/2015 06:00   Dg Chest Portable 1 View 08/20/2015  CLINICAL DATA:  Weakness, dizziness, shortness of breath starting yesterday EXAM: PORTABLE CHEST 1 VIEW COMPARISON:  05/22/2015 FINDINGS: Cardiomediastinal silhouette is stable. There is streaky interstitial prominence bilateral lower lobe left greater than right. Early pneumonitis or mild interstitial edema cannot be excluded. No pneumothorax or pleural effusion. Bony thorax is stable. IMPRESSION: There is streaky interstitial prominence bilateral lower lobe left greater than right. Early pneumonitis or mild interstitial edema cannot be excluded. Electronically Signed   By: Lahoma Crocker M.D.   On: 08/20/2015 12:13    EKG: #1 SR #2 SVT, rate 183  TELEMETRY: SR, SVT x1 event here 08/22/15: Echocardiogram Study Conclusions - Left ventricle: The cavity size was moderately dilated. Wall thickness was increased in a pattern of moderate LVH. Systolic function was mildly to moderately reduced. The estimated ejection fraction was in the range of 40% to 45%. Diffuse hypokinesis. - Aortic valve: Calcified non coronary cusp. - Mitral valve: There was mild regurgitation. - Atrial  septum: No defect or patent foramen ovale was identified.  05/23/15: Lexiscan stress myoview IMPRESSION: 1. Inferior wall attenuation on stress and rest imaging probably due to diaphragmatic attenuation given the lack of abnormal wall motion in this vicinity. Scarring of the inferior wall is a differential diagnostic consideration. No inducible ischemia. 2. Normal left ventricular wall motion. 3. Left ventricular ejection fraction 65% 4. Low-risk stress test findings*.  Assessment and Plan:  1. SVT      ER visit with CP/SVT treated with adenosine 05/22/15, had mild Trop elevation then with low risk stress myoview  SVT here yesterday converted to SR with Adenosine 6mg  x1      Started on BB in October with last SVT (12.5mg  BID)      Discussed with the patient medical therapy possibly EPS/Ablation option as well, will await for final POC pending discussion with Dr. Lovena Le.  2. SOB     COPD exacerbation continue with medicine service, on prednisone/doxycycline/duoneb/mucinex     (Nodular opacity RUL noted on prior CT not described on this admit)     +/- CHF  p.BNP was 465, s/p dose of IV lasix yesterday       3. Elevated TSH 5.375     Signed, Tommye Standard, PA-C 08/23/2015 11:32 AM  EP Attending  Patient seen and examined. I have reviewed the findings as noted above, made modifications where needed and represent my findings as well. CV reveals a RRR and lungs are clear with no wheezes and extremities have mild edema. He has COPD and presented with an exacerbation but now has SVT with rates of 180/min. Terminated with IV adenosine. I have discussed the risks/benefits/goals/expectations of EP study and catheter ablation and he wishes to proceed. This will be scheduled as an outpatient in the next few weeks. Ok to DC home tomorrow from cardiology perspective. Continue low dose metoprolol for now.   Mikle Bosworth.D.

## 2015-08-23 NOTE — Progress Notes (Signed)
Initial Nutrition Assessment  DOCUMENTATION CODES:   Not applicable  INTERVENTION:  -No interventions warranted at this time  REASON FOR ASSESSMENT:   Consult Assessment of nutrition requirement/status  ASSESSMENT:   George [name] is a 65 yo male Shortness of breath. Started several days ago. Seen at [facility] on 08/20/2015. It was recommended that time the patient be admitted to the hospital due to an elevated troponin the patient refused. He was treated with duo nebs and steroids with improvement in overall symptoms. However last night patient states that he became significantly more short of breath. Symptoms are constant and getting worse. Lower extremity swelling is at baseline. Nothing makes his symptoms better. Nothing makes symptoms worse. Associated with wheezing and cough. Denies any chest pain, palpitations. Patient quit smoking approximately 6 months ago. Of note patient was prescribed aspirin and metoprolol after been seen at [facility] for an elevated troponin which the patient has not started taking yet. Pt had to sleep sitting up last night.   Spoke with pt briefly at bedside. Pt endorses no issues with shortness of breath preventing him from eating. No N/V/D/C. No Chewing/Swallowing problems. PO intake 100% on file. Pt declined any ONS.  Nutrition-Focused physical exam completed. Findings are no fat depletion, no muscle depletion, and no edema.   No further RD interventions warranted at this time   Diet Order:  Diet Heart Room service appropriate?: Yes; Fluid consistency:: Thin  Skin:  Wound (see comment) (Long term wound to left leg.)  Last BM:  PTA  Height:   Ht Readings from Last 1 Encounters:  08/22/15 6\' 4"  (1.93 m)    Weight:   Wt Readings from Last 1 Encounters:  08/23/15 235 lb 0.2 oz (106.6 kg)    Ideal Body Weight:  91.81 kg  BMI:  Body mass index is 28.62 kg/(m^2).  Estimated Nutritional Needs:   Kcal:  2200-2600  calories  Protein:  105-130 grams  Fluid:  >/= 2.2L  Satira Anis. Kanchan Gal, MS, RD LDN After Hours/Weekend Pager [phone]

## 2015-08-23 NOTE — Progress Notes (Signed)
OT Cancellation Note  Patient Details Name: George [name] MRN: [id number] DOB: 05-Jul-1951   Cancelled Treatment:    Reason Eval/Treat Not Completed: Patient at procedure or test/ unavailable.  Pt talking with physician. Will see tomorrow.   [place], OTR/L  [id number] 08/23/2015 08/23/2015, 3:54 PM

## 2015-08-23 NOTE — Care Management Note (Signed)
Case Management Note  Patient Details  Name: George [name] MRN: [id number] Date of Birth: [birth year]  Subjective/Objective:    Dyspnea, SVT, CHF                Action/Plan: NCM spoke to pt and lives at home alone. His brother, Hammie Spanbauer lives close so has good family support. Pt states he was independent prior to admission. Able to afford all his medications. No DME was needed prior to admission.   Expected Discharge Date:                  Expected Discharge Plan:  Home/Self Care  In-House Referral:  NA  Discharge planning Services  CM Consult, NA  Post Acute Care Choice:  NA Choice offered to:  NA  DME Arranged:  N/A DME Agency:  NA  HH Arranged:  NA HH Agency:  NA  Status of Service:  Completed, signed off  Medicare Important Message Given:    Date Medicare IM Given:    Medicare IM give by:    Date Additional Medicare IM Given:    Additional Medicare Important Message give by:     If discussed at [place] of Stay Meetings, dates discussed:    Additional Comments:  Erenest Rasher, RN 08/23/2015, 3:12 PM

## 2015-08-23 NOTE — Discharge Instructions (Signed)
You are scheduled for your procedure with Dr. Lovena Le (EPS, Electrophysiology study and possible ablation) on Monday 09/10/15, at 7:30AM.  You need to arrive to [facility] at 5:30AM to the admitting desk on January 30th Do not eat or drink anything after midnight the night prior. Do not take any doses of your Metoprolol on Sunday January 29th, or Monday the day of the procedure.

## 2015-08-23 NOTE — Progress Notes (Signed)
[facility]                                                                              Patient Demographics  George [name], is a 65 y.o. male, DOB - 15-Aug-1950, [id number]  Admit date - 08/22/2015   Admitting Physician Waldemar Dickens, MD  Outpatient Primary MD for the patient is [name], FNP  LOS -    Chief Complaint  Patient presents with  . Shortness of Breath      HPI on 08/22/2015 by Dr. [name] George [name] is a 65 y.o. male  Shortness of breath. Started several days ago. Seen at [facility] regional on 08/20/2015. It was recommended that time the patient be admitted to the hospital due to an elevated troponin the patient refused. He was treated with duo nebs and steroids with improvement in overall symptoms. However last night patient states that he became significantly more short of breath. Symptoms are constant and getting worse. Lower extremity swelling is at baseline. Nothing makes his symptoms better. Nothing makes symptoms worse. Associated with wheezing and cough. Denies any chest pain, palpitations. Patient quit smoking approximately 6 months ago. Of note patient was prescribed aspirin and metoprolol after been seen at [facility] for an elevated troponin which the patient has not started taking yet. Pt had to sleep sitting up last night.   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 Goes to wound care clinic for this. These been ongoing for a year. Gradual improvement over the past year.  Assessment & Plan   SVT -Upon admission 08/22/2015, patient had heart rate of 180. He was given adenosine and converted. Currently in sinus rhythm and rate controlled -Cardiology, EP consulted and appreciated -TSH 5.357, free T4 1.01  Dyspnea -Multifactorial including undiagnosed CHF versus COPD exacerbation -Patient was recently seen [facility] 08/20/2015 and was given discharge as well as nebulizer treatment -He  has had lower chimney edema for over a year -BNP 465 -CT chest showed no evidence of PE but small pleural effusions, no pneumonia -Echocardiogram EF of [id number]  Acute systolic CHF exacerbation -Echocardiogram as above -Continue to monitor intake and output, daily weights -Continue Lasix, metoprolol -Cardiology consultation appreciated  COPD exacerbation -Continue meds, doxycycline, Mucinex, prednisone -Chest x-ray showed diffuse interstitial prominence likely edema, no focal consolidation  Elevated troponin -Likely secondary to the above -Continue to cycle  Acute kidney injury -Upon admission, creatinine 1.47 -Baseline appears to be 1.1, currently creatinine 1.36  Essential hypertension -Continue hydralazine as needed -Continue metoprolol -Patient would likely benefit from ACE inhibitor, however pending improvement of his AKI as well as further recommendations from cardiology  Chronic leg wounds -Continue Unna boot and outpatient follow-up with wound care  Code Status: Full  Family Communication: None at bedside  Disposition Plan: Admitted, pending further recommendations from cardiology  Time Spent in minutes   30 minutes  Procedures  None  Consults   Cardiology/EP  DVT Prophylaxis  Lovenox  Lab Results  Component Value Date   PLT 209 08/23/2015    Medications  Scheduled Meds: . adenosine (ADENOCARD) IV  12 mg Intravenous Once  . aspirin EC  81  mg Oral Daily  . doxycycline  100 mg Oral Q12H  . enoxaparin (LOVENOX) injection  40 mg Subcutaneous Daily  . guaiFENesin  1,200 mg Oral BID  . ipratropium-albuterol  3 mL Nebulization BID  . metoprolol tartrate  25 mg Oral BID  . predniSONE  60 mg Oral Q breakfast  . sodium chloride  3 mL Intravenous Q12H   Continuous Infusions:  PRN Meds:.acetaminophen **OR** acetaminophen, albuterol, hydrALAZINE, ondansetron **OR** ondansetron (ZOFRAN) IV  Antibiotics    Anti-infectives    Start     Dose/Rate Route  Frequency Ordered Stop   08/22/15 1000  doxycycline (VIBRA-TABS) tablet 100 mg     100 mg Oral Every 12 hours 08/22/15 0858        Subjective:   George [name] seen and examined today.  Patient states he's feeling much better today denies any chest pain, shortness of breath, abdominal pain, nausea or vomiting.    Objective:   Filed Vitals:   08/23/15 0455 08/23/15 0756 08/23/15 0904 08/23/15 1341  BP: 136/77 143/78  157/88  Pulse: 78 73  98  Temp: 97.7 F (36.5 C) 98.2 F (36.8 C)  97.5 F (36.4 C)  TempSrc: Oral Oral  Oral  Resp: 18 18  18   Height:      Weight: 106.6 kg (235 lb 0.2 oz)     SpO2: 96% 99% 95% 97%    Wt Readings from Last 3 Encounters:  08/23/15 106.6 kg (235 lb 0.2 oz)  08/20/15 106.595 kg (235 lb)  07/11/15 105.235 kg (232 lb)     Intake/Output Summary (Last 24 hours) at 08/23/15 1414 Last data filed at 08/23/15 1301  Gross per 24 hour  Intake   1665 ml  Output   1175 ml  Net    490 ml    Exam  General: Well developed, well nourished, NAD, appears stated age  HEENT: NCAT,  mucous membranes moist.   Neck: Supple, no JVD, no masses  Cardiovascular: S1 S2 auscultated, RRR, no murmurs  Respiratory: Mild exp wheezing  Abdomen: Soft, nontender, nondistended, + bowel sounds  Extremities: warm dry without cyanosis clubbing. 2+ edema in LE B/L. Numerous small ulcerations on LE.  Neuro: AAOx3, nonfocal  Psych: Normal affect and demeanor  Data Review   Micro Results No results found for this or any previous visit (from the past 240 hour(s)).  Radiology Reports Dg Chest 2 View  08/22/2015  CLINICAL DATA:  65 year old male with shortness of breath and cough. EXAM: CHEST  2 VIEW COMPARISON:  Chest radiograph dated 08/20/2015 and CT dated 05/22/2015 FINDINGS: There is diffuse interstitial prominence concerning for edema. No focal consolidation, pleural effusion, or pneumothorax. Stable cardiac silhouette. The osseous structures appear  unremarkable. IMPRESSION: Diffuse interstitial prominence likely edema. No focal consolidation. Electronically Signed   By: Anner Crete M.D.   On: 08/22/2015 01:44   Ct Angio Chest Pe W/cm &/or Wo Cm  08/22/2015  CLINICAL DATA:  65 year old male with shortness of breath, dizziness, and weakness. Concern for pulmonary embolism. EXAM: CT ANGIOGRAPHY CHEST WITH CONTRAST TECHNIQUE: Multidetector CT imaging of the chest was performed using the standard protocol during bolus administration of intravenous contrast. Multiplanar CT image reconstructions and MIPs were obtained to evaluate the vascular anatomy. CONTRAST:  14mL OMNIPAQUE IOHEXOL 350 MG/ML SOLN COMPARISON:  Chest radiograph dated 08/22/2015 FINDINGS: There is paraseptal emphysema. There are small bilateral pleural effusions. There is patchy areas of ground-glass airspace opacity predominantly at the lung bases. There is diffuse interstitial  prominence and interlobular septal thickening compatible with congestive changes or edema. Superimposed pneumonia is not excluded. Clinical correlation and follow-up recommended. There is no pneumothorax. The central airways are patent. There is thickening of the central bronchi which may represent bronchopneumonia. The thoracic aorta appears unremarkable. No CT evidence of pulmonary embolism. Bilateral hilar adenopathy. There is no cardiomegaly or pericardial effusion. There is coronary vascular calcification. The visualized esophagus appears unremarkable. No thyroid nodule identified. There is no axillary adenopathy. The chest wall soft tissues appear unremarkable there is mild degenerative changes of the spine. The osseous structures are intact. The visualized upper abdomen is unremarkable. Review of the MIP images confirms the above findings. IMPRESSION: No CT evidence of pulmonary embolism. Small bilateral pleural effusions with congestive changes or edema. Bilateral peribronchial thickening and ground-glass  airspace haziness may represent bronchopneumonia. Clinical correlation and follow-up recommended. Electronically Signed   By: Anner Crete M.D.   On: 08/22/2015 06:00   Dg Chest Portable 1 View  08/20/2015  CLINICAL DATA:  Weakness, dizziness, shortness of breath starting yesterday EXAM: PORTABLE CHEST 1 VIEW COMPARISON:  05/22/2015 FINDINGS: Cardiomediastinal silhouette is stable. There is streaky interstitial prominence bilateral lower lobe left greater than right. Early pneumonitis or mild interstitial edema cannot be excluded. No pneumothorax or pleural effusion. Bony thorax is stable. IMPRESSION: There is streaky interstitial prominence bilateral lower lobe left greater than right. Early pneumonitis or mild interstitial edema cannot be excluded. Electronically Signed   By: Lahoma Crocker M.D.   On: 08/20/2015 12:13    CBC  Recent Labs Lab 08/20/15 1114 08/22/15 0101 08/23/15 0406  WBC 7.1 12.8* 5.8  HGB 11.9* 11.4* 9.2*  HCT 36.1* 36.9* 29.2*  PLT 259 349 209  MCV 81.6 86.4 86.9  MCH 26.9 26.7 27.4  MCHC 33.0 30.9 31.5  RDW 15.2* 15.6* 15.7*    Chemistries   Recent Labs Lab 08/20/15 1114 08/22/15 0101 08/22/15 1840 08/23/15 0406  NA 140 141 139 141  K 4.5 5.2* 5.2* 5.1  CL 107 103 104 108  CO2 25 27 26 28   GLUCOSE 105* 152* 205* 119*  BUN 42* 52* 46* 48*  CREATININE 1.34* 1.48* 1.37* 1.36*  CALCIUM 8.2* 8.4* 8.3* 8.0*  AST  --   --  32 25  ALT  --   --  20 16*  ALKPHOS  --   --  65 55  BILITOT  --   --  0.9 0.5   ------------------------------------------------------------------------------------------------------------------ estimated creatinine clearance is 71.6 mL/min (by C-G formula based on Cr of 1.36). ------------------------------------------------------------------------------------------------------------------ No results for input(s): HGBA1C in the last 72  hours. ------------------------------------------------------------------------------------------------------------------ No results for input(s): CHOL, HDL, LDLCALC, TRIG, CHOLHDL, LDLDIRECT in the last 72 hours. ------------------------------------------------------------------------------------------------------------------  Recent Labs  08/22/15 0925  TSH 5.357*   ------------------------------------------------------------------------------------------------------------------ No results for input(s): VITAMINB12, FOLATE, FERRITIN, TIBC, IRON, RETICCTPCT in the last 72 hours.  Coagulation profile  Recent Labs Lab 08/22/15 0101  INR 1.16     Recent Labs  08/22/15 0101  DDIMER 6.83*    Cardiac Enzymes  Recent Labs Lab 08/20/15 1114 08/23/15 0845  TROPONINI 0.13* 0.05*   ------------------------------------------------------------------------------------------------------------------ Invalid input(s): POCBNP    Ralphine Hinks D.O. on 08/23/2015 at 2:14 PM  Between 7am to 7pm - Pager - [phone]  After 7pm go to [email or web address] - password [email or web address]  And look for the night coverage person covering for me after hours  [facility] Office  [phone]

## 2015-08-24 ENCOUNTER — Telehealth: Payer: Self-pay

## 2015-08-24 DIAGNOSIS — R7989 Other specified abnormal findings of blood chemistry: Secondary | ICD-10-CM | POA: Diagnosis not present

## 2015-08-24 DIAGNOSIS — J441 Chronic obstructive pulmonary disease with (acute) exacerbation: Secondary | ICD-10-CM | POA: Diagnosis not present

## 2015-08-24 DIAGNOSIS — N179 Acute kidney failure, unspecified: Secondary | ICD-10-CM | POA: Diagnosis not present

## 2015-08-24 DIAGNOSIS — I5021 Acute systolic (congestive) heart failure: Secondary | ICD-10-CM | POA: Diagnosis not present

## 2015-08-24 LAB — RETICULOCYTES
RBC.: 3.88 MIL/uL — ABNORMAL LOW (ref 4.22–5.81)
RETIC COUNT ABSOLUTE: 97 10*3/uL (ref 19.0–186.0)
RETIC CT PCT: 2.5 % (ref 0.4–3.1)

## 2015-08-24 LAB — CBC
HEMATOCRIT: 31.2 % — AB (ref 39.0–52.0)
HEMOGLOBIN: 9.9 g/dL — AB (ref 13.0–17.0)
MCH: 27.3 pg (ref 26.0–34.0)
MCHC: 31.7 g/dL (ref 30.0–36.0)
MCV: 86 fL (ref 78.0–100.0)
PLATELETS: 251 10*3/uL (ref 150–400)
RBC: 3.63 MIL/uL — AB (ref 4.22–5.81)
RDW: 15.6 % — AB (ref 11.5–15.5)
WBC: 8.8 10*3/uL (ref 4.0–10.5)

## 2015-08-24 LAB — BASIC METABOLIC PANEL
ANION GAP: 7 (ref 5–15)
BUN: 43 mg/dL — ABNORMAL HIGH (ref 6–20)
CO2: 27 mmol/L (ref 22–32)
Calcium: 8.5 mg/dL — ABNORMAL LOW (ref 8.9–10.3)
Chloride: 106 mmol/L (ref 101–111)
Creatinine, Ser: 1.24 mg/dL (ref 0.61–1.24)
GFR, EST NON AFRICAN AMERICAN: 59 mL/min — AB (ref >60)
GLUCOSE: 124 mg/dL — AB (ref 65–99)
POTASSIUM: 4.9 mmol/L (ref 3.5–5.1)
Sodium: 140 mmol/L (ref 135–145)

## 2015-08-24 LAB — IRON AND TIBC
IRON: 37 ug/dL — AB (ref 45–182)
Saturation Ratios: 11 % — ABNORMAL LOW (ref 17.9–39.5)
TIBC: 335 ug/dL (ref 250–450)
UIBC: 298 ug/dL

## 2015-08-24 LAB — FOLATE: Folate: 9 ng/mL (ref >5.9)

## 2015-08-24 LAB — TROPONIN I: TROPONIN I: 0.08 ng/mL — AB (ref <0.031)

## 2015-08-24 LAB — FERRITIN: FERRITIN: 457 ng/mL — AB (ref 24–336)

## 2015-08-24 LAB — VITAMIN B12: Vitamin B-12: 219 pg/mL (ref 180–914)

## 2015-08-24 MED ORDER — PREDNISONE 10 MG PO TABS: ORAL_TABLET | ORAL | Status: DC

## 2015-08-24 MED ORDER — METOPROLOL TARTRATE 25 MG PO TABS: 25.0000 mg | ORAL_TABLET | Freq: Two times a day (BID) | ORAL | Status: DC

## 2015-08-24 MED ORDER — DOXYCYCLINE HYCLATE 100 MG PO TABS: 100.0000 mg | ORAL_TABLET | Freq: Two times a day (BID) | ORAL | Status: DC

## 2015-08-24 MED ORDER — GUAIFENESIN ER 600 MG PO TB12: 1200.0000 mg | ORAL_TABLET | Freq: Two times a day (BID) | ORAL | Status: DC

## 2015-08-24 NOTE — Discharge Summary (Signed)
Physician Discharge Summary  George [name] [id number] DOB: 1951/06/09 DOA: 08/22/2015  PCP: [name], FNP  Admit date: 08/22/2015 Discharge date: 08/24/2015  Time spent: 45 minutes  Recommendations for Outpatient Follow-up:  Patient will be discharged to home.  Patient will need to follow up with primary care provider within one week of discharge, repeat BMP.  Patient will also need to follow up with cardiology and electrophysiology, Dr. Lovena Le.  Patient should continue medications as prescribed.  Patient should follow a heart healthy diet. Continue wound care/wound clinic appointments.  Discharge Diagnoses:  Principal Problem:   COPD exacerbation ([place]) Active Problems:   SVT (supraventricular tachycardia) ([facility])   Elevated troponin level   Open leg wound   Essential hypertension   AKI (acute kidney injury) ([place])   Acute congestive heart failure ([place])   Discharge Condition: Stable  Diet recommendation: Heart healthy  Filed Weights   08/22/15 1339 08/23/15 0455 08/24/15 0449  Weight: 107.1 kg (236 lb 1.8 oz) 106.6 kg (235 lb 0.2 oz) 106.5 kg (234 lb 12.6 oz)    History of present illness:  on 08/22/2015 by Dr. [name] George [name] is a 65 y.o. male  Shortness of breath. Started several days ago. Seen at [facility] regional on 08/20/2015. It was recommended that time the patient be admitted to the hospital due to an elevated troponin the patient refused. He was treated with duo nebs and steroids with improvement in overall symptoms. However last night patient states that he became significantly more short of breath. Symptoms are constant and getting worse. Lower extremity swelling is at baseline. Nothing makes his symptoms better. Nothing makes symptoms worse. Associated with wheezing and cough. Denies any chest pain, palpitations. Patient quit smoking approximately 6 months ago. Of note patient was prescribed aspirin and metoprolol after been seen at [facility] for an elevated troponin which the patient has not started taking yet. Pt had to sleep sitting up last night.   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 Goes to wound care clinic for this. These been ongoing for a year. Gradual improvement over the past year.  Hospital Course:  SVT -Upon admission 08/22/2015, patient had heart rate of 180. He was given adenosine and converted. Currently in sinus rhythm and rate controlled -Cardiology, EP consulted and appreciated- recommended EP study and catheter ablation as an outpatient, along with metoprolol -TSH 5.357, free T4 1.01  Dyspnea -Improved -Multifactorial including undiagnosed CHF versus COPD exacerbation -Patient was recently seen [facility] 08/20/2015 and was given discharge as well as nebulizer treatment -He has had lower chimney edema for over a year -BNP 465 -CT chest showed no evidence of PE but small pleural effusions, no pneumonia -Echocardiogram EF of 40-45%, diffuse hypokinesis -Patient walked with PT, On room air: O2 sats at rest 98%, ambulation [id number]  Acute systolic CHF exacerbation -Echocardiogram as above -Continue to monitor intake and output, daily weights -Continue Lasix, metoprolol -Cardiology consultation appreciated  COPD exacerbation -Improving -Continue meds, doxycycline, Mucinex, prednisone -Chest x-ray showed diffuse interstitial prominence likely edema, no focal consolidation  Elevated troponin -Likely secondary to the above, remained fairly flat -Continue aspirin and BB  Acute kidney injury -Resolved -Upon admission, creatinine 1.47 -Baseline appears to be 1.1, currently creatinine 1.24  Essential hypertension -Continue metoprolol -Patient would likely benefit from ACE inhibitor, however pending improvement of his AKI as well as further recommendations from cardiology  Chronic leg wounds -Continue Unna boot and outpatient follow-up with wound  care  Procedures  None  Consults  Cardiology/EP  Discharge Exam: Filed Vitals:   08/24/15 0408 08/24/15 0424  BP:  152/77  Pulse: 94 89  Temp:  98.2 F (36.8 C)  Resp:  20   Exam  General: Well developed, well nourished, NAD  HEENT: NCAT, mucous membranes moist. Poor dentition  Cardiovascular: S1 S2 auscultated, RRR, no murmurs  Respiratory: Mild scattered exp wheezing  Abdomen: Soft, nontender, nondistended, + bowel sounds  Extremities: warm dry without cyanosis clubbing. LLE unna boot  Neuro: AAOx3, nonfocal  Psych: Normal affect and demeanor, pleasant  Discharge Instructions      Discharge Instructions    Discharge instructions    Complete by:  As directed   Patient will be discharged to home.  Patient will need to follow up with primary care provider within one week of discharge, repeat BMP.  Patient will also need to follow up with cardiology and electrophysiology, Dr. Lovena Le.  Patient should continue medications as prescribed.  Patient should follow a heart healthy diet. Continue wound care/wound clinic appointments.            Medication List    TAKE these medications        aspirin 81 MG EC tablet  Take 1 tablet (81 mg total) by mouth daily.     doxycycline 100 MG tablet  Commonly known as:  VIBRA-TABS  Take 1 tablet (100 mg total) by mouth every 12 (twelve) hours.     guaiFENesin 600 MG 12 hr tablet  Commonly known as:  MUCINEX  Take 2 tablets (1,200 mg total) by mouth 2 (two) times daily.     metoprolol tartrate 25 MG tablet  Commonly known as:  LOPRESSOR  Take 1 tablet (25 mg total) by mouth 2 (two) times daily.     predniSONE 10 MG tablet  Commonly known as:  DELTASONE  Take 40mg  (4 tabs) x 3 days, then taper to 30mg  (3 tabs) x 3 days, then 20mg  (2 tabs) x 3days, then 10mg  (1 tab) x 3days, then OFF.       No Known Allergies Follow-up Information    Follow up with [name], FNP.   Specialty:  Family Medicine   Why:   appt scheduled for 08/29/2015 at 2:30 pm   Contact information:   [address] [place] [address] [phone]       Follow up with Cristopher Peru, MD. Schedule an appointment as soon as possible for a visit in 1 week.   Specialty:  Cardiology   Why:  Hospital Follow up, SVT   Contact information:   [address] [place] [address] [phone]        The results of significant diagnostics from this hospitalization (including imaging, microbiology, ancillary and laboratory) are listed below for reference.    Significant Diagnostic Studies: Dg Chest 2 View  08/22/2015  CLINICAL DATA:  65 year old male with shortness of breath and cough. EXAM: CHEST  2 VIEW COMPARISON:  Chest radiograph dated 08/20/2015 and CT dated 05/22/2015 FINDINGS: There is diffuse interstitial prominence concerning for edema. No focal consolidation, pleural effusion, or pneumothorax. Stable cardiac silhouette. The osseous structures appear unremarkable. IMPRESSION: Diffuse interstitial prominence likely edema. No focal consolidation. Electronically Signed   By: Anner Crete M.D.   On: 08/22/2015 01:44   Ct Angio Chest Pe W/cm &/or Wo Cm  08/22/2015  CLINICAL DATA:  65 year old male with shortness of breath, dizziness, and weakness. Concern for pulmonary embolism.  EXAM: CT ANGIOGRAPHY CHEST WITH CONTRAST TECHNIQUE: Multidetector CT imaging of the chest was performed using the standard protocol during bolus administration of intravenous contrast. Multiplanar CT image reconstructions and MIPs were obtained to evaluate the vascular anatomy. CONTRAST:  29mL OMNIPAQUE IOHEXOL 350 MG/ML SOLN COMPARISON:  Chest radiograph dated 08/22/2015 FINDINGS: There is paraseptal emphysema. There are small bilateral pleural effusions. There is patchy areas of ground-glass airspace opacity predominantly at the lung bases. There is diffuse interstitial prominence and interlobular septal thickening compatible with  congestive changes or edema. Superimposed pneumonia is not excluded. Clinical correlation and follow-up recommended. There is no pneumothorax. The central airways are patent. There is thickening of the central bronchi which may represent bronchopneumonia. The thoracic aorta appears unremarkable. No CT evidence of pulmonary embolism. Bilateral hilar adenopathy. There is no cardiomegaly or pericardial effusion. There is coronary vascular calcification. The visualized esophagus appears unremarkable. No thyroid nodule identified. There is no axillary adenopathy. The chest wall soft tissues appear unremarkable there is mild degenerative changes of the spine. The osseous structures are intact. The visualized upper abdomen is unremarkable. Review of the MIP images confirms the above findings. IMPRESSION: No CT evidence of pulmonary embolism. Small bilateral pleural effusions with congestive changes or edema. Bilateral peribronchial thickening and ground-glass airspace haziness may represent bronchopneumonia. Clinical correlation and follow-up recommended. Electronically Signed   By: Anner Crete M.D.   On: 08/22/2015 06:00   Dg Chest Portable 1 View  08/20/2015  CLINICAL DATA:  Weakness, dizziness, shortness of breath starting yesterday EXAM: PORTABLE CHEST 1 VIEW COMPARISON:  05/22/2015 FINDINGS: Cardiomediastinal silhouette is stable. There is streaky interstitial prominence bilateral lower lobe left greater than right. Early pneumonitis or mild interstitial edema cannot be excluded. No pneumothorax or pleural effusion. Bony thorax is stable. IMPRESSION: There is streaky interstitial prominence bilateral lower lobe left greater than right. Early pneumonitis or mild interstitial edema cannot be excluded. Electronically Signed   By: Lahoma Crocker M.D.   On: 08/20/2015 12:13    Microbiology: No results found for this or any previous visit (from the past 240 hour(s)).   Labs: Basic Metabolic Panel:  Recent  Labs Lab 08/20/15 1114 08/22/15 0101 08/22/15 1840 08/23/15 0406 08/24/15 0228  NA 140 141 139 141 140  K 4.5 5.2* 5.2* 5.1 4.9  CL 107 103 104 108 106  CO2 25 27 26 28 27   GLUCOSE 105* 152* 205* 119* 124*  BUN 42* 52* 46* 48* 43*  CREATININE 1.34* 1.48* 1.37* 1.36* 1.24  CALCIUM 8.2* 8.4* 8.3* 8.0* 8.5*   Liver Function Tests:  Recent Labs Lab 08/22/15 1840 08/23/15 0406  AST 32 25  ALT 20 16*  ALKPHOS 65 55  BILITOT 0.9 0.5  PROT 5.2* 4.6*  ALBUMIN 2.8* 2.5*   No results for input(s): LIPASE, AMYLASE in the last 168 hours. No results for input(s): AMMONIA in the last 168 hours. CBC:  Recent Labs Lab 08/20/15 1114 08/22/15 0101 08/23/15 0406 08/24/15 0228  WBC 7.1 12.8* 5.8 8.8  HGB 11.9* 11.4* 9.2* 9.9*  HCT 36.1* 36.9* 29.2* 31.2*  MCV 81.6 86.4 86.9 86.0  PLT 259 349 209 251   Cardiac Enzymes:  Recent Labs Lab 08/20/15 1114 08/23/15 0845 08/23/15 1626 08/23/15 2004 08/24/15 0228  TROPONINI 0.13* 0.05* 0.04* 0.07* 0.08*   BNP: BNP (last 3 results)  Recent Labs  08/22/15 0101  BNP 465.9*    ProBNP (last 3 results) No results for input(s): PROBNP in the last 8760 hours.  CBG: No  results for input(s): GLUCAP in the last 168 hours.     SignedCristal Ford  [facility] 08/24/2015, 10:24 AM

## 2015-08-24 NOTE — Progress Notes (Signed)
Heart Failure Navigator Consult Note  Presentation: George [name] is a 65 y.o. male who presented with shortness of breath. Started several days ago. Seen at [facility] on 08/20/2015. It was recommended that time the patient be admitted to the hospital due to an elevated troponin the patient refused. He was treated with duo nebs and steroids with improvement in overall symptoms. However last night patient states that he became significantly more short of breath. Symptoms are constant and getting worse. Lower extremity swelling is at baseline. Nothing makes his symptoms better. Nothing makes symptoms worse. Associated with wheezing and cough. Denies any chest pain, palpitations. Patient quit smoking approximately 6 months ago. Of note patient was prescribed aspirin and metoprolol after been seen at [facility] for an elevated troponin which the patient has not started taking yet. Pt had to sleep sitting up before admission.  Past Medical History  Diagnosis Date  . SVT (supraventricular tachycardia) ([place]) 05/22/2015  . GERD (gastroesophageal reflux disease)   . Panic attacks   . Basal cell carcinoma of right side of nose   . CHF (congestive heart failure) ([place])   . Berger's disease     Social History   Social History  . Marital Status: Divorced    Spouse Name: N/A  . Number of Children: 0  . Years of Education: 12   Occupational History  . Retired    Social History Main Topics  . Smoking status: Former Smoker -- 2.00 packs/day for 50 years    Types: Cigarettes    Quit date: 01/24/2015  . Smokeless tobacco: Never Used  . Alcohol Use: No  . Drug Use: No  . Sexual Activity: Not Currently   Other Topics Concern  . None   Social History Narrative   Fun: Trade out work, outdoors   Denies religious beliefs effecting health care.     ECHO:Study Conclusions--08/22/15  - Left ventricle: The cavity size was moderately dilated. Wall thickness was increased in a pattern  of moderate LVH. Systolic function was mildly to moderately reduced. The estimated ejection fraction was in the range of 40% to 45%. Diffuse hypokinesis. - Aortic valve: Calcified non coronary cusp. - Mitral valve: There was mild regurgitation. - Atrial septum: No defect or patent foramen ovale was identified.  Transthoracic echocardiography. M-mode, complete 2D, spectral Doppler, and color Doppler. Birthdate: Patient birthdate: 1950-11-26. Age: Patient is 65 yr old. Sex: Gender: male. BMI: 28.7 kg/m^2. Blood pressure:   144/94 Patient status: Inpatient. Study date: Study date: 08/22/2015. Study time: 02:37 PM. Location: Bedside  BNP    Component Value Date/Time   BNP 465.9* 08/22/2015 0101    ProBNP No results found for: PROBNP   Education Assessment and Provision:  Detailed education and instructions provided on heart failure disease management including the following:  Signs and symptoms of Heart Failure When to call the physician Importance of daily weights Low sodium diet Fluid restriction Medication management Anticipated future follow-up appointments  Patient education given on each of the above topics.  Patient acknowledges understanding and acceptance of all instructions.  I discussed the new diagnosis of HF with Mr. George [name].  He does have a scale at home.  I reviewed the importance of daily weights and how weight gains relate to the signs and symptoms of HF.  I also reinforced a low sodium diet and high sodium foods to avoid.  He denies any issues with getting or taking prescribed medications. He will follow with Dr. [name] at [facility].  Education Materials:  "Living Better With Heart Failure" Booklet, Daily Weight Tracker Tool .   High Risk Criteria for Readmission and/or Poor Patient Outcomes:   EF <30%- 40-45%  2 or more admissions in 6 months- Yes  Difficult social situation- No  Demonstrates medication noncompliance- No? Not  taking very recently prescribed medications on admission   Barriers of Care:  New HF, Knowledge and compliance  Discharge Planning:   Plans to return home alone in [place].

## 2015-08-24 NOTE — Evaluation (Signed)
Occupational Therapy Evaluation Patient Details Name: George [name] MRN: [id number] DOB: 10-12-1950 Today's Date: 08/24/2015    History of Present Illness 65 yo male with onset COPD exacerbation with possible lung CA nodule was admitted, has previous R hip fracture with ORIF, hx SVT.     Clinical Impression   Pt is performing self care and ADL transfers at a modified independent level. Educated pt in energy conservation and provided handout. Pt verbalized understanding of all education. No further OT needs.    Follow Up Recommendations  No OT follow up    Equipment Recommendations       Recommendations for Other Services       Precautions / Restrictions        Mobility Bed Mobility Overal bed mobility: Modified Independent                Transfers Overall transfer level: Modified independent Equipment used: None                  Balance                                            ADL Overall ADL's : Modified independent                                       General ADL Comments: Educated pt in energy conservation strategies and provided handout.  Recommended pt sit for showering.     Vision     Perception     Praxis      Pertinent Vitals/Pain Pain Assessment: No/denies pain     Hand Dominance Right   Extremity/Trunk Assessment Upper Extremity Assessment Upper Extremity Assessment: Overall WFL for tasks assessed   Lower Extremity Assessment Lower Extremity Assessment: Defer to PT evaluation   Cervical / Trunk Assessment Cervical / Trunk Assessment: Normal   Communication Communication Communication: No difficulties   Cognition Arousal/Alertness: Awake/alert Behavior During Therapy: WFL for tasks assessed/performed Overall Cognitive Status: Within Functional Limits for tasks assessed                     General Comments       Exercises       Shoulder Instructions      Home  Living Family/patient expects to be discharged to:: Private residence Living Arrangements: Alone Available Help at Discharge: Family;Available PRN/intermittently Type of Home: House Home Access: Stairs to enter [employer] of Steps: 4 Entrance Stairs-Rails: Right;Left;Can reach both Home Layout: One level     Bathroom Shower/Tub: Occupational psychologist: Standard Bathroom Accessibility: No   Home Equipment: None          Prior Functioning/Environment Level of Independence: Independent             OT Diagnosis: Generalized weakness   OT Problem List:     OT Treatment/Interventions:      OT Goals(Current goals can be found in the care plan section) Acute Rehab OT Goals Patient Stated Goal: to get home and rest  OT Frequency:     Barriers to D/C:            Co-evaluation              End of Session Equipment Utilized During Treatment: Oxygen  Activity Tolerance: Patient tolerated treatment well Patient left: in bed;with call bell/phone within reach   Time: 0825-0844 OT Time Calculation (min): 19 min Charges:  OT General Charges $OT Visit: 1 Procedure OT Evaluation $OT Eval Moderate Complexity: 1 Procedure G-Codes: OT G-codes **NOT FOR INPATIENT CLASS** Functional Assessment Tool Used: clinical judgement Functional Limitation: Self care Self Care Current Status [id number]): At least 1 percent but less than 20 percent impaired, limited or restricted Self Care Goal Status [id number]): At least 1 percent but less than 20 percent impaired, limited or restricted Self Care Discharge Status [phone]): At least 1 percent but less than 20 percent impaired, limited or restricted  Malka So 08/24/2015, 8:44 AM  [phone]

## 2015-08-24 NOTE — Telephone Encounter (Signed)
Called patient left message to give [place] a call back, called to set up appointment for ER discharge follow up.

## 2015-08-24 NOTE — Progress Notes (Signed)
Apt made with [facility] for Feb 14,2017 at 9 am; CM talked to patient about having a HHRN until he is seen by the [facility], patient refused stated " I have had 2 of these before and I know what to do." [employer] [phone]

## 2015-08-24 NOTE — Research (Signed)
REDS_0  Informed Consent   Subject Name: George [name]  Subject met inclusion and exclusion criteria.  The informed consent form, study requirements and expectations were reviewed with the subject and questions and concerns were addressed prior to the signing of the consent form.  The subject verbalized understanding of the trail requirements.  The subject agreed to participate in the REDS_1  trial and signed the informed consent.  The informed consent was obtained prior to performance of any protocol-specific procedures for the subject.  A copy of the signed informed consent was given to the subject and a copy was placed in the subject's medical record.  [name] 08/24/2015, 9;00

## 2015-08-24 NOTE — Progress Notes (Signed)
All d/c instructions explained and given to pt.  Verbalized understanding.  D/c off floor via w/c at 1242.  Karie Kirks, Therapist, sports.

## 2015-08-24 NOTE — Research (Signed)
ReDS Vest Discharge Study  Your patient is in the Blinded arm of the Vest at Discharge study.  Your patient has had a ReDS Vest reading and the reading has been transmitted to the cloud.  Your patient is ok for discharge.    Thank You   The research team   

## 2015-08-27 ENCOUNTER — Other Ambulatory Visit: Payer: Self-pay | Admitting: Physician Assistant

## 2015-08-28 ENCOUNTER — Encounter: Payer: Self-pay | Admitting: *Deleted

## 2015-08-28 ENCOUNTER — Encounter: Payer: Self-pay | Admitting: Physician Assistant

## 2015-08-28 ENCOUNTER — Ambulatory Visit (INDEPENDENT_AMBULATORY_CARE_PROVIDER_SITE_OTHER): Payer: Medicare Other | Admitting: Physician Assistant

## 2015-08-28 VITALS — BP 120/72 | HR 81 | Ht 76.0 in | Wt 246.6 lb

## 2015-08-28 DIAGNOSIS — I471 Supraventricular tachycardia: Secondary | ICD-10-CM | POA: Diagnosis not present

## 2015-08-28 DIAGNOSIS — I10 Essential (primary) hypertension: Secondary | ICD-10-CM | POA: Diagnosis not present

## 2015-08-28 DIAGNOSIS — I5022 Chronic systolic (congestive) heart failure: Secondary | ICD-10-CM

## 2015-08-28 NOTE — Patient Instructions (Signed)
Medication Instructions:  No changes today  Labwork: None today  Testing/Procedures: Your physician has recommended that you have an ablation. Catheter ablation is a medical procedure used to treat some cardiac arrhythmias (irregular heartbeats). During catheter ablation, a long, thin, flexible tube is put into a blood vessel in your groin (upper thigh), or neck. This tube is called an ablation catheter. It is then guided to your heart through the blood vessel. Radio frequency waves destroy small areas of heart tissue where abnormal heartbeats may cause an arrhythmia to start. Please see the instruction sheet given to you today. January 30,2017 Dr Cristopher Peru    Follow-Up: Your physician recommends that you schedule a follow-up appointment 2 weeks after the ablation with Georgeanna Harrison or Chanetta Marshall.   Any Other Special Instructions Will Be Listed Below (If Applicable).     If you need a refill on your cardiac medications before your next appointment, please call your pharmacy.

## 2015-08-28 NOTE — Progress Notes (Signed)
Patient ID: George [name], male   DOB: 01/28/1951, 65 y.o.   MRN: [id number]    Date:  08/28/2015   ID:  George [name], DOB 30-May-1951, MRN [id number]  PCP:  [name], FNP  Primary Cardiologist:  [name]   Chief Complaint: Post hospital follow-up   History of Present Illness: George [name] is a 65 y.o. male  65 year old thin-appearing divorced Caucasian male with no children is retired from doing [employer] work. He was seen at [facility] in Oct 2016 after he was admitted with PSVT and subsequent chest pain converting with intravenous adenosine. He had mildly elevated troponins which led to a Myoview stress test which was low risk. A CT scan to gram showed no evidence of pulmonary emboli but did show a 5 mm nodule which will need to be followed by repeat CT scanning to rule out malignancy given his tobacco abuse history. 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 He was seen in f/u by Dr [name] in November. A 30 day event monitor was unremarkable.   He was admitted recently with COPD exacerbation. While on the floor he had an episode of PSVT with VR of 180 associated with hypotension and diaphoresis. This responded to one dose of Adenosine 0.6 mg with resolution of his symptoms. We are asked to see him in consult. An echo done this admission showed his EF to be 40-45% with moderate LVH and diffuse hypokinesis. On CXR he had CHF, BNP is 465. Troponin negative x 1 (before PSVT episode). The pt had a similar episode a few nights for admission and went to [facility]. He signed out [place] after he converted.  Patient is here for posthospital follow-up. He reports having an episode of anxiety this morning and his blood pressure was up however, he does not check it. He says his heart rate was not elevated as it was when he had SVT in the hospital.  He is feeling better right now. He denies orthopnea, lower extremity edema, PND.  His weight is elevated in  the office however, he is wearing heavy work boots, jeans into shirts. His previous weight was in the hospital. He says he quit smoking 8 months ago  The patient currently denies nausea, vomiting, fever, chest pain, shortness of breath,  dizziness, cough, congestion, abdominal pain, hematochezia, melena, claudication.  Wt Readings from Last 3 Encounters:  08/28/15 246 lb 9.6 oz (111.857 kg)  08/24/15 234 lb 12.6 oz (106.5 kg)  08/20/15 235 lb (106.595 kg)     Past Medical History  Diagnosis Date  . SVT (supraventricular tachycardia) ([place]) 05/22/2015  . GERD (gastroesophageal reflux disease)   . Panic attacks   . Basal cell carcinoma of right side of nose   . CHF (congestive heart failure) ([place])   . Berger's disease     Current Outpatient Prescriptions  Medication Sig Dispense Refill  . aspirin EC 81 MG EC tablet Take 1 tablet (81 mg total) by mouth daily.    Marland Kitchen guaiFENesin (MUCINEX) 600 MG 12 hr tablet Take 2 tablets (1,200 mg total) by mouth 2 (two) times daily. 14 tablet 0  . metoprolol tartrate (LOPRESSOR) 25 MG tablet Take 1 tablet (25 mg total) by mouth 2 (two) times daily. 60 tablet 0   No current facility-administered medications for this visit.    Allergies:   No Known Allergies  Social History:  The patient  reports that he quit smoking about  7 months ago. His smoking use included Cigarettes. He has a 100 pack-year smoking history. He has never used smokeless tobacco. He reports that he does not drink alcohol or use illicit drugs.   Family history:   Family History  Problem Relation Age of Onset  . Heart disease Mother     Before age 99  . Heart failure Father   . Heart disease Father     After age 14  . Healthy Maternal Grandmother   . Healthy Maternal Grandfather   . Diabetes Paternal Grandmother   . Healthy Paternal Grandfather     ROS:  Please see the history of present illness.  All other systems reviewed and negative.   PHYSICAL EXAM: VS:  BP 120/72  mmHg  Pulse 81  Ht 6\' 4"  (1.93 m)  Wt 246 lb 9.6 oz (111.857 kg)  BMI 30.03 kg/m2  SpO2 96% Well nourished, well developed, in no acute distress HEENT: Pupils are equal round react to light accommodation extraocular movements are intact.  Neck: no JVDNo cervical lymphadenopathy. Cardiac: Regular rate and rhythm without murmurs rubs or gallops. Lungs:  Mild wheeze but no rales or rhonchi. Abd: soft, nontender, positive bowel sounds all quadrants, no hepatosplenomegaly Ext: Trace lower extremity edema.  2+ radial and dorsalis pedis pulses. Skin: warm and dry Neuro:  Grossly normal   ASSESSMENT AND PLAN:  Problem List Items Addressed This Visit    SVT (supraventricular tachycardia) ([place]) - Primary   Essential hypertension   Chronic systolic heart failure ([place])     Patient appears to be doing well. He said he had an episode of anxiety this morning which past. His heart rate was not elevated. He can tell when he is in SVT. He denies any symptoms of heart failure. His weight does show as elevated some 12 pounds since his admission however, he is wearing heavy work boots, Actor.  He does not appear to be hypervolemic.  We discussed daily weight monitoring and low-sodium diet. He will get a scale today.  Also talked about maneuvers to abort SVT as well as take an extra metoprolol. I told him I would prefer he had a blood pressure cuff before 8 takes any extra metoprolol however. Blood pressures well-controlled. He'll follow up 2 weeks after his ablation

## 2015-08-29 ENCOUNTER — Telehealth: Payer: Self-pay | Admitting: Family

## 2015-08-29 ENCOUNTER — Ambulatory Visit (INDEPENDENT_AMBULATORY_CARE_PROVIDER_SITE_OTHER): Payer: Medicare Other | Admitting: Family

## 2015-08-29 ENCOUNTER — Encounter: Payer: Self-pay | Admitting: Family

## 2015-08-29 ENCOUNTER — Other Ambulatory Visit (INDEPENDENT_AMBULATORY_CARE_PROVIDER_SITE_OTHER): Payer: Medicare Other

## 2015-08-29 VITALS — BP 146/92 | HR 78 | Temp 97.7°F | Resp 18 | Ht 76.0 in | Wt 251.0 lb

## 2015-08-29 DIAGNOSIS — I5022 Chronic systolic (congestive) heart failure: Secondary | ICD-10-CM

## 2015-08-29 DIAGNOSIS — I5021 Acute systolic (congestive) heart failure: Secondary | ICD-10-CM

## 2015-08-29 DIAGNOSIS — E785 Hyperlipidemia, unspecified: Secondary | ICD-10-CM

## 2015-08-29 DIAGNOSIS — J441 Chronic obstructive pulmonary disease with (acute) exacerbation: Secondary | ICD-10-CM

## 2015-08-29 LAB — BASIC METABOLIC PANEL
BUN: 65 mg/dL — AB (ref 6–23)
CHLORIDE: 106 meq/L (ref 96–112)
CO2: 29 mEq/L (ref 19–32)
CREATININE: 1.25 mg/dL (ref 0.40–1.50)
Calcium: 8.3 mg/dL — ABNORMAL LOW (ref 8.4–10.5)
GFR: 61.36 mL/min (ref >60.00)
GLUCOSE: 117 mg/dL — AB (ref 70–99)
Potassium: 4.8 mEq/L (ref 3.5–5.1)
Sodium: 141 mEq/L (ref 135–145)

## 2015-08-29 LAB — COMPREHENSIVE METABOLIC PANEL
ALT: 25 U/L (ref 0–53)
AST: 23 U/L (ref 0–37)
Albumin: 2.9 g/dL — ABNORMAL LOW (ref 3.5–5.2)
Alkaline Phosphatase: 56 U/L (ref 39–117)
BILIRUBIN TOTAL: 0.5 mg/dL (ref 0.2–1.2)
BUN: 65 mg/dL — ABNORMAL HIGH (ref 6–23)
CHLORIDE: 106 meq/L (ref 96–112)
CO2: 29 meq/L (ref 19–32)
CREATININE: 1.25 mg/dL (ref 0.40–1.50)
Calcium: 8.3 mg/dL — ABNORMAL LOW (ref 8.4–10.5)
GFR: 61.36 mL/min (ref >60.00)
GLUCOSE: 117 mg/dL — AB (ref 70–99)
Potassium: 4.8 mEq/L (ref 3.5–5.1)
SODIUM: 141 meq/L (ref 135–145)
Total Protein: 5.2 g/dL — ABNORMAL LOW (ref 6.0–8.3)

## 2015-08-29 LAB — LIPID PANEL
CHOL/HDL RATIO: 4
Cholesterol: 187 mg/dL (ref 0–200)
HDL: 41.8 mg/dL (ref >39.00)
LDL CALC: 116 mg/dL — AB (ref 0–99)
NONHDL: 144.94
Triglycerides: 146 mg/dL (ref 0.0–149.0)
VLDL: 29.2 mg/dL (ref 0.0–40.0)

## 2015-08-29 LAB — BRAIN NATRIURETIC PEPTIDE: PRO B NATRI PEPTIDE: 894 pg/mL — AB (ref 0.0–100.0)

## 2015-08-29 MED ORDER — FUROSEMIDE 20 MG PO TABS: ORAL_TABLET | ORAL | Status: DC

## 2015-08-29 MED ORDER — LEVALBUTEROL TARTRATE 45 MCG/ACT IN AERO: 1.0000 | INHALATION_SPRAY | RESPIRATORY_TRACT | Status: DC | PRN

## 2015-08-29 MED ORDER — PREDNISONE 10 MG (21) PO TBPK: ORAL_TABLET | ORAL | Status: DC

## 2015-08-29 NOTE — Progress Notes (Signed)
Pre visit review using our clinic review tool, if applicable. No additional management support is needed unless otherwise documented below in the visit note. 

## 2015-08-29 NOTE — Progress Notes (Signed)
Subjective:    Patient ID: George [name], male    DOB: May 01, 1951, 65 y.o.   MRN: [id number]  Chief Complaint  Patient presents with  . Hospitalization Follow-up    states that he feels weak and still has SOB     HPI:  George [name] is a 65 y.o. male who  has a past medical history of SVT (supraventricular tachycardia) ([place]) (05/22/2015); GERD (gastroesophageal reflux disease); Panic attacks; Basal cell carcinoma of right side of nose; CHF (congestive heart failure) ([place]); and Berger's disease. and presents today for a follow up office visit.   Recently evaluated in the emergency department and admitted to the hospital with worsening shortness of breath especially with lying down. He was also noted to have the associated symptoms of wheezing. At the time denies chest pain, cough, chest tightness, or other cardiac symptoms. Physical exam with audible wheezes and fine bibasilar rales with pretibial edema. CT scanning with no evidence of pulmonary embolism despite high d-dimer. He was admitted to the hospital with concern for new onset heart failure. He was also noted to have a heart rate of 180 which was treated with adenosine which converted back to sinus rhythm and rate controlled. Cardiology was consult and recommended outpatient ablation which is scheduled for January 30. Shortness of breath was likely to CHF and COPD. BNP was also elevated at 465. 2-D echocardiogram showed decreased ejection fraction of 40-45% diffuse hypokinesis. COPD was treated with doxycycline, Mucinex, and prednisone. All hospital records and tests were reviewed in detail.  Since leaving the hospital he continues to experience weakness and shortness of breath. Not currently maintained on medication for his COPD and is scheduled for an ablation on January 30th for underlying supraventricular tachycardia.. Denies fevers. Has gained about 5 pounds in about a 2 day time period.   No Known Allergies   Current Outpatient  Prescriptions on File Prior to Visit  Medication Sig Dispense Refill  . aspirin EC 81 MG EC tablet Take 1 tablet (81 mg total) by mouth daily.    Marland Kitchen guaiFENesin (MUCINEX) 600 MG 12 hr tablet Take 2 tablets (1,200 mg total) by mouth 2 (two) times daily. 14 tablet 0  . metoprolol tartrate (LOPRESSOR) 25 MG tablet Take 1 tablet (25 mg total) by mouth 2 (two) times daily. 60 tablet 0   No current facility-administered medications on file prior to visit.     Past Surgical History  Procedure Laterality Date  . Fracture surgery    . Tonsillectomy and adenoidectomy  ~ 1955  . Knee arthroscopy Right ~ 1985  . Femur fracture surgery Right 1970    "had plate & pin put in"  . Femur hardware removal Right 1971    "removed plate; left pin in  . Basal cell carcinoma excision Right ~ 06/2015    "side of my nose"    Past Medical History  Diagnosis Date  . SVT (supraventricular tachycardia) ([place]) 05/22/2015  . GERD (gastroesophageal reflux disease)   . Panic attacks   . Basal cell carcinoma of right side of nose   . CHF (congestive heart failure) ([place])   . Berger's disease     Review of Systems  Constitutional: Negative for fever and chills.  Respiratory: Positive for cough, shortness of breath and wheezing.       Objective:    BP 146/92 mmHg  Pulse 78  Temp(Src) 97.7 F (36.5 C) (Oral)  Resp 18  Ht 6\' 4"  (1.93 m)  Wt  251 lb (113.853 kg)  BMI 30.57 kg/m2  SpO2 96% Nursing note and vital signs reviewed.  Physical Exam  Constitutional: He is oriented to person, place, and time. He appears well-developed and well-nourished. No distress.  Cardiovascular: Normal rate, regular rhythm, normal heart sounds and intact distal pulses.   Pulmonary/Chest: Effort normal. He has wheezes.  Neurological: He is alert and oriented to person, place, and time.  Skin: Skin is warm and dry.  Psychiatric: He has a normal mood and affect. His behavior is normal. Judgment and thought content normal.        Assessment & Plan:   Problem List Items Addressed This Visit      Cardiovascular and Mediastinum   Chronic systolic heart failure ([place])    Symptoms with shortness of breath concern for heart failure exacerbation. Obtain basic metabolic panel and BNP. Pending lab results consider possible dosage of furosemide to decrease weight as he has gained approximately 5 pounds in the last 2-3 days.      Acute congestive heart failure (HCC)   Relevant Orders   B Nat Peptide     Respiratory   COPD exacerbation (HCC) - Primary    Significant wheezing with concern for uncontrolled COPD. Not currently maintained on medications. Start Xopenex as needed for wheezing. Start Anoro daily. Start prednisone if symptoms do improve with Anoro. Follow up if symptoms worsen or fail to improve.       Relevant Medications   levalbuterol (XOPENEX HFA) 45 MCG/ACT inhaler   predniSONE (STERAPRED UNI-PAK 21 TAB) 10 MG (21) TBPK tablet   Other Relevant Orders   Basic metabolic panel   B Nat Peptide

## 2015-08-29 NOTE — Assessment & Plan Note (Signed)
Symptoms with shortness of breath concern for heart failure exacerbation. Obtain basic metabolic panel and BNP. Pending lab results consider possible dosage of furosemide to decrease weight as he has gained approximately 5 pounds in the last 2-3 days.

## 2015-08-29 NOTE — Patient Instructions (Signed)
Thank you for choosing [facility].  Summary/Instructions:  Your prescription(s) have been submitted to your pharmacy or been printed and provided for you. Please take as directed and contact our office if you believe you are having problem(s) with the medication(s) or have any questions.  Please stop by the lab on the basement level of the building for your blood work. Your results will be released to [place] (or called to you) after review, usually within 72 hours after test completion. If any changes need to be made, you will be notified at that same time.  If your symptoms worsen or fail to improve, please contact our office for further instruction, or in case of emergency go directly to the emergency room at the closest medical facility.    Start Anoro 1 puff daily and Xopenex as needed for wheezing. Start prednisone taper if no improvement with Anoro.  We will follow up on additional blood work shortly.

## 2015-08-29 NOTE — Telephone Encounter (Signed)
Please inform patient that his fluid levels have increased significantly. Therefore I have sent in a prescription for furosemide for him to start. I would like him to take 2 tablets in the morning for 3 days and then 1 tablet daily. I would like him to follow up in 1 week to determine resolution/improvement of symptoms. Continue to weigh himself at home and if   Call MD: Anytime you have any of the following symptoms: 1) 3 pound weight gain in 24 hours or 5 pounds in 1 week 2) shortness of breath, with or without a dry hacking cough 3) swelling in the hands, feet or stomach 4) if you have to sleep on extra pillows at night in order to breathe.

## 2015-08-29 NOTE — Assessment & Plan Note (Signed)
Significant wheezing with concern for uncontrolled COPD. Not currently maintained on medications. Start Xopenex as needed for wheezing. Start Anoro daily. Start prednisone if symptoms do improve with Anoro. Follow up if symptoms worsen or fail to improve.

## 2015-08-30 NOTE — Telephone Encounter (Signed)
LVM for pt to call back.

## 2015-08-30 NOTE — Telephone Encounter (Signed)
Patient called back. George [name] and I advised the patient of the results and are scheduling a 1 week follow up

## 2015-09-06 ENCOUNTER — Encounter: Payer: Self-pay | Admitting: Internal Medicine

## 2015-09-06 ENCOUNTER — Ambulatory Visit (INDEPENDENT_AMBULATORY_CARE_PROVIDER_SITE_OTHER): Payer: Medicare Other | Admitting: Internal Medicine

## 2015-09-06 VITALS — BP 118/78 | HR 92 | Temp 98.3°F | Resp 20 | Ht 76.0 in | Wt 243.1 lb

## 2015-09-06 DIAGNOSIS — I471 Supraventricular tachycardia: Secondary | ICD-10-CM | POA: Diagnosis not present

## 2015-09-06 DIAGNOSIS — J441 Chronic obstructive pulmonary disease with (acute) exacerbation: Secondary | ICD-10-CM

## 2015-09-06 DIAGNOSIS — I5022 Chronic systolic (congestive) heart failure: Secondary | ICD-10-CM | POA: Diagnosis not present

## 2015-09-06 MED ORDER — PREDNISONE 20 MG PO TABS: 40.0000 mg | ORAL_TABLET | Freq: Every day | ORAL | Status: DC

## 2015-09-06 MED ORDER — MOMETASONE FURO-FORMOTEROL FUM 100-5 MCG/ACT IN AERO: 2.0000 | INHALATION_SPRAY | Freq: Two times a day (BID) | RESPIRATORY_TRACT | Status: DC

## 2015-09-06 MED ORDER — ZOLPIDEM TARTRATE 5 MG PO TABS
5.0000 mg | ORAL_TABLET | Freq: Every evening | ORAL | Status: DC | PRN
Start: 2015-09-06 — End: 2016-03-03

## 2015-09-06 NOTE — Progress Notes (Signed)
Pre visit review using our clinic review tool, if applicable. No additional management support is needed unless otherwise documented below in the visit note. 

## 2015-09-06 NOTE — Assessment & Plan Note (Signed)
Will communicate with Dr. Lovena Le regarding his upcoming ablation. He is scheduled for Monday but unless he is feeling 100% resolved from the breathing I feel that this should wait until his COPD flare is gone. He is dyspneic on exam today.

## 2015-09-06 NOTE — Assessment & Plan Note (Signed)
He has taken the lasix and is down about 8 pounds. Has not helped with his breathing.

## 2015-09-06 NOTE — Progress Notes (Signed)
   Subjective:    Patient ID: George [name], male    DOB: [birth year], 65 y.o.   MRN: [id number]  HPI The patient is a 65 YO man coming in for follow up of his SOB. He was seen and treated for a CHF exacerbation and COPD exacerbation. He took the lasix and has lost about 8 pounds since last visit. He has noticed that overall he is feeling less bloated but does not feel that that affected his breathing. He did use the anoro sample that he was given which helped but he ran out yesterday. He is not breathing well since stopping. He is not able to sleep well due to the breathing he is sitting up to sleep. Still coughing a lot. No fevers or chills. No sick contacts. Did not fill the steroid taper as he forgot about it. Lost the rx.   Review of Systems  Constitutional: Positive for activity change, appetite change and unexpected weight change. Negative for fever, chills and fatigue.       Down weight with fluid pill  HENT: Negative.   Eyes: Negative.   Respiratory: Positive for cough, chest tightness, shortness of breath and wheezing.   Cardiovascular: Negative for chest pain and leg swelling.  Gastrointestinal: Negative for nausea, abdominal pain, diarrhea, constipation and abdominal distention.  Musculoskeletal: Negative.   Skin: Negative.   Neurological: Negative.       Objective:   Physical Exam  Constitutional: He is oriented to person, place, and time. He appears well-developed and well-nourished.  HENT:  Head: Normocephalic and atraumatic.  Eyes: EOM are normal.  Neck: Normal range of motion.  Cardiovascular: Normal rate.   Pulmonary/Chest: Effort normal. No respiratory distress. He has wheezes. He has no rales. He exhibits no tenderness.  Able to speak in full sentences but dyspneic. Diffuse expiratory wheezing.   Abdominal: Soft. He exhibits no distension. There is no tenderness.  Musculoskeletal: He exhibits no edema.  Neurological: He is alert and oriented to person, place, and  time. Coordination normal.  Skin: Skin is warm and dry.   Filed Vitals:   09/06/15 1118  BP: 118/78  Pulse: 92  Temp: 98.3 F (36.8 C)  TempSrc: Oral  Resp: 20  Height: 6\' 4"  (1.93 m)  Weight: 243 lb 1.9 oz (110.279 kg)  SpO2: 93%      Assessment & Plan:

## 2015-09-06 NOTE — Assessment & Plan Note (Signed)
He did not fill his steroid taper so rx for prednisone 40 mg daily for 1 week. Also rx for dulera for long term use. Talked to him about the difference in maintenance and prn emergency (xopenex). He will start using the dulera and prednisone for his COPD which is still exacerbated today. May need to delay his upcoming ablation procedure.

## 2015-09-06 NOTE — Patient Instructions (Addendum)
We have sent in steroid medicine to help the breathing. It is called prednisone and you will take 2 pills a day for 1 week.   If you are feeling no better by Sunday call the cardiologist and see if they can move back the procedure a little bit.

## 2015-09-10 ENCOUNTER — Ambulatory Visit (HOSPITAL_COMMUNITY)
Admission: RE | Admit: 2015-09-10 | Discharge: 2015-09-11 | Disposition: A | Discharge status: Home / Self Care | Payer: Medicare Other | Source: Ambulatory Visit | Attending: Internal Medicine | Admitting: Internal Medicine

## 2015-09-10 ENCOUNTER — Encounter (HOSPITAL_COMMUNITY)
Admission: RE | Disposition: A | Discharge status: Home / Self Care | Payer: Self-pay | Source: Ambulatory Visit | Attending: Internal Medicine

## 2015-09-10 ENCOUNTER — Encounter (HOSPITAL_COMMUNITY): Payer: Self-pay | Admitting: Internal Medicine

## 2015-09-10 DIAGNOSIS — J441 Chronic obstructive pulmonary disease with (acute) exacerbation: Secondary | ICD-10-CM | POA: Diagnosis not present

## 2015-09-10 DIAGNOSIS — I509 Heart failure, unspecified: Secondary | ICD-10-CM | POA: Insufficient documentation

## 2015-09-10 DIAGNOSIS — Z87891 Personal history of nicotine dependence: Secondary | ICD-10-CM | POA: Diagnosis not present

## 2015-09-10 DIAGNOSIS — I471 Supraventricular tachycardia: Secondary | ICD-10-CM | POA: Diagnosis present

## 2015-09-10 DIAGNOSIS — N028 Recurrent and persistent hematuria with other morphologic changes: Secondary | ICD-10-CM | POA: Diagnosis not present

## 2015-09-10 DIAGNOSIS — Z7982 Long term (current) use of aspirin: Secondary | ICD-10-CM | POA: Insufficient documentation

## 2015-09-10 DIAGNOSIS — I110 Hypertensive heart disease with heart failure: Secondary | ICD-10-CM | POA: Diagnosis not present

## 2015-09-10 DIAGNOSIS — Z79899 Other long term (current) drug therapy: Secondary | ICD-10-CM | POA: Insufficient documentation

## 2015-09-10 DIAGNOSIS — K219 Gastro-esophageal reflux disease without esophagitis: Secondary | ICD-10-CM | POA: Insufficient documentation

## 2015-09-10 DIAGNOSIS — R0602 Shortness of breath: Secondary | ICD-10-CM

## 2015-09-10 HISTORY — PX: ELECTROPHYSIOLOGIC STUDY: SHX172A

## 2015-09-10 LAB — BASIC METABOLIC PANEL
Anion gap: 10 (ref 5–15)
BUN: 34 mg/dL — AB (ref 6–20)
CALCIUM: 8.3 mg/dL — AB (ref 8.9–10.3)
CHLORIDE: 106 mmol/L (ref 101–111)
CO2: 28 mmol/L (ref 22–32)
CREATININE: 1.16 mg/dL (ref 0.61–1.24)
GFR calc non Af Amer: >60 mL/min (ref >60)
GLUCOSE: 103 mg/dL — AB (ref 65–99)
Potassium: 3.8 mmol/L (ref 3.5–5.1)
Sodium: 144 mmol/L (ref 135–145)

## 2015-09-10 LAB — CBC
HEMATOCRIT: 32.2 % — AB (ref 39.0–52.0)
HEMOGLOBIN: 10.2 g/dL — AB (ref 13.0–17.0)
MCH: 27.9 pg (ref 26.0–34.0)
MCHC: 31.7 g/dL (ref 30.0–36.0)
MCV: 88.2 fL (ref 78.0–100.0)
Platelets: 213 10*3/uL (ref 150–400)
RBC: 3.65 MIL/uL — ABNORMAL LOW (ref 4.22–5.81)
RDW: 17.5 % — AB (ref 11.5–15.5)
WBC: 10.1 10*3/uL (ref 4.0–10.5)

## 2015-09-10 SURGERY — A-FLUTTER/A-TACH/SVT ABLATION
Anesthesia: LOCAL

## 2015-09-10 MED ORDER — ASPIRIN EC 81 MG PO TBEC
81.0000 mg | DELAYED_RELEASE_TABLET | Freq: Every day | ORAL | Status: DC
  Administered 2015-09-10 – 2015-09-11 (×2): 81 mg via ORAL
  Filled 2015-09-10 (×2): qty 1

## 2015-09-10 MED ORDER — HYDRALAZINE HCL 20 MG/ML IJ SOLN
10.0000 mg | Freq: Once | INTRAMUSCULAR | Status: AC
  Administered 2015-09-10 (×2): 10 mg via INTRAVENOUS

## 2015-09-10 MED ORDER — SODIUM CHLORIDE 0.9% FLUSH: 3.0000 mL | INTRAVENOUS | Status: DC | PRN

## 2015-09-10 MED ORDER — SODIUM CHLORIDE 0.9% FLUSH
3.0000 mL | Freq: Two times a day (BID) | INTRAVENOUS | Status: DC
  Administered 2015-09-10 – 2015-09-11 (×2): 3 mL via INTRAVENOUS

## 2015-09-10 MED ORDER — BUPIVACAINE HCL (PF) 0.25 % IJ SOLN
INTRAMUSCULAR | Status: AC
  Filled 2015-09-10: qty 30

## 2015-09-10 MED ORDER — ALBUTEROL SULFATE (2.5 MG/3ML) 0.083% IN NEBU
2.5000 mg | INHALATION_SOLUTION | RESPIRATORY_TRACT | Status: DC | PRN
  Administered 2015-09-10 – 2015-09-11 (×2): 2.5 mg via RESPIRATORY_TRACT
  Filled 2015-09-10 (×2): qty 3

## 2015-09-10 MED ORDER — MIDAZOLAM HCL 5 MG/5ML IJ SOLN
INTRAMUSCULAR | Status: DC | PRN
  Administered 2015-09-10 (×4): 1 mg via INTRAVENOUS

## 2015-09-10 MED ORDER — METOPROLOL TARTRATE 25 MG PO TABS
25.0000 mg | ORAL_TABLET | Freq: Two times a day (BID) | ORAL | Status: DC
  Administered 2015-09-10: 25 mg via ORAL
  Filled 2015-09-10: qty 1

## 2015-09-10 MED ORDER — SODIUM CHLORIDE 0.9 % IV SOLN
INTRAVENOUS | Status: DC
  Administered 2015-09-10: 06:00:00 via INTRAVENOUS

## 2015-09-10 MED ORDER — BUPIVACAINE HCL (PF) 0.25 % IJ SOLN
INTRAMUSCULAR | Status: DC | PRN
  Administered 2015-09-10: 31 mL

## 2015-09-10 MED ORDER — HEPARIN (PORCINE) IN NACL 2-0.9 UNIT/ML-% IJ SOLN
INTRAMUSCULAR | Status: DC | PRN
Start: 2015-09-10 — End: 2015-09-10
  Administered 2015-09-10: 08:00:00

## 2015-09-10 MED ORDER — HEPARIN (PORCINE) IN NACL 2-0.9 UNIT/ML-% IJ SOLN
INTRAMUSCULAR | Status: AC
Start: 2015-09-10 — End: 2015-09-10
  Filled 2015-09-10: qty 500

## 2015-09-10 MED ORDER — HYDRALAZINE HCL 20 MG/ML IJ SOLN
INTRAMUSCULAR | Status: AC
  Filled 2015-09-10: qty 1

## 2015-09-10 MED ORDER — MIDAZOLAM HCL 5 MG/5ML IJ SOLN
INTRAMUSCULAR | Status: AC
  Filled 2015-09-10: qty 5

## 2015-09-10 MED ORDER — ONDANSETRON HCL 4 MG/2ML IJ SOLN: 4.0000 mg | Freq: Four times a day (QID) | INTRAMUSCULAR | Status: DC | PRN

## 2015-09-10 MED ORDER — MOMETASONE FURO-FORMOTEROL FUM 100-5 MCG/ACT IN AERO
2.0000 | INHALATION_SPRAY | Freq: Two times a day (BID) | RESPIRATORY_TRACT | Status: DC
  Administered 2015-09-10 – 2015-09-11 (×2): 2 via RESPIRATORY_TRACT
  Filled 2015-09-10: qty 8.8

## 2015-09-10 MED ORDER — FUROSEMIDE 10 MG/ML IJ SOLN
40.0000 mg | Freq: Once | INTRAMUSCULAR | Status: AC
  Administered 2015-09-10: 40 mg via INTRAVENOUS
  Filled 2015-09-10: qty 4

## 2015-09-10 MED ORDER — FENTANYL CITRATE (PF) 100 MCG/2ML IJ SOLN
INTRAMUSCULAR | Status: DC | PRN
  Administered 2015-09-10: 12.5 ug via INTRAVENOUS
  Administered 2015-09-10: 25 ug via INTRAVENOUS
  Administered 2015-09-10: 12.5 ug via INTRAVENOUS

## 2015-09-10 MED ORDER — GUAIFENESIN ER 600 MG PO TB12
1200.0000 mg | ORAL_TABLET | Freq: Two times a day (BID) | ORAL | Status: DC
  Administered 2015-09-10 – 2015-09-11 (×2): 1200 mg via ORAL
  Filled 2015-09-10 (×3): qty 2

## 2015-09-10 MED ORDER — SODIUM CHLORIDE 0.9 % IV SOLN: 250.0000 mL | INTRAVENOUS | Status: DC | PRN

## 2015-09-10 MED ORDER — ACETAMINOPHEN 325 MG PO TABS: 650.0000 mg | ORAL_TABLET | ORAL | Status: DC | PRN

## 2015-09-10 MED ORDER — FENTANYL CITRATE (PF) 100 MCG/2ML IJ SOLN
INTRAMUSCULAR | Status: AC
  Filled 2015-09-10: qty 2

## 2015-09-10 MED ORDER — ZOLPIDEM TARTRATE 5 MG PO TABS
5.0000 mg | ORAL_TABLET | Freq: Every evening | ORAL | Status: DC | PRN
  Administered 2015-09-10: 5 mg via ORAL
  Filled 2015-09-10: qty 1

## 2015-09-10 MED ORDER — PREDNISONE 20 MG PO TABS
40.0000 mg | ORAL_TABLET | Freq: Every day | ORAL | Status: DC
  Administered 2015-09-11: 40 mg via ORAL
  Filled 2015-09-10: qty 4

## 2015-09-10 SURGICAL SUPPLY — 11 items
BAG SNAP BAND KOVER 36X36 (MISCELLANEOUS) ×1 IMPLANT
CATH CELSIUS THERM D CV 7F (ABLATOR) ×1 IMPLANT
CATH HEX JOSEPH 2-5-2 65CM 6F (CATHETERS) ×1 IMPLANT
CATH JOSEPHSON QUAD-ALLRED 6FR (CATHETERS) ×2 IMPLANT
PACK EP LATEX FREE (CUSTOM PROCEDURE TRAY) ×2
PACK EP LF (CUSTOM PROCEDURE TRAY) IMPLANT
PAD DEFIB LIFELINK (PAD) ×1 IMPLANT
SHEATH PINNACLE 6F 10CM (SHEATH) ×2 IMPLANT
SHEATH PINNACLE 7F 10CM (SHEATH) ×1 IMPLANT
SHEATH PINNACLE 8F 10CM (SHEATH) ×1 IMPLANT
SHIELD RADPAD SCOOP 12X17 (MISCELLANEOUS) ×1 IMPLANT

## 2015-09-10 NOTE — Interval H&P Note (Signed)
History and Physical Interval Note:  09/10/2015 7:17 AM  George [name]  has presented today for surgery, with the diagnosis of svt  The various methods of treatment have been discussed with the patient and family. After consideration of risks, benefits and other options for treatment, the patient has consented to  Procedure(s): SVT Ablation (N/A) as a surgical intervention .  The patient's history has been reviewed, patient examined, no change in status, stable for surgery.  I have reviewed the patient's chart and labs.  Questions were answered to the patient's satisfaction.     Cristopher Peru

## 2015-09-10 NOTE — Progress Notes (Signed)
Patient to be discharged after 5:00PM per Dr. Lovena Le post procedure, once bedrest is completed and if groin remains stable after ambulation.  Patient feels well, denies any CP, palpitations or SOB, his procedure sites are stable. telmetry is SR 70-80's, O2 sats 97-100% on RA No changes to his home medicines, his BP is improved to 148/90, will continue his home medicines unchanged.  Tommye Standard, PA-C  EP Attending  Agree with above.   Mikle Bosworth.D.

## 2015-09-10 NOTE — Discharge Instructions (Signed)
No driving for 1 week. No lifting over 5 lbs for 1 week. No sexual activity for 1 week. You may return to work on 09/17/15. Keep procedure site clean & dry. If you notice increased pain, swelling, bleeding or pus, call/return!  You may shower, but no soaking baths/hot tubs/pools for 1 week.

## 2015-09-10 NOTE — Progress Notes (Signed)
Site area: Right Internal Jugular a 7 french venous sheath was removed  Site Prior to Removal:  Level 0  Pressure Applied For 10 MINUTES    Minutes Beginning at 1020a  Manual:   Yes.    Patient Status During Pull:  stable  Post Pull Groin Site:  Level 0  Post Pull Instructions Given:  Yes.    Post Pull Pulses Present:  Yes.    Dressing Applied:  Yes.    Comments:  BP 172/85 After BP med given.

## 2015-09-10 NOTE — Progress Notes (Signed)
As this RN was transporting the patient out via wheelchair, the patient reported to the PA, Tommye Standard, that he had been experiencing shortness of breath for several weeks which was interfering with his sleep especially.  Discharge cancelled and report giving to oncoming RN.

## 2015-09-10 NOTE — Progress Notes (Signed)
    SUBJECTIVE: The patient has new c/o SOB being reported, but states it is actually not a new problem for him.  He denies chest pain, or any other concerns. He will be given IV lasix and a CXR.  Marland Kitchen aspirin EC  81 mg Oral Daily  . furosemide  40 mg Intravenous Once  . guaiFENesin  1,200 mg Oral BID  . metoprolol tartrate  25 mg Oral BID  . mometasone-formoterol  2 puff Inhalation BID  . [START ON 09/11/2015] predniSONE  40 mg Oral Q breakfast  . sodium chloride flush  3 mL Intravenous Q12H      OBJECTIVE: Physical Exam: Filed Vitals:   09/10/15 1430 09/10/15 1530 09/10/15 1600 09/10/15 1700  BP: 173/81 167/80 157/83 151/88  Pulse: 91 88 89 80  Temp:      TempSrc:      Resp: 18 18 18 18   Height:      Weight:      SpO2: 96% 98% 99% 97%    Intake/Output Summary (Last 24 hours) at 09/10/15 1916 Last data filed at 09/10/15 1700  Gross per 24 hour  Intake    720 ml  Output      0 ml  Net    720 ml    Telemetry reveals  sinus rhythm (prior to monitor being removed)  GEN- The patient is well appearing, alert and oriented x 3 today.  Appears in NAD Head- normocephalic, atraumatic Eyes-  Sclera clear, conjunctiva pink Ears- hearing intact Oropharynx- clear Neck- supple, no JVP Lungs- diminshed at the bases slightly, soft end exp wheezes Heart- Regular rate and rhythm, no significant murmurs, no rubs or gallops GI- soft, NT, ND Extremities- no clubbing, cyanosis, 1+ edema Skin- no rash or lesion Psych- euthymic mood, full affect Neuro- no gross deficits appreciated  LABS: Basic Metabolic Panel:  Recent Labs  09/10/15 0628  NA 144  K 3.8  CL 106  CO2 28  GLUCOSE 103*  BUN 34*  CREATININE 1.16  CALCIUM 8.3*   CBC:  Recent Labs  09/10/15 0628  WBC 10.1  HGB 10.2*  HCT 32.2*  MCV 88.2  PLT 213     ASSESSMENT AND PLAN:  Active Problems:   SVT (supraventricular tachycardia) (HCC)  1. The patient upon his discharge verbalized feeling SOB      Reports  that he has been SOB for weeks, unchanged especially at night when trying to sleep      He was recently treated for COPD exacerbation by his PMD but reports this did not help      D/w Dr. Lovena Le, will give him IV lasix      Hold his discharge      Hold lopressor, the patient denies hx of HTN, metoprolol started secondary to his SVT      Follow BP  2. SVT     S/p EPS/ablation today with Dr. Hettie Holstein, PA-C 09/10/2015 7:16 PM  EP Attending  Patient seen and examined. Agree with above.   Mikle Bosworth.D.

## 2015-09-10 NOTE — H&P (View-Only) (Signed)
ELECTROPHYSIOLOGY CONSULT NOTE    Patient ID: George [name] MRN: [id number], DOB/AGE: 04-13-1951 65 y.o.  Admit date: 08/22/2015 Date of Consult: 08/23/2015   Primary Physician: [name], [place] Primary Cardiologist: Dr. [name]  Reason for Consultation: SVT  HPI: George [name] is a 65 y.o. 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  He has LE wounds that he is cared for at a wound care clinic.  He was seen by Dr. [name] after his ER visit in October with SVT and planned for a 30day monitor which he did without arrhythmias noted.  He cam to [facility] with increasing SOB, he originally went to [facility] 08/20/15 with SOB given steroids and duoneb, recommended to be admitted with abnormal Trop but he declined, yesterday 08/21/14 her came to [facility] with acutely worsening SOB and unable to lay down, sleeping the night prior sitting up in a chair.  Admitted by medicine service for further care and treatment for likely mixed CHF and COPD exacerbation (he is a longstanding heavy smoker, though quit 40mo ago), ARI, and elevated troponin (poc 0.08, I was 0.05 today) .  Cardiology service saw the patient and felt to be COPD, he did have another episode of ST here again responded to IV adenosine, his metoprolol was up-titrated and  EP was called to see him today.  This morning he feels much better, much less SOB, denies any CP, no palpitations since last evening.  No near syncope or syncope.  His SVT hx dates back about 8 years, being the first episode, he does not recall any particular trigger, laid down and it resolved in about 10-15 minutes.  He states about 2 years after another event,and then a couple years again after that a 3rd episode, none of these again does he recall any particular provoking and all self resolved without any particular symptoms otherwise.  In October this seemed to be a very prolonged episode, eventually  feeling very weak and actually brought to his knees, having little strength to get up and call 911, but denies full syncope.  He did have some CP with this event, but thinks this came afterwards, does not recall any particular trigger at home with this event either.  This admission he had significant SOB for a couple days first as described above.    Past Medical History  Diagnosis Date  . SVT (supraventricular tachycardia) ([place]) 05/22/2015  . GERD (gastroesophageal reflux disease)   . Panic attacks   . Basal cell carcinoma of right side of nose   . CHF (congestive heart failure) ([place])   . Berger's disease      Surgical History:  Past Surgical History  Procedure Laterality Date  . Fracture surgery    . Tonsillectomy and adenoidectomy  ~ 1955  . Knee arthroscopy Right ~ 1985  . Femur fracture surgery Right 1970    "had plate & pin put in"  . Femur hardware removal Right 1971    "removed plate; left pin in  . Basal cell carcinoma excision Right ~ 06/2015    "side of my nose"     Prescriptions prior to admission  Medication Sig Dispense Refill Last Dose  . aspirin EC 81 MG EC tablet Take 1 tablet (81 mg total) by mouth daily.   08/22/2015 at Unknown time  . metoprolol tartrate (LOPRESSOR) 25 MG tablet Take 0.5 tablets (12.5 mg total)  by mouth 2 (two) times daily. 180 tablet 3 08/22/2015 at 0030    Inpatient Medications:  . adenosine (ADENOCARD) IV  12 mg Intravenous Once  . aspirin EC  81 mg Oral Daily  . doxycycline  100 mg Oral Q12H  . enoxaparin (LOVENOX) injection  40 mg Subcutaneous Daily  . guaiFENesin  1,200 mg Oral BID  . ipratropium-albuterol  3 mL Nebulization BID  . metoprolol tartrate  25 mg Oral BID  . predniSONE  60 mg Oral Q breakfast  . sodium chloride  3 mL Intravenous Q12H    Allergies: No Known Allergies  Social History   Social History  . Marital Status: Divorced    Spouse Name: N/A  . Number of Children: 0  . Years of Education: 12    Occupational History  . Retired    Social History Main Topics  . Smoking status: Former Smoker -- 2.00 packs/day for 50 years    Types: Cigarettes    Quit date: 01/24/2015  . Smokeless tobacco: Never Used  . Alcohol Use: No  . Drug Use: No  . Sexual Activity: Not Currently   Other Topics Concern  . Not on file   Social History Narrative   Fun: Trade out work, outdoors   Denies religious beliefs effecting health care.      Family History  Problem Relation Age of Onset  . Heart disease Mother     Before age 89  . Heart failure Father   . Heart disease Father     After age 94  . Healthy Maternal Grandmother   . Healthy Maternal Grandfather   . Diabetes Paternal Grandmother   . Healthy Paternal Grandfather      Review of Systems: All other systems reviewed and are otherwise negative except as noted above.  Physical Exam: Filed Vitals:   08/23/15 0059 08/23/15 0455 08/23/15 0756 08/23/15 0904  BP: 118/73 136/77 143/78   Pulse: 75 78 73   Temp: 97.4 F (36.3 C) 97.7 F (36.5 C) 98.2 F (36.8 C)   TempSrc: Oral Oral Oral   Resp: 18 18 18    Height:      Weight:  235 lb 0.2 oz (106.6 kg)    SpO2: 95% 96% 99% 95%   GEN- The patient is thin, in NAD, ambulating in the room easily, alert and oriented x 3 today.   HEENT: normocephalic, atraumatic; sclera clear, conjunctiva pink; hearing intact; oropharynx clear; neck supple, no JVP, poor dentition Lymph- no cervical lymphadenopathy Lungs- some scatterred wheezes b/l, soft ronchi L>R Heart- Regular rate and rhythm, no murmurs, rubs or gallops, PMI not laterally displaced GI- soft, non-tender, non-distended Extremities- no clubbing, cyanosis, or edema MS- no significant deformity or atrophy Skin- warm and dry, no rash or lesion Psych- euthymic mood, full affect Neuro- no gross deficits observed  Labs:   Lab Results  Component Value Date   WBC 5.8 08/23/2015   HGB 9.2* 08/23/2015   HCT 29.2* 08/23/2015   MCV  86.9 08/23/2015   PLT 209 08/23/2015    Recent Labs Lab 08/23/15 0406  NA 141  K 5.1  CL 108  CO2 28  BUN 48*  CREATININE 1.36*  CALCIUM 8.0*  PROT 4.6*  BILITOT 0.5  ALKPHOS 55  ALT 16*  AST 25  GLUCOSE 119*      Radiology/Studies:  Dg Chest 2 View 08/22/2015  CLINICAL DATA:  65 year old male with shortness of breath and cough. EXAM: CHEST  2 VIEW COMPARISON:  Chest radiograph dated 08/20/2015 and CT dated 05/22/2015 FINDINGS: There is diffuse interstitial prominence concerning for edema. No focal consolidation, pleural effusion, or pneumothorax. Stable cardiac silhouette. The osseous structures appear unremarkable. IMPRESSION: Diffuse interstitial prominence likely edema. No focal consolidation. Electronically Signed   By: Anner Crete M.D.   On: 08/22/2015 01:44   Ct Angio Chest Pe W/cm &/or Wo Cm 08/22/2015  CLINICAL DATA:  65 year old male with shortness of breath, dizziness, and weakness. Concern for pulmonary embolism. EXAM: CT ANGIOGRAPHY CHEST WITH CONTRAST TECHNIQUE: Multidetector CT imaging of the chest was performed using the standard protocol during bolus administration of intravenous contrast. Multiplanar CT image reconstructions and MIPs were obtained to evaluate the vascular anatomy. CONTRAST:  18mL OMNIPAQUE IOHEXOL 350 MG/ML SOLN COMPARISON:  Chest radiograph dated 08/22/2015 FINDINGS: There is paraseptal emphysema. There are small bilateral pleural effusions. There is patchy areas of ground-glass airspace opacity predominantly at the lung bases. There is diffuse interstitial prominence and interlobular septal thickening compatible with congestive changes or edema. Superimposed pneumonia is not excluded. Clinical correlation and follow-up recommended. There is no pneumothorax. The central airways are patent. There is thickening of the central bronchi which may represent bronchopneumonia. The thoracic aorta appears unremarkable. No CT evidence of pulmonary embolism.  Bilateral hilar adenopathy. There is no cardiomegaly or pericardial effusion. There is coronary vascular calcification. The visualized esophagus appears unremarkable. No thyroid nodule identified. There is no axillary adenopathy. The chest wall soft tissues appear unremarkable there is mild degenerative changes of the spine. The osseous structures are intact. The visualized upper abdomen is unremarkable. Review of the MIP images confirms the above findings. IMPRESSION: No CT evidence of pulmonary embolism. Small bilateral pleural effusions with congestive changes or edema. Bilateral peribronchial thickening and ground-glass airspace haziness may represent bronchopneumonia. Clinical correlation and follow-up recommended. Electronically Signed   By: Anner Crete M.D.   On: 08/22/2015 06:00   Dg Chest Portable 1 View 08/20/2015  CLINICAL DATA:  Weakness, dizziness, shortness of breath starting yesterday EXAM: PORTABLE CHEST 1 VIEW COMPARISON:  05/22/2015 FINDINGS: Cardiomediastinal silhouette is stable. There is streaky interstitial prominence bilateral lower lobe left greater than right. Early pneumonitis or mild interstitial edema cannot be excluded. No pneumothorax or pleural effusion. Bony thorax is stable. IMPRESSION: There is streaky interstitial prominence bilateral lower lobe left greater than right. Early pneumonitis or mild interstitial edema cannot be excluded. Electronically Signed   By: Lahoma Crocker M.D.   On: 08/20/2015 12:13    EKG: #1 SR #2 SVT, rate 183  TELEMETRY: SR, SVT x1 event here 08/22/15: Echocardiogram Study Conclusions - Left ventricle: The cavity size was moderately dilated. Wall thickness was increased in a pattern of moderate LVH. Systolic function was mildly to moderately reduced. The estimated ejection fraction was in the range of 40% to 45%. Diffuse hypokinesis. - Aortic valve: Calcified non coronary cusp. - Mitral valve: There was mild regurgitation. - Atrial  septum: No defect or patent foramen ovale was identified.  05/23/15: Lexiscan stress myoview IMPRESSION: 1. Inferior wall attenuation on stress and rest imaging probably due to diaphragmatic attenuation given the lack of abnormal wall motion in this vicinity. Scarring of the inferior wall is a differential diagnostic consideration. No inducible ischemia. 2. Normal left ventricular wall motion. 3. Left ventricular ejection fraction 65% 4. Low-risk stress test findings*.  Assessment and Plan:  1. SVT      ER visit with CP/SVT treated with adenosine 05/22/15, had mild Trop elevation then with low risk stress myoview  SVT here yesterday converted to SR with Adenosine 6mg  x1      Started on BB in October with last SVT (12.5mg  BID)      Discussed with the patient medical therapy possibly EPS/Ablation option as well, will await for final POC pending discussion with Dr. Lovena Le.  2. SOB     COPD exacerbation continue with medicine service, on prednisone/doxycycline/duoneb/mucinex     (Nodular opacity RUL noted on prior CT not described on this admit)     +/- CHF  p.BNP was 465, s/p dose of IV lasix yesterday       3. Elevated TSH 5.375     Signed, Tommye Standard, PA-C 08/23/2015 11:32 AM  EP Attending  Patient seen and examined. I have reviewed the findings as noted above, made modifications where needed and represent my findings as well. CV reveals a RRR and lungs are clear with no wheezes and extremities have mild edema. He has COPD and presented with an exacerbation but now has SVT with rates of 180/min. Terminated with IV adenosine. I have discussed the risks/benefits/goals/expectations of EP study and catheter ablation and he wishes to proceed. This will be scheduled as an outpatient in the next few weeks. Ok to DC home tomorrow from cardiology perspective. Continue low dose metoprolol for now.   Mikle Bosworth.D.

## 2015-09-10 NOTE — Progress Notes (Addendum)
Site area: right groin a 8 french and 2 -6 french sheath was removed  Site Prior to Removal:  Level 0  Pressure Applied For 20 MINUTES    Minutes Beginning at 1000am  Manual:   Yes.    Patient Status During Pull:  stable  Post Pull Groin Site:  Level 0  Post Pull Instructions Given:  Yes.    Post Pull Pulses Present:  Yes.    Dressing Applied:  Yes.    Comments:  BP elevated and Hydralazine 10mg  given for BP [id number]  Repeated  For BP of 180/82

## 2015-09-11 ENCOUNTER — Ambulatory Visit (HOSPITAL_COMMUNITY): Payer: Medicare Other

## 2015-09-11 DIAGNOSIS — I509 Heart failure, unspecified: Secondary | ICD-10-CM | POA: Diagnosis not present

## 2015-09-11 DIAGNOSIS — K219 Gastro-esophageal reflux disease without esophagitis: Secondary | ICD-10-CM | POA: Diagnosis not present

## 2015-09-11 DIAGNOSIS — I110 Hypertensive heart disease with heart failure: Secondary | ICD-10-CM | POA: Diagnosis not present

## 2015-09-11 DIAGNOSIS — R0602 Shortness of breath: Secondary | ICD-10-CM | POA: Diagnosis not present

## 2015-09-11 DIAGNOSIS — J441 Chronic obstructive pulmonary disease with (acute) exacerbation: Secondary | ICD-10-CM | POA: Diagnosis not present

## 2015-09-11 DIAGNOSIS — N028 Recurrent and persistent hematuria with other morphologic changes: Secondary | ICD-10-CM | POA: Diagnosis not present

## 2015-09-11 DIAGNOSIS — I471 Supraventricular tachycardia: Secondary | ICD-10-CM | POA: Diagnosis not present

## 2015-09-11 MED ORDER — LISINOPRIL 5 MG PO TABS: 5.0000 mg | ORAL_TABLET | Freq: Every day | ORAL | Status: DC

## 2015-09-11 MED ORDER — FUROSEMIDE 40 MG PO TABS: 40.0000 mg | ORAL_TABLET | Freq: Every day | ORAL | Status: DC

## 2015-09-11 NOTE — Progress Notes (Signed)
Patient ID: JHONNIE PENO, male   DOB: Nov 06, 1950, 65 y.o.   MRN: [id number]    Patient Name: George [name] Date of Encounter: 09/11/2015     Active Problems:   SVT (supraventricular tachycardia) (HCC)    SUBJECTIVE  Dyspnea improved. Hasnt tried to walk yet. No chest pain. CXR reviewed  CURRENT MEDS . aspirin EC  81 mg Oral Daily  . guaiFENesin  1,200 mg Oral BID  . mometasone-formoterol  2 puff Inhalation BID  . predniSONE  40 mg Oral Q breakfast  . sodium chloride flush  3 mL Intravenous Q12H    OBJECTIVE  Filed Vitals:   09/10/15 1600 09/10/15 1700 09/10/15 2004 09/11/15 0534  BP: 157/83 151/88 163/93 168/94  Pulse: 89 80 86 90  Temp:   98.4 F (36.9 C) 98.5 F (36.9 C)  TempSrc:   Oral Oral  Resp: 18 18 18 18   Height:      Weight:      SpO2: 99% 97% 97% 97%    Intake/Output Summary (Last 24 hours) at 09/11/15 0800 Last data filed at 09/11/15 0600  Gross per 24 hour  Intake   1164 ml  Output   2600 ml  Net  -1436 ml   Filed Weights   09/10/15 0603  Weight: 235 lb (106.595 kg)    PHYSICAL EXAM  General: Pleasant, NAD. Neuro: Alert and oriented X 3. Moves all extremities spontaneously. Psych: Normal affect. HEENT:  Normal  Neck: Supple without bruits, JVD 6 cm. Lungs:  Resp regular and unlabored, CTA. Heart: RRR no s3, s4, or murmurs. Abdomen: Soft, non-tender, non-distended, BS + x 4.  Extremities: No clubbing, cyanosis or edema. DP/PT/Radials 2+ and equal bilaterally.  Accessory Clinical Findings  CBC  Recent Labs  09/10/15 0628  WBC 10.1  HGB 10.2*  HCT 32.2*  MCV 88.2  PLT 123456   Basic Metabolic Panel  Recent Labs  09/10/15 0628  NA 144  K 3.8  CL 106  CO2 28  GLUCOSE 103*  BUN 34*  CREATININE 1.16  CALCIUM 8.3*   Liver Function Tests No results for input(s): AST, ALT, ALKPHOS, BILITOT, PROT, ALBUMIN in the last 72 hours. No results for input(s): LIPASE, AMYLASE in the last 72 hours. Cardiac Enzymes No results for  input(s): CKTOTAL, CKMB, CKMBINDEX, TROPONINI in the last 72 hours. BNP Invalid input(s): POCBNP D-Dimer No results for input(s): DDIMER in the last 72 hours. Hemoglobin A1C No results for input(s): HGBA1C in the last 72 hours. Fasting Lipid Panel No results for input(s): CHOL, HDL, LDLCALC, TRIG, CHOLHDL, LDLDIRECT in the last 72 hours. Thyroid Function Tests No results for input(s): TSH, T4TOTAL, T3FREE, THYROIDAB in the last 72 hours.  Invalid input(s): FREET3  TELE  nsr  Radiology/Studies  Dg Chest 2 View  09/11/2015  CLINICAL DATA:  Cardiac catheterization.  Shortness of breath . EXAM: CHEST  2 VIEW COMPARISON:  CT 08/22/2015. Chest x-ray 08/22/2015, 08/20/2015, 05/22/2015. FINDINGS: Cardiomegaly with diffuse bilateral pulmonary interstitial prominence including Kerley B-lines. Component of pulmonary interstitial edema and/or pneumonitis present this fashion. Underlying chronic interstitial lung disease is also most likely present. Small bilateral pleural effusions cannot be excluded. No pneumothorax . IMPRESSION: Cardiomegaly with mild diffuse pulmonary interstitial prominence including Kerley B-lines. Small pleural effusions. Findings most consistent with congestive heart failure with mild interstitial edema. A component of underlying chronic interstitial lung disease is also most likely present . Electronically Signed   By: Marcello Moores  Register   On: 09/11/2015 07:27  Dg Chest 2 View  08/22/2015  CLINICAL DATA:  65 year old male with shortness of breath and cough. EXAM: CHEST  2 VIEW COMPARISON:  Chest radiograph dated 08/20/2015 and CT dated 05/22/2015 FINDINGS: There is diffuse interstitial prominence concerning for edema. No focal consolidation, pleural effusion, or pneumothorax. Stable cardiac silhouette. The osseous structures appear unremarkable. IMPRESSION: Diffuse interstitial prominence likely edema. No focal consolidation. Electronically Signed   By: Anner Crete M.D.   On:  08/22/2015 01:44   Ct Angio Chest Pe W/cm &/or Wo Cm  08/22/2015  CLINICAL DATA:  65 year old male with shortness of breath, dizziness, and weakness. Concern for pulmonary embolism. EXAM: CT ANGIOGRAPHY CHEST WITH CONTRAST TECHNIQUE: Multidetector CT imaging of the chest was performed using the standard protocol during bolus administration of intravenous contrast. Multiplanar CT image reconstructions and MIPs were obtained to evaluate the vascular anatomy. CONTRAST:  72mL OMNIPAQUE IOHEXOL 350 MG/ML SOLN COMPARISON:  Chest radiograph dated 08/22/2015 FINDINGS: There is paraseptal emphysema. There are small bilateral pleural effusions. There is patchy areas of ground-glass airspace opacity predominantly at the lung bases. There is diffuse interstitial prominence and interlobular septal thickening compatible with congestive changes or edema. Superimposed pneumonia is not excluded. Clinical correlation and follow-up recommended. There is no pneumothorax. The central airways are patent. There is thickening of the central bronchi which may represent bronchopneumonia. The thoracic aorta appears unremarkable. No CT evidence of pulmonary embolism. Bilateral hilar adenopathy. There is no cardiomegaly or pericardial effusion. There is coronary vascular calcification. The visualized esophagus appears unremarkable. No thyroid nodule identified. There is no axillary adenopathy. The chest wall soft tissues appear unremarkable there is mild degenerative changes of the spine. The osseous structures are intact. The visualized upper abdomen is unremarkable. Review of the MIP images confirms the above findings. IMPRESSION: No CT evidence of pulmonary embolism. Small bilateral pleural effusions with congestive changes or edema. Bilateral peribronchial thickening and ground-glass airspace haziness may represent bronchopneumonia. Clinical correlation and follow-up recommended. Electronically Signed   By: Anner Crete M.D.   On:  08/22/2015 06:00   Dg Chest Portable 1 View  08/20/2015  CLINICAL DATA:  Weakness, dizziness, shortness of breath starting yesterday EXAM: PORTABLE CHEST 1 VIEW COMPARISON:  05/22/2015 FINDINGS: Cardiomediastinal silhouette is stable. There is streaky interstitial prominence bilateral lower lobe left greater than right. Early pneumonitis or mild interstitial edema cannot be excluded. No pneumothorax or pleural effusion. Bony thorax is stable. IMPRESSION: There is streaky interstitial prominence bilateral lower lobe left greater than right. Early pneumonitis or mild interstitial edema cannot be excluded. Electronically Signed   By: Lahoma Crocker M.D.   On: 08/20/2015 12:13    ASSESSMENT AND PLAN  1. 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 No additional arrhythmias. Anticipate stopping his beta blocker. Usual followup. 2. Dyspnea - likely multifactorial. His CXR suggests pulmonary congestion as well as interstitial lung disease. He has had a nice diuresis with lasix. Will ambulate and dc home. He will need oral lasix. No need for his beta blocker at discharge. If he desats with exertion, he will need home oxygen.  Gregg Taylor,M.D.  09/11/2015 8:00 AM

## 2015-09-11 NOTE — Progress Notes (Signed)
SATURATION QUALIFICATIONS: (This note is used to comply with regulatory documentation for home oxygen)  Patient Saturations on Room Air at Rest = 92%  Patient Saturations on Room Air while Ambulating = 92-93%. Patient ambulated 650 feet at a moderate pace.  He denied shortness of breath while ambulating.  Pulse 116 during walk.  Patient remained on room air at the conclusion of the walk.

## 2015-09-11 NOTE — Discharge Summary (Signed)
ELECTROPHYSIOLOGY PROCEDURE DISCHARGE SUMMARY    Patient ID: George [name],  MRN: [id number], DOB/AGE: 1951-06-26 65 y.o.  Admit date: 09/10/2015 Discharge date: 09/11/2015  Primary Care Physician: [name], [place] Primary Cardiologist: Dr. [name] Electrophysiologist: Dr. Lovena Le  Primary Discharge Diagnosis:  SVT  Secondary Discharge Diagnosis:  COPD HTN  No Known Allergies   Procedures This Admission:  09/10/15: EPS, ablation CONCLUSIONS:  1. Sinus rhythm upon presentation.  2. The patient had dual AV nodal physiology with easily inducible classic AV nodal reentrant tachycardia, there were no other accessory pathways or arrhythmias induced  3. Successful radiofrequency modification of the slow AV nodal pathway  4. No inducible arrhythmias following ablation.  5. No early apparent complications.   Brief HPI: KAMAURION DEDES is a 65 y.o. male was seen during his previous hospital stay where he was treated for SVT, he presented yesterday for elective EPS/ablation with Dr. Lovena Le.   Past medical history includes PSVT, COPD, CHF, and note HTN.   Risks, benefits, and alternatives to the procedure were reviewed with the patient who wished to proceed.   Hospital Course:  The patient was admitted and underwent EPS, ablation of AVNRT with details as outlined above.He was planned for discharge last evening, though onhis way out mentioned he was feeling increased SOB, and recently treated for COPD exacerbation by his PMD, he was held overnight, treated with IV lasix with marked improvement in his symptoms.  He walked today on RA with sats 92-93%, and no c/o SOB, his CXR was reviewed by Dr. Lovena Le, who also saw and examined the patient, felt to be stable for discharge.  He  was monitored on telemetry overnight which demonstrated SR only.   We stopped his lopressor with some concerns of possible brochospasm given his COPD. his BP remained elevated here and will start lisinopril  5mg  daily, increase his lasix to 40mg  daily.  He is given activity instructions and f/u visit arranged.  The patient feels well, no rest SOB, denied DOE this afternoon, no pain at his procedure sites, no CP or palpitations.  Physical Exam: Filed Vitals:   09/10/15 1700 09/10/15 2004 09/11/15 0534 09/11/15 0847  BP: 151/88 163/93 168/94   Pulse: 80 86 90   Temp:  98.4 F (36.9 C) 98.5 F (36.9 C)   TempSrc:  Oral Oral   Resp: 18 18 18    Height:      Weight:      SpO2: 97% 97% 97% 96%    GEN- The patient is well appearing, alert and oriented x 3 today.   HEENT: normocephalic, atraumatic; sclera clear, conjunctiva pink; hearing intact; oropharynx clear; neck supple, no JVP Lymph- no cervical lymphadenopathy Lungs- Clear to ausculation bilaterally, normal work of breathing.  No wheezes, rales, rhonchi Heart- Regular rate and rhythm, no murmurs, rubs or gallops, PMI not laterally displaced GI- soft, non-tender, non-distended Extremities- no clubbing, cyanosis, trace edema 3+ pedal pulses b/l MS- no significant deformity or atrophy Skin- warm and dry, no rash or lesion Psych- euthymic mood, full affect Neuro- no gross deficits Procedure sites are stable, no bleeding, no hematoma  Labs:   Lab Results  Component Value Date   WBC 10.1 09/10/2015   HGB 10.2* 09/10/2015   HCT 32.2* 09/10/2015   MCV 88.2 09/10/2015   PLT 213 09/10/2015     Recent Labs Lab 09/10/15 0628  NA 144  K 3.8  CL 106  CO2 28  BUN 34*  CREATININE 1.16  CALCIUM 8.3*  GLUCOSE 103*    Discharge Medications:    Medication List    STOP taking these medications        metoprolol tartrate 25 MG tablet  Commonly known as:  LOPRESSOR      TAKE these medications        aspirin 81 MG EC tablet  Take 1 tablet (81 mg total) by mouth daily.     furosemide 40 MG tablet  Commonly known as:  LASIX  Take 1 tablet (40 mg total) by mouth daily.     guaiFENesin 600 MG 12 hr tablet  Commonly known as:   MUCINEX  Take 2 tablets (1,200 mg total) by mouth 2 (two) times daily.     levalbuterol 45 MCG/ACT inhaler  Commonly known as:  XOPENEX HFA  Inhale 1-2 puffs into the lungs every 4 (four) hours as needed for wheezing.     lisinopril 5 MG tablet  Commonly known as:  PRINIVIL  Take 1 tablet (5 mg total) by mouth daily.     mometasone-formoterol 100-5 MCG/ACT Aero  Commonly known as:  DULERA  Inhale 2 puffs into the lungs 2 (two) times daily.     predniSONE 20 MG tablet  Commonly known as:  DELTASONE  Take 2 tablets (40 mg total) by mouth daily with breakfast.     zolpidem 5 MG tablet  Commonly known as:  AMBIEN  Take 1 tablet (5 mg total) by mouth at bedtime as needed for sleep.        Disposition: Home  Discharge Instructions    Diet - low sodium heart healthy    Complete by:  As directed      Diet - low sodium heart healthy    Complete by:  As directed      Increase activity slowly    Complete by:  As directed      Increase activity slowly    Complete by:  As directed           Follow-up Information    Follow up with Patsey Berthold, NP On 09/26/2015.   Specialty:  Cardiology   Why:  1:20PM   Contact information:   [place] [address] [phone]       Duration of Discharge Encounter: Greater than 30 minutes including physician time.  Signed, Tommye Standard, PA-C 09/11/2015 1:10 PM  EP Attending  Patient seen and examined. Agree with the findings as noted above. He is stable for discharge.  Mikle Bosworth.D.

## 2015-09-12 ENCOUNTER — Telehealth: Payer: Self-pay | Admitting: *Deleted

## 2015-09-12 DIAGNOSIS — N189 Chronic kidney disease, unspecified: Secondary | ICD-10-CM

## 2015-09-12 HISTORY — DX: Chronic kidney disease, unspecified: N18.9

## 2015-09-12 NOTE — Telephone Encounter (Signed)
Pt was on TCM list admitted for SVT. COPD, and HTN. D/C 1/31, and will be f/u with cardiology 09/26/15,,,/lmb

## 2015-09-20 ENCOUNTER — Ambulatory Visit (INDEPENDENT_AMBULATORY_CARE_PROVIDER_SITE_OTHER): Payer: Medicare Other | Admitting: Physician Assistant

## 2015-09-20 ENCOUNTER — Telehealth: Payer: Self-pay | Admitting: Internal Medicine

## 2015-09-20 ENCOUNTER — Encounter: Payer: Self-pay | Admitting: Physician Assistant

## 2015-09-20 ENCOUNTER — Inpatient Hospital Stay (HOSPITAL_COMMUNITY)
Admission: AD | Admit: 2015-09-20 | Discharge: 2015-09-30 | DRG: 699 | Disposition: A | Discharge status: Home Health | Payer: Medicare Other | Source: Ambulatory Visit | Attending: Internal Medicine | Admitting: Internal Medicine

## 2015-09-20 VITALS — BP 144/80 | HR 86 | Ht 76.0 in | Wt 247.0 lb

## 2015-09-20 DIAGNOSIS — I872 Venous insufficiency (chronic) (peripheral): Secondary | ICD-10-CM | POA: Diagnosis not present

## 2015-09-20 DIAGNOSIS — Z8249 Family history of ischemic heart disease and other diseases of the circulatory system: Secondary | ICD-10-CM | POA: Diagnosis not present

## 2015-09-20 DIAGNOSIS — J9600 Acute respiratory failure, unspecified whether with hypoxia or hypercapnia: Secondary | ICD-10-CM | POA: Diagnosis not present

## 2015-09-20 DIAGNOSIS — N028 Recurrent and persistent hematuria with other morphologic changes: Secondary | ICD-10-CM | POA: Diagnosis present

## 2015-09-20 DIAGNOSIS — I110 Hypertensive heart disease with heart failure: Secondary | ICD-10-CM | POA: Diagnosis present

## 2015-09-20 DIAGNOSIS — Z7982 Long term (current) use of aspirin: Secondary | ICD-10-CM | POA: Diagnosis not present

## 2015-09-20 DIAGNOSIS — N049 Nephrotic syndrome with unspecified morphologic changes: Principal | ICD-10-CM

## 2015-09-20 DIAGNOSIS — K219 Gastro-esophageal reflux disease without esophagitis: Secondary | ICD-10-CM | POA: Diagnosis present

## 2015-09-20 DIAGNOSIS — Z85828 Personal history of other malignant neoplasm of skin: Secondary | ICD-10-CM

## 2015-09-20 DIAGNOSIS — E039 Hypothyroidism, unspecified: Secondary | ICD-10-CM | POA: Diagnosis present

## 2015-09-20 DIAGNOSIS — E876 Hypokalemia: Secondary | ICD-10-CM | POA: Diagnosis not present

## 2015-09-20 DIAGNOSIS — R948 Abnormal results of function studies of other organs and systems: Secondary | ICD-10-CM | POA: Diagnosis not present

## 2015-09-20 DIAGNOSIS — R7989 Other specified abnormal findings of blood chemistry: Secondary | ICD-10-CM | POA: Diagnosis not present

## 2015-09-20 DIAGNOSIS — I471 Supraventricular tachycardia: Secondary | ICD-10-CM | POA: Diagnosis not present

## 2015-09-20 DIAGNOSIS — Z7951 Long term (current) use of inhaled steroids: Secondary | ICD-10-CM

## 2015-09-20 DIAGNOSIS — I878 Other specified disorders of veins: Secondary | ICD-10-CM | POA: Diagnosis present

## 2015-09-20 DIAGNOSIS — R05 Cough: Secondary | ICD-10-CM | POA: Diagnosis not present

## 2015-09-20 DIAGNOSIS — I7143 Infrarenal abdominal aortic aneurysm, without rupture: Secondary | ICD-10-CM

## 2015-09-20 DIAGNOSIS — R739 Hyperglycemia, unspecified: Secondary | ICD-10-CM | POA: Diagnosis present

## 2015-09-20 DIAGNOSIS — E0781 Sick-euthyroid syndrome: Secondary | ICD-10-CM | POA: Diagnosis present

## 2015-09-20 DIAGNOSIS — I10 Essential (primary) hypertension: Secondary | ICD-10-CM

## 2015-09-20 DIAGNOSIS — R0902 Hypoxemia: Secondary | ICD-10-CM | POA: Diagnosis present

## 2015-09-20 DIAGNOSIS — I5023 Acute on chronic systolic (congestive) heart failure: Secondary | ICD-10-CM

## 2015-09-20 DIAGNOSIS — L97929 Non-pressure chronic ulcer of unspecified part of left lower leg with unspecified severity: Secondary | ICD-10-CM | POA: Diagnosis present

## 2015-09-20 DIAGNOSIS — N170 Acute kidney failure with tubular necrosis: Secondary | ICD-10-CM | POA: Diagnosis not present

## 2015-09-20 DIAGNOSIS — R042 Hemoptysis: Secondary | ICD-10-CM | POA: Diagnosis present

## 2015-09-20 DIAGNOSIS — R161 Splenomegaly, not elsewhere classified: Secondary | ICD-10-CM | POA: Diagnosis present

## 2015-09-20 DIAGNOSIS — T502X5A Adverse effect of carbonic-anhydrase inhibitors, benzothiadiazides and other diuretics, initial encounter: Secondary | ICD-10-CM | POA: Diagnosis present

## 2015-09-20 DIAGNOSIS — R0600 Dyspnea, unspecified: Secondary | ICD-10-CM | POA: Diagnosis not present

## 2015-09-20 DIAGNOSIS — J441 Chronic obstructive pulmonary disease with (acute) exacerbation: Secondary | ICD-10-CM | POA: Diagnosis not present

## 2015-09-20 DIAGNOSIS — J449 Chronic obstructive pulmonary disease, unspecified: Secondary | ICD-10-CM | POA: Diagnosis present

## 2015-09-20 DIAGNOSIS — N289 Disorder of kidney and ureter, unspecified: Secondary | ICD-10-CM | POA: Diagnosis not present

## 2015-09-20 DIAGNOSIS — J9621 Acute and chronic respiratory failure with hypoxia: Secondary | ICD-10-CM

## 2015-09-20 DIAGNOSIS — D808 Other immunodeficiencies with predominantly antibody defects: Secondary | ICD-10-CM

## 2015-09-20 DIAGNOSIS — I5022 Chronic systolic (congestive) heart failure: Secondary | ICD-10-CM | POA: Diagnosis present

## 2015-09-20 DIAGNOSIS — I4719 Other supraventricular tachycardia: Secondary | ICD-10-CM | POA: Diagnosis not present

## 2015-09-20 DIAGNOSIS — Z992 Dependence on renal dialysis: Secondary | ICD-10-CM | POA: Diagnosis not present

## 2015-09-20 DIAGNOSIS — M7989 Other specified soft tissue disorders: Secondary | ICD-10-CM | POA: Diagnosis present

## 2015-09-20 DIAGNOSIS — N178 Other acute kidney failure: Secondary | ICD-10-CM | POA: Diagnosis not present

## 2015-09-20 DIAGNOSIS — Z833 Family history of diabetes mellitus: Secondary | ICD-10-CM

## 2015-09-20 DIAGNOSIS — N171 Acute kidney failure with acute cortical necrosis: Secondary | ICD-10-CM | POA: Diagnosis not present

## 2015-09-20 DIAGNOSIS — R809 Proteinuria, unspecified: Secondary | ICD-10-CM | POA: Diagnosis not present

## 2015-09-20 DIAGNOSIS — I509 Heart failure, unspecified: Secondary | ICD-10-CM | POA: Diagnosis not present

## 2015-09-20 DIAGNOSIS — Z87891 Personal history of nicotine dependence: Secondary | ICD-10-CM | POA: Diagnosis not present

## 2015-09-20 DIAGNOSIS — I120 Hypertensive chronic kidney disease with stage 5 chronic kidney disease or end stage renal disease: Secondary | ICD-10-CM | POA: Diagnosis not present

## 2015-09-20 DIAGNOSIS — N179 Acute kidney failure, unspecified: Secondary | ICD-10-CM | POA: Diagnosis present

## 2015-09-20 DIAGNOSIS — R059 Cough, unspecified: Secondary | ICD-10-CM

## 2015-09-20 DIAGNOSIS — I714 Abdominal aortic aneurysm, without rupture: Secondary | ICD-10-CM | POA: Diagnosis present

## 2015-09-20 DIAGNOSIS — R778 Other specified abnormalities of plasma proteins: Secondary | ICD-10-CM

## 2015-09-20 DIAGNOSIS — N186 End stage renal disease: Secondary | ICD-10-CM | POA: Diagnosis not present

## 2015-09-20 DIAGNOSIS — K7460 Unspecified cirrhosis of liver: Secondary | ICD-10-CM

## 2015-09-20 DIAGNOSIS — D8989 Other specified disorders involving the immune mechanism, not elsewhere classified: Secondary | ICD-10-CM

## 2015-09-20 DIAGNOSIS — N059 Unspecified nephritic syndrome with unspecified morphologic changes: Secondary | ICD-10-CM

## 2015-09-20 DIAGNOSIS — B1920 Unspecified viral hepatitis C without hepatic coma: Secondary | ICD-10-CM | POA: Diagnosis present

## 2015-09-20 HISTORY — DX: Abdominal aortic aneurysm, without rupture: I71.4

## 2015-09-20 HISTORY — DX: Abdominal aortic aneurysm, without rupture, unspecified: I71.40

## 2015-09-20 LAB — COMPREHENSIVE METABOLIC PANEL
ALK PHOS: 56 U/L (ref 38–126)
ALT: 17 U/L (ref 17–63)
ANION GAP: 14 (ref 5–15)
AST: 40 U/L (ref 15–41)
Albumin: 2.2 g/dL — ABNORMAL LOW (ref 3.5–5.0)
BILIRUBIN TOTAL: 0.8 mg/dL (ref 0.3–1.2)
BUN: 31 mg/dL — ABNORMAL HIGH (ref 6–20)
CALCIUM: 8 mg/dL — AB (ref 8.9–10.3)
CO2: 31 mmol/L (ref 22–32)
CREATININE: 1.46 mg/dL — AB (ref 0.61–1.24)
Chloride: 98 mmol/L — ABNORMAL LOW (ref 101–111)
GFR, EST AFRICAN AMERICAN: 56 mL/min — AB (ref >60)
GFR, EST NON AFRICAN AMERICAN: 48 mL/min — AB (ref >60)
Glucose, Bld: 108 mg/dL — ABNORMAL HIGH (ref 65–99)
Potassium: 3.4 mmol/L — ABNORMAL LOW (ref 3.5–5.1)
SODIUM: 143 mmol/L (ref 135–145)
TOTAL PROTEIN: 4.4 g/dL — AB (ref 6.5–8.1)

## 2015-09-20 LAB — CBC WITH DIFFERENTIAL/PLATELET
BASOS PCT: 1 %
Basophils Absolute: 0.1 10*3/uL (ref 0.0–0.1)
EOS ABS: 0 10*3/uL (ref 0.0–0.7)
EOS PCT: 0 %
HCT: 31.4 % — ABNORMAL LOW (ref 39.0–52.0)
HEMOGLOBIN: 10 g/dL — AB (ref 13.0–17.0)
LYMPHS PCT: 12 %
Lymphs Abs: 0.9 10*3/uL (ref 0.7–4.0)
MCH: 27.9 pg (ref 26.0–34.0)
MCHC: 31.8 g/dL (ref 30.0–36.0)
MCV: 87.7 fL (ref 78.0–100.0)
MONO ABS: 0.2 10*3/uL (ref 0.1–1.0)
Monocytes Relative: 3 %
NEUTROS PCT: 84 %
Neutro Abs: 6.7 10*3/uL (ref 1.7–7.7)
PLATELETS: 202 10*3/uL (ref 150–400)
RBC: 3.58 MIL/uL — AB (ref 4.22–5.81)
RDW: 17.2 % — ABNORMAL HIGH (ref 11.5–15.5)
WBC: 7.9 10*3/uL (ref 4.0–10.5)

## 2015-09-20 LAB — MAGNESIUM: Magnesium: 2 mg/dL (ref 1.7–2.4)

## 2015-09-20 LAB — PROTIME-INR
INR: 1.08 (ref 0.00–1.49)
PROTHROMBIN TIME: 14.2 s (ref 11.6–15.2)

## 2015-09-20 LAB — TSH: TSH: 4.86 u[IU]/mL — AB (ref 0.350–4.500)

## 2015-09-20 LAB — TROPONIN I
Troponin I: 0.15 ng/mL — ABNORMAL HIGH (ref <0.031)
Troponin I: 0.15 ng/mL — ABNORMAL HIGH (ref <0.031)

## 2015-09-20 LAB — BRAIN NATRIURETIC PEPTIDE: B Natriuretic Peptide: 980.5 pg/mL — ABNORMAL HIGH (ref 0.0–100.0)

## 2015-09-20 LAB — APTT: aPTT: 28 seconds (ref 24–37)

## 2015-09-20 MED ORDER — POTASSIUM CHLORIDE CRYS ER 10 MEQ PO TBCR
10.0000 meq | EXTENDED_RELEASE_TABLET | Freq: Two times a day (BID) | ORAL | Status: DC
  Administered 2015-09-20 – 2015-09-22 (×4): 10 meq via ORAL
  Filled 2015-09-20 (×4): qty 1

## 2015-09-20 MED ORDER — ENOXAPARIN SODIUM 40 MG/0.4ML ~~LOC~~ SOLN
40.0000 mg | SUBCUTANEOUS | Status: DC
  Administered 2015-09-20 – 2015-09-24 (×5): 40 mg via SUBCUTANEOUS
  Filled 2015-09-20 (×5): qty 0.4

## 2015-09-20 MED ORDER — ASPIRIN EC 81 MG PO TBEC
81.0000 mg | DELAYED_RELEASE_TABLET | Freq: Every day | ORAL | Status: DC
  Administered 2015-09-21 – 2015-09-30 (×9): 81 mg via ORAL
  Filled 2015-09-20 (×9): qty 1

## 2015-09-20 MED ORDER — PIPERACILLIN-TAZOBACTAM 3.375 G IVPB
3.3750 g | Freq: Three times a day (TID) | INTRAVENOUS | Status: DC
  Administered 2015-09-20 – 2015-09-23 (×9): 3.375 g via INTRAVENOUS
  Filled 2015-09-20 (×11): qty 50

## 2015-09-20 MED ORDER — ACETAMINOPHEN 325 MG PO TABS
650.0000 mg | ORAL_TABLET | ORAL | Status: DC | PRN
  Administered 2015-09-21 – 2015-09-28 (×8): 650 mg via ORAL
  Filled 2015-09-20 (×10): qty 2

## 2015-09-20 MED ORDER — ZOLPIDEM TARTRATE 5 MG PO TABS
5.0000 mg | ORAL_TABLET | Freq: Every evening | ORAL | Status: DC | PRN
  Administered 2015-09-26 – 2015-09-30 (×4): 5 mg via ORAL
  Filled 2015-09-20 (×4): qty 1

## 2015-09-20 MED ORDER — LISINOPRIL 5 MG PO TABS
5.0000 mg | ORAL_TABLET | Freq: Every day | ORAL | Status: DC
  Administered 2015-09-21 – 2015-09-22 (×2): 5 mg via ORAL
  Filled 2015-09-20 (×2): qty 1

## 2015-09-20 MED ORDER — MOMETASONE FURO-FORMOTEROL FUM 100-5 MCG/ACT IN AERO
2.0000 | INHALATION_SPRAY | Freq: Two times a day (BID) | RESPIRATORY_TRACT | Status: DC
  Administered 2015-09-20 – 2015-09-30 (×20): 2 via RESPIRATORY_TRACT
  Filled 2015-09-20 (×2): qty 8.8

## 2015-09-20 MED ORDER — ENOXAPARIN SODIUM 30 MG/0.3ML ~~LOC~~ SOLN: 30.0000 mg | Freq: Two times a day (BID) | SUBCUTANEOUS | Status: DC

## 2015-09-20 MED ORDER — LEVALBUTEROL HCL 0.63 MG/3ML IN NEBU
0.6300 mg | INHALATION_SOLUTION | RESPIRATORY_TRACT | Status: DC | PRN
  Administered 2015-09-21: 0.63 mg via RESPIRATORY_TRACT
  Filled 2015-09-20: qty 3

## 2015-09-20 MED ORDER — SODIUM CHLORIDE 0.9 % IV SOLN: 250.0000 mL | INTRAVENOUS | Status: DC | PRN

## 2015-09-20 MED ORDER — FUROSEMIDE 10 MG/ML IJ SOLN
40.0000 mg | Freq: Two times a day (BID) | INTRAMUSCULAR | Status: DC
  Administered 2015-09-20 – 2015-09-21 (×2): 40 mg via INTRAVENOUS
  Filled 2015-09-20 (×2): qty 4

## 2015-09-20 MED ORDER — ONDANSETRON HCL 4 MG/2ML IJ SOLN: 4.0000 mg | Freq: Four times a day (QID) | INTRAMUSCULAR | Status: DC | PRN

## 2015-09-20 MED ORDER — SODIUM CHLORIDE 0.9% FLUSH: 3.0000 mL | INTRAVENOUS | Status: DC | PRN

## 2015-09-20 MED ORDER — SODIUM CHLORIDE 0.9% FLUSH
3.0000 mL | Freq: Two times a day (BID) | INTRAVENOUS | Status: DC
  Administered 2015-09-20 – 2015-09-30 (×16): 3 mL via INTRAVENOUS

## 2015-09-20 MED ORDER — GUAIFENESIN ER 600 MG PO TB12
1200.0000 mg | ORAL_TABLET | Freq: Two times a day (BID) | ORAL | Status: DC
  Administered 2015-09-20 – 2015-09-30 (×19): 1200 mg via ORAL
  Filled 2015-09-20 (×19): qty 2

## 2015-09-20 NOTE — Telephone Encounter (Signed)
[facility] primary cardiologist.  I spoke with the pt and he complains of new swelling in his right leg that developed in the past 2-3 days.  The has red splotches and can barely walk due to swelling.  The pt said he normally has swelling in his left foot and he has a wound that is followed by wound care.  The pt also describes his lower extremities as cold and numb which are NOT new for the pt.  The pt has also noticed increased SOB over the last few days. Currently the pt takes Furosemide 40mg  daily. Reviewed chart and the right femoral vein was used during 09/10/2015 ablation. I have scheduled the pt to be seen in the office today for further evaluation of his symptoms.

## 2015-09-20 NOTE — Progress Notes (Signed)
Cardiology Office Note:    Date:  09/20/2015   ID:  George [name], DOB 03-25-1951, MRN [id number]  PCP:  [name], [place]  Cardiologist:  Dr. Quay Burow   Electrophysiologist:  Dr. Cristopher Peru   Chief Complaint  Patient presents with  . Leg Swelling    History of Present Illness:     George [name] is a 65 y.o. male with a hx of PSVT, COPD, systolic CHF, DCM, HTN, chronic LE extremity edema from venous insufficiency with assoc venous stasis ulcers.  He was admitted in 10/16 with SVT and elevated Troponin levels.  Myoview was low risk at that time. He was admitted 1/17 with AECOPD. He had recurrent SVT on the floor that converted with IV Adenosine.  He was seen by EP and RFCA was recommended.  Of note, echo at that time demonstrated EF 40-45% with diffuse HK. 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 Cristopher Peru. He required IV Lasix prior to DC due to worsening dyspnea.  Metoprolol was DC'd.    He called in today with complaints of worsening edema. He is added on for evaluation.  He notes worsening dyspnea and edema over the past week.  He has gained 9 lbs in the last 2 days on his scales at home.  He is short of breath at rest.  He notes + orthopnea, + PND.  Denies chest pain or syncope.  He notes cough with yellow sputum and occasional blood.  Denies fevers.  Denies melena, hematochezia, vomiting, diarrhea. He no longer smokes.    Past Medical History  Diagnosis Date  . SVT (supraventricular tachycardia) ([place]) 05/22/2015  . GERD (gastroesophageal reflux disease)   . Panic attacks   . Basal cell carcinoma of right side of nose   . CHF (congestive heart failure) ([place])   . Berger's disease     Past Surgical History  Procedure Laterality Date  . Fracture surgery    . Tonsillectomy and adenoidectomy  ~ 1955  . Knee arthroscopy Right ~ 1985  . Femur fracture surgery Right 1970    "had plate & pin put in"  . Femur hardware removal Right 1971    "removed plate; left  pin in  . Basal cell carcinoma excision Right ~ 06/2015    "side of my nose"  . Electrophysiologic study N/A 09/10/2015    Procedure: SVT Ablation;  Surgeon: Evans Lance, MD;  Location: [facility];  Service: Cardiovascular;  Laterality: N/A;    Current Medications: No facility-administered medications prior to visit.   Outpatient Prescriptions Prior to Visit  Medication Sig Dispense Refill  . aspirin EC 81 MG EC tablet Take 1 tablet (81 mg total) by mouth daily.    . furosemide (LASIX) 40 MG tablet Take 1 tablet (40 mg total) by mouth daily. 30 tablet 3  . guaiFENesin (MUCINEX) 600 MG 12 hr tablet Take 2 tablets (1,200 mg total) by mouth 2 (two) times daily. 14 tablet 0  . levalbuterol (XOPENEX HFA) 45 MCG/ACT inhaler Inhale 1-2 puffs into the lungs every 4 (four) hours as needed for wheezing. 1 Inhaler 12  . lisinopril (PRINIVIL,ZESTRIL) 5 MG tablet Take 1 tablet (5 mg total) by mouth daily. 30 tablet 3  . mometasone-formoterol (DULERA) 100-5 MCG/ACT AERO Inhale 2 puffs into the lungs 2 (two) times daily. 8.8 g 6  . zolpidem (AMBIEN) 5 MG tablet Take 1 tablet (5 mg total) by mouth at bedtime as needed for sleep.  15 tablet 1  . predniSONE (DELTASONE) 20 MG tablet Take 2 tablets (40 mg total) by mouth daily with breakfast. (Patient not taking: Reported on 09/20/2015) 14 tablet 0     Allergies:   Review of patient's allergies indicates no known allergies.   Social History   Social History  . Marital Status: Divorced    Spouse Name: N/A  . Number of Children: 0  . Years of Education: 12   Occupational History  . Retired    Social History Main Topics  . Smoking status: Former Smoker -- 2.00 packs/day for 50 years    Types: Cigarettes    Quit date: 01/24/2015  . Smokeless tobacco: Never Used  . Alcohol Use: No  . Drug Use: No  . Sexual Activity: Not Currently   Other Topics Concern  . None   Social History Narrative   Fun: Trade out work, outdoors   Denies  religious beliefs effecting health care.      Family History:  The patient's family history includes Diabetes in his paternal grandmother; Healthy in his maternal grandfather, maternal grandmother, and paternal grandfather; Heart disease in his father and mother; Heart failure in his father.   ROS:   Please see the history of present illness.    Review of Systems  Cardiovascular: Positive for dyspnea on exertion.  All other systems reviewed and are negative.   Physical Exam:    VS:  BP 144/80 mmHg  Pulse 86  Ht 6\' 4"  (1.93 m)  Wt 247 lb (112.038 kg)  BMI 30.08 kg/m2  SpO2 85%   GEN: Well nourished, well developed, in no acute distress HEENT: normal Neck: difficult to assess JVD (appears elevated to jaw) Cardiac: distant S1, S2; RRR; I cannot appreciate murmurs, 1+ bilateral LE edema    Respiratory: bibasilar rales, exp rhonchi throughout GI:  Distended  MS: no deformity or atrophy Skin: warm and dry  Neuro:   no focal deficits  Psych: Alert and oriented x 3, normal affect  Wt Readings from Last 3 Encounters:  09/20/15 241 lb 1.6 oz (109.362 kg)  09/20/15 247 lb (112.038 kg)  09/10/15 235 lb (106.595 kg)      Studies/Labs Reviewed:     EKG:  EKG is  ordered today.  The ekg ordered today demonstrates NSR, HR 86, normal axis, LVH, nonspecific ST-T wave changes, QTC 45 ms  Recent Labs: 08/22/2015: B Natriuretic Peptide 465.9*; TSH 5.357* 08/29/2015: ALT 25; Pro B Natriuretic peptide (BNP) 894.0* 09/10/2015: BUN 34*; Creatinine, Ser 1.16; Hemoglobin 10.2*; Platelets 213; Potassium 3.8; Sodium 144   Recent Lipid Panel    Component Value Date/Time   CHOL 187 08/29/2015 1554   TRIG 146.0 08/29/2015 1554   HDL 41.80 08/29/2015 1554   CHOLHDL 4 08/29/2015 1554   VLDL 29.2 08/29/2015 1554   LDLCALC 116* 08/29/2015 1554    Additional studies/ records that were reviewed today include:   Echo 08/22/15 Mod LVH, EF 40-45%, diff HK, mild MR  Myoview 10/16 1. Inferior wall  attenuation on stress and rest imaging probably due to diaphragmatic attenuation given the lack of abnormal wall motion in this vicinity. Scarring of the inferior wall is a differential diagnostic consideration. No inducible ischemia. 2. Normal left ventricular wall motion. 3. Left ventricular ejection fraction 65% 4. Low-risk stress test findings*.   ASSESSMENT:     1. Acute on chronic respiratory failure with hypoxia (HCC)   2. Acute on chronic systolic HF (heart failure) ([place])   3. SVT (  supraventricular tachycardia) ([place])   4. Essential hypertension   5. Chronic obstructive pulmonary disease, unspecified COPD type ([place])     PLAN:     In order of problems listed above:  1. Hypoxic respiratory failure - I suspect that this is all likely related to systolic heart failure. However, he has a reported history of COPD he has had increased sputum production that is yellow in color with occasional hemoptysis.    -  Start IV diuresis  -  Obtain CXR  -  Obtain sputum gram stain, sputum culture, blood cultures x 2  -  After cultures obtained, will cover with Zosyn  2. Acute on Chronic Systolic CHF - As noted, he is volume overloaded.  EF several weeks ago on echo was 40-45%.  Nuc stress study in 10/16 was low risk.  Will need to reassess EF.  If EF is worse, he may need R/L heart cath.    -  Start Lasix 40 mg IV bid  -  BMET, CBC, LFTs, BNP, CXR  -  Follow BMET closely with diuresis  -  Try to reinitiate beta-blocker at some point.   3. PSVT - s/p RFCA.  No apparent recurrence.    4. HTN - Fair control  Continue Lisinopril.  5. COPD - He is not aware of a formal dx.  However, he has been dx with COPD exacerbation in the past.  May need to consider pulmonary evaluation during hospitalization.      Medication Adjustments/Labs and Tests Ordered: Current medicines are reviewed at length with the patient today.  Concerns regarding medicines are outlined above.  Medication changes, Labs and  Tests ordered today are outlined in the Patient Instructions noted below. Patient Instructions  YOU ARE BEING ADMITTED TO [facility] EAST UNIT    Signed, Richardson Dopp, PA-C  09/20/2015 5:43 PM    [facility] [place], [place], [place]  [address] Phone: [phone]; Fax: [phone]

## 2015-09-20 NOTE — Telephone Encounter (Signed)
Pt c/o swelling: STAT is pt has developed SOB within 24 hours  1. How long have you been experiencing swelling? Last 2-3 days  2. Where is the swelling located? Rt foot ankle calf- stated can barely walk  3.  Are you currently taking a "fluid pill"? Yes furosemide   4.  Are you currently SOB? Yes started about 2-3 days ago  5.  Have you traveled recently? No

## 2015-09-20 NOTE — Progress Notes (Signed)
Pharmacy Antibiotic Note  George [name] is a 65 y.o. male admitted on 09/20/2015 with pneumonia/COPD exacerbation.  Pharmacy has been consulted for Zosyn dosing.  Plan: 1. Zosyn 3.375g IV q 8 hrs (extended-interval infusion) 2. F/u cultures, renal function, care plans.  Height: 6\' 4"  (193 cm) Weight: 241 lb 1.6 oz (109.362 kg) IBW/kg (Calculated) : 86.8  Temp (24hrs), Avg:97.7 F (36.5 C), Min:97.7 F (36.5 C), Max:97.7 F (36.5 C)  No results for input(s): WBC, CREATININE, LATICACIDVEN, VANCOTROUGH, VANCOPEAK, VANCORANDOM, GENTTROUGH, GENTPEAK, GENTRANDOM, TOBRATROUGH, TOBRAPEAK, TOBRARND, AMIKACINPEAK, AMIKACINTROU, AMIKACIN in the last 168 hours.  Estimated Creatinine Clearance: 84.9 mL/min (by C-G formula based on Cr of 1.16).    No Known Allergies  Antimicrobials this admission: Zosyn 2/9 >  Dose adjustments this admission: n/a  Microbiology results: ** BCx: ** ** UCx: **  ** Sputum: **  ** MRSA PCR: *  Thank you for allowing pharmacy to be a part of this patient's care.  Uvaldo Rising, BCPS  Clinical Pharmacist Pager [phone]  09/20/2015 5:11 PM

## 2015-09-20 NOTE — Patient Instructions (Signed)
YOU ARE BEING ADMITTED TO [facility] EAST UNIT

## 2015-09-20 NOTE — H&P (Addendum)
Cardiology Admission H&P:    Date:  09/20/2015   ID:  George [name], DOB 04/30/1951, MRN [id number]  PCP:  [name], [place]  Cardiologist:  Dr. Quay Burow   Electrophysiologist:  Dr. Cristopher Peru   Chief Complaint  Patient presents with  . Leg Swelling    History of Present Illness:     George [name] is a 65 y.o. male with a hx of PSVT, COPD, systolic CHF, DCM, HTN, chronic LE extremity edema from venous insufficiency with assoc venous stasis ulcers.  He was admitted in 10/16 with SVT and elevated Troponin levels.  Myoview was low risk at that time. He was admitted 1/17 with AECOPD. He had recurrent SVT on the floor that converted with IV Adenosine.  He was seen by EP and RFCA was recommended.  Of note, echo at that time demonstrated EF 40-45% with diffuse HK. 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 Cristopher Peru. He required IV Lasix prior to DC due to worsening dyspnea.  Metoprolol was DC'd.    He called in today with complaints of worsening edema. He is added on for evaluation.  He notes worsening dyspnea and edema over the past week.  He has gained 9 lbs in the last 2 days on his scales at home.  He is short of breath at rest.  He notes + orthopnea, + PND.  Denies chest pain or syncope.  He notes cough with yellow sputum and occasional blood.  Denies fevers.  Denies melena, hematochezia, vomiting, diarrhea. He no longer smokes.    Past Medical History  Diagnosis Date  . SVT (supraventricular tachycardia) ([place]) 05/22/2015  . GERD (gastroesophageal reflux disease)   . Panic attacks   . Basal cell carcinoma of right side of nose   . CHF (congestive heart failure) ([place])   . Berger's disease     Past Surgical History  Procedure Laterality Date  . Fracture surgery    . Tonsillectomy and adenoidectomy  ~ 1955  . Knee arthroscopy Right ~ 1985  . Femur fracture surgery Right 1970    "had plate & pin put in"  . Femur hardware removal Right 1971    "removed plate; left  pin in  . Basal cell carcinoma excision Right ~ 06/2015    "side of my nose"  . Electrophysiologic study N/A 09/10/2015    Procedure: SVT Ablation;  Surgeon: Evans Lance, MD;  Location: [facility];  Service: Cardiovascular;  Laterality: N/A;    Current Medications: Outpatient Prescriptions Prior to Visit  Medication Sig Dispense Refill  . aspirin EC 81 MG EC tablet Take 1 tablet (81 mg total) by mouth daily.    . furosemide (LASIX) 40 MG tablet Take 1 tablet (40 mg total) by mouth daily. 30 tablet 3  . guaiFENesin (MUCINEX) 600 MG 12 hr tablet Take 2 tablets (1,200 mg total) by mouth 2 (two) times daily. 14 tablet 0  . levalbuterol (XOPENEX HFA) 45 MCG/ACT inhaler Inhale 1-2 puffs into the lungs every 4 (four) hours as needed for wheezing. 1 Inhaler 12  . lisinopril (PRINIVIL,ZESTRIL) 5 MG tablet Take 1 tablet (5 mg total) by mouth daily. 30 tablet 3  . mometasone-formoterol (DULERA) 100-5 MCG/ACT AERO Inhale 2 puffs into the lungs 2 (two) times daily. 8.8 g 6  . zolpidem (AMBIEN) 5 MG tablet Take 1 tablet (5 mg total) by mouth at bedtime as needed for sleep. 15 tablet 1  . predniSONE (DELTASONE) 20  MG tablet Take 2 tablets (40 mg total) by mouth daily with breakfast. (Patient not taking: Reported on 09/20/2015) 14 tablet 0   No facility-administered medications prior to visit.     Allergies:   Review of patient's allergies indicates no known allergies.   Social History   Social History  . Marital Status: Divorced    Spouse Name: N/A  . Number of Children: 0  . Years of Education: 12   Occupational History  . Retired    Social History Main Topics  . Smoking status: Former Smoker -- 2.00 packs/day for 50 years    Types: Cigarettes    Quit date: 01/24/2015  . Smokeless tobacco: Never Used  . Alcohol Use: No  . Drug Use: No  . Sexual Activity: Not Currently   Other Topics Concern  . None   Social History Narrative   Fun: Trade out work, outdoors   Denies  religious beliefs effecting health care.      Family History:  The patient's family history includes Diabetes in his paternal grandmother; Healthy in his maternal grandfather, maternal grandmother, and paternal grandfather; Heart disease in his father and mother; Heart failure in his father.   ROS:   Please see the history of present illness.    Review of Systems  Cardiovascular: Positive for dyspnea on exertion.  All other systems reviewed and are negative.   Physical Exam:    VS:  BP 144/80 mmHg  Pulse 86  Ht 6\' 4"  (1.93 m)  Wt 247 lb (112.038 kg)  BMI 30.08 kg/m2  SpO2 85%   GEN: Well nourished, well developed, in no acute distress HEENT: normal Neck: difficult to assess JVD (appears elevated to jaw) Cardiac: distant S1, S2; RRR; I cannot appreciate murmurs, 1+ bilateral LE edema    Respiratory: bibasilar rales, exp rhonchi throughout GI:  Distended  MS: no deformity or atrophy Skin: warm and dry  Neuro:   no focal deficits  Psych: Alert and oriented x 3, normal affect  Wt Readings from Last 3 Encounters:  09/20/15 247 lb (112.038 kg)  09/10/15 235 lb (106.595 kg)  09/06/15 243 lb 1.9 oz (110.279 kg)      Studies/Labs Reviewed:     EKG:  EKG is  ordered today.  The ekg ordered today demonstrates NSR, HR 86, normal axis, LVH, nonspecific ST-T wave changes, QTC 45 ms  Recent Labs: 08/22/2015: B Natriuretic Peptide 465.9*; TSH 5.357* 08/29/2015: ALT 25; Pro B Natriuretic peptide (BNP) 894.0* 09/10/2015: BUN 34*; Creatinine, Ser 1.16; Hemoglobin 10.2*; Platelets 213; Potassium 3.8; Sodium 144   Recent Lipid Panel    Component Value Date/Time   CHOL 187 08/29/2015 1554   TRIG 146.0 08/29/2015 1554   HDL 41.80 08/29/2015 1554   CHOLHDL 4 08/29/2015 1554   VLDL 29.2 08/29/2015 1554   LDLCALC 116* 08/29/2015 1554    Additional studies/ records that were reviewed today include:   Echo 08/22/15 Mod LVH, EF 40-45%, diff HK, mild MR  Myoview 10/16 1. Inferior wall  attenuation on stress and rest imaging probably due to diaphragmatic attenuation given the lack of abnormal wall motion in this vicinity. Scarring of the inferior wall is a differential diagnostic consideration. No inducible ischemia. 2. Normal left ventricular wall motion. 3. Left ventricular ejection fraction 65% 4. Low-risk stress test findings*.   ASSESSMENT and PLAN:     1. Hypoxic respiratory failure - I suspect that this is all likely related to systolic heart failure. However, he has a reported  history of COPD he has had increased sputum production that is yellow in color with occasional hemoptysis.    -  Start IV diuresis  -  Obtain CXR  -  Obtain sputum gram stain, sputum culture, blood cultures x 2  -  After cultures obtained, will cover with Zosyn  2. Acute on Chronic Systolic CHF - As noted, he is volume overloaded.  EF several weeks ago on echo was 40-45%.  Nuc stress study in 10/16 was low risk.  Will need to reassess EF.  If EF is worse, he may need R/L heart cath.    -  Start Lasix 40 mg IV bid  -  BMET, CBC, LFTs, BNP, Mg, TSH, serial Troponins, CXR  -  Follow BMET closely with diuresis  -  Try to reinitiate beta-blocker at some point.   3. PSVT - s/p RFCA.  No apparent recurrence.    4. HTN - Fair control  Continue Lisinopril.  5. COPD - He is not aware of a formal dx.  However, he has been dx with COPD exacerbation in the past.  May need to consider pulmonary evaluation during hospitalization.      Medication Adjustments/Labs and Tests Ordered: Current medicines are reviewed at length with the patient today.  Concerns regarding medicines are outlined above.  Medication changes, Labs and Tests ordered today are outlined in the Patient Instructions noted below. There are no Patient Instructions on file for this visit.  Signed, Richardson Dopp, PA-C  09/20/2015 3:44 PM    [facility] [address], [place], [place]  [address] Phone: [phone]; Fax: [phone]   ADDENDUM: Patient seen, examined. Available data reviewed. Agree with findings, assessment, and plan as outlined by Richardson Dopp, PA-C. Exam reveals an alert oriented male, mildly uncomfortable. Lungs are coarse bilaterally. CV is RRR without murmur. Ext: 2+ pretib edema. JVP elevated. Awaiting labs and XRay results.   Pt with clinical syndrome consistent with acute systolic  heart failure. Initiate IV diuresis, repeat echo, review labs, follow I/O's. Consider cardiac cath as discussed above, but currently he is grossly volume overloaded and requires diuresis.   Sherren Mocha, M.D. 09/20/2015 6:25 PM

## 2015-09-21 ENCOUNTER — Inpatient Hospital Stay (HOSPITAL_COMMUNITY): Payer: Medicare Other

## 2015-09-21 DIAGNOSIS — I872 Venous insufficiency (chronic) (peripheral): Secondary | ICD-10-CM | POA: Diagnosis present

## 2015-09-21 DIAGNOSIS — N289 Disorder of kidney and ureter, unspecified: Secondary | ICD-10-CM

## 2015-09-21 DIAGNOSIS — I509 Heart failure, unspecified: Secondary | ICD-10-CM

## 2015-09-21 DIAGNOSIS — E876 Hypokalemia: Secondary | ICD-10-CM

## 2015-09-21 DIAGNOSIS — I471 Supraventricular tachycardia: Secondary | ICD-10-CM | POA: Diagnosis not present

## 2015-09-21 LAB — BASIC METABOLIC PANEL
ANION GAP: 10 (ref 5–15)
BUN: 31 mg/dL — ABNORMAL HIGH (ref 6–20)
CALCIUM: 7.6 mg/dL — AB (ref 8.9–10.3)
CO2: 33 mmol/L — ABNORMAL HIGH (ref 22–32)
CREATININE: 1.57 mg/dL — AB (ref 0.61–1.24)
Chloride: 99 mmol/L — ABNORMAL LOW (ref 101–111)
GFR, EST AFRICAN AMERICAN: 51 mL/min — AB (ref >60)
GFR, EST NON AFRICAN AMERICAN: 44 mL/min — AB (ref >60)
Glucose, Bld: 104 mg/dL — ABNORMAL HIGH (ref 65–99)
Potassium: 3.4 mmol/L — ABNORMAL LOW (ref 3.5–5.1)
SODIUM: 142 mmol/L (ref 135–145)

## 2015-09-21 LAB — EXPECTORATED SPUTUM ASSESSMENT W GRAM STAIN, RFLX TO RESP C

## 2015-09-21 LAB — EXPECTORATED SPUTUM ASSESSMENT W REFEX TO RESP CULTURE: SPECIAL REQUESTS: NORMAL

## 2015-09-21 LAB — TROPONIN I: TROPONIN I: 0.15 ng/mL — AB (ref <0.031)

## 2015-09-21 MED ORDER — FUROSEMIDE 10 MG/ML IJ SOLN
80.0000 mg | Freq: Two times a day (BID) | INTRAMUSCULAR | Status: DC
  Administered 2015-09-21 – 2015-09-22 (×2): 80 mg via INTRAVENOUS
  Filled 2015-09-21 (×2): qty 8

## 2015-09-21 NOTE — Progress Notes (Signed)
Echocardiogram 2D Echocardiogram has been performed.  George [name] 09/21/2015, 10:25 AM

## 2015-09-21 NOTE — Progress Notes (Signed)
Heart Failure Navigator Consult Note  Presentation: George [name] is a 65 y.o. male with a hx of PSVT, COPD, systolic CHF, DCM, HTN, chronic LE extremity edema from venous insufficiency with assoc venous stasis ulcers. He was admitted in 10/16 with SVT and elevated Troponin levels. Myoview was low risk at that time. He was admitted 1/17 with AECOPD. He had recurrent SVT on the floor that converted with IV Adenosine. He was seen by EP and RFCA was recommended. Of note, echo at that time demonstrated EF 40-45% with diffuse HK. 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 Cristopher Peru. He required IV Lasix prior to DC due to worsening dyspnea. Metoprolol was DC'd.   He called in today with complaints of worsening edema. He is added on for evaluation. He notes worsening dyspnea and edema over the past week. He has gained 9 lbs in the last 2 days on his scales at home. He is short of breath at rest. He notes + orthopnea, + PND. Denies chest pain or syncope. He notes cough with yellow sputum and occasional blood. Denies fevers. Denies melena, hematochezia, vomiting, diarrhea. He no longer smokes.   Past Medical History  Diagnosis Date  . SVT (supraventricular tachycardia) ([place]) 05/22/2015  . GERD (gastroesophageal reflux disease)   . Panic attacks   . Basal cell carcinoma of right side of nose   . CHF (congestive heart failure) ([place])   . Berger's disease     Social History   Social History  . Marital Status: Divorced    Spouse Name: N/A  . Number of Children: 0  . Years of Education: 12   Occupational History  . Retired    Social History Main Topics  . Smoking status: Former Smoker -- 2.00 packs/day for 50 years    Types: Cigarettes    Quit date: 01/24/2015  . Smokeless tobacco: Never Used  . Alcohol Use: No  . Drug Use: No  . Sexual Activity: Not Currently   Other Topics Concern  . Not on file   Social History Narrative   Fun: Trade out work, outdoors   Denies  religious beliefs effecting health care.     ECHO:Study Conclusions--08/21/15--new pending  - Left ventricle: The cavity size was moderately dilated. Wall thickness was increased in a pattern of moderate LVH. Systolic function was mildly to moderately reduced. The estimated ejection fraction was in the range of 40% to 45%. Diffuse hypokinesis. - Aortic valve: Calcified non coronary cusp. - Mitral valve: There was mild regurgitation. - Atrial septum: No defect or patent foramen ovale was identified.  Transthoracic echocardiography. M-mode, complete 2D, spectral Doppler, and color Doppler. Birthdate: Patient birthdate: 26-Apr-1951. Age: Patient is 65 yr old. Sex: Gender: male. BMI: 28.7 kg/m^2. Blood pressure:   144/94 Patient status: Inpatient. Study date: Study date: 08/22/2015. Study time: 02:37 PM. Location: Bedside.  BNP    Component Value Date/Time   BNP 980.5* 09/20/2015 1738    ProBNP    Component Value Date/Time   PROBNP 894.0* 08/29/2015 1554     Education Assessment and Provision:  Detailed education and instructions provided on heart failure disease management including the following:  Signs and symptoms of Heart Failure When to call the physician Importance of daily weights Low sodium diet Fluid restriction Medication management Anticipated future follow-up appointments  Patient education given on each of the above topics.  Patient acknowledges understanding and acceptance of all instructions.  I know George [name] from previous admission.  He tells  me that she has been weighing and is "here because of gaining 9 pounds in 2 days at home"--along with swelling and SOB.  He admits that "Sodium is in everything" and being careful with eating is difficult at home.  We briefly discussed again a low sodium diet and high sodium foods to avoid.  He says that he has been avoiding most high sodium foods.  He denies any issues with getting or taking  prescribed medications.  He follows with [facility] and did call the office appropriately for worsening symptoms.  Education Materials:  "Living Better With Heart Failure" Booklet, Daily Weight Tracker Tool    High Risk Criteria for Readmission and/or Poor Patient Outcomes:   EF <30%- No last --EF 40-45%  2 or more admissions in 6 months- Yes  Difficult social situation- No?--lives alone  Demonstrates medication noncompliance- No denies    Barriers of Care:  Knowledge and compliance  Discharge Planning:   Plans to return home at this time.  He would definitely benefit from [facility] for ongoing education, knowledge and symptom recognition.

## 2015-09-21 NOTE — Progress Notes (Signed)
Patient Name:  George [name], DOB: 1951/06/21, MRN: [id number] Primary Doctor: [name], [place] Primary Cardiologist:  Kevan Rosebush  Date: 09/21/2015   SUBJECTIVE: The patient feels a little better than since admission. He has not had significant diuresis. His current problems are probably a combination of increased volume and COPD.   Past Medical History  Diagnosis Date  . SVT (supraventricular tachycardia) ([place]) 05/22/2015  . GERD (gastroesophageal reflux disease)   . Panic attacks   . Basal cell carcinoma of right side of nose   . CHF (congestive heart failure) ([place])   . Berger's disease    Filed Vitals:   09/20/15 2122 09/21/15 0506 09/21/15 0948 09/21/15 1000  BP: 130/63 147/79 122/57 122/57  Pulse: 86 76  72  Temp: 98.7 F (37.1 C) 98.5 F (36.9 C)  97.5 F (36.4 C)  TempSrc: Oral Oral  Oral  Resp: 20 20    Height:      Weight:  242 lb 1.6 oz (109.816 kg)    SpO2: 90% 94%      Intake/Output Summary (Last 24 hours) at 09/21/15 1101 Last data filed at 09/21/15 0912  Gross per 24 hour  Intake   1217 ml  Output    775 ml  Net    442 ml   Filed Weights   09/20/15 1654 09/21/15 0506  Weight: 241 lb 1.6 oz (109.362 kg) 242 lb 1.6 oz (109.816 kg)     LABS: Basic Metabolic Panel:  Recent Labs  09/20/15 1738 09/21/15 0600  NA 143 142  K 3.4* 3.4*  CL 98* 99*  CO2 31 33*  GLUCOSE 108* 104*  BUN 31* 31*  CREATININE 1.46* 1.57*  CALCIUM 8.0* 7.6*  MG 2.0  --    Liver Function Tests:  Recent Labs  09/20/15 1738  AST 40  ALT 17  ALKPHOS 56  BILITOT 0.8  PROT 4.4*  ALBUMIN 2.2*   No results for input(s): LIPASE, AMYLASE in the last 72 hours. CBC:  Recent Labs  09/20/15 2000  WBC 7.9  NEUTROABS 6.7  HGB 10.0*  HCT 31.4*  MCV 87.7  PLT 202   Cardiac Enzymes:  Recent Labs  09/20/15 1738 09/20/15 2314 09/21/15 0600  TROPONINI 0.15* 0.15* 0.15*   BNP: Invalid input(s): POCBNP D-Dimer: No results for input(s): DDIMER  in the last 72 hours. Thyroid Function Tests:  Recent Labs  09/20/15 1738  TSH 4.860*    RADIOLOGY: Dg Chest 2 View  09/21/2015  CLINICAL DATA:  CHF.  Cough. EXAM: CHEST  2 VIEW COMPARISON:  09/11/2015.  05/22/2015 .  08/23/2008. FINDINGS: Mediastinum hilar structures normal. Cardiomegaly with pulmonary vascular prominence and bilateral interstitial prominence with basilar pulmonary alveolar infiltrates. Findings consistent with congestive heart failure with pulmonary edema and pleural effusions. Basilar pneumonia cannot be excluded . Underlying chronic interstitial lung disease is present. No pleural effusion or pneumothorax . IMPRESSION: 1. Congestive heart failure with bilateral pulmonary edema and pleural effusion. 2. Underlying chronic interstitial lung disease. Electronically Signed   By: Marcello Moores  Register   On: 09/21/2015 07:49   Dg Chest 2 View  09/11/2015  CLINICAL DATA:  Cardiac catheterization.  Shortness of breath . EXAM: CHEST  2 VIEW COMPARISON:  CT 08/22/2015. Chest x-ray 08/22/2015, 08/20/2015, 05/22/2015. FINDINGS: Cardiomegaly with diffuse bilateral pulmonary interstitial prominence including Kerley B-lines. Component of pulmonary interstitial edema and/or pneumonitis present this fashion. Underlying chronic interstitial lung disease is also most likely present. Small bilateral pleural effusions  cannot be excluded. No pneumothorax . IMPRESSION: Cardiomegaly with mild diffuse pulmonary interstitial prominence including Kerley B-lines. Small pleural effusions. Findings most consistent with congestive heart failure with mild interstitial edema. A component of underlying chronic interstitial lung disease is also most likely present . Electronically Signed   By: Marcello Moores  Register   On: 09/11/2015 07:27    PHYSICAL EXAM   patient is oriented to person time and place. Affect is normal. Lungs reveal expiratory wheezing along with rhonchi and rales. Cardiac exam reveals an S1 and S2. The  abdomen is soft patient has 2+ bilateral edema.   TELEMETRY: I have reviewed telemetry today September 21, 2015. There is normal sinus rhythm.   ASSESSMENT AND PLAN:     Acute respiratory failure (HCC)   COPD exacerbation (HCC)   Acute on chronic systolic CHF (congestive heart failure) ([place])     The patient is volume overloaded. His weight has been increasing at home. His chest x-ray was wet. His chronic peripheral edema is worse than usual. He takes a diuretic at home. I have increased his Lasix dose to 80 IV twice a day. I've ordered strict  I and O. His creatinine is 1.5, up from baseline. Hopefully this will improve with diuresis.  The overall plan will be to continue diuresis.    AVNRT (AV nodal re-entry tachycardia) ([place])     The patient had radiofrequency ablation for his AV nodal reentrant tachycardia on September 10, 2015. He is holding sinus rhythm.    Venous insufficiency of both lower extremities    By history he has chronic edema with venous insufficiency. However his edema currently is worse.     Acute renal insufficiency    Creatinine is up to 1.5. I'm hoping this will return to baseline with appropriate diuresis.     Dola Argyle 09/21/2015 11:01 AM

## 2015-09-21 NOTE — Telephone Encounter (Signed)
See OV note from 09/20/15 Richardson Dopp, PA-C   09/21/2015 2:09 PM

## 2015-09-22 ENCOUNTER — Inpatient Hospital Stay (HOSPITAL_COMMUNITY): Payer: Medicare Other

## 2015-09-22 DIAGNOSIS — I10 Essential (primary) hypertension: Secondary | ICD-10-CM

## 2015-09-22 DIAGNOSIS — R601 Generalized edema: Secondary | ICD-10-CM

## 2015-09-22 DIAGNOSIS — E039 Hypothyroidism, unspecified: Secondary | ICD-10-CM

## 2015-09-22 DIAGNOSIS — I872 Venous insufficiency (chronic) (peripheral): Secondary | ICD-10-CM

## 2015-09-22 DIAGNOSIS — J441 Chronic obstructive pulmonary disease with (acute) exacerbation: Secondary | ICD-10-CM

## 2015-09-22 LAB — URINE MICROSCOPIC-ADD ON

## 2015-09-22 LAB — BASIC METABOLIC PANEL
ANION GAP: 10 (ref 5–15)
BUN: 40 mg/dL — ABNORMAL HIGH (ref 6–20)
CALCIUM: 7.1 mg/dL — AB (ref 8.9–10.3)
CHLORIDE: 101 mmol/L (ref 101–111)
CO2: 32 mmol/L (ref 22–32)
CREATININE: 1.94 mg/dL — AB (ref 0.61–1.24)
GFR calc non Af Amer: 34 mL/min — ABNORMAL LOW (ref >60)
GFR, EST AFRICAN AMERICAN: 40 mL/min — AB (ref >60)
Glucose, Bld: 136 mg/dL — ABNORMAL HIGH (ref 65–99)
Potassium: 3.3 mmol/L — ABNORMAL LOW (ref 3.5–5.1)
SODIUM: 143 mmol/L (ref 135–145)

## 2015-09-22 LAB — URINALYSIS, ROUTINE W REFLEX MICROSCOPIC
BILIRUBIN URINE: NEGATIVE
Glucose, UA: NEGATIVE mg/dL
KETONES UR: NEGATIVE mg/dL
NITRITE: NEGATIVE
Protein, ur: >300 mg/dL — AB
SPECIFIC GRAVITY, URINE: 1.019 (ref 1.005–1.030)
pH: 6 (ref 5.0–8.0)

## 2015-09-22 LAB — T4, FREE: Free T4: 0.99 ng/dL (ref 0.61–1.12)

## 2015-09-22 MED ORDER — HYDROCODONE-ACETAMINOPHEN 5-325 MG PO TABS
1.0000 | ORAL_TABLET | Freq: Four times a day (QID) | ORAL | Status: DC | PRN
  Administered 2015-09-27 – 2015-09-28 (×2): 1 via ORAL
  Administered 2015-09-29 (×2): 2 via ORAL
  Filled 2015-09-22: qty 1
  Filled 2015-09-22 (×2): qty 2
  Filled 2015-09-22: qty 1

## 2015-09-22 NOTE — Progress Notes (Signed)
SUBJECTIVE: Frustrated by slow improvement.  Not diuresis well and renal function is worsening. Edema is chronic.  Breathing issues also appear chronic.  Reports increased abdominal fluid.  Denies chest pain.  Marland Kitchen aspirin EC  81 mg Oral Daily  . enoxaparin (LOVENOX) injection  40 mg Subcutaneous Q24H  . furosemide  80 mg Intravenous BID  . guaiFENesin  1,200 mg Oral BID  . mometasone-formoterol  2 puff Inhalation BID  . piperacillin-tazobactam (ZOSYN)  IV  3.375 g Intravenous 3 times per day  . potassium chloride  10 mEq Oral BID  . sodium chloride flush  3 mL Intravenous Q12H      OBJECTIVE: Physical Exam: Filed Vitals:   09/22/15 0526 09/22/15 0955 09/22/15 1019 09/22/15 1230  BP: 148/85  154/95 141/78  Pulse: 80   57  Temp: 97.7 F (36.5 C)   97.9 F (36.6 C)  TempSrc: Oral   Oral  Resp: 18   18  Height:      Weight: 243 lb 9.6 oz (110.496 kg)     SpO2: 96% 99%  97%    Intake/Output Summary (Last 24 hours) at 09/22/15 1335 Last data filed at 09/22/15 1230  Gross per 24 hour  Intake   1250 ml  Output   1125 ml  Net    125 ml    Telemetry reveals sinus rhythm  GEN- The patient is chronically ill appearing, alert and oriented x 3 today.   Head- normocephalic, atraumatic Eyes-  Sclera clear, conjunctiva pink Ears- hearing intact Oropharynx- clear Neck- supple,  Lungs- coarse BS throughout, normal work of breathing Heart- Regular rate and rhythm, no murmurs, rubs or gallops, PMI not laterally displaced GI- soft, NT, ND, + BS + ascites (mild) Extremities- no clubbing, cyanosis, +2 chronic edema, reduce DP/PT pulses bilaterally Skin- macular rash on both legs,  Chronic wounds on L leg Psych- euthymic mood, full affect Neuro- strength and sensation are intact  LABS: Basic Metabolic Panel:  Recent Labs  09/20/15 1738 09/21/15 0600 09/22/15 0244  NA 143 142 143  K 3.4* 3.4* 3.3*  CL 98* 99* 101  CO2 31 33* 32  GLUCOSE 108* 104* 136*  BUN 31* 31* 40*    CREATININE 1.46* 1.57* 1.94*  CALCIUM 8.0* 7.6* 7.1*  MG 2.0  --   --    Liver Function Tests:  Recent Labs  09/20/15 1738  AST 40  ALT 17  ALKPHOS 56  BILITOT 0.8  PROT 4.4*  ALBUMIN 2.2*   No results for input(s): LIPASE, AMYLASE in the last 72 hours. CBC:  Recent Labs  09/20/15 2000  WBC 7.9  NEUTROABS 6.7  HGB 10.0*  HCT 31.4*  MCV 87.7  PLT 202   Cardiac Enzymes:  Recent Labs  09/20/15 1738 09/20/15 2314 09/21/15 0600  TROPONINI 0.15* 0.15* 0.15*   Thyroid Function Tests:  Recent Labs  09/20/15 1738  TSH 4.860*   Anemia Panel: No results for input(s): VITAMINB12, FOLATE, FERRITIN, TIBC, IRON, RETICCTPCT in the last 72 hours.  RADIOLOGY: Dg Chest 2 View  09/21/2015  CLINICAL DATA:  CHF.  Cough. EXAM: CHEST  2 VIEW COMPARISON:  09/11/2015.  05/22/2015 .  08/23/2008. FINDINGS: Mediastinum hilar structures normal. Cardiomegaly with pulmonary vascular prominence and bilateral interstitial prominence with basilar pulmonary alveolar infiltrates. Findings consistent with congestive heart failure with pulmonary edema and pleural effusions. Basilar pneumonia cannot be excluded . Underlying chronic interstitial lung disease is present. No pleural effusion or pneumothorax . IMPRESSION: 1. Congestive  heart failure with bilateral pulmonary edema and pleural effusion. 2. Underlying chronic interstitial lung disease. Electronically Signed   By: Marcello Moores  Register   On: 09/21/2015 07:49   Dg Chest 2 View  09/11/2015  CLINICAL DATA:  Cardiac catheterization.  Shortness of breath . EXAM: CHEST  2 VIEW COMPARISON:  CT 08/22/2015. Chest x-ray 08/22/2015, 08/20/2015, 05/22/2015. FINDINGS: Cardiomegaly with diffuse bilateral pulmonary interstitial prominence including Kerley B-lines. Component of pulmonary interstitial edema and/or pneumonitis present this fashion. Underlying chronic interstitial lung disease is also most likely present. Small bilateral pleural effusions cannot be  excluded. No pneumothorax . IMPRESSION: Cardiomegaly with mild diffuse pulmonary interstitial prominence including Kerley B-lines. Small pleural effusions. Findings most consistent with congestive heart failure with mild interstitial edema. A component of underlying chronic interstitial lung disease is also most likely present . Electronically Signed   By: [place]   On: 09/11/2015 07:27    ASSESSMENT AND PLAN:  Principal Problem:   Acute respiratory failure ([place]) Active Problems:   Essential hypertension   COPD exacerbation (HCC)   Acute on chronic systolic CHF (congestive heart failure) (HCC)   AVNRT (AV nodal re-entry tachycardia) ([place])   Venous insufficiency of both lower extremities   Acute renal insufficiency   Hypokalemia  1. Chronic edema Echo was remarkably unremarkable I do not feel that his current symptoms are due to CHF. I worry about a more systemic process.  He appears to be developing anasarca refractory to lasix. He has low albumen chronically and could have liver disease as the cause.  Also has worsening renal failure Will consult internal medical for assistance in evaluation for other causes.  I will order renal ultrasound and liver US. Will continue IV lasix for now, though I doubt that we can continue this much longer in the setting of progressive renal failure  2. COPD Coarse BS on exam On antibiotics though my suspicion for pneumonia is low Would avoid steroids given volume overload  3. Acute renal failure Stop ace inhibitor Caution with lasix Renal ultrasound and UA to evaluate for proteinuria  4. HTN Stable Watch off of ace inhibitor  5. avnrt Resolved with ablation  Very complex situation in patient at risk for decompensation.  A high level of decision making is required for this visit  [name], MD 09/22/2015 1:35 PM

## 2015-09-22 NOTE — Consult Note (Addendum)
[facility] Medical Consultation  George [name] [id number] DOB: [birth year] DOA: 09/20/2015 PCP: [name], FNP   Requesting physician: [name], M.D. Date of consultation:  09/22/15 Reason for consultation: Anasarca  Impression/Recommendations  Anasarca. Etiology unclear. Chest x-ray yesterday suggests congestive heart failure with pulmonary edema and pleural effusions. No evidence for heart failure on Echocardiogram however and he isn't responding to diuretics.  -Albumin is low. He had some protein and RBC in urine in October. 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 U/A is pending. If significant protein present will ask Nephrology to see.  -TSH is elevated, this may be contributing. Will check Free T4, and likely start Synthroid -Admitting Team (Cardiology) ordered renal u/s and abdominal u/s (?ascites).   AKI.  -hold diuretics -am BMET   COPD. Admitted with dyspnea / increased sputum and worsening edema. CXR yesterday suggests CHF. Given uncertainty about symptoms George [name] treated for decompensated HF and COPD exac. He was started on Zosyn, sputum cultures,  gram stain ordered (no results). George [name] is wheezing on exam but not in any distress.  Sats okay. -continue Nebulizers -D/C Zosyn -May need trial of steroids but this could increase fluid retention  LLE ulcerations (chronic). Normal ABIs in past. Evaluated months ago by Vascular and diagnosed with venous stasis related ulcerations. George [name] complains of cold /numb feet. Remote possibility but George [name] told he had Berger's disease last year. Might this have been Buerger's disease which is an inflammatory vasculopathy causing blockages in blood vessels of feet / hands?    We will followup again tomorrow. Please contact me if I can be of assistance in the meanwhile. Thank you for this consultation.  Chief Complaint:  Swelling  HPI: George [name] is a 65 year old male who was admitted in mid January  with dyspnea felt to be secondary to undiagnosed congestive heart failure versus COPD the exacerbation.  Echocardiogram at the time revealed an ejection fraction of 40-45%, diffuse hypokinesis. Given lasix.  During that same admission George [name] was found to have supraventricular tachycardia, converted with adenosine. Cardiology recommended outpatient EP study and metoprolol. Metoprolol eventually discontinued out of concern for bronchospasm. Following discharge George [name] recurrent episodes of SVT, in late January he underwent ablation.  Over the last few weeks George [name] has been unable to tolerate minimal exertion without becoming short of breath. He reports wheezing and a dry cough for 3 weeks. He has gained 9 pound weight gain over the last several days. Evaluated at his cardiologist's office a few days ago for worsening edema and dyspnea. He was directly admitted to the hospital from cardiologist's office  for presumed acute on chronic systolic heart failure. He has been on diuretics without any improvement in edema nor significant urine output. His Cr has risen with diuretics.    George [name] has a history of smoking but denies any history of pulmonary consequences up his hospitalization last month. George [name] states he had never had any history of heart failure until recently. He gives a history of Berger's disease diagnosed a year ago. He denies family history of kidney problems.   Review of Systems:  Constitutional: Denies fever, chills, diaphoresis, appetite change and fatigue.  HEENT: Denies photophobia, eye pain, redness, hearing loss, ear pain, congestion, sore throat, rhinorrhea, sneezing, mouth sores, trouble swallowing, neck pain, neck stiffness and tinnitus.  Respiratory: Positive for SOB with exertion, positive for wheezing.  Cardiovascular: Denies chest pain, palpitations positive for bilateral leg swelling  Gastrointestinal: Denies nausea, vomiting, abdominal pain, diarrhea, constipation, blood in  stool and  abdominal distention.  Genitourinary: Denies dysuria, urgency, frequency, hematuria, flank pain and difficulty urinating.  Musculoskeletal: Denies myalgias, back pain, joint swelling, arthralgias and gait problem.  Skin: Positive for LLE ulcers, .  Neurological: Denies dizziness, seizures, syncope, weakness, light-headedness, numbness and headaches.  Hematological: Denies adenopathy. Easy bruising, personal or family bleeding history  Psychiatric/Behavioral: Denies suicidal ideation, mood changes, confusion, nervousness, sleep disturbance and agitation    Past Medical History  Diagnosis Date  . SVT (supraventricular tachycardia) ([place]) 05/22/2015  . GERD (gastroesophageal reflux disease)   . Panic attacks   . Basal cell carcinoma of right side of nose   . CHF (congestive heart failure) ([place])   . Berger's disease    Past Surgical History  Procedure Laterality Date  . Fracture surgery    . Tonsillectomy and adenoidectomy  ~ 1955  . Knee arthroscopy Right ~ 1985  . Femur fracture surgery Right 1970    "had plate & pin put in"  . Femur hardware removal Right 1971    "removed plate; left pin in  . Basal cell carcinoma excision Right ~ 06/2015    "side of my nose"  . Electrophysiologic study N/A 09/10/2015    Procedure: SVT Ablation;  Surgeon: Evans Lance, MD;  Location: [facility];  Service: Cardiovascular;  Laterality: N/A;   Social History:  reports that he quit smoking about 7 months ago. His smoking use included Cigarettes. He has a 100 pack-year smoking history. He has never used smokeless tobacco. He reports that he does not drink alcohol or use illicit drugs.  No Known Allergies Family History  Problem Relation Age of Onset  . Heart disease Mother     Before age 27  . Heart failure Father   . Heart disease Father     After age 50  . Healthy Maternal Grandmother   . Healthy Maternal Grandfather   . Diabetes Paternal Grandmother   . Healthy Paternal  Grandfather     Prior to Admission medications   Medication Sig Start Date End Date Taking? Authorizing Provider  aspirin EC 81 MG EC tablet Take 1 tablet (81 mg total) by mouth daily. 05/23/15  Yes Erma Heritage, PA  furosemide (LASIX) 40 MG tablet Take 1 tablet (40 mg total) by mouth daily. 09/11/15  Yes Renee Dyane Dustman, PA-C  guaiFENesin (MUCINEX) 600 MG 12 hr tablet Take 2 tablets (1,200 mg total) by mouth 2 (two) times daily. 08/24/15  Yes Maryann Mikhail, DO  levalbuterol (XOPENEX HFA) 45 MCG/ACT inhaler Inhale 1-2 puffs into the lungs every 4 (four) hours as needed for wheezing. 08/29/15  Yes Golden Circle, FNP  lisinopril (PRINIVIL,ZESTRIL) 5 MG tablet Take 1 tablet (5 mg total) by mouth daily. 09/11/15  Yes Renee Dyane Dustman, PA-C  zolpidem (AMBIEN) 5 MG tablet Take 1 tablet (5 mg total) by mouth at bedtime as needed for sleep. 09/06/15  Yes Hoyt Koch, MD  mometasone-formoterol (DULERA) 100-5 MCG/ACT AERO Inhale 2 puffs into the lungs 2 (two) times daily. George [name] not taking: Reported on 09/21/2015 09/06/15   Hoyt Koch, MD   Physical Exam: Blood pressure 141/78, pulse 57, temperature 97.9 F (36.6 C), temperature source Oral, resp. rate 18, height 6\' 4"  (1.93 m), weight 110.496 kg (243 lb 9.6 oz), SpO2 97 %. Filed Vitals:   09/22/15 1019 09/22/15 1230  BP: 154/95 141/78  Pulse:  57  Temp:  97.9 F (36.6 C)  Resp:  18    Constitutional: Vital  signs reviewed. George [name] is a well-developed and well-nourished in no acute distress and cooperative with exam. Alert and oriented x3.  Head: Normocephalic and atraumatic  Ear: normal hearing  Mouth: no erythema or exudates, MMM. Poor dentition Eyes: PER, conjunctiva pink.  Neck: Supple, Trachea midline normal ROM, No JVD, mass, Cardiovascular: RRR, S1 normal, S2 normal, no MRG. Anasarca present. Pulmonary/Chest: Bilateral upper and lower lobe exp wheezing.  Abdominal: Soft. Non-tender, non-distended, bowel sounds  are normal, no masses, organomegaly, or guarding present.  Musculoskeletal: No joint deformities, erythema, or stiffness, ROM full and no nontender Ext: Pitting edema of BLE edema. Multiple scabbed over ulceration on LLE. Dime size ulcer top of left foot.  Hematology: no cervical adenopathy.  Neurological: A&O x3, Strenght is normal and symmetric bilaterally, cranial nerve II-XII are grossly intact, no focal motor deficit, sensory intact to light touch bilaterally.  Skin: Warm, dry and intact. No rash, cyanosis, or clubbing.  Psychiatric: Normal mood and affect. speech and behavior is normal. Judgment and thought content normal. Cognition and memory are normal.   Labs on Admission:  Basic Metabolic Panel:  Recent Labs Lab 09/20/15 1738 09/21/15 0600 09/22/15 0244  NA 143 142 143  K 3.4* 3.4* 3.3*  CL 98* 99* 101  CO2 31 33* 32  GLUCOSE 108* 104* 136*  BUN 31* 31* 40*  CREATININE 1.46* 1.57* 1.94*  CALCIUM 8.0* 7.6* 7.1*  MG 2.0  --   --    Liver Function Tests:  Recent Labs Lab 09/20/15 1738  AST 40  ALT 17  ALKPHOS 56  BILITOT 0.8  PROT 4.4*  ALBUMIN 2.2*    CBC:  Recent Labs Lab 09/20/15 2000  WBC 7.9  NEUTROABS 6.7  HGB 10.0*  HCT 31.4*  MCV 87.7  PLT 202   Cardiac Enzymes:  Recent Labs Lab 09/20/15 1738 09/20/15 2314 09/21/15 0600  TROPONINI 0.15* 0.15* 0.15*   Radiological Exams on Admission: Dg Chest 2 View  09/21/2015  CLINICAL DATA:  CHF.  Cough. EXAM: CHEST  2 VIEW COMPARISON:  09/11/2015.  05/22/2015 .  08/23/2008. FINDINGS: Mediastinum hilar structures normal. Cardiomegaly with pulmonary vascular prominence and bilateral interstitial prominence with basilar pulmonary alveolar infiltrates. Findings consistent with congestive heart failure with pulmonary edema and pleural effusions. Basilar pneumonia cannot be excluded . Underlying chronic interstitial lung disease is present. No pleural effusion or pneumothorax . IMPRESSION: 1. Congestive heart  failure with bilateral pulmonary edema and pleural effusion. 2. Underlying chronic interstitial lung disease. Electronically Signed   By: Peak   On: 09/21/2015 07:49   Time spent: 69 minutes  Tye Savoy [facility] Pager [phone]  If 7PM-7AM, please contact night-coverage [email or web address] Password [email or web address] 09/22/2015, 2:52 PM

## 2015-09-23 ENCOUNTER — Inpatient Hospital Stay (HOSPITAL_COMMUNITY): Payer: Medicare Other

## 2015-09-23 DIAGNOSIS — E876 Hypokalemia: Secondary | ICD-10-CM

## 2015-09-23 DIAGNOSIS — N179 Acute kidney failure, unspecified: Secondary | ICD-10-CM | POA: Insufficient documentation

## 2015-09-23 DIAGNOSIS — N171 Acute kidney failure with acute cortical necrosis: Secondary | ICD-10-CM

## 2015-09-23 LAB — BASIC METABOLIC PANEL
Anion gap: 9 (ref 5–15)
BUN: 38 mg/dL — ABNORMAL HIGH (ref 6–20)
CALCIUM: 7.2 mg/dL — AB (ref 8.9–10.3)
CO2: 31 mmol/L (ref 22–32)
CREATININE: 1.82 mg/dL — AB (ref 0.61–1.24)
Chloride: 102 mmol/L (ref 101–111)
GFR calc non Af Amer: 37 mL/min — ABNORMAL LOW (ref >60)
GFR, EST AFRICAN AMERICAN: 43 mL/min — AB (ref >60)
GLUCOSE: 104 mg/dL — AB (ref 65–99)
Potassium: 3.1 mmol/L — ABNORMAL LOW (ref 3.5–5.1)
Sodium: 142 mmol/L (ref 135–145)

## 2015-09-23 LAB — CULTURE, RESPIRATORY W GRAM STAIN: Culture: NORMAL

## 2015-09-23 LAB — CULTURE, RESPIRATORY

## 2015-09-23 MED ORDER — POTASSIUM CHLORIDE CRYS ER 20 MEQ PO TBCR
40.0000 meq | EXTENDED_RELEASE_TABLET | Freq: Once | ORAL | Status: AC
  Administered 2015-09-23: 40 meq via ORAL
  Filled 2015-09-23: qty 2

## 2015-09-23 MED ORDER — FUROSEMIDE 10 MG/ML IJ SOLN
120.0000 mg | Freq: Three times a day (TID) | INTRAVENOUS | Status: DC
  Administered 2015-09-23 – 2015-09-27 (×12): 120 mg via INTRAVENOUS
  Filled 2015-09-23 (×16): qty 12

## 2015-09-23 NOTE — Progress Notes (Signed)
[facility] PROGRESS NOTE  George [name] [id number] DOB: 1951-08-03 DOA: 09/20/2015 PCP: [name], FNP  Assessment/Plan: 1. Anasarca/Proteinuria/Hypoalbuminemia -likely has nephrotic syndrome or GN given proteinuria/hematuria -ECHO without CHF per Cards, Liver US without signs of Cirrhosis -Suspect poor response to Lasix is due to severity of hypoalbuminemia -Will likely need much higher doses of diuretics, renal Dr. Jonnie Finner consulted -Check urine protein/creatinine ratio -I see note with documentation of /Bergers disease/IgA nephropathy however patient does not recall such a diagnosis  2. Hypokalemia -replace  3. HTN -worsened by hypervolemia  4. COPD -stable, nebs PRN  5. Leg ulcers  6. TSH slightly up but T4 WNL, consistent with Sick Euthyroid syndrome -doesn't need Synthroid, repeat in 3 months  7. ? HCAP -doubt this diagnosis, STop Abx  DVt proph: lovenox  Code Status: Full Code Family Communication: none at bedside Disposition Plan: Home when improved   Consultants:  HPI/Subjective: Feels a little better today  Objective: Filed Vitals:   09/23/15 0621 09/23/15 1200  BP: 144/75 161/110  Pulse: 79 99  Temp: 98 F (36.7 C) 98 F (36.7 C)  Resp: 18 18    Intake/Output Summary (Last 24 hours) at 09/23/15 1314 Last data filed at 09/23/15 0900  Gross per 24 hour  Intake   1090 ml  Output   1000 ml  Net     90 ml   Filed Weights   09/21/15 0506 09/22/15 0526 09/23/15 0621  Weight: 109.816 kg (242 lb 1.6 oz) 110.496 kg (243 lb 9.6 oz) 110.451 kg (243 lb 8 oz)    Exam:   General:  AAOx3  Cardiovascular: S1S2/RRR  Respiratory: diminished BS at bases  Abdomen: soft, Nt, BS present  Musculoskeletal: 3plus edema, chronic ulcers   Data Reviewed: Basic Metabolic Panel:  Recent Labs Lab 09/20/15 1738 09/21/15 0600 09/22/15 0244 09/23/15 0222  NA 143 142 143 142  K 3.4* 3.4* 3.3* 3.1*  CL 98* 99* 101 102  CO2 31 33* 32 31   GLUCOSE 108* 104* 136* 104*  BUN 31* 31* 40* 38*  CREATININE 1.46* 1.57* 1.94* 1.82*  CALCIUM 8.0* 7.6* 7.1* 7.2*  MG 2.0  --   --   --    Liver Function Tests:  Recent Labs Lab 09/20/15 1738  AST 40  ALT 17  ALKPHOS 56  BILITOT 0.8  PROT 4.4*  ALBUMIN 2.2*   No results for input(s): LIPASE, AMYLASE in the last 168 hours. No results for input(s): AMMONIA in the last 168 hours. CBC:  Recent Labs Lab 09/20/15 2000  WBC 7.9  NEUTROABS 6.7  HGB 10.0*  HCT 31.4*  MCV 87.7  PLT 202   Cardiac Enzymes:  Recent Labs Lab 09/20/15 1738 09/20/15 2314 09/21/15 0600  TROPONINI 0.15* 0.15* 0.15*   BNP (last 3 results)  Recent Labs  08/22/15 0101 09/20/15 1738  BNP 465.9* 980.5*    ProBNP (last 3 results)  Recent Labs  08/29/15 1554  PROBNP 894.0*    CBG: No results for input(s): GLUCAP in the last 168 hours.  Recent Results (from the past 240 hour(s))  Culture, respiratory (NON-Expectorated)     Status: None (Preliminary result)   Collection Time: 09/20/15  5:10 AM  Result Value Ref Range Status   Specimen Description EXPECTORATED SPUTUM  Final   Special Requests NONE  Final   Gram Stain   Final    ABUNDANT WBC PRESENT,BOTH PMN AND MONONUCLEAR FEW SQUAMOUS EPITHELIAL CELLS PRESENT MODERATE GRAM POSITIVE COCCI IN PAIRS FEW GRAM  POSITIVE RODS Performed at [facility]    Culture   Final    NORMAL OROPHARYNGEAL FLORA Performed at [facility]    Report Status PENDING  Incomplete  Culture, expectorated sputum-assessment     Status: None   Collection Time: 09/20/15  9:31 AM  Result Value Ref Range Status   Specimen Description EXPECTORATED SPUTUM  Final   Special Requests Normal  Final   Sputum evaluation   Final    THIS SPECIMEN IS ACCEPTABLE. RESPIRATORY CULTURE REPORT TO FOLLOW.   Report Status 09/21/2015 FINAL  Final  Culture, blood (Routine X 2) w Reflex to ID Panel     Status: None (Preliminary result)   Collection Time:  09/20/15  5:42 PM  Result Value Ref Range Status   Specimen Description BLOOD RIGHT ANTECUBITAL  Final   Special Requests BOTTLES DRAWN AEROBIC AND ANAEROBIC 5CC  Final   Culture NO GROWTH 3 DAYS  Final   Report Status PENDING  Incomplete  Culture, blood (Routine X 2) w Reflex to ID Panel     Status: None (Preliminary result)   Collection Time: 09/20/15  5:46 PM  Result Value Ref Range Status   Specimen Description BLOOD LEFT ANTECUBITAL  Final   Special Requests BOTTLES DRAWN AEROBIC AND ANAEROBIC 5CC  Final   Culture NO GROWTH 3 DAYS  Final   Report Status PENDING  Incomplete     Studies: US Abdomen Complete  09/23/2015  CLINICAL DATA:  65 year old male with chronic edema, abdominal discomfort and distention. Acute renal failure. EXAM: ABDOMEN ULTRASOUND COMPLETE COMPARISON:  None. FINDINGS: Gallbladder: A few mobile gallstones are identified, the largest measuring 4 mm. There is no evidence of gallbladder wall thickening, sonographic Murphy's sign or pericholecystic fluid. Common bile duct: Diameter: 3.8 mm. There is no evidence of intrahepatic or extrahepatic biliary dilatation. Is Liver: No focal lesion identified. Within normal limits in parenchymal echogenicity. IVC: No abnormality visualized. Pancreas: Visualized portion unremarkable. Spleen: Splenomegaly is noted with splenic volume of 570 cc. No focal splenic abnormalities noted. Right Kidney: Length: 12.6 cm. Echogenicity within normal limits. No mass or hydronephrosis visualized. Left Kidney: Length: 12.9 cm. Echogenicity within normal limits. No mass or hydronephrosis visualized. Abdominal aorta: An infrarenal suprailiac distal abdominal aortic aneurysm is identified measuring 3 cm. Other findings: There is no evidence of ascites. Small to moderate bilateral pleural effusions are noted. IMPRESSION: Unremarkable liver. No gross morphologic changes of cirrhosis on this examination. Splenomegaly. Small to moderate bilateral pleural  effusions. No evidence of ascites. Cholelithiasis without evidence of acute cholecystitis. 3 cm distal abdominal aortic aneurysm. Electronically Signed   By: Margarette Canada M.D.   On: 09/23/2015 09:03    Scheduled Meds: . aspirin EC  81 mg Oral Daily  . enoxaparin (LOVENOX) injection  40 mg Subcutaneous Q24H  . guaiFENesin  1,200 mg Oral BID  . mometasone-formoterol  2 puff Inhalation BID  . piperacillin-tazobactam (ZOSYN)  IV  3.375 g Intravenous 3 times per day  . sodium chloride flush  3 mL Intravenous Q12H   Continuous Infusions:  Antibiotics Given (last 72 hours)    Date/Time Action Medication Dose Rate   09/20/15 2235 Given   piperacillin-tazobactam (ZOSYN) IVPB 3.375 g 3.375 g 12.5 mL/hr   09/21/15 0542 Given   piperacillin-tazobactam (ZOSYN) IVPB 3.375 g 3.375 g 12.5 mL/hr   09/21/15 1412 Given   piperacillin-tazobactam (ZOSYN) IVPB 3.375 g 3.375 g 12.5 mL/hr   09/21/15 2150 Given   piperacillin-tazobactam (ZOSYN) IVPB 3.375 g  3.375 g 12.5 mL/hr   09/22/15 0522 Given   piperacillin-tazobactam (ZOSYN) IVPB 3.375 g 3.375 g 12.5 mL/hr   09/22/15 1516 Given   piperacillin-tazobactam (ZOSYN) IVPB 3.375 g 3.375 g 12.5 mL/hr   09/22/15 2121 Given   piperacillin-tazobactam (ZOSYN) IVPB 3.375 g 3.375 g 12.5 mL/hr   09/23/15 0501 Given   piperacillin-tazobactam (ZOSYN) IVPB 3.375 g 3.375 g 12.5 mL/hr   09/23/15 1400 Given   piperacillin-tazobactam (ZOSYN) IVPB 3.375 g 3.375 g 12.5 mL/hr      Principal Problem:   Acute respiratory failure (HCC) Active Problems:   Essential hypertension   COPD exacerbation (HCC)   Acute on chronic systolic CHF (congestive heart failure) (HCC)   AVNRT (AV nodal re-entry tachycardia) ([place])   Venous insufficiency of both lower extremities   Acute renal insufficiency   Hypokalemia   Hypothyroidism    Time spent: [facility] Pager [phone]. If 7PM-7AM, please contact night-coverage at [email or web address], password  [facility] 09/23/2015, 1:14 PM  LOS: 3 days

## 2015-09-23 NOTE — Progress Notes (Signed)
SUBJECTIVE: Feels "a little better" today.  Says "I finely feel like people are listening to me."  -->Seems to have appreciated IM input yesterday. Edema is chronic.  Breathing issues also appear chronic.  Reports increased abdominal fluid.  Denies chest pain.  Marland Kitchen aspirin EC  81 mg Oral Daily  . enoxaparin (LOVENOX) injection  40 mg Subcutaneous Q24H  . guaiFENesin  1,200 mg Oral BID  . mometasone-formoterol  2 puff Inhalation BID  . piperacillin-tazobactam (ZOSYN)  IV  3.375 g Intravenous 3 times per day  . potassium chloride  40 mEq Oral Once  . sodium chloride flush  3 mL Intravenous Q12H      OBJECTIVE: Physical Exam: Filed Vitals:   09/22/15 1230 09/22/15 2050 09/22/15 2109 09/23/15 0621  BP: 141/78 154/78  144/75  Pulse: 57 86  79  Temp: 97.9 F (36.6 C) 98.1 F (36.7 C)  98 F (36.7 C)  TempSrc: Oral Oral  Oral  Resp: 18 16  18   Height:      Weight:    243 lb 8 oz (110.451 kg)  SpO2: 97% 97% 97% 94%    Intake/Output Summary (Last 24 hours) at 09/23/15 0830 Last data filed at 09/23/15 [id number]  Gross per 24 hour  Intake   1210 ml  Output   1300 ml  Net    -90 ml    Telemetry reveals sinus rhythm  GEN- The patient is chronically ill appearing, alert and oriented x 3 today.   Head- normocephalic, atraumatic Eyes-  Sclera clear, conjunctiva pink Ears- hearing intact Oropharynx- clear Neck- supple,  Lungs- coarse BS throughout, normal work of breathing Heart- Regular rate and rhythm, no murmurs, rubs or gallops, PMI not laterally displaced GI- soft, NT, ND, + BS + ascites (mild) Extremities- no clubbing, cyanosis, +2 chronic edema, reduce DP/PT pulses bilaterally Skin- macular rash on both legs,  Chronic wounds on L>R leg Psych- euthymic mood, full affect Neuro- strength and sensation are intact  LABS: Basic Metabolic Panel:  Recent Labs  09/20/15 1738  09/22/15 0244 09/23/15 0222  NA 143  < > 143 142  K 3.4*  < > 3.3* 3.1*  CL 98*  < > 101 102  CO2  31  < > 32 31  GLUCOSE 108*  < > 136* 104*  BUN 31*  < > 40* 38*  CREATININE 1.46*  < > 1.94* 1.82*  CALCIUM 8.0*  < > 7.1* 7.2*  MG 2.0  --   --   --   < > = values in this interval not displayed. Liver Function Tests:  Recent Labs  09/20/15 1738  AST 40  ALT 17  ALKPHOS 56  BILITOT 0.8  PROT 4.4*  ALBUMIN 2.2*   No results for input(s): LIPASE, AMYLASE in the last 72 hours. CBC:  Recent Labs  09/20/15 2000  WBC 7.9  NEUTROABS 6.7  HGB 10.0*  HCT 31.4*  MCV 87.7  PLT 202   Cardiac Enzymes:  Recent Labs  09/20/15 1738 09/20/15 2314 09/21/15 0600  TROPONINI 0.15* 0.15* 0.15*   Thyroid Function Tests:  Recent Labs  09/20/15 1738  TSH 4.860*   Anemia Panel: No results for input(s): VITAMINB12, FOLATE, FERRITIN, TIBC, IRON, RETICCTPCT in the last 72 hours.  RADIOLOGY: Dg Chest 2 View  09/21/2015  CLINICAL DATA:  CHF.  Cough. EXAM: CHEST  2 VIEW COMPARISON:  09/11/2015.  05/22/2015 .  08/23/2008. FINDINGS: Mediastinum hilar structures normal. Cardiomegaly with pulmonary vascular prominence and bilateral  interstitial prominence with basilar pulmonary alveolar infiltrates. Findings consistent with congestive heart failure with pulmonary edema and pleural effusions. Basilar pneumonia cannot be excluded . Underlying chronic interstitial lung disease is present. No pleural effusion or pneumothorax . IMPRESSION: 1. Congestive heart failure with bilateral pulmonary edema and pleural effusion. 2. Underlying chronic interstitial lung disease. Electronically Signed   By: Marcello Moores  Register   On: 09/21/2015 07:49   Dg Chest 2 View  09/11/2015  CLINICAL DATA:  Cardiac catheterization.  Shortness of breath . EXAM: CHEST  2 VIEW COMPARISON:  CT 08/22/2015. Chest x-ray 08/22/2015, 08/20/2015, 05/22/2015. FINDINGS: Cardiomegaly with diffuse bilateral pulmonary interstitial prominence including Kerley B-lines. Component of pulmonary interstitial edema and/or pneumonitis present this  fashion. Underlying chronic interstitial lung disease is also most likely present. Small bilateral pleural effusions cannot be excluded. No pneumothorax . IMPRESSION: Cardiomegaly with mild diffuse pulmonary interstitial prominence including Kerley B-lines. Small pleural effusions. Findings most consistent with congestive heart failure with mild interstitial edema. A component of underlying chronic interstitial lung disease is also most likely present . Electronically Signed   By: [place]   On: 09/11/2015 07:27    ASSESSMENT AND PLAN:  Principal Problem:   Acute respiratory failure ([place]) Active Problems:   Essential hypertension   COPD exacerbation (HCC)   Acute on chronic systolic CHF (congestive heart failure) (HCC)   AVNRT (AV nodal re-entry tachycardia) ([place])   Venous insufficiency of both lower extremities   Acute renal insufficiency   Hypokalemia   Hypothyroidism  1. Chronic edema Echo was remarkably unremarkable (normal EF, normal diastolic parameters) I do not feel that his current symptoms are due to CHF.  Appreciate medicine help. I worry about a more systemic process.  He appears to be developing anasarca refractory to lasix. He has low albumen chronically.  Significant proteinuria and RBCs on UA.  Renal and liver USs pending.   Given clinical picture and proteinuria in setting of worsening renal function, I have asked nephrology to see today. Internal medical also following to evaluate for other causes, such as thyroid dysfunction and liver disease.  2. COPD Coarse BS on exam On antibiotics though my suspicion for pneumonia is low  3. Acute renal failure Currently off ace inhibitor Per IM, diuresis is on hold Renal ultrasound to evaluate proteinuria Nephrology to see  4. HTN Stable Watch off of ace inhibitor   Very complex situation in patient at risk for decompensation.  A high level of decision making is required for this visit.  [name],  MD 09/23/2015 8:30 AM

## 2015-09-23 NOTE — Consult Note (Signed)
[facility] Consult Note     Date: 09/23/2015                  Patient Name:  George [name]  MRN: [id number]  DOB: [birth year]  Age / Sex: 65 y.o., male         PCP: [name], FNP                 Service Requesting Consult: Cardiology ([place])                 Reason for Consult: Edema, proteinuria            Chief Complaint: SOB HPI:  George [name] is a 65 y.o. male with a hx of PSVT, COPD, systolic CHF, DCM, HTN, chronic LE extremity edema from venous insufficiency with assoc venous stasis ulcers.    He called in on 2/9 to cardiology office with complaints of worsening edema and edema over the past week. He was admitted for management of CHF and edema. He has gained 9 lbs in the last 2 days on his scales at home. He is short of breath at rest. He notes + orthopnea, + PND. Denies chest pain or syncope. He notes cough with yellow sputum and occasional blood. Denies fevers. Denies melena, hematochezia, vomiting, diarrhea. He no longer smokes - quit 9 months ago.   Patient reports no history of kidney problems, denies dysuria, NSAID use (except once per week), decreased UOP or PO intake.  He reports that since starting Lasix, his urine has been darker in color.  He has noticed worsening SOB and LE edema.  Past Medical History  Diagnosis Date  . SVT (supraventricular tachycardia) ([place]) 05/22/2015  . GERD (gastroesophageal reflux disease)   . Panic attacks   . Basal cell carcinoma of right side of nose   . CHF (congestive heart failure) ([place])   . Berger's disease     Past Surgical History  Procedure Laterality Date  . Fracture surgery    . Tonsillectomy and adenoidectomy  ~ 1955  . Knee arthroscopy Right ~ 1985  . Femur fracture surgery Right 1970    "had plate & pin put in"  . Femur hardware removal Right 1971    "removed plate; left pin in  . Basal cell carcinoma excision Right ~ 06/2015    "side of my nose"  . Electrophysiologic study N/A  09/10/2015    Procedure: SVT Ablation;  Surgeon: Evans Lance, MD;  Location: [facility];  Service: Cardiovascular;  Laterality: N/A;    Family History  Problem Relation Age of Onset  . Heart disease Mother     Before age 38  . Heart failure Father   . Heart disease Father     After age 56  . Healthy Maternal Grandmother   . Healthy Maternal Grandfather   . Diabetes Paternal Grandmother   . Healthy Paternal Grandfather    Social History:  reports that he quit smoking about 7 months ago. His smoking use included Cigarettes. He has a 100 pack-year smoking history. He has never used smokeless tobacco. He reports that he does not drink alcohol or use illicit drugs.  Allergies: No Known Allergies  Medications Prior to Admission  Medication Sig Dispense Refill  . aspirin EC 81 MG EC tablet Take 1 tablet (81 mg total) by mouth daily.    . furosemide (LASIX) 40 MG tablet Take 1 tablet (40 mg total) by mouth daily. [place]  tablet 3  . guaiFENesin (MUCINEX) 600 MG 12 hr tablet Take 2 tablets (1,200 mg total) by mouth 2 (two) times daily. 14 tablet 0  . levalbuterol (XOPENEX HFA) 45 MCG/ACT inhaler Inhale 1-2 puffs into the lungs every 4 (four) hours as needed for wheezing. 1 Inhaler 12  . lisinopril (PRINIVIL,ZESTRIL) 5 MG tablet Take 1 tablet (5 mg total) by mouth daily. 30 tablet 3  . zolpidem (AMBIEN) 5 MG tablet Take 1 tablet (5 mg total) by mouth at bedtime as needed for sleep. 15 tablet 1  . mometasone-formoterol (DULERA) 100-5 MCG/ACT AERO Inhale 2 puffs into the lungs 2 (two) times daily. (Patient not taking: Reported on 09/21/2015) 8.8 g 6    Results for orders placed or performed during the hospital encounter of 09/20/15 (from the past 48 hour(s))  Basic metabolic panel     Status: Abnormal   Collection Time: 09/22/15  2:44 AM  Result Value Ref Range   Sodium 143 135 - 145 mmol/L   Potassium 3.3 (L) 3.5 - 5.1 mmol/L   Chloride 101 101 - 111 mmol/L   CO2 32 22 - 32 mmol/L    Glucose, Bld 136 (H) 65 - 99 mg/dL   BUN 40 (H) 6 - 20 mg/dL   Creatinine, Ser 1.94 (H) 0.61 - 1.24 mg/dL   Calcium 7.1 (L) 8.9 - 10.3 mg/dL   GFR calc non Af Amer 34 (L) >60 mL/min   GFR calc Af Amer 40 (L) >60 mL/min    Comment: (NOTE) The eGFR has been calculated using the CKD EPI equation. This calculation has not been validated in all clinical situations. eGFR's persistently <60 mL/min signify possible Chronic Kidney Disease.    Anion gap 10 5 - 15  T4, free     Status: None   Collection Time: 09/22/15  3:48 PM  Result Value Ref Range   Free T4 0.99 0.61 - 1.12 ng/dL  Urinalysis, Routine w reflex microscopic (not at [facility])     Status: Abnormal   Collection Time: 09/22/15  6:08 PM  Result Value Ref Range   Color, Urine YELLOW YELLOW   APPearance CLOUDY (A) CLEAR   Specific Gravity, Urine 1.019 1.005 - 1.030   pH 6.0 5.0 - 8.0   Glucose, UA NEGATIVE NEGATIVE mg/dL   Hgb urine dipstick LARGE (A) NEGATIVE   Bilirubin Urine NEGATIVE NEGATIVE   Ketones, ur NEGATIVE NEGATIVE mg/dL   Protein, ur >300 (A) NEGATIVE mg/dL   Nitrite NEGATIVE NEGATIVE   Leukocytes, UA SMALL (A) NEGATIVE  Urine microscopic-add on     Status: Abnormal   Collection Time: 09/22/15  6:08 PM  Result Value Ref Range   Squamous Epithelial / LPF 0-5 (A) NONE SEEN   WBC, UA 0-5 0 - 5 WBC/hpf   RBC / HPF TOO NUMEROUS TO COUNT 0 - 5 RBC/hpf   Bacteria, UA RARE (A) NONE SEEN  Basic metabolic panel     Status: Abnormal   Collection Time: 09/23/15  2:22 AM  Result Value Ref Range   Sodium 142 135 - 145 mmol/L   Potassium 3.1 (L) 3.5 - 5.1 mmol/L   Chloride 102 101 - 111 mmol/L   CO2 31 22 - 32 mmol/L   Glucose, Bld 104 (H) 65 - 99 mg/dL   BUN 38 (H) 6 - 20 mg/dL   Creatinine, Ser 1.82 (H) 0.61 - 1.24 mg/dL   Calcium 7.2 (L) 8.9 - 10.3 mg/dL   GFR calc non Af Amer 37 (L) >  60 mL/min   GFR calc Af Amer 43 (L) >60 mL/min    Comment: (NOTE) The eGFR has been calculated using the CKD EPI equation. This  calculation has not been validated in all clinical situations. eGFR's persistently <60 mL/min signify possible Chronic Kidney Disease.    Anion gap 9 5 - 15   US Abdomen Complete  09/23/2015  CLINICAL DATA:  65 year old male with chronic edema, abdominal discomfort and distention. Acute renal failure. EXAM: ABDOMEN ULTRASOUND COMPLETE COMPARISON:  None. FINDINGS: Gallbladder: A few mobile gallstones are identified, the largest measuring 4 mm. There is no evidence of gallbladder wall thickening, sonographic Murphy's sign or pericholecystic fluid. Common bile duct: Diameter: 3.8 mm. There is no evidence of intrahepatic or extrahepatic biliary dilatation. Is Liver: No focal lesion identified. Within normal limits in parenchymal echogenicity. IVC: No abnormality visualized. Pancreas: Visualized portion unremarkable. Spleen: Splenomegaly is noted with splenic volume of 570 cc. No focal splenic abnormalities noted. Right Kidney: Length: 12.6 cm. Echogenicity within normal limits. No mass or hydronephrosis visualized. Left Kidney: Length: 12.9 cm. Echogenicity within normal limits. No mass or hydronephrosis visualized. Abdominal aorta: An infrarenal suprailiac distal abdominal aortic aneurysm is identified measuring 3 cm. Other findings: There is no evidence of ascites. Small to moderate bilateral pleural effusions are noted. IMPRESSION: Unremarkable liver. No gross morphologic changes of cirrhosis on this examination. Splenomegaly. Small to moderate bilateral pleural effusions. No evidence of ascites. Cholelithiasis without evidence of acute cholecystitis. 3 cm distal abdominal aortic aneurysm. Electronically Signed   By: Margarette Canada M.D.   On: 09/23/2015 09:03    Review of Systems  Constitutional: Negative for fever.  Respiratory: Positive for shortness of breath. Negative for sputum production.   Cardiovascular: Positive for orthopnea and leg swelling. Negative for chest pain.  All other systems reviewed  and are negative.   Blood pressure 144/75, pulse 79, temperature 98 F (36.7 C), temperature source Oral, resp. rate 18, height 6' 4"  (1.93 m), weight 243 lb 8 oz (110.451 kg), SpO2 94 %. Physical Exam  Constitutional: He is oriented to person, place, and time. He appears well-developed and well-nourished. No distress.  HENT:  Head: Normocephalic and atraumatic.  Mouth/Throat: Oropharynx is clear and moist.  Eyes: Conjunctivae and EOM are normal. No scleral icterus.  Neck: Neck supple.  Cardiovascular: Normal rate, regular rhythm and normal heart sounds.   No murmur heard. Respiratory: Effort normal.  Diffuse wheezing. Coarse crackles in b/l bases and fine crackles elsewhere  GI: Soft. Bowel sounds are normal. He exhibits no distension. There is no tenderness. There is no rebound and no guarding.  Musculoskeletal:  3+ pitting edema b/l to knees, venous stasis ulcers healing over L shin  Lymphadenopathy:    He has no cervical adenopathy.  Neurological: He is alert and oriented to person, place, and time.  Skin: Skin is warm and dry.  Psychiatric: He has a normal mood and affect. His behavior is normal.     Assessment/Plan Edema: Concern for possible nephrotic syndrome given proteinuria (>300 on UA, in addition to TNTC RBCs), edema, and hypoalbuminemia (2.2). Small to moderate b/l pleural effusions and splenomegaly noted on abd U/S. Pulmonary edema noted on CXR on 2/10 - Obtain UP:C - proteinuria may be somewht misleading given TNTC RBCs in urine - Consider obtaining ANA, complement levels, Hep panel, and SPEP/free light chains to evaluate for possible causes of nephrotic syndrome - likely needs more lasix at higher dose to continue diuresis as he is hypervolemic currently. -  Na restriction Agree w assessment per Dr. Brita Romp, PGY-2.  Gent[place] with recently worsening LE edema now with pulm edema and A/ C renal failure. New proteinuria it appears, w microhematuria on UA.  Urine  sediment shows 25-50 rbc's per hpf with moderate dysmorphic forms and moderate casts several of which appeared to be degenerating RBC casts.  He may have a GN, have ordered serologies and myeloma work-up. No obvious underlying infection. Await urine prot-to-creat ratio and serologic w/u.  Try to diurese him and treat his pulm edema and vol overload while awaiting return of labs.    AKI: Cr baseline 1-1.2 with normal GFR, Cr increased to 1.94 > 1.82 after being given Lasix IV 8m BID and then IV 825mBID x2 doses.  Lasix currently being held. BUN 38. . - home ACEi held - avoid other nephrotoxic agents - Renal U/S with no mass or hydronephrosis and normal echogenicity - Continue to monitor with further diuresis  Hypokalemia: K3.1 this AM. Likely related to recent Lasix admin and AKI.  s/p repletion with [id number] - Continue to monitor  HTN: BPs elevated in 140s-150s/70s-90s. Likely related to extra volume. - Continue to monitor   AngVirginia CrewsD, MPH PGY-2,  [facility] 09/23/2015 11:40 AM  Pt seen, examined, agree w assess/plan as above with additions as indicated.  RobKelly Splinter CarNewell Rubbermaidger [phone] cell [phone], 1:31 PM

## 2015-09-23 NOTE — Progress Notes (Signed)
Utilization review completed.  

## 2015-09-24 ENCOUNTER — Encounter (HOSPITAL_COMMUNITY): Payer: Self-pay | Admitting: Physician Assistant

## 2015-09-24 DIAGNOSIS — I714 Abdominal aortic aneurysm, without rupture: Secondary | ICD-10-CM

## 2015-09-24 DIAGNOSIS — I7143 Infrarenal abdominal aortic aneurysm, without rupture: Secondary | ICD-10-CM

## 2015-09-24 DIAGNOSIS — R7989 Other specified abnormal findings of blood chemistry: Secondary | ICD-10-CM

## 2015-09-24 DIAGNOSIS — R778 Other specified abnormalities of plasma proteins: Secondary | ICD-10-CM

## 2015-09-24 LAB — PROTEIN / CREATININE RATIO, URINE
Creatinine, Urine: 115.97 mg/dL
PROTEIN CREATININE RATIO: 3.09 mg/mg{creat} — AB (ref 0.00–0.15)
Total Protein, Urine: 358 mg/dL

## 2015-09-24 LAB — CBC
HEMATOCRIT: 33.1 % — AB (ref 39.0–52.0)
Hemoglobin: 10.2 g/dL — ABNORMAL LOW (ref 13.0–17.0)
MCH: 26.7 pg (ref 26.0–34.0)
MCHC: 30.8 g/dL (ref 30.0–36.0)
MCV: 86.6 fL (ref 78.0–100.0)
Platelets: 180 10*3/uL (ref 150–400)
RBC: 3.82 MIL/uL — AB (ref 4.22–5.81)
RDW: 16.7 % — AB (ref 11.5–15.5)
WBC: 8.5 10*3/uL (ref 4.0–10.5)

## 2015-09-24 LAB — BASIC METABOLIC PANEL
Anion gap: 8 (ref 5–15)
BUN: 38 mg/dL — AB (ref 6–20)
CHLORIDE: 104 mmol/L (ref 101–111)
CO2: 30 mmol/L (ref 22–32)
Calcium: 7.7 mg/dL — ABNORMAL LOW (ref 8.9–10.3)
Creatinine, Ser: 1.8 mg/dL — ABNORMAL HIGH (ref 0.61–1.24)
GFR calc Af Amer: 44 mL/min — ABNORMAL LOW (ref >60)
GFR calc non Af Amer: 38 mL/min — ABNORMAL LOW (ref >60)
Glucose, Bld: 111 mg/dL — ABNORMAL HIGH (ref 65–99)
POTASSIUM: 3.5 mmol/L (ref 3.5–5.1)
SODIUM: 142 mmol/L (ref 135–145)

## 2015-09-24 LAB — HEPATITIS PANEL, ACUTE
HEP B C IGM: NEGATIVE
HEP B S AG: NEGATIVE
Hep A IgM: NEGATIVE

## 2015-09-24 LAB — KAPPA/LAMBDA LIGHT CHAINS
Kappa free light chain: 219.11 mg/L — ABNORMAL HIGH (ref 3.30–19.40)
Kappa, lambda light chain ratio: 3.26 — ABNORMAL HIGH (ref 0.26–1.65)
Lambda free light chains: 67.26 mg/L — ABNORMAL HIGH (ref 5.71–26.30)

## 2015-09-24 MED ORDER — NEBIVOLOL HCL 5 MG PO TABS
5.0000 mg | ORAL_TABLET | Freq: Every day | ORAL | Status: DC
  Administered 2015-09-24 – 2015-09-30 (×5): 5 mg via ORAL
  Filled 2015-09-24 (×8): qty 1

## 2015-09-24 MED ORDER — POTASSIUM CHLORIDE CRYS ER 20 MEQ PO TBCR
40.0000 meq | EXTENDED_RELEASE_TABLET | Freq: Every day | ORAL | Status: DC
  Administered 2015-09-24 – 2015-09-26 (×3): 40 meq via ORAL
  Filled 2015-09-24 (×3): qty 2

## 2015-09-24 NOTE — Progress Notes (Signed)
[name] Progress Note    Assessment/ Plan:   Edema, proteinuria, AKI: Concern for possible nephrotic syndrome given proteinuria (>300 on UA, in addition to TNTC RBCs), edema, and hypoalbuminemia (2.2). Must also consider acute GN given hematuria and RBC casts noted in urine.  Small to moderate b/l pleural effusions and splenomegaly noted on abd U/S. Pulmonary edema noted on CXR on 2/10. Renal U/S with no mass or hydronephrosis and normal echogenicity. Cr baseline 1-1.2 with normal GFR, Cr increased to 1.94 > 1.82 >1.8 this AM - UP:George [name], ANA, ANCA, anti-GBM, complement levels, Hep panel, SPEP/UPEP/free light chains all pending - home ACEi held - avoid other nephrotoxic meds - Continue lasix 120mg  IV BID - Na restriction and fluid restriction  Hypokalemia: Resolved. K3.5 this AM. - Continue to monitor in setting of Lasix use  HTN: BPs elevated in 130s-160s/70s-110. Likely related to extra volume. - Continue to monitor - likely to improve with further diuresis  Subjective:   Reports that he is urinating well and not SOB. Edema is stable   Objective:   BP 138/78 mmHg  Pulse 86  Temp(Src) 98.6 F (37 George [name]) (Oral)  Resp 20  Ht 6\' 4"  (1.93 m)  Wt 241 lb 3.2 oz (109.408 kg)  BMI 29.37 kg/m2  SpO2 93%  Intake/Output Summary (Last 24 hours) at 09/24/15 0901 Last data filed at 09/24/15 0854  Gross per 24 hour  Intake   1026 ml  Output   1825 ml  Net   -799 ml   Weight change: -2 lb 4.8 oz (-1.043 kg)  Physical Exam: Gen: Laying in bed comfortably, NAD CVS: RRR, no m/r/g +JVD Resp: Normal WOB, on [place], Diffuse wheezes, fine crackles diffusely and coarse crackles in bases Abd: Soft, NTND, +BS Ext: 3+ LE edema b/l, venous stasis ulcers healing over L shin  Imaging: US Abdomen Complete  09/23/2015  CLINICAL DATA:  65 year old male with chronic edema, abdominal discomfort and distention. Acute renal failure. EXAM: ABDOMEN ULTRASOUND COMPLETE COMPARISON:  None. FINDINGS:  Gallbladder: A few mobile gallstones are identified, the largest measuring 4 mm. There is no evidence of gallbladder wall thickening, sonographic Murphy's sign or pericholecystic fluid. Common bile duct: Diameter: 3.8 mm. There is no evidence of intrahepatic or extrahepatic biliary dilatation. Is Liver: No focal lesion identified. Within normal limits in parenchymal echogenicity. IVC: No abnormality visualized. Pancreas: Visualized portion unremarkable. Spleen: Splenomegaly is noted with splenic volume of 570 cc. No focal splenic abnormalities noted. Right Kidney: Length: 12.6 cm. Echogenicity within normal limits. No mass or hydronephrosis visualized. Left Kidney: Length: 12.9 cm. Echogenicity within normal limits. No mass or hydronephrosis visualized. Abdominal aorta: An infrarenal suprailiac distal abdominal aortic aneurysm is identified measuring 3 cm. Other findings: There is no evidence of ascites. Small to moderate bilateral pleural effusions are noted. IMPRESSION: Unremarkable liver. No gross morphologic changes of cirrhosis on this examination. Splenomegaly. Small to moderate bilateral pleural effusions. No evidence of ascites. Cholelithiasis without evidence of acute cholecystitis. 3 cm distal abdominal aortic aneurysm. Electronically Signed   By: George George [name] M.D.   On: 09/23/2015 09:03    Labs: BMET  Recent Labs Lab 09/20/15 1738 09/21/15 0600 09/22/15 0244 09/23/15 0222 09/24/15 0430  NA 143 142 143 142 142  K 3.4* 3.4* 3.3* 3.1* 3.5  CL 98* 99* 101 102 104  CO2 31 33* 32 31 30  GLUCOSE 108* 104* 136* 104* 111*  BUN 31* 31* 40* 38* 38*  CREATININE 1.46* 1.57* 1.94* 1.82* 1.80*  CALCIUM 8.0* 7.6* 7.1* 7.2* 7.7*   CBC  Recent Labs Lab 09/20/15 2000 09/24/15 0430  WBC 7.9 8.5  NEUTROABS 6.7  --   HGB 10.0* 10.2*  HCT 31.4* 33.1*  MCV 87.7 86.6  PLT 202 180    Medications:    . aspirin EC  81 mg Oral Daily  . enoxaparin (LOVENOX) injection  40 mg Subcutaneous Q24H  .  furosemide  120 mg Intravenous 3 times per day  . guaiFENesin  1,200 mg Oral BID  . mometasone-formoterol  2 puff Inhalation BID  . sodium chloride flush  3 mL Intravenous Q12H     George Crews, MD, MPH PGY-2,  [facility] 09/24/2015 9:01 AM   Renal Panel: Elevated kappa light chain and pos Hep George [name] antibody.  Will await remaining labs and consider next step which will probably be a renal biopsy and bone survey George George [name]

## 2015-09-24 NOTE — Progress Notes (Deleted)
SATURATION QUALIFICATIONS: (This note is used to comply with regulatory documentation for home oxyg Patient Saturations on Room Air    92%%  Patient Saturations on Room Air while Ambulating =88 Patient Saturations on2 Liters of oxygen while Ambulating = 94 -95%  Please briefly explain why patient needs home oxygen: desats on room air and recuperate witht 02on @ 2l m/[place] Feeling short winded on ambulation coming back to room

## 2015-09-24 NOTE — Progress Notes (Addendum)
[facility] PROGRESS NOTE  George KIRACOFE [id number] DOB: [birth year] DOA: 09/20/2015 PCP: [name], FNP  Assessment/Plan: 1. Anasarca/Proteinuria/Hypoalbuminemia -likely has nephrotic syndrome or GN given proteinuria/hematuria -ECHO without CHF per Cards, Liver US without signs of Cirrhosis -Suspect poor response to Lasix is due to severity of hypoalbuminemia -Continue High dose Iv lasix per Renal, workup for GN ongoing  diuretics, renal Dr. Jonnie Finner consulted -I will sign off, nothing further to add, management per Renal and Cards  2. Hypokalemia -replace  3. Hyperglycemia -no h/o DM, Hbaic 5.2  4. COPD -stable, nebs PRN  5. Leg ulcers -wound care  6. TSH slightly up but T4 WNL, consistent with Sick Euthyroid syndrome -doesn't need Synthroid, repeat in 3 months  7. ? HCAP -doubt this diagnosis, STopped Zosyn 2/12  DVt proph: lovenox  Code Status: Full Code Family Communication: none at bedside Disposition Plan: Home when improved   Consultants:  HPI/Subjective: Feels a little better today  Objective: Filed Vitals:   09/24/15 0746 09/24/15 1100  BP: 138/78 157/79  Pulse: 86 85  Temp: 98.6 F (37 C) 98.4 F (36.9 C)  Resp: 20 18    Intake/Output Summary (Last 24 hours) at 09/24/15 1311 Last data filed at 09/24/15 1241  Gross per 24 hour  Intake   1046 ml  Output   2250 ml  Net  -1204 ml   Filed Weights   09/22/15 0526 09/23/15 0621 09/24/15 0415  Weight: 110.496 kg (243 lb 9.6 oz) 110.451 kg (243 lb 8 oz) 109.408 kg (241 lb 3.2 oz)    Exam:   General:  AAOx3  Cardiovascular: S1S2/RRR  Respiratory: diminished BS at bases  Abdomen: soft, Nt, BS present  Musculoskeletal: 3plus edema, chronic ulcers   Data Reviewed: Basic Metabolic Panel:  Recent Labs Lab 09/20/15 1738 09/21/15 0600 09/22/15 0244 09/23/15 0222 09/24/15 0430  NA 143 142 143 142 142  K 3.4* 3.4* 3.3* 3.1* 3.5  CL 98* 99* 101 102 104  CO2 31 33* 32 31  30  GLUCOSE 108* 104* 136* 104* 111*  BUN 31* 31* 40* 38* 38*  CREATININE 1.46* 1.57* 1.94* 1.82* 1.80*  CALCIUM 8.0* 7.6* 7.1* 7.2* 7.7*  MG 2.0  --   --   --   --    Liver Function Tests:  Recent Labs Lab 09/20/15 1738  AST 40  ALT 17  ALKPHOS 56  BILITOT 0.8  PROT 4.4*  ALBUMIN 2.2*   No results for input(s): LIPASE, AMYLASE in the last 168 hours. No results for input(s): AMMONIA in the last 168 hours. CBC:  Recent Labs Lab 09/20/15 2000 09/24/15 0430  WBC 7.9 8.5  NEUTROABS 6.7  --   HGB 10.0* 10.2*  HCT 31.4* 33.1*  MCV 87.7 86.6  PLT 202 180   Cardiac Enzymes:  Recent Labs Lab 09/20/15 1738 09/20/15 2314 09/21/15 0600  TROPONINI 0.15* 0.15* 0.15*   BNP (last 3 results)  Recent Labs  08/22/15 0101 09/20/15 1738  BNP 465.9* 980.5*    ProBNP (last 3 results)  Recent Labs  08/29/15 1554  PROBNP 894.0*    CBG: No results for input(s): GLUCAP in the last 168 hours.  Recent Results (from the past 240 hour(s))  Culture, respiratory (NON-Expectorated)     Status: None   Collection Time: 09/20/15  5:10 AM  Result Value Ref Range Status   Specimen Description EXPECTORATED SPUTUM  Final   Special Requests NONE  Final   Gram Stain   Final  ABUNDANT WBC PRESENT,BOTH PMN AND MONONUCLEAR FEW SQUAMOUS EPITHELIAL CELLS PRESENT MODERATE GRAM POSITIVE COCCI IN PAIRS FEW GRAM POSITIVE RODS Performed at [facility]    Culture   Final    NORMAL OROPHARYNGEAL FLORA Performed at [facility]    Report Status 09/23/2015 FINAL  Final  Culture, expectorated sputum-assessment     Status: None   Collection Time: 09/20/15  9:31 AM  Result Value Ref Range Status   Specimen Description EXPECTORATED SPUTUM  Final   Special Requests Normal  Final   Sputum evaluation   Final    THIS SPECIMEN IS ACCEPTABLE. RESPIRATORY CULTURE REPORT TO FOLLOW.   Report Status 09/21/2015 FINAL  Final  Culture, blood (Routine X 2) w Reflex to ID Panel      Status: None (Preliminary result)   Collection Time: 09/20/15  5:42 PM  Result Value Ref Range Status   Specimen Description BLOOD RIGHT ANTECUBITAL  Final   Special Requests BOTTLES DRAWN AEROBIC AND ANAEROBIC 5CC  Final   Culture NO GROWTH 3 DAYS  Final   Report Status PENDING  Incomplete  Culture, blood (Routine X 2) w Reflex to ID Panel     Status: None (Preliminary result)   Collection Time: 09/20/15  5:46 PM  Result Value Ref Range Status   Specimen Description BLOOD LEFT ANTECUBITAL  Final   Special Requests BOTTLES DRAWN AEROBIC AND ANAEROBIC 5CC  Final   Culture NO GROWTH 3 DAYS  Final   Report Status PENDING  Incomplete     Studies: US Abdomen Complete  09/23/2015  CLINICAL DATA:  65 year old male with chronic edema, abdominal discomfort and distention. Acute renal failure. EXAM: ABDOMEN ULTRASOUND COMPLETE COMPARISON:  None. FINDINGS: Gallbladder: A few mobile gallstones are identified, the largest measuring 4 mm. There is no evidence of gallbladder wall thickening, sonographic Murphy's sign or pericholecystic fluid. Common bile duct: Diameter: 3.8 mm. There is no evidence of intrahepatic or extrahepatic biliary dilatation. Is Liver: No focal lesion identified. Within normal limits in parenchymal echogenicity. IVC: No abnormality visualized. Pancreas: Visualized portion unremarkable. Spleen: Splenomegaly is noted with splenic volume of 570 cc. No focal splenic abnormalities noted. Right Kidney: Length: 12.6 cm. Echogenicity within normal limits. No mass or hydronephrosis visualized. Left Kidney: Length: 12.9 cm. Echogenicity within normal limits. No mass or hydronephrosis visualized. Abdominal aorta: An infrarenal suprailiac distal abdominal aortic aneurysm is identified measuring 3 cm. Other findings: There is no evidence of ascites. Small to moderate bilateral pleural effusions are noted. IMPRESSION: Unremarkable liver. No gross morphologic changes of cirrhosis on this examination.  Splenomegaly. Small to moderate bilateral pleural effusions. No evidence of ascites. Cholelithiasis without evidence of acute cholecystitis. 3 cm distal abdominal aortic aneurysm. Electronically Signed   By: Margarette Canada M.D.   On: 09/23/2015 09:03    Scheduled Meds: . aspirin EC  81 mg Oral Daily  . enoxaparin (LOVENOX) injection  40 mg Subcutaneous Q24H  . furosemide  120 mg Intravenous 3 times per day  . guaiFENesin  1,200 mg Oral BID  . mometasone-formoterol  2 puff Inhalation BID  . potassium chloride  40 mEq Oral Daily  . sodium chloride flush  3 mL Intravenous Q12H   Continuous Infusions:  Antibiotics Given (last 72 hours)    Date/Time Action Medication Dose Rate   09/21/15 1412 Given   piperacillin-tazobactam (ZOSYN) IVPB 3.375 g 3.375 g 12.5 mL/hr   09/21/15 2150 Given   piperacillin-tazobactam (ZOSYN) IVPB 3.375 g 3.375 g 12.5 mL/hr  09/22/15 0522 Given   piperacillin-tazobactam (ZOSYN) IVPB 3.375 g 3.375 g 12.5 mL/hr   09/22/15 1516 Given   piperacillin-tazobactam (ZOSYN) IVPB 3.375 g 3.375 g 12.5 mL/hr   09/22/15 2121 Given   piperacillin-tazobactam (ZOSYN) IVPB 3.375 g 3.375 g 12.5 mL/hr   09/23/15 0501 Given   piperacillin-tazobactam (ZOSYN) IVPB 3.375 g 3.375 g 12.5 mL/hr   09/23/15 1400 Given   piperacillin-tazobactam (ZOSYN) IVPB 3.375 g 3.375 g 12.5 mL/hr      Principal Problem:   Acute respiratory failure (HCC) Active Problems:   Essential hypertension   COPD exacerbation (HCC)   Acute on chronic systolic CHF (congestive heart failure) (HCC)   AVNRT (AV nodal re-entry tachycardia) ([place])   Venous insufficiency of both lower extremities   Acute renal insufficiency   Hypokalemia   Hypothyroidism   Acute renal failure (ARF) (HCC)   AAA (abdominal aortic aneurysm) ([place])   Elevated troponin    Time spent: [facility] Pager [phone]. If 7PM-7AM, please contact night-coverage at [email or web address], password [facility] 09/24/2015,  1:11 PM  LOS: 4 days

## 2015-09-24 NOTE — Progress Notes (Addendum)
Patient: George [name] / Admit Date: 09/20/2015 / Date of Encounter: 09/24/2015, 12:08 PM  Subjective: Does not feel short of breath. No chest pain. LEE still quite pronounced. He says he is urinating quite frequently but weights haven't changed much.   Objective: Telemetry: NSR/brief sinus tach at times Physical Exam: Blood pressure 157/79, pulse 85, temperature 98.4 F (36.9 C), temperature source Oral, resp. rate 18, height 6\' 4"  (1.93 m), weight 241 lb 3.2 oz (109.408 kg), SpO2 99 %. General: Well developed, well nourished WM, in no acute distress. Laying flat in bed. Head: Normocephalic, atraumatic, sclera non-icteric, no xanthomas, nares are without discharge. Neck: JVP not elevated. Lungs: Diminished at lung bases L>R. Otherwise no wheezes, rales, or rhonchi. Breathing is unlabored. Heart: RRR S1 S2 without murmurs, rubs, or gallops.  Abdomen: Soft, non-tender, non-distended with normoactive bowel sounds. No rebound/guarding. Extremities: No clubbing or cyanosis. 1-2+ soft symmetrical BLE edema without erythema. Distal pedal pulses are 2+ and equal bilaterally. Neuro: Alert and oriented X 3. Moves all extremities spontaneously. Psych:  Responds to questions appropriately with a normal affect.   Intake/Output Summary (Last 24 hours) at 09/24/15 1208 Last data filed at 09/24/15 0920  Gross per 24 hour  Intake   1046 ml  Output   1975 ml  Net   -929 ml    Inpatient Medications:  . aspirin EC  81 mg Oral Daily  . enoxaparin (LOVENOX) injection  40 mg Subcutaneous Q24H  . furosemide  120 mg Intravenous 3 times per day  . guaiFENesin  1,200 mg Oral BID  . mometasone-formoterol  2 puff Inhalation BID  . potassium chloride  40 mEq Oral Daily  . sodium chloride flush  3 mL Intravenous Q12H   Infusions:    Labs:  Recent Labs  09/23/15 0222 09/24/15 0430  NA 142 142  K 3.1* 3.5  CL 102 104  CO2 31 30  GLUCOSE 104* 111*  BUN 38* 38*  CREATININE 1.82* 1.80*  CALCIUM  7.2* 7.7*    Recent Labs  09/24/15 0430  WBC 8.5  HGB 10.2*  HCT 33.1*  MCV 86.6  PLT 180    Radiology/Studies:  Dg Chest 2 View  09/21/2015  CLINICAL DATA:  CHF.  Cough. EXAM: CHEST  2 VIEW COMPARISON:  09/11/2015.  05/22/2015 .  08/23/2008. FINDINGS: Mediastinum hilar structures normal. Cardiomegaly with pulmonary vascular prominence and bilateral interstitial prominence with basilar pulmonary alveolar infiltrates. Findings consistent with congestive heart failure with pulmonary edema and pleural effusions. Basilar pneumonia cannot be excluded . Underlying chronic interstitial lung disease is present. No pleural effusion or pneumothorax . IMPRESSION: 1. Congestive heart failure with bilateral pulmonary edema and pleural effusion. 2. Underlying chronic interstitial lung disease. Electronically Signed   By: Marcello Moores  Register   On: 09/21/2015 07:49   Dg Chest 2 View  09/11/2015  CLINICAL DATA:  Cardiac catheterization.  Shortness of breath . EXAM: CHEST  2 VIEW COMPARISON:  CT 08/22/2015. Chest x-ray 08/22/2015, 08/20/2015, 05/22/2015. FINDINGS: Cardiomegaly with diffuse bilateral pulmonary interstitial prominence including Kerley B-lines. Component of pulmonary interstitial edema and/or pneumonitis present this fashion. Underlying chronic interstitial lung disease is also most likely present. Small bilateral pleural effusions cannot be excluded. No pneumothorax . IMPRESSION: Cardiomegaly with mild diffuse pulmonary interstitial prominence including Kerley B-lines. Small pleural effusions. Findings most consistent with congestive heart failure with mild interstitial edema. A component of underlying chronic interstitial lung disease is also most likely present . Electronically Signed   By: Marcello Moores  Register   On: 09/11/2015 07:27   US Abdomen Complete  09/23/2015  CLINICAL DATA:  65 year old male with chronic edema, abdominal discomfort and distention. Acute renal failure. EXAM: ABDOMEN ULTRASOUND  COMPLETE COMPARISON:  None. FINDINGS: Gallbladder: A few mobile gallstones are identified, the largest measuring 4 mm. There is no evidence of gallbladder wall thickening, sonographic Murphy's sign or pericholecystic fluid. Common bile duct: Diameter: 3.8 mm. There is no evidence of intrahepatic or extrahepatic biliary dilatation. Is Liver: No focal lesion identified. Within normal limits in parenchymal echogenicity. IVC: No abnormality visualized. Pancreas: Visualized portion unremarkable. Spleen: Splenomegaly is noted with splenic volume of 570 cc. No focal splenic abnormalities noted. Right Kidney: Length: 12.6 cm. Echogenicity within normal limits. No mass or hydronephrosis visualized. Left Kidney: Length: 12.9 cm. Echogenicity within normal limits. No mass or hydronephrosis visualized. Abdominal aorta: An infrarenal suprailiac distal abdominal aortic aneurysm is identified measuring 3 cm. Other findings: There is no evidence of ascites. Small to moderate bilateral pleural effusions are noted. IMPRESSION: Unremarkable liver. No gross morphologic changes of cirrhosis on this examination. Splenomegaly. Small to moderate bilateral pleural effusions. No evidence of ascites. Cholelithiasis without evidence of acute cholecystitis. 3 cm distal abdominal aortic aneurysm. Electronically Signed   By: Margarette Canada M.D.   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 Initially felt ? CHF (given prior h/o EF 40-45%) but f/u 2D Echo 09/21/15 showed EF 50-55%, normal LV diastolic parameters. With diuretics, he developed AKI with Cr up to 1.94. Labs also notable for troponins 0.15x3, albumin 2.2, total protein 4.4, TSH 4.8 (normal fT4), Hgb 10 (c/w prior), UA with large Hgb, small leuks, >300 protein. Abd [place] with small-mod bilateral  effusions, +splenomegaly, + cholelithiasis without cholecystitis, no ascites, unremarkable liver, 3cm distal AAA.  1. Anasarca - process not felt specifically related to CHF at this time (normal EF, normal diastolic parameters). Concern for possible systemic process given worsening renal function, significant hypoalbuminemia, proteinuria and RBCs on UA. ?Nephrotic syndrome with possible GN. Workup underway. Also note splenomegaly of unclear significance on [place] as well as transient fever this AM. Appreciate IM/nephrology input.  2. AKI with hypokalemia - appreciate nephrology input. With high dose Lasix on board, will add scheduled KCl 68meq daily.  3. Essential HTN - ACEI on hold due to AKI. May need to substitute a different agent for his HTN although this could ultimately be driven up by nephrotic-type picture. He was on metoprolol in the past but this was stopped due to possible contribution to COPD-driven bronchospasm. Could consider a more selective agent given his borderline sinus tach on telemetry. Will review with MD.  4. 3cm AAA - will need periodic surveillance.  5. Elevated troponin - do not see this specifically commented on in prior notes. Nuc 05/2015: inferior wall attenuation at stress/rest likely due to diaphragmatic attenuation given lack of WMA (scarring is a differential consideration), no ischemia, EF 65%. With improved LV function on echo, doubt clinically significant at this time but will ask MD to comment. Continue ASA.  Signed, Melina Copa PA-C Pager: [phone]   I have seen, examined and evaluated the patient this PM along with Mrs. Dunn, PA-c.  After reviewing all the available data and chart,  I agree with her findings, examination as well as impression recommendations.  Admitted  for worsening edema - that appears to be primarily related to ? Nephrotic syndrome & not a cardiac issue. With ACE-I on hold, will use BB for BP control as HR has trended up (& h/o SVT).  Starting  [employer]. Baseline renal function appeared to be normal - no underlying CKD.  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  Troponin elevation in the absence of dyspnea or CP & no WMA on Echo is probably more related to renal dysfunction than cardiac. - flat trend.  May be a negative prognostic indicator, but does not warrant ischemic evaluation.  As there are truly no active Cardiac issues @ present, I have requested that [facility] take over as primary with Nephrology guidance. Pt does not require Cardiologist rounding cost.     Leonie Man, M.D., M.S. Interventional Cardiologist   Pager # [phone] Phone # [phone] [address] [place], [place] [address]

## 2015-09-24 NOTE — Care Management Important Message (Signed)
Important Message  Patient Details  Name: George [name] MRN: [id number] Date of Birth: 08/17/1950   Medicare Important Message Given:  Yes    [name] P [place] 09/24/2015, 1:34 PM

## 2015-09-25 ENCOUNTER — Encounter (HOSPITAL_BASED_OUTPATIENT_CLINIC_OR_DEPARTMENT_OTHER): Payer: Medicare Other

## 2015-09-25 ENCOUNTER — Inpatient Hospital Stay (HOSPITAL_COMMUNITY): Payer: Medicare Other

## 2015-09-25 LAB — PROTEIN ELECTROPHORESIS, SERUM
A/G Ratio: 0.8 (ref 0.7–1.7)
ALPHA-1-GLOBULIN: 0.4 g/dL (ref 0.0–0.4)
ALPHA-2-GLOBULIN: 0.7 g/dL (ref 0.4–1.0)
Albumin ELP: 1.8 g/dL — ABNORMAL LOW (ref 2.9–4.4)
BETA GLOBULIN: 0.7 g/dL (ref 0.7–1.3)
GLOBULIN, TOTAL: 2.4 g/dL (ref 2.2–3.9)
Gamma Globulin: 0.6 g/dL (ref 0.4–1.8)
Total Protein ELP: 4.2 g/dL — ABNORMAL LOW (ref 6.0–8.5)

## 2015-09-25 LAB — ANTINUCLEAR ANTIBODIES, IFA: ANTINUCLEAR ANTIBODIES, IFA: NEGATIVE

## 2015-09-25 LAB — MPO/PR-3 (ANCA) ANTIBODIES: ANCA Proteinase 3: <3.5 U/mL (ref 0.0–3.5)

## 2015-09-25 LAB — BASIC METABOLIC PANEL
ANION GAP: 10 (ref 5–15)
BUN: 38 mg/dL — ABNORMAL HIGH (ref 6–20)
CALCIUM: 7.7 mg/dL — AB (ref 8.9–10.3)
CO2: 31 mmol/L (ref 22–32)
Chloride: 100 mmol/L — ABNORMAL LOW (ref 101–111)
Creatinine, Ser: 1.52 mg/dL — ABNORMAL HIGH (ref 0.61–1.24)
GFR, EST AFRICAN AMERICAN: 53 mL/min — AB (ref >60)
GFR, EST NON AFRICAN AMERICAN: 46 mL/min — AB (ref >60)
Glucose, Bld: 123 mg/dL — ABNORMAL HIGH (ref 65–99)
POTASSIUM: 3.9 mmol/L (ref 3.5–5.1)
Sodium: 141 mmol/L (ref 135–145)

## 2015-09-25 LAB — C4 COMPLEMENT: Complement C4, Body Fluid: 3 mg/dL — ABNORMAL LOW (ref 14–44)

## 2015-09-25 LAB — C3 COMPLEMENT: C3 COMPLEMENT: 74 mg/dL — AB (ref 82–167)

## 2015-09-25 LAB — IMMUNOFIXATION, URINE

## 2015-09-25 LAB — GLOMERULAR BASEMENT MEMBRANE ANTIBODIES: GBM AB: 3 U (ref 0–20)

## 2015-09-25 LAB — CULTURE, BLOOD (ROUTINE X 2)
CULTURE: NO GROWTH
Culture: NO GROWTH

## 2015-09-25 LAB — HEMOGLOBIN A1C
HEMOGLOBIN A1C: 5.2 % (ref 4.8–5.6)
MEAN PLASMA GLUCOSE: 103 mg/dL

## 2015-09-25 MED ORDER — BUTALBITAL-APAP-CAFFEINE 50-325-40 MG PO TABS
1.0000 | ORAL_TABLET | Freq: Once | ORAL | Status: AC
  Administered 2015-09-25: 1 via ORAL
  Filled 2015-09-25: qty 1

## 2015-09-25 MED ORDER — ENOXAPARIN SODIUM 40 MG/0.4ML ~~LOC~~ SOLN
40.0000 mg | SUBCUTANEOUS | Status: DC
  Administered 2015-09-25: 40 mg via SUBCUTANEOUS
  Filled 2015-09-25: qty 0.4

## 2015-09-25 MED ORDER — TECHNETIUM TC 99M MEDRONATE IV KIT
25.0000 | PACK | Freq: Once | INTRAVENOUS | Status: AC | PRN
  Administered 2015-09-25: 25 via INTRAVENOUS

## 2015-09-25 NOTE — Progress Notes (Signed)
[facility] PROGRESS NOTE  George [name] [id number] DOB: Jan 26, 1951 DOA: 09/20/2015 PCP: [name], FNP  Assessment/Plan: 1. Anasarca/Proteinuria/Hypoalbuminemia -likely has nephrotic syndrome or GN given proteinuria/hematuria -urine Protein/creat ratio >3 -ECHO without CHF per Cards, Liver US without signs of Cirrhosis -Finally starting to diurese, continue high dose IV lasix per Rena; -Kappa free light chains elevated, check Bone scan -Renal to decide regarding biopsy -creatinine improving with diuresis  2. Hypokalemia -replace  3. Hyperglycemia -no h/o DM, Hbaic 5.2  4. COPD -stable, nebs PRN  5. Leg ulcers -wound care  6. TSH slightly up but T4 WNL, consistent with Sick Euthyroid syndrome -doesn't need Synthroid, repeat in 3 months  7. ? HCAP -doubt this diagnosis, STopped Zosyn 2/12  DVt proph: lovenox  Code Status: Full Code Family Communication: none at bedside Disposition Plan: Home when improved   Consultants:  HPI/Subjective: Feels a little better today  Objective: Filed Vitals:   09/25/15 0611 09/25/15 0845  BP: 146/52   Pulse: 92 97  Temp: 98.3 F (36.8 C)   Resp: 18 18    Intake/Output Summary (Last 24 hours) at 09/25/15 0953 Last data filed at 09/25/15 0913  Gross per 24 hour  Intake   1179 ml  Output   2325 ml  Net  -1146 ml   Filed Weights   09/23/15 0621 09/24/15 0415 09/25/15 0611  Weight: 110.451 kg (243 lb 8 oz) 109.408 kg (241 lb 3.2 oz) 106.232 kg (234 lb 3.2 oz)    Exam:   General:  AAOx3  Cardiovascular: S1S2/RRR  Respiratory: diminished BS at bases  Abdomen: soft, Nt, BS present  Musculoskeletal: 3plus edema, chronic ulcers   Data Reviewed: Basic Metabolic Panel:  Recent Labs Lab 09/20/15 1738 09/21/15 0600 09/22/15 0244 09/23/15 0222 09/24/15 0430 09/25/15 0328  NA 143 142 143 142 142 141  K 3.4* 3.4* 3.3* 3.1* 3.5 3.9  CL 98* 99* 101 102 104 100*  CO2 31 33* 32 31 30 31   GLUCOSE  108* 104* 136* 104* 111* 123*  BUN 31* 31* 40* 38* 38* 38*  CREATININE 1.46* 1.57* 1.94* 1.82* 1.80* 1.52*  CALCIUM 8.0* 7.6* 7.1* 7.2* 7.7* 7.7*  MG 2.0  --   --   --   --   --    Liver Function Tests:  Recent Labs Lab 09/20/15 1738  AST 40  ALT 17  ALKPHOS 56  BILITOT 0.8  PROT 4.4*  ALBUMIN 2.2*   No results for input(s): LIPASE, AMYLASE in the last 168 hours. No results for input(s): AMMONIA in the last 168 hours. CBC:  Recent Labs Lab 09/20/15 2000 09/24/15 0430  WBC 7.9 8.5  NEUTROABS 6.7  --   HGB 10.0* 10.2*  HCT 31.4* 33.1*  MCV 87.7 86.6  PLT 202 180   Cardiac Enzymes:  Recent Labs Lab 09/20/15 1738 09/20/15 2314 09/21/15 0600  TROPONINI 0.15* 0.15* 0.15*   BNP (last 3 results)  Recent Labs  08/22/15 0101 09/20/15 1738  BNP 465.9* 980.5*    ProBNP (last 3 results)  Recent Labs  08/29/15 1554  PROBNP 894.0*    CBG: No results for input(s): GLUCAP in the last 168 hours.  Recent Results (from the past 240 hour(s))  Culture, respiratory (NON-Expectorated)     Status: None   Collection Time: 09/20/15  5:10 AM  Result Value Ref Range Status   Specimen Description EXPECTORATED SPUTUM  Final   Special Requests NONE  Final   Gram Stain   Final  ABUNDANT WBC PRESENT,BOTH PMN AND MONONUCLEAR FEW SQUAMOUS EPITHELIAL CELLS PRESENT MODERATE GRAM POSITIVE COCCI IN PAIRS FEW GRAM POSITIVE RODS Performed at [facility]    Culture   Final    NORMAL OROPHARYNGEAL FLORA Performed at [facility]    Report Status 09/23/2015 FINAL  Final  Culture, expectorated sputum-assessment     Status: None   Collection Time: 09/20/15  9:31 AM  Result Value Ref Range Status   Specimen Description EXPECTORATED SPUTUM  Final   Special Requests Normal  Final   Sputum evaluation   Final    THIS SPECIMEN IS ACCEPTABLE. RESPIRATORY CULTURE REPORT TO FOLLOW.   Report Status 09/21/2015 FINAL  Final  Culture, blood (Routine X 2) w Reflex to ID  Panel     Status: None (Preliminary result)   Collection Time: 09/20/15  5:42 PM  Result Value Ref Range Status   Specimen Description BLOOD RIGHT ANTECUBITAL  Final   Special Requests BOTTLES DRAWN AEROBIC AND ANAEROBIC 5CC  Final   Culture NO GROWTH 4 DAYS  Final   Report Status PENDING  Incomplete  Culture, blood (Routine X 2) w Reflex to ID Panel     Status: None (Preliminary result)   Collection Time: 09/20/15  5:46 PM  Result Value Ref Range Status   Specimen Description BLOOD LEFT ANTECUBITAL  Final   Special Requests BOTTLES DRAWN AEROBIC AND ANAEROBIC 5CC  Final   Culture NO GROWTH 4 DAYS  Final   Report Status PENDING  Incomplete     Studies: No results found.  Scheduled Meds: . aspirin EC  81 mg Oral Daily  . enoxaparin (LOVENOX) injection  40 mg Subcutaneous Q24H  . furosemide  120 mg Intravenous 3 times per day  . guaiFENesin  1,200 mg Oral BID  . mometasone-formoterol  2 puff Inhalation BID  . nebivolol  5 mg Oral Daily  . potassium chloride  40 mEq Oral Daily  . sodium chloride flush  3 mL Intravenous Q12H   Continuous Infusions:  Antibiotics Given (last 72 hours)    Date/Time Action Medication Dose Rate   09/22/15 1516 Given   piperacillin-tazobactam (ZOSYN) IVPB 3.375 g 3.375 g 12.5 mL/hr   09/22/15 2121 Given   piperacillin-tazobactam (ZOSYN) IVPB 3.375 g 3.375 g 12.5 mL/hr   09/23/15 0501 Given   piperacillin-tazobactam (ZOSYN) IVPB 3.375 g 3.375 g 12.5 mL/hr   09/23/15 1400 Given   piperacillin-tazobactam (ZOSYN) IVPB 3.375 g 3.375 g 12.5 mL/hr      Principal Problem:   Acute respiratory failure (HCC) Active Problems:   Essential hypertension   COPD exacerbation (HCC)   Acute on chronic systolic CHF (congestive heart failure) (HCC)   AVNRT (AV nodal re-entry tachycardia) ([place])   Venous insufficiency of both lower extremities   Acute renal insufficiency   Hypokalemia   Hypothyroidism   Acute renal failure (ARF) (HCC)   AAA (abdominal aortic  aneurysm) ([place])   Elevated troponin    Time spent: [facility] Pager [phone]. If 7PM-7AM, please contact night-coverage at [email or web address], password [facility] 09/25/2015, 9:53 AM  LOS: 5 days

## 2015-09-25 NOTE — Progress Notes (Signed)
[facility] Progress Note    Assessment/ Plan:   Edema, proteinuria, AKI: Concern for possible nephrotic syndrome given proteinuria (>300 on UA, in addition to TNTC RBCs), edema, and hypoalbuminemia (2.2). Must also consider acute GN given hematuria and RBC casts noted in urine.  Small to moderate b/l pleural effusions and splenomegaly noted on abd U/S. Pulmonary edema noted on CXR on 2/10. Renal U/S with no mass or hydronephrosis and normal echogenicity. Cr baseline 1-1.2 with normal GFR, Cr increased to 1.94 > 1.82 >1.8 >1.52 this AM - UP:C, ANA, ANCA, anti-GBM all pending - pos Hep C Ab - Depressed complement levels and elevated free light chains - consider Renal biopsy and bone survey - home ACEi held - avoid other nephrotoxic meds - Continue lasix 120mg  IV TID - Na restriction and fluid restriction  Hypokalemia: Resolved. K3.9 this AM. - Continue to monitor in setting of Lasix use  HTN: BPs elevated in 130s-150s/70s. Likely related to extra volume. - Continue to monitor - likely to improve with further diuresis  Subjective:   Reports that he is urinating well and not SOB. Edema is stable and he cant tell if it is better   Objective:   BP 146/52 mmHg  Pulse 92  Temp(Src) 98.3 F (36.8 C) (Oral)  Resp 18  Ht 6\' 4"  (1.93 m)  Wt 234 lb 3.2 oz (106.232 kg)  BMI 28.52 kg/m2  SpO2 98%  Intake/Output Summary (Last 24 hours) at 09/25/15 0820 Last data filed at 09/25/15 0531  Gross per 24 hour  Intake   1199 ml  Output   1900 ml  Net   -701 ml   Weight change: -7 lb (-3.175 kg)  Physical Exam: Gen: Laying in bed comfortably, NAD CVS: RRR, no m/r/g Resp: Normal WOB, on [place], Diffuse wheezes, coarse crackles in bases Abd: Soft, NTND, +BS Ext: 3+ LE edema b/l, venous stasis ulcers healing over L shin  Imaging: No results found.  Labs: BMET  Recent Labs Lab 09/20/15 1738 09/21/15 0600 09/22/15 0244 09/23/15 0222 09/24/15 0430 09/25/15 0328  NA  143 142 143 142 142 141  K 3.4* 3.4* 3.3* 3.1* 3.5 3.9  CL 98* 99* 101 102 104 100*  CO2 31 33* 32 31 30 31   GLUCOSE 108* 104* 136* 104* 111* 123*  BUN 31* 31* 40* 38* 38* 38*  CREATININE 1.46* 1.57* 1.94* 1.82* 1.80* 1.52*  CALCIUM 8.0* 7.6* 7.1* 7.2* 7.7* 7.7*   CBC  Recent Labs Lab 09/20/15 2000 09/24/15 0430  WBC 7.9 8.5  NEUTROABS 6.7  --   HGB 10.0* 10.2*  HCT 31.4* 33.1*  MCV 87.7 86.6  PLT 202 180   C3 74 C4 3  Kappa free light chain 219.11 lamda free light chain 67.26  Hep C Ab >11  Medications:    . aspirin EC  81 mg Oral Daily  . enoxaparin (LOVENOX) injection  40 mg Subcutaneous Q24H  . furosemide  120 mg Intravenous 3 times per day  . guaiFENesin  1,200 mg Oral BID  . mometasone-formoterol  2 puff Inhalation BID  . nebivolol  5 mg Oral Daily  . potassium chloride  40 mEq Oral Daily  . sodium chloride flush  3 mL Intravenous Q12H     Virginia Crews, MD, MPH PGY-2,  [facility] 09/25/2015 8:20 AM   Renal Attending: He has elevated light chains in his blood and is Hep C positive.  He needs a renal biopsy to r/o light  chain deposition disease, immune complex mediated GN et al related to Hep C.  Will plan for renal biopsy on 2/15 ACP

## 2015-09-25 NOTE — Consult Note (Signed)
Chief Complaint: Patient was seen in consultation today for random renal biopsy at the request of Dr Florene Glen  Referring Physician(s): Dr Florene Glen  History of Present Illness: George [name] is a 65 y.o. male   Exacerbation COPD Worsening edema  New onset proteinuria; anasarca Hypoalbuminemia; hematuria B pleural effusions Nephrotic syndrome; glomerulonephrtis  Request per Dr Florene Glen for random renal biopsy I have seen and examined pt  Past Medical History  Diagnosis Date  . SVT (supraventricular tachycardia) ([place]) 05/22/2015  . GERD (gastroesophageal reflux disease)   . Panic attacks   . Basal cell carcinoma of right side of nose   . CHF (congestive heart failure) ([place])   . Berger's disease   . AAA (abdominal aortic aneurysm) ([place])     a. [place] 09/2015 - 3cm distal AAA.    Past Surgical History  Procedure Laterality Date  . Fracture surgery    . Tonsillectomy and adenoidectomy  ~ 1955  . Knee arthroscopy Right ~ 1985  . Femur fracture surgery Right 1970    "had plate & pin put in"  . Femur hardware removal Right 1971    "removed plate; left pin in  . Basal cell carcinoma excision Right ~ 06/2015    "side of my nose"  . Electrophysiologic study N/A 09/10/2015    Procedure: SVT Ablation;  Surgeon: Evans Lance, MD;  Location: [facility];  Service: Cardiovascular;  Laterality: N/A;    Allergies: Review of patient's allergies indicates no known allergies.  Medications: Prior to Admission medications   Medication Sig Start Date End Date Taking? Authorizing Provider  aspirin EC 81 MG EC tablet Take 1 tablet (81 mg total) by mouth daily. 05/23/15  Yes Erma Heritage, PA  furosemide (LASIX) 40 MG tablet Take 1 tablet (40 mg total) by mouth daily. 09/11/15  Yes Renee Dyane Dustman, PA-C  guaiFENesin (MUCINEX) 600 MG 12 hr tablet Take 2 tablets (1,200 mg total) by mouth 2 (two) times daily. 08/24/15  Yes Maryann Mikhail, DO  levalbuterol (XOPENEX HFA) 45 MCG/ACT  inhaler Inhale 1-2 puffs into the lungs every 4 (four) hours as needed for wheezing. 08/29/15  Yes Golden Circle, FNP  lisinopril (PRINIVIL,ZESTRIL) 5 MG tablet Take 1 tablet (5 mg total) by mouth daily. 09/11/15  Yes Renee Dyane Dustman, PA-C  zolpidem (AMBIEN) 5 MG tablet Take 1 tablet (5 mg total) by mouth at bedtime as needed for sleep. 09/06/15  Yes Hoyt Koch, MD  mometasone-formoterol (DULERA) 100-5 MCG/ACT AERO Inhale 2 puffs into the lungs 2 (two) times daily. Patient not taking: Reported on 09/21/2015 09/06/15   Hoyt Koch, MD     Family History  Problem Relation Age of Onset  . Heart disease Mother     Before age 60  . Heart failure Father   . Heart disease Father     After age 11  . Healthy Maternal Grandmother   . Healthy Maternal Grandfather   . Diabetes Paternal Grandmother   . Healthy Paternal Grandfather     Social History   Social History  . Marital Status: Divorced    Spouse Name: N/A  . Number of Children: 0  . Years of Education: 12   Occupational History  . Retired    Social History Main Topics  . Smoking status: Former Smoker -- 2.00 packs/day for 50 years    Types: Cigarettes    Quit date: 01/24/2015  . Smokeless tobacco: Never Used  . Alcohol Use:  No  . Drug Use: No  . Sexual Activity: Not Currently   Other Topics Concern  . Not on file   Social History Narrative   Fun: Trade out work, outdoors   Denies religious beliefs effecting health care.      Review of Systems: A 12 point ROS discussed and pertinent positives are indicated in the HPI above.  All other systems are negative.  Review of Systems  Constitutional: Positive for activity change, appetite change and fatigue. Negative for fever.  Respiratory: Positive for shortness of breath and wheezing.   Gastrointestinal: Negative for abdominal pain.  Genitourinary: Positive for hematuria.  Musculoskeletal: Negative for back pain and gait problem.  Neurological: Positive  for weakness.  Psychiatric/Behavioral: Negative for behavioral problems and confusion.    Vital Signs: BP 117/61 mmHg  Pulse 73  Temp(Src) 97.8 F (36.6 C) (Oral)  Resp 18  Ht 6\' 4"  (1.93 m)  Wt 234 lb 3.2 oz (106.232 kg)  BMI 28.52 kg/m2  SpO2 94%  Physical Exam  Constitutional: He is oriented to person, place, and time.  Cardiovascular: Normal rate and regular rhythm.   No murmur heard. Pulmonary/Chest: Effort normal and breath sounds normal. He has no wheezes.  Abdominal: Soft. There is no tenderness.  Musculoskeletal: Normal range of motion. He exhibits edema.  Neurological: He is alert and oriented to person, place, and time.  Skin: Skin is warm and dry.  Psychiatric: He has a normal mood and affect. His behavior is normal. Judgment and thought content normal.  Nursing note and vitals reviewed.   Mallampati Score:  MD Evaluation Airway: WNL Heart: WNL Abdomen: WNL Chest/ Lungs: WNL ASA  Classification: 3 Mallampati/Airway Score: One  Imaging: Dg Chest 2 View  09/21/2015  CLINICAL DATA:  CHF.  Cough. EXAM: CHEST  2 VIEW COMPARISON:  09/11/2015.  05/22/2015 .  08/23/2008. FINDINGS: Mediastinum hilar structures normal. Cardiomegaly with pulmonary vascular prominence and bilateral interstitial prominence with basilar pulmonary alveolar infiltrates. Findings consistent with congestive heart failure with pulmonary edema and pleural effusions. Basilar pneumonia cannot be excluded . Underlying chronic interstitial lung disease is present. No pleural effusion or pneumothorax . IMPRESSION: 1. Congestive heart failure with bilateral pulmonary edema and pleural effusion. 2. Underlying chronic interstitial lung disease. Electronically Signed   By: Marcello Moores  Register   On: 09/21/2015 07:49   Dg Chest 2 View  09/11/2015  CLINICAL DATA:  Cardiac catheterization.  Shortness of breath . EXAM: CHEST  2 VIEW COMPARISON:  CT 08/22/2015. Chest x-ray 08/22/2015, 08/20/2015, 05/22/2015.  FINDINGS: Cardiomegaly with diffuse bilateral pulmonary interstitial prominence including Kerley B-lines. Component of pulmonary interstitial edema and/or pneumonitis present this fashion. Underlying chronic interstitial lung disease is also most likely present. Small bilateral pleural effusions cannot be excluded. No pneumothorax . IMPRESSION: Cardiomegaly with mild diffuse pulmonary interstitial prominence including Kerley B-lines. Small pleural effusions. Findings most consistent with congestive heart failure with mild interstitial edema. A component of underlying chronic interstitial lung disease is also most likely present . Electronically Signed   By: Marcello Moores  Register   On: 09/11/2015 07:27   US Abdomen Complete  09/23/2015  CLINICAL DATA:  65 year old male with chronic edema, abdominal discomfort and distention. Acute renal failure. EXAM: ABDOMEN ULTRASOUND COMPLETE COMPARISON:  None. FINDINGS: Gallbladder: A few mobile gallstones are identified, the largest measuring 4 mm. There is no evidence of gallbladder wall thickening, sonographic Murphy's sign or pericholecystic fluid. Common bile duct: Diameter: 3.8 mm. There is no evidence of intrahepatic or extrahepatic biliary dilatation.  Is Liver: No focal lesion identified. Within normal limits in parenchymal echogenicity. IVC: No abnormality visualized. Pancreas: Visualized portion unremarkable. Spleen: Splenomegaly is noted with splenic volume of 570 cc. No focal splenic abnormalities noted. Right Kidney: Length: 12.6 cm. Echogenicity within normal limits. No mass or hydronephrosis visualized. Left Kidney: Length: 12.9 cm. Echogenicity within normal limits. No mass or hydronephrosis visualized. Abdominal aorta: An infrarenal suprailiac distal abdominal aortic aneurysm is identified measuring 3 cm. Other findings: There is no evidence of ascites. Small to moderate bilateral pleural effusions are noted. IMPRESSION: Unremarkable liver. No gross morphologic  changes of cirrhosis on this examination. Splenomegaly. Small to moderate bilateral pleural effusions. No evidence of ascites. Cholelithiasis without evidence of acute cholecystitis. 3 cm distal abdominal aortic aneurysm. Electronically Signed   By: Margarette Canada M.D.   On: 09/23/2015 09:03    Labs:  CBC:  Recent Labs  08/24/15 0228 09/10/15 0628 09/20/15 2000 09/24/15 0430  WBC 8.8 10.1 7.9 8.5  HGB 9.9* 10.2* 10.0* 10.2*  HCT 31.2* 32.2* 31.4* 33.1*  PLT 251 213 202 180    COAGS:  Recent Labs  08/22/15 0101 09/20/15 1738  INR 1.16 1.08  APTT  --  28    BMP:  Recent Labs  09/22/15 0244 09/23/15 0222 09/24/15 0430 09/25/15 0328  NA 143 142 142 141  K 3.3* 3.1* 3.5 3.9  CL 101 102 104 100*  CO2 32 31 30 31   GLUCOSE 136* 104* 111* 123*  BUN 40* 38* 38* 38*  CALCIUM 7.1* 7.2* 7.7* 7.7*  CREATININE 1.94* 1.82* 1.80* 1.52*  GFRNONAA 34* 37* 38* 46*  GFRAA 40* 43* 44* 53*    LIVER FUNCTION TESTS:  Recent Labs  08/22/15 1840 08/23/15 0406 08/29/15 1554 09/20/15 1738  BILITOT 0.9 0.5 0.5 0.8  AST 32 25 23 40  ALT 20 16* 25 17  ALKPHOS 65 55 56 56  PROT 5.2* 4.6* 5.2* 4.4*  ALBUMIN 2.8* 2.5* 2.9* 2.2*    TUMOR MARKERS: No results for input(s): AFPTM, CEA, CA199, CHROMGRNA in the last 8760 hours.  Assessment and Plan:  Anasarca Proteinuria; hematuria Nephrotic syndrome; glomerulonephritis Scheduled for random renal biopsy 2/15 in IR Risks and Benefits discussed with the patient including, but not limited to bleeding, infection, damage to adjacent structures or low yield requiring additional tests. All of the patient's questions were answered, patient is agreeable to proceed. Consent signed and in chart.   Thank you for this interesting consult.  I greatly enjoyed meeting George [name] and look forward to participating in their care.  A copy of this report was sent to the requesting provider on this date.  Electronically Signed: Monia Sabal  A 09/25/2015, 2:06 PM   I spent a total of 20 Minutes    in face to face in clinical consultation, greater than 50% of which was counseling/coordinating care for random renal biopsy

## 2015-09-25 NOTE — Evaluation (Signed)
Physical Therapy Evaluation Patient Details Name: George [name] MRN: [id number] DOB: 1950-08-30 Today's Date: 09/25/2015   History of Present Illness  65 y.o. male with a hx of PSVT, COPD, systolic CHF, DCM, HTN, chronic LE extremity edema from venous insufficiency with associated venous stasis ulcers. Anasarca/Proteinuria/Hypoalbuminemia.  Clinical Impression  Patient demonstrates deficits in functional mobility as indicated below. Will need continued skilled PT to address deficits and maximize function. Will see as indicated and progress as tolerated.    Follow Up Recommendations No PT follow up    Equipment Recommendations  None recommended by PT    Recommendations for Other Services       Precautions / Restrictions        Mobility  Bed Mobility Overal bed mobility: Independent                Transfers Overall transfer level: Modified independent Equipment used: Rolling walker (2 wheeled)                Ambulation/Gait Ambulation/Gait assistance: Supervision Ambulation Distance (Feet): 240 Feet Assistive device: Rolling walker (2 wheeled) Gait Pattern/deviations: WFL(Within Functional Limits)     General Gait Details: modest instability with ambulation (using RW)  Stairs            Wheelchair Mobility    Modified Rankin (Stroke Patients Only)       Balance Overall balance assessment: No apparent balance deficits (not formally assessed)                                           Pertinent Vitals/Pain Pain Assessment: No/denies pain    Home Living Family/patient expects to be discharged to:: Private residence Living Arrangements: Alone Available Help at Discharge: Family;Available PRN/intermittently Type of Home: House Home Access: Stairs to enter Entrance Stairs-Rails: Right;Left;Can reach both Entrance Stairs-Number of Steps: 4 Home Layout: One level Home Equipment: None      Prior Function Level of  Independence: Independent               Hand Dominance   Dominant Hand: Right    Extremity/Trunk Assessment   Upper Extremity Assessment: Overall WFL for tasks assessed           Lower Extremity Assessment: Overall WFL for tasks assessed         Communication   Communication: No difficulties  Cognition Arousal/Alertness: Awake/alert Behavior During Therapy: WFL for tasks assessed/performed Overall Cognitive Status: Within Functional Limits for tasks assessed                      General Comments      Exercises        Assessment/Plan    PT Assessment Patient needs continued PT services  PT Diagnosis Difficulty walking   PT Problem List Decreased activity tolerance;Decreased balance;Decreased mobility  PT Treatment Interventions DME instruction;Gait training;Stair training;Functional mobility training;Therapeutic activities;Therapeutic exercise;Balance training   PT Goals (Current goals can be found in the Care Plan section) Acute Rehab PT Goals Patient Stated Goal: to go home PT Goal Formulation: With patient Time For Goal Achievement: 10/09/15 Potential to Achieve Goals: Good    Frequency Min 3X/week   Barriers to discharge        Co-evaluation               End of Session   Activity Tolerance: Patient tolerated treatment  well Patient left: in chair;with call bell/phone within reach;with chair alarm set Nurse Communication: Mobility status         Time: 1203-1220 PT Time Calculation (min) (ACUTE ONLY): 17 min   Charges:   PT Evaluation $PT Eval Low Complexity: 1 Procedure     PT G CodesDuncan Dull 2015/10/07, 12:30 PM  Alben Deeds, [place] DPT  [phone]

## 2015-09-26 ENCOUNTER — Inpatient Hospital Stay (HOSPITAL_COMMUNITY): Payer: Medicare Other

## 2015-09-26 ENCOUNTER — Ambulatory Visit: Payer: Medicare Other | Admitting: Nurse Practitioner

## 2015-09-26 DIAGNOSIS — I471 Supraventricular tachycardia: Secondary | ICD-10-CM

## 2015-09-26 DIAGNOSIS — J9600 Acute respiratory failure, unspecified whether with hypoxia or hypercapnia: Secondary | ICD-10-CM

## 2015-09-26 DIAGNOSIS — N178 Other acute kidney failure: Secondary | ICD-10-CM

## 2015-09-26 LAB — BASIC METABOLIC PANEL
Anion gap: 7 (ref 5–15)
BUN: 38 mg/dL — ABNORMAL HIGH (ref 6–20)
CALCIUM: 7.5 mg/dL — AB (ref 8.9–10.3)
CHLORIDE: 104 mmol/L (ref 101–111)
CO2: 29 mmol/L (ref 22–32)
CREATININE: 1.57 mg/dL — AB (ref 0.61–1.24)
GFR calc Af Amer: 51 mL/min — ABNORMAL LOW (ref >60)
GFR calc non Af Amer: 44 mL/min — ABNORMAL LOW (ref >60)
GLUCOSE: 104 mg/dL — AB (ref 65–99)
Potassium: 4 mmol/L (ref 3.5–5.1)
Sodium: 140 mmol/L (ref 135–145)

## 2015-09-26 LAB — CBC
HCT: 29.3 % — ABNORMAL LOW (ref 39.0–52.0)
HEMOGLOBIN: 9.1 g/dL — AB (ref 13.0–17.0)
MCH: 27.1 pg (ref 26.0–34.0)
MCHC: 31.1 g/dL (ref 30.0–36.0)
MCV: 87.2 fL (ref 78.0–100.0)
PLATELETS: 185 10*3/uL (ref 150–400)
RBC: 3.36 MIL/uL — ABNORMAL LOW (ref 4.22–5.81)
RDW: 16.5 % — ABNORMAL HIGH (ref 11.5–15.5)
WBC: 4.8 10*3/uL (ref 4.0–10.5)

## 2015-09-26 LAB — PROTIME-INR
INR: 1.15 (ref 0.00–1.49)
PROTHROMBIN TIME: 14.9 s (ref 11.6–15.2)

## 2015-09-26 LAB — APTT: aPTT: 33 seconds (ref 24–37)

## 2015-09-26 MED ORDER — FENTANYL CITRATE (PF) 100 MCG/2ML IJ SOLN
INTRAMUSCULAR | Status: AC
  Filled 2015-09-26: qty 2

## 2015-09-26 MED ORDER — MIDAZOLAM HCL 2 MG/2ML IJ SOLN
INTRAMUSCULAR | Status: AC
  Filled 2015-09-26: qty 4

## 2015-09-26 MED ORDER — LIDOCAINE HCL (PF) 1 % IJ SOLN
INTRAMUSCULAR | Status: AC
  Filled 2015-09-26: qty 10

## 2015-09-26 MED ORDER — ENOXAPARIN SODIUM 40 MG/0.4ML ~~LOC~~ SOLN: 40.0000 mg | SUBCUTANEOUS | Status: DC

## 2015-09-26 MED ORDER — ENOXAPARIN SODIUM 40 MG/0.4ML ~~LOC~~ SOLN
40.0000 mg | SUBCUTANEOUS | Status: DC
  Administered 2015-09-28 – 2015-09-29 (×2): 40 mg via SUBCUTANEOUS
  Filled 2015-09-26 (×2): qty 0.4

## 2015-09-26 NOTE — Sedation Documentation (Signed)
pts last dose of lovenox 1700 procedure to be rescheduled

## 2015-09-26 NOTE — Progress Notes (Signed)
Physical Therapy Treatment Patient Details Name: George [name] MRN: [id number] DOB: Mar 14, 1951 Today's Date: 10/13/15    History of Present Illness 65 y.o. male with a hx of PSVT, COPD, systolic CHF, DCM, HTN, chronic LE extremity edema from venous insufficiency with associated venous stasis ulcers. Anasarca/Proteinuria/Hypoalbuminemia.    PT Comments    Patient seen for mobility progression. Patient ambulated without device, increased distance on room air with saturations >92% HR elevated 110s. Patient performed stair negotiation without difficulty. Educated on safety with mobility and energy conservation. No further acute PT needs, will sign off.  Follow Up Recommendations  No PT follow up     Equipment Recommendations  None recommended by PT    Recommendations for Other Services       Precautions / Restrictions Restrictions Weight Bearing Restrictions: No    Mobility  Bed Mobility Overal bed mobility: Independent                Transfers Overall transfer level: Independent                  Ambulation/Gait Ambulation/Gait assistance: Independent Ambulation Distance (Feet): 560 Feet Assistive device: None Gait Pattern/deviations: WFL(Within Functional Limits)     General Gait Details:  (using RW)   Stairs Stairs: Yes Stairs assistance: Modified independent (Device/Increase time) Stair Management: One rail Right;Alternating pattern Number of Stairs: 6 General stair comments: steady without assist or cues  Wheelchair Mobility    Modified Rankin (Stroke Patients Only)       Balance Overall balance assessment: No apparent balance deficits (not formally assessed)                                  Cognition Arousal/Alertness: Awake/alert Behavior During Therapy: WFL for tasks assessed/performed Overall Cognitive Status: Within Functional Limits for tasks assessed                      Exercises      General  Comments        Pertinent Vitals/Pain Pain Assessment: Faces Faces Pain Scale: Hurts a little bit Pain Location: bilateral feet Pain Descriptors / Indicators: Sore Pain Intervention(s): Monitored during session    Home Living   Living Arrangements: Alone                  Prior Function            PT Goals (current goals can now be found in the care plan section) Acute Rehab PT Goals Patient Stated Goal: to go home PT Goal Formulation: With patient Time For Goal Achievement: 10/09/15 Potential to Achieve Goals: Good Progress towards PT goals: Progressing toward goals;Goals met/education completed, patient discharged from PT    Frequency       PT Plan Current plan remains appropriate    Co-evaluation             End of Session Equipment Utilized During Treatment: Gait belt Activity Tolerance: Patient tolerated treatment well Patient left: in chair;with call bell/phone within reach;with chair alarm set     Time: 0177-9390 PT Time Calculation (min) (ACUTE ONLY): 16 min  Charges:  $Gait Training: 8-22 mins                    G CodesDuncan [name] 10-13-2015, 2:12 PM Alben Deeds, [place] DPT  [phone]

## 2015-09-26 NOTE — Progress Notes (Signed)
[name] Progress Note    Assessment/ Plan:   Edema, proteinuria, AKI: Concern for possible nephrotic syndrome given proteinuria (>300 on UA, in addition to TNTC RBCs), edema, and hypoalbuminemia (2.2). Must also consider acute GN given hematuria and RBC casts noted in urine.  Small to moderate b/l pleural effusions and splenomegaly noted on abd U/S. Pulmonary edema noted on CXR on 2/10. Renal U/S with no mass or hydronephrosis and normal echogenicity. Cr baseline 1-1.2 with normal GFR, Cr increased to 1.94 > 1.82 >1.8 >1.52 > 1.57 this AM - UP:C 3.09, ANA neg, ANCA neg, anti-GBM neg - pos Hep C Ab - RNA quant pending - Depressed complement levels and elevated free light chains - no M spike on SPEP, UPEP normal - Renal biopsy tomorrow - bone scan with increased uptake focally at junction of mid and distal thirds of right femur - bone survey today - home ACEi held - avoid other nephrotoxic meds - Continue lasix 136m IV TID - Na restriction and fluid restriction  Hypokalemia: Resolved. K4 this AM. - Continue to monitor in setting of Lasix use  HTN: BPs improved  Subjective:   Reports that he is urinating well and not SOB.wasnt able to get biopsy today   Objective:   BP 146/79 mmHg  Pulse 79  Temp(Src) 98.8 F (37.1 C) (Oral)  Resp 18  Ht _0  (1.93 m)  Wt 236 lb 9.6 oz (107.321 kg)  BMI 28.81 kg/m2  SpO2 94%  Intake/Output Summary (Last 24 hours) at 09/26/15 0802 Last data filed at 09/26/15 06962 Gross per 24 hour  Intake    906 ml  Output   2850 ml  Net  -1944 ml   Weight change: 2 lb 6.4 oz (1.089 kg)  Physical Exam: Gen: Laying in bed comfortably, NAD CVS: RRR, no m/r/g Resp: Normal WOB, on [place], Diffuse wheezes, coarse crackles in bases Abd: Soft, NTND, +BS Ext: 3+ LE edema b/l, venous stasis ulcers healing over L shin  Imaging: Nm Bone Scan Whole Body  09/26/2015  CLINICAL DATA:  Nephrotic syndrome with elevated kappa light chain EXAM:  NUCLEAR MEDICINE WHOLE BODY BONE SCAN TECHNIQUE: Whole body anterior and posterior images were obtained approximately 3 hours after intravenous injection of radiopharmaceutical. RADIOPHARMACEUTICALS:  25.4 mCi Technetium-943mDP IV COMPARISON:  None. FINDINGS: There is abnormal radiotracer uptake at the junction of the mid and distal thirds of the right femur. Increased uptake in both knees and shoulders most likely is of arthropathic etiology. Elsewhere, the distribution of radiotracer uptake is unremarkable. Scoliosis is noted in the lumbar spine. Flank positioned kidneys noted bilaterally. IMPRESSION: Focal increased uptake of radiotracer at the junction of the mid and distal thirds of the right femur. Etiology for this isolated lesion is uncertain. Radiographic correlation advised. Areas of presumed arthropathy in the knees and shoulders. Distribution of uptake otherwise unremarkable. It should be noted that the sensitivity for potential multiple myeloma is variable with respect to multiple myeloma. If there is strong clinical concern for multiple myeloma, metastatic bone survey may be advisable for further assessment. Electronically Signed   By: WiLowella GripII M.D.   On: 09/26/2015 07:16    Labs: BMET  Recent Labs Lab 09/20/15 1738 09/21/15 0600 09/22/15 0244 09/23/15 0222 09/24/15 0430 09/25/15 0328 09/26/15 0450  NA 143 142 143 142 142 141 140  K 3.4* 3.4* 3.3* 3.1* 3.5 3.9 4.0  CL 98* 99* 101 102 104 100* 104  CO2 31 33* 32 31  _0 GLUCOSE 108* 104* 136* 104* 111* 123* 104*  BUN 31* 31* 40* 38* 38* 38* 38*  CREATININE 1.46* 1.57* 1.94* 1.82* 1.80* 1.52* 1.57*  CALCIUM 8.0* 7.6* 7.1* 7.2* 7.7* 7.7* 7.5*   CBC  Recent Labs Lab 09/20/15 2000 09/24/15 0430 09/26/15 0450  WBC 7.9 8.5 4.8  NEUTROABS 6.7  --   --   HGB 10.0* 10.2* 9.1*  HCT 31.4* 33.1* 29.3*  MCV 87.7 86.6 87.2  PLT 202 180 185   C3 74 C4 3  Kappa free light chain 219.11 lamda free light chain  67.26  Hep C Ab >11  Medications:    . aspirin EC  81 mg Oral Daily  . enoxaparin (LOVENOX) injection  40 mg Subcutaneous Q24H  . fentaNYL      . furosemide  120 mg Intravenous 3 times per day  . guaiFENesin  1,200 mg Oral BID  . midazolam      . mometasone-formoterol  2 puff Inhalation BID  . nebivolol  5 mg Oral Daily  . potassium chloride  40 mEq Oral Daily  . sodium chloride flush  3 mL Intravenous Q12H     Virginia Crews, MD, MPH PGY-2,  [facility] 09/26/2015 8:02 AM    Renal Attending: Interesting mix of immunologic concerns: hypocomplementemia, Hep C ab pos and elevated free light chains. Attempt biopsy in AM.  Davari Lopes C

## 2015-09-26 NOTE — Progress Notes (Signed)
[facility] PROGRESS NOTE  George [name] JSE:[id number] DOB: [birth year] DOA: 09/20/2015 PCP: [name], FNP  Assessment/Plan:  Anasarca/Proteinuria/Hypoalbuminemia -likely has nephrotic syndrome or GN given proteinuria/hematuria -urine Protein/creat ratio >3 -ECHO without CHF per Cards, Liver US without signs of Cirrhosis -Finally starting to diurese, continue high dose IV lasix per Renal -Kappa free light chains elevated, patient does not have gamma gap, bone scan negative -Renal biopsy to be done in a.m. (postponed from today was patient received Lovenox.) -Check BMP in a.m.  Hypokalemia -Likely secondary to diuresis, this is replaced with oral supplements.  Hyperglycemia -no h/o DM, A1c is 5.2  COPD -stable, nebs PRN  Leg ulcers -wound care  TSH slightly up but T4 WNL, consistent with Sick Euthyroid syndrome -doesn't need Synthroid, repeat in 3 months  ? HCAP -No fever, no chills or leukocytosis, Zosyn discontinued on 2/12  DVt proph: lovenox  Code Status: Full Code Family Communication: none at bedside Disposition Plan: Home when improved   Consultants:  HPI/Subjective: Feels a little better today  Objective: Filed Vitals:   09/26/15 0900 09/26/15 0956  BP: 126/80 140/77  Pulse: 79 75  Temp: 97.7 F (36.5 C) 97.5 F (36.4 C)  Resp: 20     Intake/Output Summary (Last 24 hours) at 09/26/15 1454 Last data filed at 09/26/15 1438  Gross per 24 hour  Intake    906 ml  Output   2650 ml  Net  -1744 ml   Filed Weights   09/24/15 0415 09/25/15 0611 09/26/15 0459  Weight: 109.408 kg (241 lb 3.2 oz) 106.232 kg (234 lb 3.2 oz) 107.321 kg (236 lb 9.6 oz)    Exam:   General:  AAOx3  Cardiovascular: S1S2/RRR  Respiratory: diminished BS at bases  Abdomen: soft, Nt, BS present  Musculoskeletal: 3plus edema, chronic ulcers   Data Reviewed: Basic Metabolic Panel:  Recent Labs Lab 09/20/15 1738  09/22/15 0244 09/23/15 0222  09/24/15 0430 09/25/15 0328 09/26/15 0450  NA 143  < > 143 142 142 141 140  K 3.4*  < > 3.3* 3.1* 3.5 3.9 4.0  CL 98*  < > 101 102 104 100* 104  CO2 31  < > 32 31 30 31 29   GLUCOSE 108*  < > 136* 104* 111* 123* 104*  BUN 31*  < > 40* 38* 38* 38* 38*  CREATININE 1.46*  < > 1.94* 1.82* 1.80* 1.52* 1.57*  CALCIUM 8.0*  < > 7.1* 7.2* 7.7* 7.7* 7.5*  MG 2.0  --   --   --   --   --   --   < > = values in this interval not displayed. Liver Function Tests:  Recent Labs Lab 09/20/15 1738  AST 40  ALT 17  ALKPHOS 56  BILITOT 0.8  PROT 4.4*  ALBUMIN 2.2*   No results for input(s): LIPASE, AMYLASE in the last 168 hours. No results for input(s): AMMONIA in the last 168 hours. CBC:  Recent Labs Lab 09/20/15 2000 09/24/15 0430 09/26/15 0450  WBC 7.9 8.5 4.8  NEUTROABS 6.7  --   --   HGB 10.0* 10.2* 9.1*  HCT 31.4* 33.1* 29.3*  MCV 87.7 86.6 87.2  PLT 202 180 185   Cardiac Enzymes:  Recent Labs Lab 09/20/15 1738 09/20/15 2314 09/21/15 0600  TROPONINI 0.15* 0.15* 0.15*   BNP (last 3 results)  Recent Labs  08/22/15 0101 09/20/15 1738  BNP 465.9* 980.5*    ProBNP (last 3 results)  Recent Labs  08/29/15  Hand 894.0*    CBG: No results for input(s): GLUCAP in the last 168 hours.  Recent Results (from the past 240 hour(s))  Culture, respiratory (NON-Expectorated)     Status: None   Collection Time: 09/20/15  5:10 AM  Result Value Ref Range Status   Specimen Description EXPECTORATED SPUTUM  Final   Special Requests NONE  Final   Gram Stain   Final    ABUNDANT WBC PRESENT,BOTH PMN AND MONONUCLEAR FEW SQUAMOUS EPITHELIAL CELLS PRESENT MODERATE GRAM POSITIVE COCCI IN PAIRS FEW GRAM POSITIVE RODS Performed at [facility]    Culture   Final    NORMAL OROPHARYNGEAL FLORA Performed at [facility]    Report Status 09/23/2015 FINAL  Final  Culture, expectorated sputum-assessment     Status: None   Collection Time: 09/20/15  9:31 AM   Result Value Ref Range Status   Specimen Description EXPECTORATED SPUTUM  Final   Special Requests Normal  Final   Sputum evaluation   Final    THIS SPECIMEN IS ACCEPTABLE. RESPIRATORY CULTURE REPORT TO FOLLOW.   Report Status 09/21/2015 FINAL  Final  Culture, blood (Routine X 2) w Reflex to ID Panel     Status: None   Collection Time: 09/20/15  5:42 PM  Result Value Ref Range Status   Specimen Description BLOOD RIGHT ANTECUBITAL  Final   Special Requests BOTTLES DRAWN AEROBIC AND ANAEROBIC 5CC  Final   Culture NO GROWTH 5 DAYS  Final   Report Status 09/25/2015 FINAL  Final  Culture, blood (Routine X 2) w Reflex to ID Panel     Status: None   Collection Time: 09/20/15  5:46 PM  Result Value Ref Range Status   Specimen Description BLOOD LEFT ANTECUBITAL  Final   Special Requests BOTTLES DRAWN AEROBIC AND ANAEROBIC 5CC  Final   Culture NO GROWTH 5 DAYS  Final   Report Status 09/25/2015 FINAL  Final     Studies: Nm Bone Scan Whole Body  09/26/2015  CLINICAL DATA:  Nephrotic syndrome with elevated kappa light chain EXAM: NUCLEAR MEDICINE WHOLE BODY BONE SCAN TECHNIQUE: Whole body anterior and posterior images were obtained approximately 3 hours after intravenous injection of radiopharmaceutical. RADIOPHARMACEUTICALS:  25.4 mCi Technetium-16mMDP IV COMPARISON:  None. FINDINGS: There is abnormal radiotracer uptake at the junction of the mid and distal thirds of the right femur. Increased uptake in both knees and shoulders most likely is of arthropathic etiology. Elsewhere, the distribution of radiotracer uptake is unremarkable. Scoliosis is noted in the lumbar spine. Flank positioned kidneys noted bilaterally. IMPRESSION: Focal increased uptake of radiotracer at the junction of the mid and distal thirds of the right femur. Etiology for this isolated lesion is uncertain. Radiographic correlation advised. Areas of presumed arthropathy in the knees and shoulders. Distribution of uptake otherwise  unremarkable. It should be noted that the sensitivity for potential multiple myeloma is variable with respect to multiple myeloma. If there is strong clinical concern for multiple myeloma, metastatic bone survey may be advisable for further assessment. Electronically Signed   By: WLowella GripIII M.D.   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 Frontal view bilateral shoulders demonstrates no lytic or blastic lesion. Frontal view bilateral humerus shows no lytic or blastic lesions. Frontal view bilateral forearm shows  no lytic or blastic lesions. Frontal and lateral view of cervical spine shows no lytic or blastic lesion. Alignment and vertebral body heights are preserved. Minimal anterior spurring lower endplate of C5 and C6 vertebral body. No prevertebral soft tissue swelling. Frontal and lateral view of thoracic spine shows alignment preserved. No lytic or blastic lesion. Frontal and lateral view of lumbar spine shows no lytic or blastic lesion. Mild dextroscoliosis lumbar spine. Frontal view of the chest shows no rib lesions. Mild interstitial prominence bilateral suspicious for mild interstitial edema. Frontal view of the pelvis shows no lytic or blastic lesions. Intra medullary rod is noted in proximal right femur. Four views bilateral femur submitted. No lytic or blastic lesion. Mild degenerative changes bilateral knee joints. There is narrowing of medial joint space terminal knee joint. There is old fracture deformity midshaft of the right femur. Question of fracture of the left patella. IMPRESSION: 1. No lytic or blastic lesions are not identified. No acute fracture or subluxation. Degenerative changes as described above. Intra medullary rod and old fracture deformity noted right femur.  Electronically Signed   By: Lahoma Crocker M.D.   On: 09/26/2015 13:14    Scheduled Meds: . aspirin EC  81 mg Oral Daily  . [START ON 09/27/2015] enoxaparin (LOVENOX) injection  40 mg Subcutaneous Q24H  . furosemide  120 mg Intravenous 3 times per day  . guaiFENesin  1,200 mg Oral BID  . lidocaine (PF)      . mometasone-formoterol  2 puff Inhalation BID  . nebivolol  5 mg Oral Daily  . potassium chloride  40 mEq Oral Daily  . sodium chloride flush  3 mL Intravenous Q12H   Continuous Infusions:  Antibiotics Given (last 72 hours)    None      Principal Problem:   Acute respiratory failure (HCC) Active Problems:   Essential hypertension   COPD exacerbation (HCC)   Acute on chronic systolic CHF (congestive heart failure) (HCC)   AVNRT (AV nodal re-entry tachycardia) (HCC)   Venous insufficiency of both lower extremities   Acute renal insufficiency   Hypokalemia   Hypothyroidism   Acute renal failure (ARF) (HCC)   AAA (abdominal aortic aneurysm) (HCC)   Elevated troponin    Time spent: [facility] Pager [phone]. If 7PM-7AM, please contact night-coverage at [email or web address], password [facility] 09/26/2015, 2:54 PM  LOS: 6 days

## 2015-09-26 NOTE — Progress Notes (Signed)
Patient ID: George [name], male   DOB: 12/26/1950, 65 y.o.   MRN: [id number]   Random renal biopsy rescheduled to 2/15  Pt was given Lovenox 2/14 5:19 pm- regardless of HOLD order in computer  Will reorder HOLD Lovenox Injection 2/15 pm for Rad procedure 2/16  RN aware

## 2015-09-27 ENCOUNTER — Encounter (HOSPITAL_COMMUNITY): Payer: Self-pay | Admitting: General Practice

## 2015-09-27 ENCOUNTER — Inpatient Hospital Stay (HOSPITAL_COMMUNITY): Payer: Medicare Other

## 2015-09-27 LAB — BASIC METABOLIC PANEL
Anion gap: 8 (ref 5–15)
BUN: 35 mg/dL — AB (ref 6–20)
CHLORIDE: 104 mmol/L (ref 101–111)
CO2: 29 mmol/L (ref 22–32)
CREATININE: 1.47 mg/dL — AB (ref 0.61–1.24)
Calcium: 8.2 mg/dL — ABNORMAL LOW (ref 8.9–10.3)
GFR calc Af Amer: 56 mL/min — ABNORMAL LOW (ref >60)
GFR calc non Af Amer: 48 mL/min — ABNORMAL LOW (ref >60)
GLUCOSE: 114 mg/dL — AB (ref 65–99)
POTASSIUM: 4.9 mmol/L (ref 3.5–5.1)
Sodium: 141 mmol/L (ref 135–145)

## 2015-09-27 MED ORDER — LIDOCAINE HCL (PF) 1 % IJ SOLN
INTRAMUSCULAR | Status: AC
  Filled 2015-09-27: qty 10

## 2015-09-27 MED ORDER — FENTANYL CITRATE (PF) 100 MCG/2ML IJ SOLN
INTRAMUSCULAR | Status: AC
  Filled 2015-09-27: qty 2

## 2015-09-27 MED ORDER — MIDAZOLAM HCL 2 MG/2ML IJ SOLN
INTRAMUSCULAR | Status: AC
  Filled 2015-09-27: qty 2

## 2015-09-27 MED ORDER — FUROSEMIDE 80 MG PO TABS
160.0000 mg | ORAL_TABLET | Freq: Two times a day (BID) | ORAL | Status: DC
  Administered 2015-09-27 – 2015-09-30 (×5): 160 mg via ORAL
  Filled 2015-09-27 (×5): qty 2

## 2015-09-27 MED ORDER — MIDAZOLAM HCL 2 MG/2ML IJ SOLN
INTRAMUSCULAR | Status: AC | PRN
  Administered 2015-09-27: 1 mg via INTRAVENOUS

## 2015-09-27 MED ORDER — FENTANYL CITRATE (PF) 100 MCG/2ML IJ SOLN
INTRAMUSCULAR | Status: AC | PRN
  Administered 2015-09-27: 25 ug via INTRAVENOUS

## 2015-09-27 NOTE — Progress Notes (Signed)
Assessment/ Plan: Glomerulonephritis  Edema, proteinuria, AKI:  Creat 1.94 > 1.82 >1.8 >1.52 > 1.57>1.47 this AM - UP:C 3.09, ANA neg, ANCA neg, anti-GBM neg - pos Hep C Ab - RNA quant pending - Depressed complement levels and elevated free light chains - no M spike on SPEP, UPEP normal Plan: Begin PO furosemide, DC IV.  Will get Biopsy results back in a week     Subjective: Interval History: Biopsy done this AM, lying on back  Objective: Vital signs in last 24 hours: Temp:  [97.8 F (36.6 C)-98.7 F (37.1 C)] 98 F (36.7 C) (02/16 0636) Pulse Rate:  [74-98] 74 (02/16 1035) Resp:  [18-28] 22 (02/16 1035) BP: (117-142)/(61-82) 141/72 mmHg (02/16 1035) SpO2:  [93 %-99 %] 97 % (02/16 1059) Weight:  [103.193 kg (227 lb 8 oz)] 103.193 kg (227 lb 8 oz) (02/16 0636) Weight change: -4.128 kg (-9 lb 1.6 oz)  Intake/Output from previous day: 02/15 0701 - 02/16 0700 In: 844 [P.O.:720; IV Piggyback:124] Out: 4425 [Urine:4425] Intake/Output this shift: Total I/O In: 0  Out: 1200 [Urine:1200]  Cardio: regular rate and rhythm, S1, S2 normal, no murmur, click, rub or gallop GI: soft, non-tender; bowel sounds normal; no masses,  no organomegaly Extremities: edema less  Lab Results:  Recent Labs  09/26/15 0450  WBC 4.8  HGB 9.1*  HCT 29.3*  PLT 185   BMET:  Recent Labs  09/26/15 0450 09/27/15 0500  NA 140 141  K 4.0 4.9  CL 104 104  CO2 29 29  GLUCOSE 104* 114*  BUN 38* 35*  CREATININE 1.57* 1.47*  CALCIUM 7.5* 8.2*   No results for input(s): PTH in the last 72 hours. Iron Studies: No results for input(s): IRON, TIBC, TRANSFERRIN, FERRITIN in the last 72 hours. Studies/Results: Nm Bone Scan Whole Body  09/26/2015  CLINICAL DATA:  Nephrotic syndrome with elevated kappa light chain EXAM: NUCLEAR MEDICINE WHOLE BODY BONE SCAN TECHNIQUE: Whole body anterior and posterior images were obtained approximately 3 hours after intravenous injection of radiopharmaceutical.  RADIOPHARMACEUTICALS:  25.4 mCi Technetium-69mMDP IV COMPARISON:  None. FINDINGS: There is abnormal radiotracer uptake at the junction of the mid and distal thirds of the right femur. Increased uptake in both knees and shoulders most likely is of arthropathic etiology. Elsewhere, the distribution of radiotracer uptake is unremarkable. Scoliosis is noted in the lumbar spine. Flank positioned kidneys noted bilaterally. IMPRESSION: Focal increased uptake of radiotracer at the junction of the mid and distal thirds of the right femur. Etiology for this isolated lesion is uncertain. Radiographic correlation advised. Areas of presumed arthropathy in the knees and shoulders. Distribution of uptake otherwise unremarkable. It should be noted that the sensitivity for potential multiple myeloma is variable with respect to multiple myeloma. If there is strong clinical concern for multiple myeloma, metastatic bone survey may be advisable for further assessment. Electronically Signed   By: WLowella GripIII M.D.   On: 09/26/2015 07:16   [place]  09/27/2015  INDICATION: 65year old with proteinuria and possible nephrotic syndrome. Request for renal biopsy. EXAM: ULTRASOUND-GUIDED LEFT KIDNEY BIOPSY MEDICATIONS: None. ANESTHESIA/SEDATION: Fentanyl 25 mcg IV; Versed 1.0 mg IV Moderate Sedation Time:  10 minutes The patient was continuously monitored during the procedure by the interventional radiology nurse under my direct supervision. FLUOROSCOPY TIME:  None COMPLICATIONS: None immediate. PROCEDURE: Informed written consent was obtained from the patient after a thorough discussion of the procedural risks, benefits and alternatives. All questions were addressed. A timeout was performed prior  to the initiation of the procedure. Ultrasound was used to evaluate both kidneys. The left kidney was selected for biopsy. The left flank was prepped with chlorhexidine. The left flank was prepped and draped in a sterile fashion. Skin  was anesthetized with 1% lidocaine. A 16 gauge core needle was directed towards the left kidney lower pole with ultrasound guidance. Two core biopsies were obtained within the lower pole cortex. Specimens were placed in saline. Bandage placed over the puncture site. FINDINGS: Normal appearance of the left kidney without hydronephrosis. Core biopsy needle was directed into the lower pole cortex with ultrasound guidance. No significant bleeding or hematoma formation following the core biopsies. IMPRESSION: Successful ultrasound-guided core biopsy of left kidney. Electronically Signed   By: Markus Daft M.D.   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 Frontal view bilateral shoulders demonstrates no lytic or blastic lesion. Frontal view bilateral humerus shows no lytic or blastic lesions. Frontal view bilateral forearm shows no lytic or blastic lesions. Frontal and lateral view of cervical spine shows no lytic or blastic lesion. Alignment and vertebral body heights are preserved. Minimal anterior spurring lower endplate of C5 and C6 vertebral body. No prevertebral soft tissue swelling. Frontal and lateral view of thoracic spine shows alignment preserved. No lytic or blastic lesion. Frontal and lateral view of lumbar spine shows no lytic or blastic lesion. Mild dextroscoliosis lumbar spine. Frontal view of the chest shows no rib lesions. Mild interstitial prominence bilateral suspicious for mild interstitial edema. Frontal view of the pelvis shows no lytic or blastic lesions. Intra medullary rod is noted in proximal right femur. Four views bilateral femur submitted. No lytic or blastic lesion. Mild degenerative changes bilateral knee joints. There is narrowing of medial joint space terminal knee joint. There is old  fracture deformity midshaft of the right femur. Question of fracture of the left patella. IMPRESSION: 1. No lytic or blastic lesions are not identified. No acute fracture or subluxation. Degenerative changes as described above. Intra medullary rod and old fracture deformity noted right femur. Electronically Signed   By: Lahoma Crocker M.D.   On: 09/26/2015 13:14    Scheduled: . aspirin EC  81 mg Oral Daily  . enoxaparin (LOVENOX) injection  40 mg Subcutaneous Q24H  . fentaNYL      . furosemide  160 mg Oral BID  . guaiFENesin  1,200 mg Oral BID  . lidocaine (PF)      . midazolam      . mometasone-formoterol  2 puff Inhalation BID  . nebivolol  5 mg Oral Daily  . sodium chloride flush  3 mL Intravenous Q12H    LOS: 7 days   Vadis Slabach C 09/27/2015,11:32 AM

## 2015-09-27 NOTE — Procedures (Signed)
US guided core biopsies of left kidney lower pole.  2 cores obtained.  No immediate complication.  See full report in PACS/Imaging.

## 2015-09-27 NOTE — Progress Notes (Signed)
[facility] PROGRESS NOTE  George [name] TML:[id number] DOB: [birth year] DOA: 09/20/2015 PCP: [name], FNP    HPI/Subjective: Feels a little better today  Assessment/Plan:  Anasarca/Proteinuria/Hypoalbuminemia -likely has nephrotic syndrome or GN given proteinuria/hematuria -Urine Protein/creat ratio >3 -ECHO without CHF per Cards, Liver US without signs of Cirrhosis -Finally starting to diurese, continue high dose IV lasix per Renal -Kappa free light chains elevated, patient does not have gamma gap, bone scan negative -Renal biopsy done today, follow results. -Continue diuretics, appreciate nephrology's help. -Renal function is improving with diuresis creatinine is 1.47.  Hypokalemia -Likely secondary to diuresis, this is replaced with oral supplements.  Hyperglycemia -no h/o DM, A1c is 5.2  COPD -stable, nebs PRN  Leg ulcers -wound care  TSH slightly up but T4 WNL, consistent with Sick Euthyroid syndrome -doesn't need Synthroid, repeat in 3 months  ? HCAP -No fever, no chills or leukocytosis, Zosyn discontinued on 2/12  DVt proph: lovenox  Code Status: Full Code Family Communication: none at bedside Disposition Plan: Home when improved   Consultants:   Objective: Filed Vitals:   09/27/15 1035 09/27/15 1100  BP: 141/72 131/78  Pulse: 74 85  Temp:  98.2 F (36.8 C)  Resp: 22 20    Intake/Output Summary (Last 24 hours) at 09/27/15 1244 Last data filed at 09/27/15 1035  Gross per 24 hour  Intake    484 ml  Output   4800 ml  Net  -4316 ml   Filed Weights   09/25/15 0611 09/26/15 0459 09/27/15 0636  Weight: 106.232 kg (234 lb 3.2 oz) 107.321 kg (236 lb 9.6 oz) 103.193 kg (227 lb 8 oz)    Exam:   General:  AAOx3  Cardiovascular: S1S2/RRR  Respiratory: diminished BS at bases  Abdomen: soft, Nt, BS present  Musculoskeletal: 3plus edema, chronic ulcers   Data Reviewed: Basic Metabolic Panel:  Recent Labs Lab 09/20/15 1738   09/23/15 0222 09/24/15 0430 09/25/15 0328 09/26/15 0450 09/27/15 0500  NA 143  < > 142 142 141 140 141  K 3.4*  < > 3.1* 3.5 3.9 4.0 4.9  CL 98*  < > 102 104 100* 104 104  CO2 31  < > 31 30 31 29 29   GLUCOSE 108*  < > 104* 111* 123* 104* 114*  BUN 31*  < > 38* 38* 38* 38* 35*  CREATININE 1.46*  < > 1.82* 1.80* 1.52* 1.57* 1.47*  CALCIUM 8.0*  < > 7.2* 7.7* 7.7* 7.5* 8.2*  MG 2.0  --   --   --   --   --   --   < > = values in this interval not displayed. Liver Function Tests:  Recent Labs Lab 09/20/15 1738  AST 40  ALT 17  ALKPHOS 56  BILITOT 0.8  PROT 4.4*  ALBUMIN 2.2*   No results for input(s): LIPASE, AMYLASE in the last 168 hours. No results for input(s): AMMONIA in the last 168 hours. CBC:  Recent Labs Lab 09/20/15 2000 09/24/15 0430 09/26/15 0450  WBC 7.9 8.5 4.8  NEUTROABS 6.7  --   --   HGB 10.0* 10.2* 9.1*  HCT 31.4* 33.1* 29.3*  MCV 87.7 86.6 87.2  PLT 202 180 185   Cardiac Enzymes:  Recent Labs Lab 09/20/15 1738 09/20/15 2314 09/21/15 0600  TROPONINI 0.15* 0.15* 0.15*   BNP (last 3 results)  Recent Labs  08/22/15 0101 09/20/15 1738  BNP 465.9* 980.5*    ProBNP (last 3 results)  Recent Labs  08/29/15 1554  PROBNP 894.0*    CBG: No results for input(s): GLUCAP in the last 168 hours.  Recent Results (from the past 240 hour(s))  Culture, respiratory (NON-Expectorated)     Status: None   Collection Time: 09/20/15  5:10 AM  Result Value Ref Range Status   Specimen Description EXPECTORATED SPUTUM  Final   Special Requests NONE  Final   Gram Stain   Final    ABUNDANT WBC PRESENT,BOTH PMN AND MONONUCLEAR FEW SQUAMOUS EPITHELIAL CELLS PRESENT MODERATE GRAM POSITIVE COCCI IN PAIRS FEW GRAM POSITIVE RODS Performed at [facility]    Culture   Final    NORMAL OROPHARYNGEAL FLORA Performed at [facility]    Report Status 09/23/2015 FINAL  Final  Culture, expectorated sputum-assessment     Status: None    Collection Time: 09/20/15  9:31 AM  Result Value Ref Range Status   Specimen Description EXPECTORATED SPUTUM  Final   Special Requests Normal  Final   Sputum evaluation   Final    THIS SPECIMEN IS ACCEPTABLE. RESPIRATORY CULTURE REPORT TO FOLLOW.   Report Status 09/21/2015 FINAL  Final  Culture, blood (Routine X 2) w Reflex to ID Panel     Status: None   Collection Time: 09/20/15  5:42 PM  Result Value Ref Range Status   Specimen Description BLOOD RIGHT ANTECUBITAL  Final   Special Requests BOTTLES DRAWN AEROBIC AND ANAEROBIC 5CC  Final   Culture NO GROWTH 5 DAYS  Final   Report Status 09/25/2015 FINAL  Final  Culture, blood (Routine X 2) w Reflex to ID Panel     Status: None   Collection Time: 09/20/15  5:46 PM  Result Value Ref Range Status   Specimen Description BLOOD LEFT ANTECUBITAL  Final   Special Requests BOTTLES DRAWN AEROBIC AND ANAEROBIC 5CC  Final   Culture NO GROWTH 5 DAYS  Final   Report Status 09/25/2015 FINAL  Final     Studies: Nm Bone Scan Whole Body  09/26/2015  CLINICAL DATA:  Nephrotic syndrome with elevated kappa light chain EXAM: NUCLEAR MEDICINE WHOLE BODY BONE SCAN TECHNIQUE: Whole body anterior and posterior images were obtained approximately 3 hours after intravenous injection of radiopharmaceutical. RADIOPHARMACEUTICALS:  25.4 mCi Technetium-30mMDP IV COMPARISON:  None. FINDINGS: There is abnormal radiotracer uptake at the junction of the mid and distal thirds of the right femur. Increased uptake in both knees and shoulders most likely is of arthropathic etiology. Elsewhere, the distribution of radiotracer uptake is unremarkable. Scoliosis is noted in the lumbar spine. Flank positioned kidneys noted bilaterally. IMPRESSION: Focal increased uptake of radiotracer at the junction of the mid and distal thirds of the right femur. Etiology for this isolated lesion is uncertain. Radiographic correlation advised. Areas of presumed arthropathy in the knees and shoulders.  Distribution of uptake otherwise unremarkable. It should be noted that the sensitivity for potential multiple myeloma is variable with respect to multiple myeloma. If there is strong clinical concern for multiple myeloma, metastatic bone survey may be advisable for further assessment. Electronically Signed   By: WLowella GripIII M.D.   On: 09/26/2015 07:16   [place]  09/27/2015  INDICATION: 65year old with proteinuria and possible nephrotic syndrome. Request for renal biopsy. EXAM: ULTRASOUND-GUIDED LEFT KIDNEY BIOPSY MEDICATIONS: None. ANESTHESIA/SEDATION: Fentanyl 25 mcg IV; Versed 1.0 mg IV Moderate Sedation Time:  10 minutes The patient was continuously monitored during the procedure by the interventional radiology nurse under my direct supervision. FLUOROSCOPY TIME:  None  COMPLICATIONS: None immediate. PROCEDURE: Informed written consent was obtained from the patient after a thorough discussion of the procedural risks, benefits and alternatives. All questions were addressed. A timeout was performed prior to the initiation of the procedure. Ultrasound was used to evaluate both kidneys. The left kidney was selected for biopsy. The left flank was prepped with chlorhexidine. The left flank was prepped and draped in a sterile fashion. Skin was anesthetized with 1% lidocaine. A 16 gauge core needle was directed towards the left kidney lower pole with ultrasound guidance. Two core biopsies were obtained within the lower pole cortex. Specimens were placed in saline. Bandage placed over the puncture site. FINDINGS: Normal appearance of the left kidney without hydronephrosis. Core biopsy needle was directed into the lower pole cortex with ultrasound guidance. No significant bleeding or hematoma formation following the core biopsies. IMPRESSION: Successful ultrasound-guided core biopsy of left kidney. Electronically Signed   By: Markus Daft M.D.   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 Frontal view bilateral shoulders demonstrates no lytic or blastic lesion. Frontal view bilateral humerus shows no lytic or blastic lesions. Frontal view bilateral forearm shows no lytic or blastic lesions. Frontal and lateral view of cervical spine shows no lytic or blastic lesion. Alignment and vertebral body heights are preserved. Minimal anterior spurring lower endplate of C5 and C6 vertebral body. No prevertebral soft tissue swelling. Frontal and lateral view of thoracic spine shows alignment preserved. No lytic or blastic lesion. Frontal and lateral view of lumbar spine shows no lytic or blastic lesion. Mild dextroscoliosis lumbar spine. Frontal view of the chest shows no rib lesions. Mild interstitial prominence bilateral suspicious for mild interstitial edema. Frontal view of the pelvis shows no lytic or blastic lesions. Intra medullary rod is noted in proximal right femur. Four views bilateral femur submitted. No lytic or blastic lesion. Mild degenerative changes bilateral knee joints. There is narrowing of medial joint space terminal knee joint. There is old fracture deformity midshaft of the right femur. Question of fracture of the left patella. IMPRESSION: 1. No lytic or blastic lesions are not identified. No acute fracture or subluxation. Degenerative changes as described above. Intra medullary rod and old fracture deformity noted right femur. Electronically Signed   By: Lahoma Crocker M.D.   On: 09/26/2015 13:14    Scheduled Meds: . aspirin EC  81 mg Oral Daily  . enoxaparin (LOVENOX) injection  40 mg Subcutaneous Q24H  . fentaNYL      . furosemide  160 mg Oral BID  . guaiFENesin  1,200 mg Oral BID  . lidocaine (PF)      . midazolam      . mometasone-formoterol  2 puff Inhalation BID  . nebivolol  5 mg  Oral Daily  . sodium chloride flush  3 mL Intravenous Q12H   Continuous Infusions:  Antibiotics Given (last 72 hours)    None      Principal Problem:   Acute respiratory failure (HCC) Active Problems:   Essential hypertension   COPD exacerbation (HCC)   Acute on chronic systolic CHF (congestive heart failure) (HCC)   AVNRT (AV nodal re-entry tachycardia) (HCC)   Venous insufficiency of both lower extremities   Acute renal insufficiency   Hypokalemia   Hypothyroidism   Acute renal failure (ARF) ([place])  AAA (abdominal aortic aneurysm) ([place])   Elevated troponin    Time spent: [facility] Pager [phone]. If 7PM-7AM, please contact night-coverage at [email or web address], password [facility] 09/27/2015, 12:44 PM  LOS: 7 days

## 2015-09-27 NOTE — Care Management Important Message (Signed)
Important Message  Patient Details  Name: George [name] MRN: [id number] Date of Birth: [birth year]   Medicare Important Message Given:  Yes    Rodderick Holtzer P Anjannette Gauger 09/27/2015, 12:03 PM

## 2015-09-28 LAB — BASIC METABOLIC PANEL
ANION GAP: 10 (ref 5–15)
BUN: 34 mg/dL — ABNORMAL HIGH (ref 6–20)
CALCIUM: 8 mg/dL — AB (ref 8.9–10.3)
CHLORIDE: 102 mmol/L (ref 101–111)
CO2: 29 mmol/L (ref 22–32)
Creatinine, Ser: 1.58 mg/dL — ABNORMAL HIGH (ref 0.61–1.24)
GFR calc Af Amer: 51 mL/min — ABNORMAL LOW (ref >60)
GFR calc non Af Amer: 44 mL/min — ABNORMAL LOW (ref >60)
GLUCOSE: 116 mg/dL — AB (ref 65–99)
POTASSIUM: 4.4 mmol/L (ref 3.5–5.1)
Sodium: 141 mmol/L (ref 135–145)

## 2015-09-28 LAB — HEPATITIS C GENOTYPE

## 2015-09-28 LAB — HCV RNA QUANT RFLX ULTRA OR GENOTYP
HCV RNA Qnt(log copy/mL): 6.146 log10 IU/mL
HEPATITIS C QUANTITATION: 1400000 [IU]/mL

## 2015-09-28 NOTE — Progress Notes (Signed)
I recommend Furosemide 160mg  BID at discharge. George [name] C

## 2015-09-28 NOTE — Progress Notes (Signed)
Assess: Glomerulonephritits and Nephrotic Syndrome   Plan:  I will follow up with renal biopsy as out pt.  Will sign off  Subjective: Interval History: Some back discomfort post bx  Objective: Vital signs in last 24 hours: Temp:  [98.2 F (36.8 C)-99.1 F (37.3 C)] 98.6 F (37 C) (02/17 0755) Pulse Rate:  [74-98] 92 (02/17 0755) Resp:  [20-28] 22 (02/17 0755) BP: (117-141)/(61-82) 140/71 mmHg (02/17 0755) SpO2:  [93 %-99 %] 93 % (02/17 0755) Weight:  [101.288 kg (223 lb 4.8 oz)] 101.288 kg (223 lb 4.8 oz) (02/17 0436) Weight change: -1.905 kg (-4 lb 3.2 oz)  Intake/Output from previous day: 02/16 0701 - 02/17 0700 In: 580 [P.O.:580] Out: 2125 [Urine:2125] Intake/Output this shift:    Extremities: edema 2+ LE edema  Lab Results:  Recent Labs  09/26/15 0450  WBC 4.8  HGB 9.1*  HCT 29.3*  PLT 185   BMET:  Recent Labs  09/27/15 0500 09/28/15 0420  NA 141 141  K 4.9 4.4  CL 104 102  CO2 29 29  GLUCOSE 114* 116*  BUN 35* 34*  CREATININE 1.47* 1.58*  CALCIUM 8.2* 8.0*   No results for input(s): PTH in the last 72 hours. Iron Studies: No results for input(s): IRON, TIBC, TRANSFERRIN, FERRITIN in the last 72 hours. Studies/Results: US Biopsy  09/27/2015  INDICATION: 65 year old with proteinuria and possible nephrotic syndrome. Request for renal biopsy. EXAM: ULTRASOUND-GUIDED LEFT KIDNEY BIOPSY MEDICATIONS: None. ANESTHESIA/SEDATION: Fentanyl 25 mcg IV; Versed 1.0 mg IV Moderate Sedation Time:  10 minutes The patient was continuously monitored during the procedure by the interventional radiology nurse under my direct supervision. FLUOROSCOPY TIME:  None COMPLICATIONS: None immediate. PROCEDURE: Informed written consent was obtained from the patient after a thorough discussion of the procedural risks, benefits and alternatives. All questions were addressed. A timeout was performed prior to the initiation of the procedure. Ultrasound was used to evaluate both kidneys.  The left kidney was selected for biopsy. The left flank was prepped with chlorhexidine. The left flank was prepped and draped in a sterile fashion. Skin was anesthetized with 1% lidocaine. A 16 gauge core needle was directed towards the left kidney lower pole with ultrasound guidance. Two core biopsies were obtained within the lower pole cortex. Specimens were placed in saline. Bandage placed over the puncture site. FINDINGS: Normal appearance of the left kidney without hydronephrosis. Core biopsy needle was directed into the lower pole cortex with ultrasound guidance. No significant bleeding or hematoma formation following the core biopsies. IMPRESSION: Successful ultrasound-guided core biopsy of left kidney. Electronically Signed   By: Markus Daft M.D.   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 Frontal view bilateral shoulders demonstrates no lytic or blastic lesion. Frontal view bilateral humerus shows no lytic or blastic lesions. Frontal view bilateral forearm shows no lytic or blastic lesions. Frontal and lateral view of cervical spine shows no lytic or blastic lesion. Alignment and vertebral body heights are preserved. Minimal anterior spurring lower endplate of C5 and C6 vertebral body. No prevertebral soft tissue swelling. Frontal and lateral view of thoracic spine shows alignment preserved. No lytic or blastic lesion. Frontal and lateral view of lumbar spine shows no lytic or blastic lesion. Mild dextroscoliosis lumbar spine. Frontal view of the chest shows no rib lesions. Mild  interstitial prominence bilateral suspicious for mild interstitial edema. Frontal view of the pelvis shows no lytic or blastic lesions. Intra medullary rod is noted in proximal right femur. Four views bilateral femur submitted.  No lytic or blastic lesion. Mild degenerative changes bilateral knee joints. There is narrowing of medial joint space terminal knee joint. There is old fracture deformity midshaft of the right femur. Question of fracture of the left patella. IMPRESSION: 1. No lytic or blastic lesions are not identified. No acute fracture or subluxation. Degenerative changes as described above. Intra medullary rod and old fracture deformity noted right femur. Electronically Signed   By: Lahoma Crocker M.D.   On: 09/26/2015 13:14   Scheduled: . aspirin EC  81 mg Oral Daily  . enoxaparin (LOVENOX) injection  40 mg Subcutaneous Q24H  . furosemide  160 mg Oral BID  . guaiFENesin  1,200 mg Oral BID  . mometasone-formoterol  2 puff Inhalation BID  . nebivolol  5 mg Oral Daily  . sodium chloride flush  3 mL Intravenous Q12H     LOS: 8 days   Monte Zinni C 09/28/2015,9:47 AM

## 2015-09-28 NOTE — Progress Notes (Signed)
[facility] PROGRESS NOTE  KEYWON MESTRE XOV:[id number] DOB: January 31, 1968 DOA: 09/20/2015 PCP: [name], FNP    HPI/Subjective: Negative feel well overnight, low-grade temp and back pain after the biopsy.  Assessment/Plan:  Anasarca/Proteinuria/Hypoalbuminemia -likely has nephrotic syndrome or GN given proteinuria/hematuria -Urine Protein/creat ratio >3 -ECHO without CHF per Cards, Liver US without signs of Cirrhosis -Finally starting to diurese, continue high dose IV lasix per Renal -Kappa free light chains elevated, patient does not have gamma gap, bone scan negative -Renal biopsy done today, follow results. -Continue diuretics, appreciate nephrology's help. -Renal function is stable with diuresis, my discharge later today or tomorrow morning depending on how he feels. -Nephrology recommended on 60 mg of Lasix twice a day.  Hypokalemia -Likely secondary to diuresis, this is replaced with oral supplements.  Hyperglycemia -no h/o DM, A1c is 5.2  COPD -stable, nebs PRN  Leg ulcers -wound care  TSH slightly up but T4 WNL, consistent with Sick Euthyroid syndrome -doesn't need Synthroid, repeat in 3 months  ? HCAP -No fever, no chills or leukocytosis, Zosyn discontinued on 2/12  DVt proph: lovenox  Code Status: Full Code Family Communication: none at bedside Disposition Plan: Home when improved   Consultants:   Objective: Filed Vitals:   09/28/15 1000 09/28/15 1155  BP: 108/61 115/62  Pulse: 101 87  Temp:  98.7 F (37.1 C)  Resp:      Intake/Output Summary (Last 24 hours) at 09/28/15 1203 Last data filed at 09/28/15 1030  Gross per 24 hour  Intake    820 ml  Output   1500 ml  Net   -680 ml   Filed Weights   09/26/15 0459 09/27/15 0636 09/28/15 0436  Weight: 107.321 kg (236 lb 9.6 oz) 103.193 kg (227 lb 8 oz) 101.288 kg (223 lb 4.8 oz)    Exam:   General:  AAOx3  Cardiovascular: S1S2/RRR  Respiratory: diminished BS at  bases  Abdomen: soft, Nt, BS present  Musculoskeletal: 3plus edema, chronic ulcers   Data Reviewed: Basic Metabolic Panel:  Recent Labs Lab 09/24/15 0430 09/25/15 0328 09/26/15 0450 09/27/15 0500 09/28/15 0420  NA 142 141 140 141 141  K 3.5 3.9 4.0 4.9 4.4  CL 104 100* 104 104 102  CO2 30 31 29 29 29   GLUCOSE 111* 123* 104* 114* 116*  BUN 38* 38* 38* 35* 34*  CREATININE 1.80* 1.52* 1.57* 1.47* 1.58*  CALCIUM 7.7* 7.7* 7.5* 8.2* 8.0*   Liver Function Tests: No results for input(s): AST, ALT, ALKPHOS, BILITOT, PROT, ALBUMIN in the last 168 hours. No results for input(s): LIPASE, AMYLASE in the last 168 hours. No results for input(s): AMMONIA in the last 168 hours. CBC:  Recent Labs Lab 09/24/15 0430 09/26/15 0450  WBC 8.5 4.8  HGB 10.2* 9.1*  HCT 33.1* 29.3*  MCV 86.6 87.2  PLT 180 185   Cardiac Enzymes: No results for input(s): CKTOTAL, CKMB, CKMBINDEX, TROPONINI in the last 168 hours. BNP (last 3 results)  Recent Labs  08/22/15 0101 09/20/15 1738  BNP 465.9* 980.5*    ProBNP (last 3 results)  Recent Labs  08/29/15 1554  PROBNP 894.0*    CBG: No results for input(s): GLUCAP in the last 168 hours.  Recent Results (from the past 240 hour(s))  Culture, respiratory (NON-Expectorated)     Status: None   Collection Time: 09/20/15  5:10 AM  Result Value Ref Range Status   Specimen Description EXPECTORATED SPUTUM  Final   Special Requests NONE  Final  Gram Stain   Final    ABUNDANT WBC PRESENT,BOTH PMN AND MONONUCLEAR FEW SQUAMOUS EPITHELIAL CELLS PRESENT MODERATE GRAM POSITIVE COCCI IN PAIRS FEW GRAM POSITIVE RODS Performed at [facility]    Culture   Final    NORMAL OROPHARYNGEAL FLORA Performed at [facility]    Report Status 09/23/2015 FINAL  Final  Culture, expectorated sputum-assessment     Status: None   Collection Time: 09/20/15  9:31 AM  Result Value Ref Range Status   Specimen Description EXPECTORATED SPUTUM   Final   Special Requests Normal  Final   Sputum evaluation   Final    THIS SPECIMEN IS ACCEPTABLE. RESPIRATORY CULTURE REPORT TO FOLLOW.   Report Status 09/21/2015 FINAL  Final  Culture, blood (Routine X 2) w Reflex to ID Panel     Status: None   Collection Time: 09/20/15  5:42 PM  Result Value Ref Range Status   Specimen Description BLOOD RIGHT ANTECUBITAL  Final   Special Requests BOTTLES DRAWN AEROBIC AND ANAEROBIC 5CC  Final   Culture NO GROWTH 5 DAYS  Final   Report Status 09/25/2015 FINAL  Final  Culture, blood (Routine X 2) w Reflex to ID Panel     Status: None   Collection Time: 09/20/15  5:46 PM  Result Value Ref Range Status   Specimen Description BLOOD LEFT ANTECUBITAL  Final   Special Requests BOTTLES DRAWN AEROBIC AND ANAEROBIC 5CC  Final   Culture NO GROWTH 5 DAYS  Final   Report Status 09/25/2015 FINAL  Final     Studies: US Biopsy  09/27/2015  INDICATION: 65 year old with proteinuria and possible nephrotic syndrome. Request for renal biopsy. EXAM: ULTRASOUND-GUIDED LEFT KIDNEY BIOPSY MEDICATIONS: None. ANESTHESIA/SEDATION: Fentanyl 25 mcg IV; Versed 1.0 mg IV Moderate Sedation Time:  10 minutes The patient was continuously monitored during the procedure by the interventional radiology nurse under my direct supervision. FLUOROSCOPY TIME:  None COMPLICATIONS: None immediate. PROCEDURE: Informed written consent was obtained from the patient after a thorough discussion of the procedural risks, benefits and alternatives. All questions were addressed. A timeout was performed prior to the initiation of the procedure. Ultrasound was used to evaluate both kidneys. The left kidney was selected for biopsy. The left flank was prepped with chlorhexidine. The left flank was prepped and draped in a sterile fashion. Skin was anesthetized with 1% lidocaine. A 16 gauge core needle was directed towards the left kidney lower pole with ultrasound guidance. Two core biopsies were obtained within the  lower pole cortex. Specimens were placed in saline. Bandage placed over the puncture site. FINDINGS: Normal appearance of the left kidney without hydronephrosis. Core biopsy needle was directed into the lower pole cortex with ultrasound guidance. No significant bleeding or hematoma formation following the core biopsies. IMPRESSION: Successful ultrasound-guided core biopsy of left kidney. Electronically Signed   By: Markus Daft M.D.   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 Frontal view bilateral shoulders demonstrates no lytic or blastic lesion. Frontal view bilateral humerus shows no lytic or blastic lesions. Frontal view bilateral forearm shows no lytic or blastic lesions. Frontal and lateral view of cervical spine shows no lytic or blastic lesion. Alignment and vertebral body heights are preserved. Minimal anterior spurring lower endplate of C5 and C6 vertebral body. No prevertebral  soft tissue swelling. Frontal and lateral view of thoracic spine shows alignment preserved. No lytic or blastic lesion. Frontal and lateral view of lumbar spine shows no lytic or blastic lesion. Mild dextroscoliosis lumbar spine. Frontal view of the chest shows no rib lesions. Mild interstitial prominence bilateral suspicious for mild interstitial edema. Frontal view of the pelvis shows no lytic or blastic lesions. Intra medullary rod is noted in proximal right femur. Four views bilateral femur submitted. No lytic or blastic lesion. Mild degenerative changes bilateral knee joints. There is narrowing of medial joint space terminal knee joint. There is old fracture deformity midshaft of the right femur. Question of fracture of the left patella. IMPRESSION: 1. No lytic or blastic lesions are not identified. No acute fracture or  subluxation. Degenerative changes as described above. Intra medullary rod and old fracture deformity noted right femur. Electronically Signed   By: Lahoma Crocker M.D.   On: 09/26/2015 13:14    Scheduled Meds: . aspirin EC  81 mg Oral Daily  . enoxaparin (LOVENOX) injection  40 mg Subcutaneous Q24H  . furosemide  160 mg Oral BID  . guaiFENesin  1,200 mg Oral BID  . mometasone-formoterol  2 puff Inhalation BID  . nebivolol  5 mg Oral Daily  . sodium chloride flush  3 mL Intravenous Q12H   Continuous Infusions:  Antibiotics Given (last 72 hours)    None      Principal Problem:   Acute respiratory failure (HCC) Active Problems:   Essential hypertension   COPD exacerbation (HCC)   Acute on chronic systolic CHF (congestive heart failure) (HCC)   AVNRT (AV nodal re-entry tachycardia) (HCC)   Venous insufficiency of both lower extremities   Acute renal insufficiency   Hypokalemia   Hypothyroidism   Acute renal failure (ARF) (HCC)   AAA (abdominal aortic aneurysm) (HCC)   Elevated troponin    Time spent: [facility] Pager [phone]. If 7PM-7AM, please contact night-coverage at [email or web address], password [facility] 09/28/2015, 12:03 PM  LOS: 8 days

## 2015-09-29 LAB — BASIC METABOLIC PANEL
ANION GAP: 12 (ref 5–15)
BUN: 35 mg/dL — ABNORMAL HIGH (ref 6–20)
CHLORIDE: 100 mmol/L — AB (ref 101–111)
CO2: 28 mmol/L (ref 22–32)
CREATININE: 1.53 mg/dL — AB (ref 0.61–1.24)
Calcium: 8.1 mg/dL — ABNORMAL LOW (ref 8.9–10.3)
GFR calc non Af Amer: 46 mL/min — ABNORMAL LOW (ref >60)
GFR, EST AFRICAN AMERICAN: 53 mL/min — AB (ref >60)
Glucose, Bld: 108 mg/dL — ABNORMAL HIGH (ref 65–99)
POTASSIUM: 4.3 mmol/L (ref 3.5–5.1)
SODIUM: 140 mmol/L (ref 135–145)

## 2015-09-29 NOTE — Evaluation (Signed)
Physical Therapy Evaluation Patient Details Name: George [name] MRN: [id number] DOB: 02-May-1951 Today's Date: 09/29/2015   History of Present Illness  65 y.o. male with a hx of PSVT, COPD, systolic CHF, DCM, HTN, chronic LE extremity edema from venous insufficiency with associated venous stasis ulcers. Anasarca/Proteinuria/Hypoalbuminemia.Had kidney biopsy  Clinical Impression  Pt admitted with above diagnosis. Pt currently with functional limitations due to the deficits listed below (see PT Problem List). Pt was able to ambulate with RW with good safety overall.  At times, pt was unsteady but did self correct.  Spoke with pt about options such as home with HHPT with 24 hour supervision initially vs SNF.  Pt states he will line up family to stay with him and wants to go home. Aware he will need RW for safety.  Declining HHPT at this time.  O2 on RA >90% with activity.  Will follow acutely if pt does not d/c this weekend.   Pt will benefit from skilled PT to increase their independence and safety with mobility to allow discharge to the venue listed below.      Follow Up Recommendations Home health PT;Supervision/Assistance - 24 hour    Equipment Recommendations  Rolling walker with 5" wheels    Recommendations for Other Services       Precautions / Restrictions Precautions Precautions: Fall Restrictions Weight Bearing Restrictions: No      Mobility  Bed Mobility Overal bed mobility: Independent             General bed mobility comments: Took pt incr time but he was able to get to EOB given time.  Moves very slowly.   Transfers Overall transfer level: Needs assistance Equipment used: Rolling walker (2 wheeled) Transfers: Sit to/from Stand Sit to Stand: Min assist;From elevated surface         General transfer comment: Pt was able to stand from bed with a little assist to power up and bed raised.   Ambulation/Gait Ambulation/Gait assistance: Min guard;+2  safety/equipment Ambulation Distance (Feet): 200 Feet Assistive device: Rolling walker (2 wheeled) Gait Pattern/deviations: Step-through pattern;Decreased stride length;Leaning posteriorly   Gait velocity interpretation: Below normal speed for age/gender General Gait Details: Pt with general steadiness with RW but could not ambulate without device today.  Relies on UE support with posterior lean at times which pt could self correct.    Stairs            Wheelchair Mobility    Modified Rankin (Stroke Patients Only)       Balance Overall balance assessment: Needs assistance Sitting-balance support: No upper extremity supported;Feet supported Sitting balance-Leahy Scale: Good   Postural control: Posterior lean Standing balance support: Bilateral upper extremity supported;During functional activity Standing balance-Leahy Scale: Poor Standing balance comment: relies on UE support with posterior lean at times.  Stood at sink and cleaned bottom as the pad on bed was dirty with BM.  Pt was able to clean himself but was using UE on sink to support self.                               Pertinent Vitals/Pain Pain Assessment: Faces Faces Pain Scale: Hurts little more Pain Location: back Pain Descriptors / Indicators: Aching;Sore Pain Intervention(s): Limited activity within patient's tolerance;Monitored during session;Repositioned  O2 90% and > on RA with ambulation.      Home Living Family/patient expects to be discharged to:: Private residence Living Arrangements: Alone Available  Help at Discharge: Family;Available PRN/intermittently Type of Home: House Home Access: Stairs to enter Entrance Stairs-Rails: Right;Left;Can reach both Entrance Stairs-Number of Steps: 4 Home Layout: One level Home Equipment: None      Prior Function Level of Independence: Independent               Hand Dominance   Dominant Hand: Right    Extremity/Trunk Assessment   Upper  Extremity Assessment: Defer to OT evaluation           Lower Extremity Assessment: Generalized weakness      Cervical / Trunk Assessment: Normal  Communication   Communication: No difficulties  Cognition Arousal/Alertness: Awake/alert Behavior During Therapy: WFL for tasks assessed/performed Overall Cognitive Status: Within Functional Limits for tasks assessed                      General Comments      Exercises        Assessment/Plan    PT Assessment Patient needs continued PT services  PT Diagnosis Difficulty walking   PT Problem List Decreased activity tolerance;Decreased balance;Decreased mobility  PT Treatment Interventions DME instruction;Gait training;Stair training;Functional mobility training;Therapeutic activities;Therapeutic exercise;Balance training   PT Goals (Current goals can be found in the Care Plan section) Acute Rehab PT Goals Patient Stated Goal: to go home PT Goal Formulation: With patient Time For Goal Achievement: 10/06/15 Potential to Achieve Goals: Good    Frequency Min 3X/week   Barriers to discharge        Co-evaluation               End of Session Equipment Utilized During Treatment: Gait belt Activity Tolerance: Patient tolerated treatment well Patient left: in bed;with call bell/phone within reach Nurse Communication: Mobility status         Time: [id number] PT Time Calculation (min) (ACUTE ONLY): 16 min   Charges:   PT Evaluation $PT Eval Moderate Complexity: 1 Procedure     PT G CodesIrwin Brakeman F October 09, 2015, 9:47 AM [facility] [phone] (pager)

## 2015-09-29 NOTE — Progress Notes (Signed)
[facility] PROGRESS NOTE  George [name] [id number] DOB: 26-May-1951 DOA: 09/20/2015 PCP: [name], FNP    HPI/Subjective: Does not feel well overall, his blood pressure is low this morning and has very severe generalized weakness. Had 2 people to help him to get off of the toilet bowl.  Assessment/Plan:  Anasarca/Proteinuria/Hypoalbuminemia -likely has nephrotic syndrome or GN given proteinuria/hematuria -Urine Protein/creat ratio >3 -ECHO without CHF per Cards, Liver US without signs of Cirrhosis -Finally starting to diurese, continue high dose IV lasix per Renal -Kappa free light chains elevated, patient does not have gamma gap, bone scan negative -Renal biopsy done. -Nephrology recommended on 160 mg of Lasix twice a day.  Generalized weakness -Patient also does have marginal low blood pressure, her BP medications held. -No bruising in his flank, does not appear to have bleeding going on. -We will obtain hemoglobin.  Hypokalemia -Likely secondary to diuresis, this is resolved  Hyperglycemia -no h/o DM, A1c is 5.2  COPD -stable, nebs PRN  Leg ulcers -wound care  TSH slightly up but T4 WNL, consistent with Sick Euthyroid syndrome -doesn't need Synthroid, repeat in 3 months  ? HCAP -No fever, no chills or leukocytosis, Zosyn discontinued on 2/12  DVt proph: lovenox  Code Status: Full Code Family Communication: none at bedside Disposition Plan: Home when improved   Consultants:   Objective: Filed Vitals:   09/29/15 0530 09/29/15 1026  BP: 115/66 96/60  Pulse: 85   Temp: 98.7 F (37.1 C) 98.2 F (36.8 C)  Resp: 20 20    Intake/Output Summary (Last 24 hours) at 09/29/15 1130 Last data filed at 09/29/15 0900  Gross per 24 hour  Intake    700 ml  Output   1600 ml  Net   -900 ml   Filed Weights   09/27/15 0636 09/28/15 0436 09/29/15 0530  Weight: 103.193 kg (227 lb 8 oz) 101.288 kg (223 lb 4.8 oz) 100.109 kg (220 lb 11.2 oz)     Exam:   General:  AAOx3  Cardiovascular: S1S2/RRR  Respiratory: diminished BS at bases  Abdomen: soft, Nt, BS present  Musculoskeletal: 3plus edema, chronic ulcers   Data Reviewed: Basic Metabolic Panel:  Recent Labs Lab 09/25/15 0328 09/26/15 0450 09/27/15 0500 09/28/15 0420 09/29/15 0527  NA 141 140 141 141 140  K 3.9 4.0 4.9 4.4 4.3  CL 100* 104 104 102 100*  CO2 31 29 29 29 28   GLUCOSE 123* 104* 114* 116* 108*  BUN 38* 38* 35* 34* 35*  CREATININE 1.52* 1.57* 1.47* 1.58* 1.53*  CALCIUM 7.7* 7.5* 8.2* 8.0* 8.1*   Liver Function Tests: No results for input(s): AST, ALT, ALKPHOS, BILITOT, PROT, ALBUMIN in the last 168 hours. No results for input(s): LIPASE, AMYLASE in the last 168 hours. No results for input(s): AMMONIA in the last 168 hours. CBC:  Recent Labs Lab 09/24/15 0430 09/26/15 0450  WBC 8.5 4.8  HGB 10.2* 9.1*  HCT 33.1* 29.3*  MCV 86.6 87.2  PLT 180 185   Cardiac Enzymes: No results for input(s): CKTOTAL, CKMB, CKMBINDEX, TROPONINI in the last 168 hours. BNP (last 3 results)  Recent Labs  08/22/15 0101 09/20/15 1738  BNP 465.9* 980.5*    ProBNP (last 3 results)  Recent Labs  08/29/15 1554  PROBNP 894.0*    CBG: No results for input(s): GLUCAP in the last 168 hours.  Recent Results (from the past 240 hour(s))  Culture, respiratory (NON-Expectorated)     Status: None   Collection Time: 09/20/15  5:10 AM  Result Value Ref Range Status   Specimen Description EXPECTORATED SPUTUM  Final   Special Requests NONE  Final   Gram Stain   Final    ABUNDANT WBC PRESENT,BOTH PMN AND MONONUCLEAR FEW SQUAMOUS EPITHELIAL CELLS PRESENT MODERATE GRAM POSITIVE COCCI IN PAIRS FEW GRAM POSITIVE RODS Performed at [facility]    Culture   Final    NORMAL OROPHARYNGEAL FLORA Performed at [facility]    Report Status 09/23/2015 FINAL  Final  Culture, expectorated sputum-assessment     Status: None   Collection Time:  09/20/15  9:31 AM  Result Value Ref Range Status   Specimen Description EXPECTORATED SPUTUM  Final   Special Requests Normal  Final   Sputum evaluation   Final    THIS SPECIMEN IS ACCEPTABLE. RESPIRATORY CULTURE REPORT TO FOLLOW.   Report Status 09/21/2015 FINAL  Final  Culture, blood (Routine X 2) w Reflex to ID Panel     Status: None   Collection Time: 09/20/15  5:42 PM  Result Value Ref Range Status   Specimen Description BLOOD RIGHT ANTECUBITAL  Final   Special Requests BOTTLES DRAWN AEROBIC AND ANAEROBIC 5CC  Final   Culture NO GROWTH 5 DAYS  Final   Report Status 09/25/2015 FINAL  Final  Culture, blood (Routine X 2) w Reflex to ID Panel     Status: None   Collection Time: 09/20/15  5:46 PM  Result Value Ref Range Status   Specimen Description BLOOD LEFT ANTECUBITAL  Final   Special Requests BOTTLES DRAWN AEROBIC AND ANAEROBIC 5CC  Final   Culture NO GROWTH 5 DAYS  Final   Report Status 09/25/2015 FINAL  Final     Studies: No results found.  Scheduled Meds: . aspirin EC  81 mg Oral Daily  . enoxaparin (LOVENOX) injection  40 mg Subcutaneous Q24H  . furosemide  160 mg Oral BID  . guaiFENesin  1,200 mg Oral BID  . mometasone-formoterol  2 puff Inhalation BID  . nebivolol  5 mg Oral Daily  . sodium chloride flush  3 mL Intravenous Q12H   Continuous Infusions:  Antibiotics Given (last 72 hours)    None      Principal Problem:   Acute respiratory failure (HCC) Active Problems:   Essential hypertension   COPD exacerbation (HCC)   Acute on chronic systolic CHF (congestive heart failure) (HCC)   AVNRT (AV nodal re-entry tachycardia) (HCC)   Venous insufficiency of both lower extremities   Acute renal insufficiency   Hypokalemia   Hypothyroidism   Acute renal failure (ARF) (HCC)   AAA (abdominal aortic aneurysm) (HCC)   Elevated troponin    Time spent: [facility] Pager [phone]. If 7PM-7AM, please contact night-coverage at  [email or web address], password [facility] 09/29/2015, 11:30 AM  LOS: 9 days

## 2015-09-29 NOTE — Progress Notes (Signed)
BP 96/60 MD made aware. Hold lasix and BP medications.

## 2015-09-30 LAB — CBC
HCT: 29.5 % — ABNORMAL LOW (ref 39.0–52.0)
HEMOGLOBIN: 8.9 g/dL — AB (ref 13.0–17.0)
MCH: 25.9 pg — ABNORMAL LOW (ref 26.0–34.0)
MCHC: 30.2 g/dL (ref 30.0–36.0)
MCV: 86 fL (ref 78.0–100.0)
Platelets: 389 10*3/uL (ref 150–400)
RBC: 3.43 MIL/uL — AB (ref 4.22–5.81)
RDW: 16.1 % — ABNORMAL HIGH (ref 11.5–15.5)
WBC: 7.2 10*3/uL (ref 4.0–10.5)

## 2015-09-30 MED ORDER — FUROSEMIDE 80 MG PO TABS: 160.0000 mg | ORAL_TABLET | Freq: Two times a day (BID) | ORAL | Status: DC

## 2015-09-30 MED ORDER — NEBIVOLOL HCL 5 MG PO TABS: 5.0000 mg | ORAL_TABLET | Freq: Every day | ORAL | Status: DC

## 2015-09-30 NOTE — Care Management Note (Signed)
Case Management Note  Patient Details  Name: George [name] MRN: [id number] Date of Birth: 1951-07-07  Subjective/Objective:                  Leg Swelling Action/Plan: DISCHARGE PLANNING Expected Discharge Date:  09/29/15               Expected Discharge Plan:  [place]  In-House Referral:     Discharge planning Services  CM Consult  Post Acute Care Choice:    Choice offered to:  Patient  DME Arranged:  Walker rolling DME Agency:  [place]:  Patient Refused [facility] Agency:     Status of Service:  Completed, signed off  Medicare Important Message Given:  Yes Date Medicare IM Given:    Medicare IM give by:    Date Additional Medicare IM Given:    Additional Medicare Important Message give by:     If discussed at [place] of Stay Meetings, dates discussed:    Additional Comments: CM met with pt in room to politely refuses all Home health services but requests rolling walker.  CM encouraged pt to please call MD office if he finds he needs [facility] services when he gets home.  CM called [facility] DME rep, Merry Proud to please deliver the rolling walker to room prior to discharge.  No other CM needs were communicated. Dellie Catholic, RN 09/30/2015, 1:44 PM

## 2015-09-30 NOTE — Progress Notes (Signed)
Pt. Discharged home, given discharge instructions. Pt verbalized understanding. Taken out via wheelchair.

## 2015-09-30 NOTE — Discharge Summary (Signed)
Physician Discharge Summary  George [name] [id number] DOB: 03-13-1951 DOA: 09/20/2015  PCP: George Po, FNP  Admit date: 09/20/2015 Discharge date: 09/30/2015  Time spent: 40 minutes  Recommendations for Outpatient Follow-up:  1. Follow-up with Dr. Florene [name].   Discharge Diagnoses:  Principal Problem:   Acute respiratory failure ([place]) Active Problems:   Essential hypertension   COPD exacerbation (HCC)   Acute on chronic systolic CHF (congestive heart failure) (HCC)   AVNRT (AV nodal re-entry tachycardia) (HCC)   Venous insufficiency of both lower extremities   Acute renal insufficiency   Hypokalemia   Hypothyroidism   Acute renal failure (ARF) (HCC)   AAA (abdominal aortic aneurysm) (HCC)   Elevated troponin   Discharge Condition: Stable  Diet recommendation: heart healthy  Filed Weights   09/28/15 0436 09/29/15 0530 09/30/15 0523  Weight: 101.288 kg (223 lb 4.8 oz) 100.109 kg (220 lb 11.2 oz) 100.336 kg (221 lb 3.2 oz)    History of present illness:  George [name] is a 65 y.o. male with a hx of PSVT, COPD, systolic CHF, DCM, HTN, chronic LE extremity edema from venous insufficiency with assoc venous stasis ulcers. He was admitted in 10/16 with SVT and elevated Troponin levels. Myoview was low risk at that time. He was admitted 1/17 with AECOPD. He had recurrent SVT on the floor that converted with IV Adenosine. He was seen by EP and RFCA was recommended. Of note, echo at that time demonstrated EF 40-45% with diffuse HK. 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 Cristopher [name]. He required IV Lasix prior to DC due to worsening dyspnea. Metoprolol was DC'd.   He called in today with complaints of worsening edema. He is added on for evaluation. He notes worsening dyspnea and edema over the past week. He has gained 9 lbs in the last 2 days on his scales at home. He is short of breath at rest. He notes + orthopnea, + PND. Denies chest pain or syncope. He notes  cough with yellow sputum and occasional blood. Denies fevers. Denies melena, hematochezia, vomiting, diarrhea. He no longer smokes.   Hospital Course:   Anasarca/Proteinuria/Hypoalbuminemia -likely has nephrotic syndrome or GN given proteinuria/hematuria -Urine Protein/creat ratio >3 -ECHO without CHF per Cards, Liver US without signs of Cirrhosis -Finally starting to diurese, continue high dose IV lasix per Renal -Kappa free light chains elevated, patient does not have gamma gap, bone scan negative -Renal biopsy done, follow-up with nephrology.  Generalized weakness -Patient also does have marginal low blood pressure, her BP medications held. -No bruising in his flank, does not appear to have bleeding going on. -Hemoglobin is 8.9, stable hemoglobinarea  Hypokalemia -Likely secondary to diuresis, this is resolved  Hyperglycemia -no h/o DM, A1c is 5.2  COPD -stable, nebs PRN  Leg ulcers -wound care  TSH slightly up but T4 WNL, consistent with Sick Euthyroid syndrome -doesn't need Synthroid, repeat in 3 months  ? HCAP -No fever, no chills or leukocytosis, Zosyn discontinued on 2/12.   Procedures:  Left kidney biopsy done by Dr. Halford [name] on 09/27/2015.  Consultations:  None  Discharge Exam: Filed Vitals:   09/30/15 0523 09/30/15 1300  BP: 123/7 127/66  Pulse: 72 71  Temp: 98.3 F (36.8 C) 98.1 F (36.7 C)  Resp: 18 18   General: Alert and awake, oriented x3, not in any acute distress. HEENT: anicteric sclera, pupils reactive to light and accommodation, EOMI CVS: S1-S2 clear, no murmur rubs or gallops Chest: clear to auscultation  bilaterally, no wheezing, rales or rhonchi Abdomen: soft nontender, nondistended, normal bowel sounds, no organomegaly Extremities: no cyanosis, clubbing or edema noted bilaterally Neuro: Cranial nerves II-XII intact, no focal neurological deficits  Discharge Instructions   Discharge Instructions    Diet - low sodium heart healthy     Complete by:  As directed      Increase activity slowly    Complete by:  As directed           Current Discharge Medication List    START taking these medications   Details  nebivolol (BYSTOLIC) 5 MG tablet Take 1 tablet (5 mg total) by mouth daily. Qty: 30 tablet, Refills: 0      CONTINUE these medications which have CHANGED   Details  furosemide (LASIX) 80 MG tablet Take 2 tablets (160 mg total) by mouth 2 (two) times daily. Qty: 120 tablet, Refills: 0      CONTINUE these medications which have NOT CHANGED   Details  aspirin EC 81 MG EC tablet Take 1 tablet (81 mg total) by mouth daily.    levalbuterol (XOPENEX HFA) 45 MCG/ACT inhaler Inhale 1-2 puffs into the lungs every 4 (four) hours as needed for wheezing. Qty: 1 Inhaler, Refills: 12   Associated Diagnoses: COPD exacerbation (HCC)    zolpidem (AMBIEN) 5 MG tablet Take 1 tablet (5 mg total) by mouth at bedtime as needed for sleep. Qty: 15 tablet, Refills: 1    mometasone-formoterol (DULERA) 100-5 MCG/ACT AERO Inhale 2 puffs into the lungs 2 (two) times daily. Qty: 8.8 g, Refills: 6      STOP taking these medications     guaiFENesin (MUCINEX) 600 MG 12 hr tablet      lisinopril (PRINIVIL,ZESTRIL) 5 MG tablet        No Known Allergies Follow-up Information    Follow up with [name],George C, MD. Call in 1 week.   Specialty:  Nephrology   Contact information:   [address] [place] [address] [phone]        The results of significant diagnostics from this hospitalization (including imaging, microbiology, ancillary and laboratory) are listed below for reference.    Significant Diagnostic Studies: Dg Chest 2 View  09/21/2015  CLINICAL DATA:  CHF.  Cough. EXAM: CHEST  2 VIEW COMPARISON:  09/11/2015.  05/22/2015 .  08/23/2008. FINDINGS: Mediastinum hilar structures normal. Cardiomegaly with pulmonary vascular prominence and bilateral interstitial prominence with basilar  pulmonary alveolar infiltrates. Findings consistent with congestive heart failure with pulmonary edema and pleural effusions. Basilar pneumonia cannot be excluded . Underlying chronic interstitial lung disease is present. No pleural effusion or pneumothorax . IMPRESSION: 1. Congestive heart failure with bilateral pulmonary edema and pleural effusion. 2. Underlying chronic interstitial lung disease. Electronically Signed   By: Marcello Moores  Register   On: 09/21/2015 07:49   Dg Chest 2 View  09/11/2015  CLINICAL DATA:  Cardiac catheterization.  Shortness of breath . EXAM: CHEST  2 VIEW COMPARISON:  CT 08/22/2015. Chest x-ray 08/22/2015, 08/20/2015, 05/22/2015. FINDINGS: Cardiomegaly with diffuse bilateral pulmonary interstitial prominence including Kerley B-lines. Component of pulmonary interstitial edema and/or pneumonitis present this fashion. Underlying chronic interstitial lung disease is also most likely present. Small bilateral pleural effusions cannot be excluded. No pneumothorax . IMPRESSION: Cardiomegaly with mild diffuse pulmonary interstitial prominence including Kerley B-lines. Small pleural effusions. Findings most consistent with  congestive heart failure with mild interstitial edema. A component of underlying chronic interstitial lung disease is also most likely present . Electronically Signed   By: Marcello Moores  Register   On: 09/11/2015 07:27   Nm Bone Scan Whole Body  09/26/2015  CLINICAL DATA:  Nephrotic syndrome with elevated kappa light chain EXAM: NUCLEAR MEDICINE WHOLE BODY BONE SCAN TECHNIQUE: Whole body anterior and posterior images were obtained approximately 3 hours after intravenous injection of radiopharmaceutical. RADIOPHARMACEUTICALS:  25.4 mCi Technetium-46mMDP IV COMPARISON:  None. FINDINGS: There is abnormal radiotracer uptake at the junction of the mid and distal thirds of the right femur. Increased uptake in both knees and shoulders most likely is of arthropathic etiology. Elsewhere, the  distribution of radiotracer uptake is unremarkable. Scoliosis is noted in the lumbar spine. Flank positioned kidneys noted bilaterally. IMPRESSION: Focal increased uptake of radiotracer at the junction of the mid and distal thirds of the right femur. Etiology for this isolated lesion is uncertain. Radiographic correlation advised. Areas of presumed arthropathy in the knees and shoulders. Distribution of uptake otherwise unremarkable. It should be noted that the sensitivity for potential multiple myeloma is variable with respect to multiple myeloma. If there is strong clinical concern for multiple myeloma, metastatic bone survey may be advisable for further assessment. Electronically Signed   By: WLowella GripIII M.D.   On: 09/26/2015 07:16   UKoreaAbdomen Complete  09/23/2015  CLINICAL DATA:  65year old male with chronic edema, abdominal discomfort and distention. Acute renal failure. EXAM: ABDOMEN ULTRASOUND COMPLETE COMPARISON:  None. FINDINGS: Gallbladder: A few mobile gallstones are identified, the largest measuring 4 mm. There is no evidence of gallbladder wall thickening, sonographic Murphy's sign or pericholecystic fluid. Common bile duct: Diameter: 3.8 mm. There is no evidence of intrahepatic or extrahepatic biliary dilatation. Is Liver: No focal lesion identified. Within normal limits in parenchymal echogenicity. IVC: No abnormality visualized. Pancreas: Visualized portion unremarkable. Spleen: Splenomegaly is noted with splenic volume of 570 cc. No focal splenic abnormalities noted. Right Kidney: Length: 12.6 cm. Echogenicity within normal limits. No mass or hydronephrosis visualized. Left Kidney: Length: 12.9 cm. Echogenicity within normal limits. No mass or hydronephrosis visualized. Abdominal aorta: An infrarenal suprailiac distal abdominal aortic aneurysm is identified measuring 3 cm. Other findings: There is no evidence of ascites. Small to moderate bilateral pleural effusions are noted.  IMPRESSION: Unremarkable liver. No gross morphologic changes of cirrhosis on this examination. Splenomegaly. Small to moderate bilateral pleural effusions. No evidence of ascites. Cholelithiasis without evidence of acute cholecystitis. 3 cm distal abdominal aortic aneurysm. Electronically Signed   By: JMargarette CanadaM.D.   On: 09/23/2015 09:03   UKoreaBiopsy  09/27/2015  INDICATION: 65year old with proteinuria and possible nephrotic syndrome. Request for renal biopsy. EXAM: ULTRASOUND-GUIDED LEFT KIDNEY BIOPSY MEDICATIONS: None. ANESTHESIA/SEDATION: Fentanyl 25 mcg IV; Versed 1.0 mg IV Moderate Sedation Time:  10 minutes The patient was continuously monitored during the procedure by the interventional radiology nurse under my direct supervision. FLUOROSCOPY TIME:  None COMPLICATIONS: None immediate. PROCEDURE: Informed written consent was obtained from the patient after a thorough discussion of the procedural risks, benefits and alternatives. All questions were addressed. A timeout was performed prior to the initiation of the procedure. Ultrasound was used to evaluate both kidneys. The left kidney was selected for biopsy. The left flank was prepped with chlorhexidine. The left flank was prepped and draped in a sterile fashion. Skin was anesthetized with 1% lidocaine. A 16 gauge core needle was directed towards the left kidney  lower pole with ultrasound guidance. Two core biopsies were obtained within the lower pole cortex. Specimens were placed in saline. Bandage placed over the puncture site. FINDINGS: Normal appearance of the left kidney without hydronephrosis. Core biopsy needle was directed into the lower pole cortex with ultrasound guidance. No significant bleeding or hematoma formation following the core biopsies. IMPRESSION: Successful ultrasound-guided core biopsy of left kidney. Electronically Signed   By: Markus Daft M.D.   On: 09/27/2015 10:31   Dg Bone Survey Met  09/26/2015  CLINICAL DATA:  Monoclonal  light chain deposition disease EXAM: METASTATIC BONE SURVEY COMPARISON:  09/21/2015 FINDINGS: Frontal and lateral view of the skull shows no lytic or blastic lesion. Frontal view bilateral shoulders demonstrates no lytic or blastic lesion. Frontal view bilateral humerus shows no lytic or blastic lesions. Frontal view bilateral forearm shows no lytic or blastic lesions. Frontal and lateral view of cervical spine shows no lytic or blastic lesion. Alignment and vertebral body heights are preserved. Minimal anterior spurring lower endplate of C5 and C6 vertebral body. No prevertebral soft tissue swelling. Frontal and lateral view of thoracic spine shows alignment preserved. No lytic or blastic lesion. Frontal and lateral view of lumbar spine shows no lytic or blastic lesion. Mild dextroscoliosis lumbar spine. Frontal view of the chest shows no rib lesions. Mild interstitial prominence bilateral suspicious for mild interstitial edema. Frontal view of the pelvis shows no lytic or blastic lesions. Intra medullary rod is noted in proximal right femur. Four views bilateral femur submitted. No lytic or blastic lesion. Mild degenerative changes bilateral knee joints. There is narrowing of medial joint space terminal knee joint. There is old fracture deformity midshaft of the right femur. Question of fracture of the left patella. IMPRESSION: 1. No lytic or blastic lesions are not identified. No acute fracture or subluxation. Degenerative changes as described above. Intra medullary rod and old fracture deformity noted right femur. Electronically Signed   By: Lahoma Crocker M.D.   On: 09/26/2015 13:14    Microbiology: Recent Results (from the past 240 hour(s))  Culture, blood (Routine X 2) w Reflex to ID Panel     Status: None   Collection Time: 09/20/15  5:42 PM  Result Value Ref Range Status   Specimen Description BLOOD RIGHT ANTECUBITAL  Final   Special Requests BOTTLES DRAWN AEROBIC AND ANAEROBIC 5CC  Final   Culture NO  GROWTH 5 DAYS  Final   Report Status 09/25/2015 FINAL  Final  Culture, blood (Routine X 2) w Reflex to ID Panel     Status: None   Collection Time: 09/20/15  5:46 PM  Result Value Ref Range Status   Specimen Description BLOOD LEFT ANTECUBITAL  Final   Special Requests BOTTLES DRAWN AEROBIC AND ANAEROBIC 5CC  Final   Culture NO GROWTH 5 DAYS  Final   Report Status 09/25/2015 FINAL  Final     Labs: Basic Metabolic Panel:  Recent Labs Lab 09/25/15 0328 09/26/15 0450 09/27/15 0500 09/28/15 0420 09/29/15 0527  NA 141 140 141 141 140  K 3.9 4.0 4.9 4.4 4.3  CL 100* 104 104 102 100*  CO2 31 29 29 29 28   GLUCOSE 123* 104* 114* 116* 108*  BUN 38* 38* 35* 34* 35*  CREATININE 1.52* 1.57* 1.47* 1.58* 1.53*  CALCIUM 7.7* 7.5* 8.2* 8.0* 8.1*   Liver Function Tests: No results for input(s): AST, ALT, ALKPHOS, BILITOT, PROT, ALBUMIN in the last 168 hours. No results for input(s): LIPASE, AMYLASE in the last 168  hours. No results for input(s): AMMONIA in the last 168 hours. CBC:  Recent Labs Lab 09/24/15 0430 09/26/15 0450 09/30/15 1019  WBC 8.5 4.8 7.2  HGB 10.2* 9.1* 8.9*  HCT 33.1* 29.3* 29.5*  MCV 86.6 87.2 86.0  PLT 180 185 389   Cardiac Enzymes: No results for input(s): CKTOTAL, CKMB, CKMBINDEX, TROPONINI in the last 168 hours. BNP: BNP (last 3 results)  Recent Labs  08/22/15 0101 09/20/15 1738  BNP 465.9* 980.5*    ProBNP (last 3 results)  Recent Labs  08/29/15 1554  PROBNP 894.0*    CBG: No results for input(s): GLUCAP in the last 168 hours.     Signed:  Birdie Hopes MD.  [facility] 09/30/2015, 1:20 PM

## 2015-10-09 ENCOUNTER — Telehealth: Payer: Self-pay | Admitting: Cardiovascular Disease

## 2015-10-09 NOTE — Telephone Encounter (Signed)
New message     Pt c/o swelling: STAT is pt has developed SOB within 24 hours  1. How long have you been experiencing swelling? Since yesterday 2. Where is the swelling located? Feet, ankles and legs 3.  Are you currently taking a "fluid pill"?  yes 4.  Are you currently SOB? yes  5.  Have you traveled recently?  No Pt states he has gained 6lbs in 2 days

## 2015-10-09 NOTE — Telephone Encounter (Signed)
Dr. Kennon Holter patient.  Pt reports weight up 6 lbs in 2 days. He was recently discharged from hospital about 1 week ago. SOB, fatigued.  He reports taking 40mg  furosemide a day.  Did not receive instruction to increase this dose, though a discharge dose instructs 160mg  furosemide BID  Advised pt to double currently administered dose to 40mg  BID for next 2 days. Will add for a flex PA visit tomorrow or Thurs.  Pending arrangement of appt by scheduler.

## 2015-10-09 NOTE — Telephone Encounter (Signed)
Called back after scheduling pt for visit. Gave pt information for a visit w/ Lurena Joiner this Friday at [facility].  He reports taking 2nd dose of lasix - he notes an increase in urination. Advised repeat of doubled dose tomorrow, repeat again on Thursday if needed, follow up w/ Lurena Joiner on Friday for further advice. Pt voiced thanks.

## 2015-10-10 ENCOUNTER — Encounter (HOSPITAL_COMMUNITY): Payer: Self-pay

## 2015-10-12 ENCOUNTER — Encounter: Payer: Self-pay | Admitting: Cardiology

## 2015-10-12 ENCOUNTER — Ambulatory Visit (INDEPENDENT_AMBULATORY_CARE_PROVIDER_SITE_OTHER): Payer: Medicare Other | Admitting: Cardiology

## 2015-10-12 VITALS — BP 150/109 | HR 108 | Resp 24 | Ht 76.0 in | Wt 221.6 lb

## 2015-10-12 DIAGNOSIS — R0602 Shortness of breath: Secondary | ICD-10-CM

## 2015-10-12 DIAGNOSIS — I5023 Acute on chronic systolic (congestive) heart failure: Secondary | ICD-10-CM | POA: Diagnosis not present

## 2015-10-12 DIAGNOSIS — J449 Chronic obstructive pulmonary disease, unspecified: Secondary | ICD-10-CM | POA: Insufficient documentation

## 2015-10-12 DIAGNOSIS — J9601 Acute respiratory failure with hypoxia: Secondary | ICD-10-CM

## 2015-10-12 DIAGNOSIS — I471 Supraventricular tachycardia: Secondary | ICD-10-CM

## 2015-10-12 DIAGNOSIS — N02B9 Other recurrent and persistent immunoglobulin A nephropathy: Secondary | ICD-10-CM | POA: Insufficient documentation

## 2015-10-12 DIAGNOSIS — I10 Essential (primary) hypertension: Secondary | ICD-10-CM | POA: Diagnosis not present

## 2015-10-12 DIAGNOSIS — N049 Nephrotic syndrome with unspecified morphologic changes: Secondary | ICD-10-CM | POA: Insufficient documentation

## 2015-10-12 DIAGNOSIS — N179 Acute kidney failure, unspecified: Secondary | ICD-10-CM | POA: Diagnosis not present

## 2015-10-12 DIAGNOSIS — I498 Other specified cardiac arrhythmias: Secondary | ICD-10-CM | POA: Diagnosis not present

## 2015-10-12 DIAGNOSIS — I872 Venous insufficiency (chronic) (peripheral): Secondary | ICD-10-CM

## 2015-10-12 DIAGNOSIS — N059 Unspecified nephritic syndrome with unspecified morphologic changes: Secondary | ICD-10-CM

## 2015-10-12 LAB — CBC WITH DIFFERENTIAL/PLATELET
Basophils Absolute: 0.1 10*3/uL (ref 0.0–0.1)
Basophils Relative: 1 % (ref 0–1)
Eosinophils Absolute: 0.1 10*3/uL (ref 0.0–0.7)
Eosinophils Relative: 1 % (ref 0–5)
HCT: 31.4 % — ABNORMAL LOW (ref 39.0–52.0)
Hemoglobin: 9.9 g/dL — ABNORMAL LOW (ref 13.0–17.0)
Lymphocytes Relative: 22 % (ref 12–46)
Lymphs Abs: 2 10*3/uL (ref 0.7–4.0)
MCH: 26.3 pg (ref 26.0–34.0)
MCHC: 31.5 g/dL (ref 30.0–36.0)
MCV: 83.3 fL (ref 78.0–100.0)
MPV: 8.6 fL (ref 8.6–12.4)
Monocytes Absolute: 0.5 10*3/uL (ref 0.1–1.0)
Monocytes Relative: 5 % (ref 3–12)
Neutro Abs: 6.5 10*3/uL (ref 1.7–7.7)
Neutrophils Relative %: 71 % (ref 43–77)
Platelets: 478 10*3/uL — ABNORMAL HIGH (ref 150–400)
RBC: 3.77 MIL/uL — ABNORMAL LOW (ref 4.22–5.81)
RDW: 16.4 % — ABNORMAL HIGH (ref 11.5–15.5)
WBC: 9.1 10*3/uL (ref 4.0–10.5)

## 2015-10-12 LAB — BASIC METABOLIC PANEL
BUN: 18 mg/dL (ref 7–25)
CO2: 27 mmol/L (ref 20–31)
Calcium: 8.4 mg/dL — ABNORMAL LOW (ref 8.6–10.3)
Chloride: 101 mmol/L (ref 98–110)
Creat: 1.56 mg/dL — ABNORMAL HIGH (ref 0.70–1.25)
Glucose, Bld: 86 mg/dL (ref 65–99)
Potassium: 4.1 mmol/L (ref 3.5–5.3)
Sodium: 140 mmol/L (ref 135–146)

## 2015-10-12 LAB — BRAIN NATRIURETIC PEPTIDE: Brain Natriuretic Peptide: 1188.7 pg/mL — ABNORMAL HIGH (ref <100)

## 2015-10-12 MED ORDER — METOLAZONE 2.5 MG PO TABS: ORAL_TABLET | ORAL | Status: DC

## 2015-10-12 NOTE — Assessment & Plan Note (Signed)
PSVT- s/p RFA 09/10/15

## 2015-10-12 NOTE — Assessment & Plan Note (Signed)
>>  ASSESSMENT AND PLAN FOR BERGER'S DISEASE: IGA NEPHROPATHY  WRITTEN ON 10/12/2015 10:22 AM BY Rosalyn Gess, LUKE K, PA-C  .

## 2015-10-12 NOTE — Assessment & Plan Note (Addendum)
Hospitalized 2/9-2/19/17 for combined CHF/COPD/ acute renal insufficiency

## 2015-10-12 NOTE — Progress Notes (Signed)
10/12/2015 George [name]   1950/08/24  [id number]  Primary Physician [name], FNP Primary Cardiologist: Dr Lovena Le  HPI:  65 y.o. male with a hx of PSVT, COPD, systolic CHF, DCM, HTN, chronic LE extremity edema from venous insufficiency with assoc venous stasis ulcers. He was admitted in 10/16 with SVT and elevated Troponin levels. Myoview was low risk at that time. He was admitted 1/17 with AECOPD. He had recurrent SVT on the floor that converted with IV Adenosine. He was seen by EP and RFCA was recommended. 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 Cristopher [place]. He required IV Lasix prior to DC due to worsening dyspnea. Metoprolol was DC'd then. He was seen in the office 09/20/15 and was in CHF. He was admitted for further evaluation. With diuresis his SCr rose to a peak of 1.94 and we asked renal to see the pt in consult. After they're work up they felt the pt had Glomerulonephritits and Nephrotic Syndrome. The pt was discharged 09/30/15. He is in the office today for follow up. He still appears volume overloaded with SOB, LE edema and bilateral rales. His O2 sat when he arrived was 89%- resp 24. He tells me he can't lay flat at night and has been sleeping in a recliner. He increased his Lasix to 80 mg daily on his own 3 days ago with some improvement in his symptoms.    Current Outpatient Prescriptions  Medication Sig Dispense Refill  . aspirin EC 81 MG EC tablet Take 1 tablet (81 mg total) by mouth daily.    . furosemide (LASIX) 80 MG tablet Take 80 mg by mouth 2 (two) times daily.    Marland Kitchen levalbuterol (XOPENEX HFA) 45 MCG/ACT inhaler Inhale 1-2 puffs into the lungs every 4 (four) hours as needed for wheezing. 1 Inhaler 12  . nebivolol (BYSTOLIC) 5 MG tablet Take 1 tablet (5 mg total) by mouth daily. 30 tablet 0  . zolpidem (AMBIEN) 5 MG tablet Take 1 tablet (5 mg total) by mouth at bedtime as needed for sleep. 15 tablet 1  . metolazone (ZAROXOLYN) 2.5 MG tablet TAKE 1 TABLET BY  MOUTH FOR 2 DAYS STARTING 10/13/15 2 tablet 0   No current facility-administered medications for this visit.    No Known Allergies  Social History   Social History  . Marital Status: Divorced    Spouse Name: N/A  . Number of Children: 0  . Years of Education: 12   Occupational History  . Retired    Social History Main Topics  . Smoking status: Former Smoker -- 2.00 packs/day for 50 years    Types: Cigarettes    Quit date: 01/24/2015  . Smokeless tobacco: Never Used  . Alcohol Use: No  . Drug Use: No  . Sexual Activity: Not Currently   Other Topics Concern  . Not on file   Social History Narrative   Fun: Trade out work, outdoors   Denies religious beliefs effecting health care.      Review of Systems: General: negative for chills, fever, night sweats or weight changes.  Cardiovascular: negative for chest pain, dyspnea on exertion, edema, orthopnea, palpitations, paroxysmal nocturnal dyspnea or shortness of breath Dermatological: negative for rash Respiratory: negative for cough or wheezing Urologic: negative for hematuria Abdominal: negative for nausea, vomiting, diarrhea, bright red blood per rectum, melena, or hematemesis Neurologic: negative for visual changes, syncope, or dizziness All other systems reviewed and are otherwise negative except as noted above.  Blood pressure 150/109, pulse 108, resp. rate 24, height 6\' 4"  (1.93 m), weight 221 lb 9.6 oz (100.517 kg), SpO2 89 %.  General appearance: alert, cooperative, mild distress and pale Lungs: bilateral rales Heart: regular rate and rhythm Extremities: 1+ LE edema Skin: pale, cool, dry Neurologic: Grossly normal  Echo: 09/21/15 Study Conclusions  - Left ventricle: The cavity size was normal. Systolic function was at the lower limits of normal. The estimated ejection fraction was in the range of 50% to 55%. Although no diagnostic regional wall motion abnormality was identified, this possibility  cannot be completely excluded on the basis of this study. Left ventricular diastolic function parameters were normal. - Left atrium: The atrium was mildly dilated.  Impressions:  - Compared to January 11 , 2017 study, the left ventricular systolic function has improved.   ASSESSMENT AND PLAN:   SVT (supraventricular tachycardia) (HCC) PSVT- s/p RFA 09/10/15  Acute respiratory failure ([place]) Hospitalized 2/9-2/19/17 for combined CHF/COPD/ acute renal insufficiency  Venous insufficiency of both lower extremities Chronic LE edema  Glomerulonephritis .  Nephrotic syndrome .  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 Pt smoked and worked in a [employer]  Pt seen by myself and Dr Harrington Challenger today. I suggested admission but the pt declined saying he was slightly improved and didn't think he was a lot worse than when he left the hospital. O2 sats with ambulation actually went up to 90 so he doesn't qualify for home O2. We added Metolazone 2.5 mg x 2 doses. He'll return Monday. He has an appointment with renal 10/19/15. Labs were obtained today- CBC, BMP, BNP.   Kerin Ransom K PA-C 10/12/2015 10:28 AM   Pt seen and exaimined  I agree with findings as noted by L Onis Markoff above  Will try outp trial of increased diuretcs  Labs today and on Monday.   Dorris Carnes

## 2015-10-12 NOTE — Assessment & Plan Note (Signed)
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

## 2015-10-12 NOTE — Patient Instructions (Addendum)
Medication Instructions:  Your physician has recommended you make the following change in your medication:  1.  START Metolazone 2.5 mg taking 1 tablet by mouth X's 2 days (STARTING TOMORROW, 10/13/15)  Labwork: None ordered  Testing/Procedures: TODAY:  BMP, BNP, & CBC W/DIFF  Follow-Up: Your physician recommends that you schedule a follow-up appointment ON 10/15/15 OR 10/16/15 WITH FLEX   Any Other Special Instructions Will Be Listed Below (If Applicable).   If you need a refill on your cardiac medications before your next appointment, please call your pharmacy.

## 2015-10-12 NOTE — Assessment & Plan Note (Signed)
Chronic LE edema 

## 2015-10-15 NOTE — Progress Notes (Signed)
Cardiology Office Note:    Date:  10/16/2015   ID:  George [name], DOB 16-Jun-1951, MRN [id number]  PCP:  [name], [place]  Cardiologist:  Dr. Quay Burow   Electrophysiologist:  Dr. Cristopher Peru  Nephrologist - Dr. Florene Glen.   Chief Complaint  Patient presents with  . Congestive Heart Failure    Follow up    History of Present Illness:     George [name] is a 65 y.o. male with a hx of PSVT, COPD, HTN, chronic LE extremity edema from venous insufficiency with assoc venous stasis ulcers. He was admitted in 10/16 with SVT and elevated Troponin levels. Myoview was low risk at that time. He was admitted 1/17 with AECOPD. He had recurrent SVT on the floor that converted with IV Adenosine. He was seen by EP and RFCA was recommended. Of note, echo at that time demonstrated EF 40-45% with diffuse HK. 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 Cristopher Peru. He required IV Lasix prior to DC due to worsening dyspnea. Metoprolol was DC'd.   Admitted from the office 2/9-2/19. He presented with acute respiratory failure. He was treated for anasarca in the setting of nephrotic syndrome. Pulmonary edema was noted on CXR.  Protein > 300 on UA.  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 He was followed by nephrology and maintained on high-dose diuretics.  Seen in FU with Kerin Ransom, PA-C.  He remained volume overloaded.  He was given 2 doses of Metolazone.  He had no change with these 2 doses.  Weight continues to go down. He sleeps on 1 pillow. Has occasional PND.  LE edema is unchanged.  No chest pain, cough, syncope or dizziness.  He remains short of breath with minimal activity.  He is NYHA 2b-3.  He notes stable symptoms since he went to the hospital. He does not feel any worse (or better).     Past Medical History  Diagnosis Date  . SVT (supraventricular tachycardia) ([place]) 05/22/2015  . GERD (gastroesophageal reflux disease)   . Panic attacks   . Basal cell carcinoma of  right side of nose   . CHF (congestive heart failure) ([place])   . Berger's disease   . AAA (abdominal aortic aneurysm) ([place])     a. [place] 09/2015 - 3cm distal AAA.  Marland Kitchen ARF (acute renal failure) ([place]) 09/2015  . Shortness of breath dyspnea     Past Surgical History  Procedure Laterality Date  . Fracture surgery    . Tonsillectomy and adenoidectomy  ~ 1955  . Knee arthroscopy Right ~ 1985  . Femur fracture surgery Right 1970    "had plate & pin put in"  . Femur hardware removal Right 1971    "removed plate; left pin in  . Basal cell carcinoma excision Right ~ 06/2015    "side of my nose"  . Electrophysiologic study N/A 09/10/2015    Procedure: SVT Ablation;  Surgeon: Evans Lance, MD;  Location: [facility];  Service: Cardiovascular;  Laterality: N/A;    Current Medications: Outpatient Prescriptions Prior to Visit  Medication Sig Dispense Refill  . aspirin EC 81 MG EC tablet Take 1 tablet (81 mg total) by mouth daily.    Marland Kitchen levalbuterol (XOPENEX HFA) 45 MCG/ACT inhaler Inhale 1-2 puffs into the lungs every 4 (four) hours as needed for wheezing. 1 Inhaler 12  . nebivolol (BYSTOLIC) 5 MG tablet Take 1 tablet (5 mg total) by mouth daily. 30 tablet 0  .  zolpidem (AMBIEN) 5 MG tablet Take 1 tablet (5 mg total) by mouth at bedtime as needed for sleep. 15 tablet 1  . furosemide (LASIX) 80 MG tablet Take 80 mg by mouth 2 (two) times daily.    . metolazone (ZAROXOLYN) 2.5 MG tablet TAKE 1 TABLET BY MOUTH FOR 2 DAYS STARTING 10/13/15 (Patient not taking: Reported on 10/16/2015) 2 tablet 0   No facility-administered medications prior to visit.     Allergies:   Review of patient's allergies indicates no known allergies.   Social History   Social History  . Marital Status: Divorced    Spouse Name: N/A  . Number of Children: 0  . Years of Education: 12   Occupational History  . Retired    Social History Main Topics  . Smoking status: Former Smoker -- 2.00 packs/day for 50 years     Types: Cigarettes    Quit date: 01/24/2015  . Smokeless tobacco: Never Used  . Alcohol Use: No  . Drug Use: No  . Sexual Activity: Not Currently   Other Topics Concern  . None   Social History Narrative   Fun: Trade out work, outdoors   Denies religious beliefs effecting health care.      Family History:  The patient's family history includes Diabetes in his paternal grandmother; Healthy in his maternal grandfather, maternal grandmother, and paternal grandfather; Heart attack in his father and mother; Heart disease in his father and mother; Heart failure in his father. There is no history of Hypertension or Stroke.   ROS:   Please see the history of present illness.    Review of Systems  Constitution: Positive for malaise/fatigue and weight loss.  Cardiovascular: Positive for dyspnea on exertion and leg swelling.  Respiratory: Positive for shortness of breath.   Musculoskeletal: Positive for joint pain.  All other systems reviewed and are negative.   Physical Exam:    VS:  BP 144/80 mmHg  Pulse 82  Ht 6\' 4"  (1.93 m)  Wt 217 lb 12.8 oz (98.793 kg)  BMI 26.52 kg/m2  SpO2 93%   GEN: Well nourished, well developed, in no acute distress HEENT: normal Neck: no JVD, no masses Cardiac: Normal S1/S2, RRR; no murmurs,1+ bilat LE edema    Respiratory:  clear to auscultation bilaterally; no wheezing, rhonchi or rales GI: soft, nontender, nondistended  MS: no deformity or atrophy Skin: warm and dry  Neuro:    no focal deficits  Psych: Alert and oriented x 3, normal affect   Wt Readings from Last 3 Encounters:  10/16/15 217 lb 12.8 oz (98.793 kg)  10/12/15 221 lb 9.6 oz (100.517 kg)  09/30/15 221 lb 3.2 oz (100.336 kg)      Studies/Labs Reviewed:     EKG:  EKG is  ordered today.  The ekg ordered today demonstrates NSR, HR 81, normal axis, LVH, QTc 469 ms, no significant change compared to prior  Recent Labs: 08/29/2015: Pro B Natriuretic peptide (BNP) 894.0* 09/20/2015: ALT  17; B Natriuretic Peptide 980.5*; Magnesium 2.0; TSH 4.860* 10/12/2015: BUN 18; Creat 1.56*; Hemoglobin 9.9*; Platelets 478*; Potassium 4.1; Sodium 140   Recent Lipid Panel    Component Value Date/Time   CHOL 187 08/29/2015 1554   TRIG 146.0 08/29/2015 1554   HDL 41.80 08/29/2015 1554   CHOLHDL 4 08/29/2015 1554   VLDL 29.2 08/29/2015 1554   LDLCALC 116* 08/29/2015 1554    Additional studies/ records that were reviewed today include:   Echo 09/21/15 EF 50-55%, mild  LAE  Echo 08/22/15 Mod LVH, EF 40-45%, diff HK, mild MR  Myoview 10/16 1. Inferior wall attenuation on stress and rest imaging probably due to diaphragmatic attenuation given the lack of abnormal wall motion in this vicinity. Scarring of the inferior wall is a differential diagnostic consideration. No inducible ischemia. 2. Normal left ventricular wall motion. 3. Left ventricular ejection fraction 65% 4. Low-risk stress test findings*.   ASSESSMENT:     1. Chronic systolic heart failure ([place])   2. Nephrotic syndrome   3. SVT (supraventricular tachycardia) ([place])   4. Essential hypertension   5. CKD (chronic kidney disease), stage 3 (moderate)     PLAN:     In order of problems listed above:  1. Nephrotic syndrome - This is contributing to volume excess.  Repeat echo in the hospital demonstrated normal LVF.  It was not thought that diastolic dysfunction was the cause of his volume excess.  Review of his chart indicates he was to be discharged on Lasix 160 mg twice a day. He has been on 80 mg twice a day since discharge. In review of his symptoms, he notes no change in his shortness of breath or edema since going to the hospital. His symptoms have not gotten any worse. His weight continues to go down. He sees nephrology later this week.  -  Follow-up with nephrology as planned  -  Increase Lasix to 120 mg twice a day  -  BMET, BNP today  -  BMET one week (unless done at nephrology visit)  2. PSVT - s/p RFCA. No  apparent recurrence.   3. HTN - Fair control.  His ACE inhibitor was stopped during admit due to renal insufficiency.  Defer decision when to resume to Nephrology.  4. CKD - FU with Nephrology as planned.     Medication Adjustments/Labs and Tests Ordered: Current medicines are reviewed at length with the patient today.  Concerns regarding medicines are outlined above.  Medication changes, Labs and Tests ordered today are outlined in the Patient Instructions noted below. Patient Instructions  Medication Instructions:  1. STOP METOLAZONE 2. INCREASE LASIX TO 120 MG TWICE DAILY; THIS WILL BE 1 AND 1/2 TABS TWICE DAILY  Labwork: 1. TODAY BMET, BNP 2. BMET TO BE DONE IN 1 WEEK; YOU MAY CANCEL THIS LAB WORK IF DR. Florene Glen DOES LAB WORK NEXT WEEK AND CHECK BMET  Testing/Procedures: NONOE  Follow-Up: DR. [name] OR PA/NP AT [facility] 3-4 WEEKS  Any Other Special Instructions Will Be Listed Below (If Applicable).  If you need a refill on your cardiac medications before your next appointment, please call your pharmacy.   Signed, Richardson Dopp, PA-C  10/16/2015 12:44 PM    [facility] [address], [place], [place]  [address] Phone: [phone]; Fax: [phone]

## 2015-10-16 ENCOUNTER — Encounter: Payer: Self-pay | Admitting: Physician Assistant

## 2015-10-16 ENCOUNTER — Ambulatory Visit (INDEPENDENT_AMBULATORY_CARE_PROVIDER_SITE_OTHER): Payer: Medicare Other | Admitting: Physician Assistant

## 2015-10-16 ENCOUNTER — Ambulatory Visit: Payer: Medicare Other | Admitting: Nurse Practitioner

## 2015-10-16 VITALS — BP 144/80 | HR 82 | Ht 76.0 in | Wt 217.8 lb

## 2015-10-16 DIAGNOSIS — N049 Nephrotic syndrome with unspecified morphologic changes: Secondary | ICD-10-CM

## 2015-10-16 DIAGNOSIS — I471 Supraventricular tachycardia: Secondary | ICD-10-CM

## 2015-10-16 DIAGNOSIS — N183 Chronic kidney disease, stage 3 (moderate): Secondary | ICD-10-CM

## 2015-10-16 DIAGNOSIS — I10 Essential (primary) hypertension: Secondary | ICD-10-CM | POA: Diagnosis not present

## 2015-10-16 DIAGNOSIS — I5022 Chronic systolic (congestive) heart failure: Secondary | ICD-10-CM | POA: Diagnosis not present

## 2015-10-16 LAB — BASIC METABOLIC PANEL
BUN: 17 mg/dL (ref 7–25)
CALCIUM: 8.6 mg/dL (ref 8.6–10.3)
CHLORIDE: 102 mmol/L (ref 98–110)
CO2: 30 mmol/L (ref 20–31)
CREATININE: 1.19 mg/dL (ref 0.70–1.25)
Glucose, Bld: 81 mg/dL (ref 65–99)
Potassium: 4.1 mmol/L (ref 3.5–5.3)
Sodium: 141 mmol/L (ref 135–146)

## 2015-10-16 MED ORDER — FUROSEMIDE 80 MG PO TABS: 120.0000 mg | ORAL_TABLET | Freq: Two times a day (BID) | ORAL | Status: DC

## 2015-10-16 NOTE — Patient Instructions (Addendum)
Medication Instructions:  1. STOP METOLAZONE 2. INCREASE LASIX TO 120 MG TWICE DAILY; THIS WILL BE 1 AND 1/2 TABS TWICE DAILY  Labwork: 1. TODAY BMET, BNP 2. BMET TO BE DONE IN 1 WEEK; YOU MAY CANCEL THIS LAB WORK IF DR. Florene Glen DOES LAB WORK NEXT WEEK AND CHECK BMET  Testing/Procedures: NONOE  Follow-Up: DR. [name] OR PA/NP AT [facility] 3-4 WEEKS  Any Other Special Instructions Will Be Listed Below (If Applicable).  If you need a refill on your cardiac medications before your next appointment, please call your pharmacy.

## 2015-10-17 ENCOUNTER — Telehealth: Payer: Self-pay | Admitting: *Deleted

## 2015-10-17 LAB — BRAIN NATRIURETIC PEPTIDE: Brain Natriuretic Peptide: 527.7 pg/mL — ABNORMAL HIGH (ref <100)

## 2015-10-17 NOTE — Telephone Encounter (Signed)
Pt notified of lab results by phone with verbal understanding.  

## 2015-10-19 DIAGNOSIS — N183 Chronic kidney disease, stage 3 (moderate): Secondary | ICD-10-CM | POA: Diagnosis not present

## 2015-10-19 DIAGNOSIS — N049 Nephrotic syndrome with unspecified morphologic changes: Secondary | ICD-10-CM | POA: Diagnosis not present

## 2015-10-19 DIAGNOSIS — N055 Unspecified nephritic syndrome with diffuse mesangiocapillary glomerulonephritis: Secondary | ICD-10-CM | POA: Diagnosis not present

## 2015-10-19 DIAGNOSIS — B182 Chronic viral hepatitis C: Secondary | ICD-10-CM | POA: Diagnosis not present

## 2015-10-19 DIAGNOSIS — B370 Candidal stomatitis: Secondary | ICD-10-CM | POA: Diagnosis not present

## 2015-10-26 ENCOUNTER — Ambulatory Visit: Payer: Medicare Other | Admitting: Nurse Practitioner

## 2015-11-06 ENCOUNTER — Ambulatory Visit (INDEPENDENT_AMBULATORY_CARE_PROVIDER_SITE_OTHER): Payer: Medicare Other | Admitting: Physician Assistant

## 2015-11-06 ENCOUNTER — Encounter: Payer: Self-pay | Admitting: Physician Assistant

## 2015-11-06 VITALS — BP 140/90 | HR 80 | Ht 76.0 in | Wt 205.0 lb

## 2015-11-06 DIAGNOSIS — N183 Chronic kidney disease, stage 3 (moderate): Secondary | ICD-10-CM

## 2015-11-06 DIAGNOSIS — I10 Essential (primary) hypertension: Secondary | ICD-10-CM

## 2015-11-06 DIAGNOSIS — N059 Unspecified nephritic syndrome with unspecified morphologic changes: Secondary | ICD-10-CM

## 2015-11-06 DIAGNOSIS — Z79899 Other long term (current) drug therapy: Secondary | ICD-10-CM | POA: Diagnosis not present

## 2015-11-06 DIAGNOSIS — I471 Supraventricular tachycardia: Secondary | ICD-10-CM | POA: Diagnosis not present

## 2015-11-06 DIAGNOSIS — N049 Nephrotic syndrome with unspecified morphologic changes: Secondary | ICD-10-CM

## 2015-11-06 NOTE — Progress Notes (Signed)
Patient ID: ZACOREY SASO, male   DOB: 1950/08/21, 65 y.o.   MRN: [id number]    Date:  11/06/2015   ID:  KAIEN VADEN, DOB 1950-08-30, MRN [id number]  PCP:  [name], FNP  Primary Cardiologist:  [name]   Chief Complaint  Patient presents with  . Follow-up    no chest pain, swelling & cramping, shortness of breath X51mos. no dizziness or lightheadedness     History of Present Illness: THORNELL VASWANI is a 65 y.o. male with a hx of PSVT, COPD, HTN, chronic LE extremity edema from venous insufficiency with assoc venous stasis ulcers. He was admitted in 10/16 with SVT and elevated Troponin levels. Myoview was low risk at that time. He was admitted 1/17 with AECOPD. He had recurrent SVT on the floor that converted with IV Adenosine.  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 Cristopher Peru. Of note, echo at that time demonstrated EF 40-45% with diffuse HK. He required IV Lasix prior to DC due to worsening dyspnea. Metoprolol was DC'd.   Admitted from the office 2/9-2/19. He presented with acute respiratory failure. He was treated for anasarca in the setting of nephrotic syndrome. Pulmonary edema was noted on CXR. Protein > 300 on UA. 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 He was followed by nephrology and maintained on high-dose diuretics.  Seen in FU with Kerin Ransom, PA-C. He remained volume overloaded. He was given 2 doses of Metolazone. He had no change with these 2 doses. he was seen on March 7 by Richardson Dopp PA-C and at that time his weight continued to go down.  Lasix was increased to 120 mg twice a day.  His BNP on March 3 was 1188 and a March 7 was 527.  Creatinine had improved to 1.19 on the seventh.  Patient presents for follow-up evaluation. His weight today is 205 pounds 12 pounds less than before.  He says he feels a little off today has some mild left lower extremity edema but otherwise  denies nausea, vomiting, fever, chest pain, shortness of breath,  orthopnea, dizziness, PND, cough, congestion, abdominal pain, hematochezia, melena, claudication.  He also states he seems to start treatment for hepatitis C.  Does report left greater than right pain lower extremities when he ambulates. He says this is actually getting better. He walk about 100 yards before he has pain.  ABIs July 2016 were greater than 1.  Wt Readings from Last 3 Encounters:  11/06/15 205 lb (92.987 kg)  10/16/15 217 lb 12.8 oz (98.793 kg)  10/12/15 221 lb 9.6 oz (100.517 kg)     Past Medical History  Diagnosis Date  . SVT (supraventricular tachycardia) ([place]) 05/22/2015  . GERD (gastroesophageal reflux disease)   . Panic attacks   . Basal cell carcinoma of right side of nose   . CHF (congestive heart failure) ([place])   . Berger's disease   . AAA (abdominal aortic aneurysm) ([place])     a. [place] 09/2015 - 3cm distal AAA.  Marland Kitchen ARF (acute renal failure) ([place]) 09/2015  . Shortness of breath dyspnea     Current Outpatient Prescriptions  Medication Sig Dispense Refill  . aspirin EC 81 MG EC tablet Take 1 tablet (81 mg total) by mouth daily.    . furosemide (LASIX) 80 MG tablet Take 1.5 tablets (120 mg total) by mouth 2 (two) times daily. 90 tablet 6  . levalbuterol (XOPENEX HFA) 45 MCG/ACT inhaler Inhale 1-2 puffs into the lungs every 4 (four)  hours as needed for wheezing. 1 Inhaler 12  . potassium chloride (K-DUR) 10 MEQ tablet Take 10 mEq by mouth 3 (three) times daily.    Marland Kitchen zolpidem (AMBIEN) 5 MG tablet Take 1 tablet (5 mg total) by mouth at bedtime as needed for sleep. 15 tablet 1   No current facility-administered medications for this visit.    Allergies:   No Known Allergies  Social History:  The patient  reports that he quit smoking about 9 months ago. His smoking use included Cigarettes. He has a 100 pack-year smoking history. He has never used smokeless tobacco. He reports that he does not drink alcohol or use illicit drugs.   Family history:   Family History  Problem  Relation Age of Onset  . Heart disease Mother     Before age 65  . Heart failure Father   . Heart disease Father     After age 11  . Healthy Maternal Grandmother   . Healthy Maternal Grandfather   . Diabetes Paternal Grandmother   . Healthy Paternal Grandfather   . Heart attack Mother   . Heart attack Father   . Hypertension Neg Hx   . Stroke Neg Hx     ROS:  Please see the history of present illness.  All other systems reviewed and negative.   PHYSICAL EXAM: VS:  BP 140/90 mmHg  Pulse 80  Ht 6\' 4"  (1.93 m)  Wt 205 lb (92.987 kg)  BMI 24.96 kg/m2 Well nourished, well developed, in no acute distress HEENT: Pupils are equal round react to light accommodation extraocular movements are intact.  Neck: no JVDNo cervical lymphadenopathy. Cardiac: Regular rate and rhythm without murmurs rubs or gallops. Lungs:  clear to auscultation bilaterally, no wheezing, rhonchi or rales Abd: soft, nontender, positive bowel sounds all quadrants, no hepatosplenomegaly Ext: no lower extremity edema.  2+ radial and 2+ right DP/PT 0 on the left. Skin: warm and dry Neuro:  Grossly normal    ASSESSMENT AND PLAN:  Problem List Items Addressed This Visit    SVT (supraventricular tachycardia) (HCC)   Nephrotic syndrome   Glomerulonephritis   Essential hypertension   CKD (chronic kidney disease)    Other Visit Diagnoses    Polypharmacy    -  Primary    Relevant Orders    Basic metabolic panel      Mr. Cerbone appears to be doing well. His volume is decreased 12 pounds since his last office visit and he appears euvolemic. He denies any dizziness with position change.  Most recent echocardiogram was 09/21/2015 his ejection fraction was 50-55% with normal diastolic function parameters. This is an  Improvement from his previous echo.   He's been followed by nephrology who is directing his diuretics.  He is probably at a dry weight.  Check a basic metabolic panel today.  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  Blood pressures mildly elevated.  No changes to current medications. Follow-up in 3 months

## 2015-11-06 NOTE — Patient Instructions (Signed)
Your physician recommends that you schedule a follow-up appointment in: 3 months with Dr [name].  Your physician recommends that you return for lab work TODAY.

## 2015-11-14 ENCOUNTER — Encounter: Payer: Self-pay | Admitting: Internal Medicine

## 2015-11-14 ENCOUNTER — Ambulatory Visit (INDEPENDENT_AMBULATORY_CARE_PROVIDER_SITE_OTHER): Payer: Medicare Other | Admitting: Internal Medicine

## 2015-11-14 VITALS — BP 153/85 | HR 97 | Temp 97.6°F | Ht 76.0 in | Wt 212.0 lb

## 2015-11-14 DIAGNOSIS — B182 Chronic viral hepatitis C: Secondary | ICD-10-CM | POA: Diagnosis not present

## 2015-11-14 DIAGNOSIS — B1920 Unspecified viral hepatitis C without hepatic coma: Secondary | ICD-10-CM | POA: Insufficient documentation

## 2015-11-14 MED ORDER — LEDIPASVIR-SOFOSBUVIR 90-400 MG PO TABS: 1.0000 | ORAL_TABLET | Freq: Every day | ORAL | Status: DC

## 2015-11-14 NOTE — Patient Instructions (Signed)
Date 11/14/2015  Dear Mr. Carde, As discussed in the [facility], your hepatitis C therapy will include the following medications:          Harvoni 90mg /400mg  tablet:           Take 1 tablet by mouth once daily   Please note that ALL MEDICATIONS WILL START ON THE SAME DATE for a total of 12 weeks. ---------------------------------------------------------------- Your HCV Treatment Start Date: TBA   Your HCV genotype:  1a    Liver Fibrosis: TBD    ---------------------------------------------------------------- YOUR PHARMACY CONTACT:   [place] [facility] [place] Phone: [phone] Hours: Monday to Friday 7:30 am to 6:00 pm   Please always contact your pharmacy at least 3-4 business days before you run out of medications to ensure your next month's medication is ready or 1 week prior to running out if you receive it by mail.  Remember, each prescription is for 28 days. ---------------------------------------------------------------- GENERAL NOTES REGARDING YOUR HEPATITIS C MEDICATION:  SOFOSBUVIR/LEDIPASVIR (HARVONI): - Harvoni tablet is taken daily with OR without food. - The tablets are orange. - The tablets should be stored at room temperature.  - Acid reducing agents such as H2 blockers (ie. Pepcid (famotidine), Zantac (ranitidine), Tagamet (cimetidine), Axid (nizatidine) and proton pump inhibitors (ie. Prilosec (omeprazole), Protonix (pantoprazole), Nexium (esomeprazole), or Aciphex (rabeprazole)) can decrease effectiveness of Harvoni. Do not take until you have discussed with a health care provider.    -Antacids that contain magnesium and/or aluminum hydroxide (ie. Milk of Magensia, Rolaids, Gaviscon, Maalox, Mylanta, an dArthritis Pain Formula)can reduce absorption of Harvoni, so take them at least 4 hours before or after Harvoni.  -Calcium carbonate (calcium supplements or antacids such as Tums, Caltrate,  Os-Cal)needs to be taken at least 4 hours hours before or after Harvoni.  -St. John's wort or any products that contain St. John's wort like some herbal supplements  Please inform the office prior to starting any of these medications.  - The common side effects associated with Harvoni include:      1. Fatigue      2. Headache      3. Nausea      4. Diarrhea      5. Insomnia  Please note that this only lists the most common side effects and is NOT a comprehensive list of the potential side effects of these medications. For more information, please review the drug information sheets that come with your medication package from the pharmacy.  ---------------------------------------------------------------- GENERAL HELPFUL HINTS ON HCV THERAPY: 1. Stay well-hydrated. 2. Notify the [facility] of any changes in your other over-the-counter/herbal or prescription medications. 3. If you miss a dose of your medication, take the missed dose as soon as you remember. Return to your regular time/dose schedule the next day.  4.  Do not stop taking your medications without first talking with your healthcare provider. 5.  You may take Tylenol (acetaminophen), as long as the dose is less than 2000 mg (OR no more than 4 tablets of the Tylenol Extra Strengths 500mg  tablet) in 24 hours. 6.  You will see our pharmacist-specialist within the first 2 weeks of starting your medication. 7.  You will need to obtain routine labs around week 4 and12 weeks after starting and then 3 to 6 months after finishing Harvoni.    Scharlene Gloss, [place] [facility] [place],  [place]  [address] [phone]

## 2015-11-14 NOTE — Progress Notes (Signed)
[facility]ious [facility]   CC: consideration for treatment for chronic hepatitis C  HPI:  +George [name] is a 65 y.o. male who presents for initial evaluation and management of chronic hepatitis C.  Patient tested positive earlier this year. Hepatitis C-associated risk factors present are: none. Patient denies intranasal drug use, IV drug abuse, sexual contact with person with liver disease, tattoos. Patient has had other studies performed. Results: hepatitis C RNA by PCR, result: positive. Patient has not had prior treatment for Hepatitis C. Patient does not have a past history of liver disease. Patient does not have a family history of liver disease. Patient does  have associated signs or symptoms related to liver disease.  Labs reviewed and confirm chronic hepatitis C with a positive viral load.   Records reviewed from nephrology and labs in [place]. He recently was evaluated for his renal insuffiency and biopsy with glomerulonephritis and labs then positive for hepatitis C.        Patient does not have documented immunity to Hepatitis A. Patient does not have documented immunity to Hepatitis B.    Review of Systems:   Constitutional: negative for fatigue Gastrointestinal: negative for diarrhea Musculoskeletal: negative for myalgias and arthralgias All other systems reviewed and are negative      Past Medical History  Diagnosis Date  . SVT (supraventricular tachycardia) ([place]) 05/22/2015  . GERD (gastroesophageal reflux disease)   . Panic attacks   . Basal cell carcinoma of right side of nose   . CHF (congestive heart failure) ([place])   . Berger's disease   . AAA (abdominal aortic aneurysm) ([place])     a. [place] 09/2015 - 3cm distal AAA.  Marland Kitchen ARF (acute renal failure) ([place]) 09/2015  . Shortness of breath dyspnea     Prior to Admission medications   Medication Sig Start Date End Date Taking? Authorizing Provider  aspirin EC 81 MG EC tablet Take 1 tablet (81 mg total) by  mouth daily. 05/23/15  Yes Erma Heritage, PA  furosemide (LASIX) 80 MG tablet Take 1.5 tablets (120 mg total) by mouth 2 (two) times daily. 10/16/15  Yes Scott Joylene Draft, PA-C  levalbuterol [facility] HFA) 45 MCG/ACT inhaler Inhale 1-2 puffs into the lungs every 4 (four) hours as needed for wheezing. 08/29/15  Yes Golden Circle, FNP  nystatin (MYCOSTATIN) 100000 UNIT/ML suspension Take 5 mLs by mouth 4 (four) times daily.   Yes Historical Provider, MD  potassium chloride (K-DUR) 10 MEQ tablet Take 10 mEq by mouth 3 (three) times daily.   Yes Historical Provider, MD  zolpidem (AMBIEN) 5 MG tablet Take 1 tablet (5 mg total) by mouth at bedtime as needed for sleep. 09/06/15  Yes Hoyt Koch, MD  Ledipasvir-Sofosbuvir (HARVONI) 90-400 MG TABS Take 1 tablet by mouth daily. 11/14/15   Thayer Headings, MD    No Known Allergies  Social History  Substance Use Topics  . Smoking status: Former Smoker -- 2.00 packs/day for 50 years    Types: Cigarettes    Quit date: 01/24/2015  . Smokeless tobacco: Never Used  . Alcohol Use: No    Family History  Problem Relation Age of Onset  . Heart disease Mother     Before age 24  . Heart failure Father   . Heart disease Father     After age 1  . Healthy Maternal Grandmother   . Healthy Maternal Grandfather   . Diabetes Paternal Grandmother   . Healthy  Paternal Grandfather   . Heart attack Mother   . Heart attack Father   . Hypertension Neg Hx   . Stroke Neg Hx   no cirrhosis   Objective:  Constitutional: in no apparent distress and alert,  Filed Vitals:   11/14/15 1036  BP: 153/85  Pulse: 97  Temp: 97.6 F (36.4 C)   Eyes: anicteric Cardiovascular: Cor RRR Respiratory: CTA B; normal respiratory effort Gastrointestinal: Bowel sounds are normal, liver is not enlarged, spleen is not enlarged Musculoskeletal: no pedal edema noted Skin: negatives: no rash; no porphyria cutanea tarda Lymphatic: no cervical  lymphadenopathy   Laboratory Genotype: No results found for: HCVGENOTYPE HCV viral load: No results found for: HCVQUANT Lab Results  Component Value Date   WBC 9.1 10/12/2015   HGB 9.9* 10/12/2015   HCT 31.4* 10/12/2015   MCV 83.3 10/12/2015   PLT 478* 10/12/2015    Lab Results  Component Value Date   CREATININE 1.19 10/16/2015   BUN 17 10/16/2015   NA 141 10/16/2015   K 4.1 10/16/2015   CL 102 10/16/2015   CO2 30 10/16/2015    Lab Results  Component Value Date   ALT 17 09/20/2015   AST 40 09/20/2015   ALKPHOS 56 09/20/2015     Labs and history reviewed and show CHILD-PUGH A  5-6 points: Child class A 7-9 points: Child class B 10-15 points: Child class C  Lab Results  Component Value Date   INR 1.15 09/26/2015   BILITOT 0.8 09/20/2015   ALBUMIN 2.2* 09/20/2015     Assessment: New Patient with Chronic Hepatitis C genotype 1a, untreated.  I discussed with the patient the lab findings that confirm chronic hepatitis C as well as the natural history and progression of disease including about 30% of people who develop cirrhosis of the liver if left untreated and once cirrhosis is established there is a 2-7% risk per year of liver cancer and liver failure.  I discussed the importance of treatment and benefits in reducing the risk, even if significant liver fibrosis exists.   Plan: 1) Patient counseled extensively on limiting acetaminophen to no more than 2 grams daily, avoidance of alcohol. 2) Transmission discussed with patient including sexual transmission, sharing razors and toothbrush.   3) Will need referral to gastroenterology if concern for cirrhosis 4) Will need referral for substance abuse counseling: No.; Further work up to include urine drug screen  No. 5) Will prescribe Harvoni for 12 weeks 6) Hepatitis A vaccine No. 7) Hepatitis B vaccine No.will check titers with next blood draw 8) Pneumovax vaccine if concern for cirrhosis 9) Further work up to include  liver staging with elastography 10) will follow up after starting medication

## 2015-11-20 ENCOUNTER — Other Ambulatory Visit: Payer: Self-pay | Admitting: Pharmacist Clinician (PhC)/ Clinical Pharmacy Specialist

## 2015-11-20 ENCOUNTER — Other Ambulatory Visit: Payer: Self-pay | Admitting: Internal Medicine

## 2015-11-20 MED ORDER — LEDIPASVIR-SOFOSBUVIR 90-400 MG PO TABS: 1.0000 | ORAL_TABLET | Freq: Every day | ORAL | Status: DC

## 2015-11-21 DIAGNOSIS — N049 Nephrotic syndrome with unspecified morphologic changes: Secondary | ICD-10-CM | POA: Diagnosis not present

## 2015-11-21 DIAGNOSIS — B370 Candidal stomatitis: Secondary | ICD-10-CM | POA: Diagnosis not present

## 2015-11-21 DIAGNOSIS — B182 Chronic viral hepatitis C: Secondary | ICD-10-CM | POA: Diagnosis not present

## 2015-11-21 DIAGNOSIS — N183 Chronic kidney disease, stage 3 (moderate): Secondary | ICD-10-CM | POA: Diagnosis not present

## 2015-11-21 DIAGNOSIS — N055 Unspecified nephritic syndrome with diffuse mesangiocapillary glomerulonephritis: Secondary | ICD-10-CM | POA: Diagnosis not present

## 2015-11-26 ENCOUNTER — Emergency Department (HOSPITAL_COMMUNITY)
Admission: EM | Admit: 2015-11-26 | Discharge: 2015-11-26 | Disposition: A | Discharge status: Home / Self Care | Payer: Medicare Other | Attending: Emergency Medicine | Admitting: Emergency Medicine

## 2015-11-26 ENCOUNTER — Encounter (HOSPITAL_COMMUNITY): Payer: Self-pay

## 2015-11-26 ENCOUNTER — Emergency Department (HOSPITAL_COMMUNITY): Payer: Medicare Other

## 2015-11-26 DIAGNOSIS — Z87891 Personal history of nicotine dependence: Secondary | ICD-10-CM | POA: Insufficient documentation

## 2015-11-26 DIAGNOSIS — F1020 Alcohol dependence, uncomplicated: Secondary | ICD-10-CM

## 2015-11-26 DIAGNOSIS — R42 Dizziness and giddiness: Secondary | ICD-10-CM | POA: Diagnosis not present

## 2015-11-26 DIAGNOSIS — Z7982 Long term (current) use of aspirin: Secondary | ICD-10-CM | POA: Insufficient documentation

## 2015-11-26 DIAGNOSIS — Z85828 Personal history of other malignant neoplasm of skin: Secondary | ICD-10-CM | POA: Insufficient documentation

## 2015-11-26 DIAGNOSIS — Z87448 Personal history of other diseases of urinary system: Secondary | ICD-10-CM | POA: Diagnosis not present

## 2015-11-26 DIAGNOSIS — F10229 Alcohol dependence with intoxication, unspecified: Secondary | ICD-10-CM | POA: Diagnosis not present

## 2015-11-26 DIAGNOSIS — Z8719 Personal history of other diseases of the digestive system: Secondary | ICD-10-CM | POA: Insufficient documentation

## 2015-11-26 DIAGNOSIS — Z79899 Other long term (current) drug therapy: Secondary | ICD-10-CM | POA: Insufficient documentation

## 2015-11-26 DIAGNOSIS — G478 Other sleep disorders: Secondary | ICD-10-CM | POA: Insufficient documentation

## 2015-11-26 DIAGNOSIS — I509 Heart failure, unspecified: Secondary | ICD-10-CM | POA: Diagnosis not present

## 2015-11-26 LAB — CBC WITH DIFFERENTIAL/PLATELET
BASOS ABS: 0.1 10*3/uL (ref 0.0–0.1)
Basophils Relative: 1 %
EOS PCT: 1 %
Eosinophils Absolute: 0 10*3/uL (ref 0.0–0.7)
HCT: 35.1 % — ABNORMAL LOW (ref 39.0–52.0)
Hemoglobin: 11.6 g/dL — ABNORMAL LOW (ref 13.0–17.0)
LYMPHS PCT: 16 %
Lymphs Abs: 1.1 10*3/uL (ref 0.7–4.0)
MCH: 27.6 pg (ref 26.0–34.0)
MCHC: 33 g/dL (ref 30.0–36.0)
MCV: 83.6 fL (ref 78.0–100.0)
Monocytes Absolute: 0.3 10*3/uL (ref 0.1–1.0)
Monocytes Relative: 4 %
Neutro Abs: 5.6 10*3/uL (ref 1.7–7.7)
Neutrophils Relative %: 78 %
PLATELETS: 315 10*3/uL (ref 150–400)
RBC: 4.2 MIL/uL — ABNORMAL LOW (ref 4.22–5.81)
RDW: 16.2 % — ABNORMAL HIGH (ref 11.5–15.5)
WBC: 7 10*3/uL (ref 4.0–10.5)

## 2015-11-26 LAB — COMPREHENSIVE METABOLIC PANEL
ALBUMIN: 3.7 g/dL (ref 3.5–5.0)
ALT: 29 U/L (ref 17–63)
ANION GAP: 12 (ref 5–15)
AST: 72 U/L — AB (ref 15–41)
Alkaline Phosphatase: 71 U/L (ref 38–126)
BUN: 21 mg/dL — AB (ref 6–20)
CHLORIDE: 103 mmol/L (ref 101–111)
CO2: 23 mmol/L (ref 22–32)
Calcium: 8.9 mg/dL (ref 8.9–10.3)
Creatinine, Ser: 1.19 mg/dL (ref 0.61–1.24)
GFR calc Af Amer: >60 mL/min (ref >60)
GFR calc non Af Amer: >60 mL/min (ref >60)
GLUCOSE: 86 mg/dL (ref 65–99)
POTASSIUM: 4.5 mmol/L (ref 3.5–5.1)
SODIUM: 138 mmol/L (ref 135–145)
TOTAL PROTEIN: 7 g/dL (ref 6.5–8.1)
Total Bilirubin: 0.4 mg/dL (ref 0.3–1.2)

## 2015-11-26 NOTE — ED Notes (Signed)
Per EMS, Pt, from home, presents wanting ETOH detox.  Pt reports that he drank a 5th this morning.  Pt admits to polysubstance abuse.  Denies SI/HI/AV.

## 2015-11-26 NOTE — Discharge Instructions (Signed)
Alcohol Use Disorder °Alcohol use disorder is a mental disorder. It is not a one-time incident of heavy drinking. Alcohol use disorder is the excessive and uncontrollable use of alcohol over time that leads to problems with functioning in one or more areas of daily living. People with this disorder risk harming themselves and others when they drink to excess. Alcohol use disorder also can cause other mental disorders, such as mood and anxiety disorders, and serious physical problems. People with alcohol use disorder often misuse other drugs.  °Alcohol use disorder is common and widespread. Some people with this disorder drink alcohol to cope with or escape from negative life events. Others drink to relieve chronic pain or symptoms of mental illness. People with a family history of alcohol use disorder are at higher risk of losing control and using alcohol to excess.  °Drinking too much alcohol can cause injury, accidents, and health problems. One drink can be too much when you are: °· Working. °· Pregnant or breastfeeding. °· Taking medicines. Ask your doctor. °· Driving or planning to drive. °SYMPTOMS  °Signs and symptoms of alcohol use disorder may include the following:  °· Consumption of alcohol in larger amounts or over a longer period of time than intended. °· Multiple unsuccessful attempts to cut down or control alcohol use.   °· A great deal of time spent obtaining alcohol, using alcohol, or recovering from the effects of alcohol (hangover). °· A strong desire or urge to use alcohol (cravings).   °· Continued use of alcohol despite problems at work, school, or home because of alcohol use.   °· Continued use of alcohol despite problems in relationships because of alcohol use. °· Continued use of alcohol in situations when it is physically hazardous, such as driving a car. °· Continued use of alcohol despite awareness of a physical or psychological problem that is likely related to alcohol use. Physical  problems related to alcohol use can involve the brain, heart, liver, stomach, and intestines. Psychological problems related to alcohol use include intoxication, depression, anxiety, psychosis, delirium, and dementia.   °· The need for increased amounts of alcohol to achieve the same desired effect, or a decreased effect from the consumption of the same amount of alcohol (tolerance). °· Withdrawal symptoms upon reducing or stopping alcohol use, or alcohol use to reduce or avoid withdrawal symptoms. Withdrawal symptoms include: °¨ Racing heart. °¨ Hand tremor. °¨ Difficulty sleeping. °¨ Nausea. °¨ Vomiting. °¨ Hallucinations. °¨ Restlessness. °¨ Seizures. °DIAGNOSIS °Alcohol use disorder is diagnosed through an assessment by your health care provider. Your health care provider may start by asking three or four questions to screen for excessive or problematic alcohol use. To confirm a diagnosis of alcohol use disorder, at least two symptoms must be present within a 12-month period. The severity of alcohol use disorder depends on the number of symptoms: °· Mild--two or three. °· Moderate--four or five. °· Severe--six or more. °Your health care provider may perform a physical exam or use results from lab tests to see if you have physical problems resulting from alcohol use. Your health care provider may refer you to a mental health professional for evaluation. °TREATMENT  °Some people with alcohol use disorder are able to reduce their alcohol use to low-risk levels. Some people with alcohol use disorder need to quit drinking alcohol. When necessary, mental health professionals with specialized training in substance use treatment can help. Your health care provider can help you decide how severe your alcohol use disorder is and what type of treatment you need.   The following forms of treatment are available:   Detoxification. Detoxification involves the use of prescription medicines to prevent alcohol withdrawal  symptoms in the first week after quitting. This is important for people with a history of symptoms of withdrawal and for heavy drinkers who are likely to have withdrawal symptoms. Alcohol withdrawal can be dangerous and, in severe cases, cause death. Detoxification is usually provided in a hospital or in-patient substance use treatment facility.  Counseling or talk therapy. Talk therapy is provided by substance use treatment counselors. It addresses the reasons people use alcohol and ways to keep them from drinking again. The goals of talk therapy are to help people with alcohol use disorder find healthy activities and ways to cope with life stress, to identify and avoid triggers for alcohol use, and to handle cravings, which can cause relapse.  Medicines.Different medicines can help treat alcohol use disorder through the following actions:  Decrease alcohol cravings.  Decrease the positive reward response felt from alcohol use.  Produce an uncomfortable physical reaction when alcohol is used (aversion therapy).  Support groups. Support groups are run by people who have quit drinking. They provide emotional support, advice, and guidance. These forms of treatment are often combined. Some people with alcohol use disorder benefit from intensive combination treatment provided by specialized substance use treatment centers. Both inpatient and outpatient treatment programs are available.   This information is not intended to replace advice given to you by your health care provider. Make sure you discuss any questions you have with your health care provider.   Document Released: 09/04/2004 Document Revised: 08/18/2014 Document Reviewed: 11/04/2012 Elsevier Interactive Patient Education 2016 [place] [facility] The [employer] 211 is a great source of information about community services available.  Access by dialing 2-1-1 from anywhere  in [place], or by website -  CustodianSupply.fi.   Other Local Resources (Updated 08/2015)  [facility]  Crisis Hotline, available 24 hours a day, 7 days a week: [place], [place]   [facility]  Crisis Hotline, available 24 hours a day, 7 days a week: [place], [place]  [facility]  Suicide Prevention Hotline, available 24 hours a day, 7 days a week: [place], [place], available 24 hours a day, 7 days a week: [place], [place] Access to [employer], available 24 hours a day, 7 days a week: 9725056323 All   [facility]  Crisis Hotline, available 24 hours a day, 7 days a week: 941-143-0451 All   Other Local Resources (Updated 08/2015)  Outpatient Counseling/ Substance Abuse Programs  Services     Address and Phone Number  ADS (Alcohol and Drug Services)   Options include Individual counseling, group counseling, intensive outpatient program (several hours a day, several days a week)  Offers depression assessments  Provides methadone maintenance program [phone] [address], [place], [place] partial hospitalization/day treatment and DUI/DWI programs  [employer], private insurance [phone] [phone] [place], [place] [address]  [place] include intensive outpatient program (several hours a day, several days a week), outpatient treatment, DUI/DWI services, family education  Also has some services specifically for [employer] transitional housing  [phone] [address] [place],  [place] [address]     [facility]  Accepts Medicare, private pay, and private insurance [phone] [address] [place], [place], [place] [address]  [facility]  Services include individual counseling, substance abuse intensive outpatient program (several hours a day, several days a week), day treatment  Blinda [place], Medicaid, private insurance [phone] [address], [place], [place] [address]  [facility]   Offers substance abuse intensive outpatient program (several hours a day, several days a week), partial hospitalization program [phone] [address] [place], [place] [address]  [phone] [address], [place] [address]  [phone] [place], [place] [address]  [phone], [place], [place] [address]  [facility]  Individual counseling only  Accepts private insurance only [phone] [address] [place], [place] [address]  [facility]: Methadone Clinic  Methadone maintenance program [phone] [address] [place], [place] [address]  [facility] providing substance abuse and mental health counseling  Accepts Medicaid, Medicare, private insurance  Offers sliding scale for uninsured [phone] [place], [place] in [name] individual counseling, and intensive in-home services [phone] [address], [place], [place] [address]  [facility] individual counseling, family counseling, group therapy, domestic violence counseling, consumer credit counseling  Accepts Medicare, Medicaid, private insurance  Offers sliding scale for uninsured [phone] [address] [place], [place] [address]  [phone] [facility], [address] [place], [place]  [facility]  Offers individual, family and group counseling  3 locations - [place], [place], and [place]  [address], [place] [address]  [address] [place], [place] [address]  [place], [place] [address]  [facility]    Offers psychiatric assessment, 8-week Intensive Outpatient Program (several hours a day, several times a week, daytime or evenings), early recovery group, family Program, medication management  Private pay or private insurance only [phone], or  [phone] [address] [place], [place] [address]  [facility]  Offers individual, couples and family counseling  Accepts Medicaid, private insurance, and sliding scale for uninsured [phone] [address] [place], [place] [address]  Launa Flight, MD  Individual counseling  Private insurance [phone] [place], [place] [address]  [facility]   Offers assessment, substance abuse treatment, and behavioral health treatment [phone] [address] [place], [place] [address]  [place]  Individual counseling  Accepts private insurance [phone] [place], [place] [address]  [facility]  Individual counseling  Accepts Medicare, private insurance [phone] [place], [place] [address]  [place]    Offers intensive outpatient program (several hours a day, several times a week)  Private pay, private insurance [place], [place]  Individual counseling  Medicare, private insurance [phone] [address] [place], [place] [address]  [facility]    Offers intensive outpatient program (several hours a day, several times a week) and partial hospitalization program [phone] [place], [place] [address]  Letta Moynahan, MD  Individual counseling [phone] [address] [place], [place] [address]   [facility]  Offers Christian counseling to individuals, couples, and families  Accepts  Medicare and private insurance; offers sliding scale for uninsured [phone] [place], [place] [address]  [facility]  Christian counseling [phone] [address] [place], [place] [address]  [facility] [employer] crisis counseling, individual counseling, group therapy, in-home therapy, domestic violence services, day treatment, DWI services, Conservation officer, nature (CST), Actuary (ACTT), substance abuse Intensive Outpatient Program (several hours a day, several times a week)  2 locations - [place] and [place], [place] [address]  [phone] [address] [place], [place] [address]  [place] counseling and group therapy  [employer], [place], [place] [phone] [address] [place], [place]  [facility]  Offers individual and family counseling  Offers LGBTQ services  Accepts private insurance and private pay [phone] [place], [place] [address]  [facility]    Offers individual counseling, group therapy, and outpatient detox  Accepts private insurance [phone] [place], [place] Medicare, private insurance [phone] [address] [place], [place] [address]  [employer]  Individual counseling  Accepts Medicare, private insurance [phone] [address] [place], [place] [address]  [facility] [place] substance abuse Intensive Outpatient Program (several hours a day, several times a week) [phone], or [phone] [place], [place]

## 2015-11-26 NOTE — ED Notes (Signed)
Pt reports that he has been bing drinking x "40 days."

## 2015-11-26 NOTE — ED Provider Notes (Addendum)
I was asked by Dr. Tyrone Nine to evaluate the patient, for sobriety. At this time, the patient alert, calm, cooperative, and states, "I have a hangover". He is now here with his brother and sister who understand the implications of his problem, and are anxious to help him. His sister has agreed to take the patient home to her place, with her husband, and support him as he tries again to recover. He relapsed with alcohol abuse, about 40 days ago. He is anxious to stop drinking again. One of his challenges that caused him to start drinking was insomnia. He has a primary care doctor who he can see for follow-up care. Patient expresses no other concerns or problems at this time. He is alert and lucid. He is goal-directed and cooperative.  Plan- discharge with family members, with strong encouragement to remain alcohol free, and seek care on the outpatient basis for alcoholism.  Daleen Bo, MD 11/26/15 1801  Daleen Bo, MD 11/26/15 [phone]

## 2015-11-26 NOTE — ED Notes (Signed)
Patient was alert, oriented and stable upon discharge. RN went over AVS and patient had no further questions.  

## 2015-11-27 NOTE — ED Provider Notes (Signed)
CSN: [id number]     Arrival date & time 11/26/15  1348 History   First MD Initiated Contact with Patient 11/26/15 1400     Chief Complaint  Patient presents with  . ETOH Detox      (Consider location/radiation/quality/duration/timing/severity/associated sxs/prior Treatment) Patient is a 65 y.o. male presenting with general illness. The history is provided by the patient and a relative.  Illness Severity:  Moderate Onset quality:  Gradual Duration:  2 days Timing:  Constant Progression:  Worsening Chronicity:  New Associated symptoms: no abdominal pain, no chest pain, no congestion, no diarrhea, no fever, no headaches, no myalgias, no rash, no shortness of breath and no vomiting     65 yo M with a cc of requesting detox.  Patient states that he quit drinking but then needed alcohol to sleep.  Went on a bender for last three days.  Family is concerned bc he recently saw a doctor and then got diagnosed with multiple medical problems.  Patient requesting detox, but really wants sleep medications.   Past Medical History  Diagnosis Date  . SVT (supraventricular tachycardia) ([place]) 05/22/2015  . GERD (gastroesophageal reflux disease)   . Panic attacks   . Basal cell carcinoma of right side of nose   . CHF (congestive heart failure) ([place])   . Berger's disease   . AAA (abdominal aortic aneurysm) ([place])     a. [place] 09/2015 - 3cm distal AAA.  Marland Kitchen ARF (acute renal failure) ([place]) 09/2015  . Shortness of breath dyspnea    Past Surgical History  Procedure Laterality Date  . Fracture surgery    . Tonsillectomy and adenoidectomy  ~ 1955  . Knee arthroscopy Right ~ 1985  . Femur fracture surgery Right 1970    "had plate & pin put in"  . Femur hardware removal Right 1971    "removed plate; left pin in  . Basal cell carcinoma excision Right ~ 06/2015    "side of my nose"  . Electrophysiologic study N/A 09/10/2015    Procedure: SVT Ablation;  Surgeon: Evans Lance, MD;  Location: [facility];  Service: Cardiovascular;  Laterality: N/A;   Family History  Problem Relation Age of Onset  . Heart disease Mother     Before age 2  . Heart failure Father   . Heart disease Father     After age 58  . Healthy Maternal Grandmother   . Healthy Maternal Grandfather   . Diabetes Paternal Grandmother   . Healthy Paternal Grandfather   . Heart attack Mother   . Heart attack Father   . Hypertension Neg Hx   . Stroke Neg Hx    Social History  Substance Use Topics  . Smoking status: Former Smoker -- 2.00 packs/day for 50 years    Types: Cigarettes    Quit date: 01/24/2015  . Smokeless tobacco: Never Used  . Alcohol Use: No    Review of Systems  Constitutional: Negative for fever and chills.  HENT: Negative for congestion and facial swelling.   Eyes: Negative for discharge and visual disturbance.  Respiratory: Negative for shortness of breath.   Cardiovascular: Negative for chest pain and palpitations.  Gastrointestinal: Negative for vomiting, abdominal pain and diarrhea.  Musculoskeletal: Negative for myalgias and arthralgias.  Skin: Negative for color change and rash.  Neurological: Negative for tremors, syncope and headaches.  Psychiatric/Behavioral: Positive for sleep disturbance. Negative for suicidal ideas, confusion and dysphoric mood.      Allergies  Review  of patient's allergies indicates no known allergies.  Home Medications   Prior to Admission medications   Medication Sig Start Date End Date Taking? Authorizing Provider  aspirin EC 81 MG EC tablet Take 1 tablet (81 mg total) by mouth daily. 05/23/15   Erma Heritage, PA  furosemide (LASIX) 80 MG tablet Take 1.5 tablets (120 mg total) by mouth 2 (two) times daily. 10/16/15   Scott T Weaver, PA-C  Ledipasvir-Sofosbuvir (HARVONI) 90-400 MG TABS Take 1 tablet by mouth daily. 11/20/15   Thayer Headings, MD  Ledipasvir-Sofosbuvir (HARVONI) 90-400 MG TABS Take 1 tablet by mouth daily. 11/20/15   Thayer Headings, MD   levalbuterol [name] HFA) 45 MCG/ACT inhaler Inhale 1-2 puffs into the lungs every 4 (four) hours as needed for wheezing. 08/29/15   Golden Circle, FNP  nystatin (MYCOSTATIN) 100000 UNIT/ML suspension Take 5 mLs by mouth 4 (four) times daily.    Historical Provider, MD  potassium chloride (K-DUR) 10 MEQ tablet Take 10 mEq by mouth 3 (three) times daily.    Historical Provider, MD  zolpidem (AMBIEN) 5 MG tablet Take 1 tablet (5 mg total) by mouth at bedtime as needed for sleep. 09/06/15   Hoyt Koch, MD   BP 126/75 mmHg  Pulse 70  Temp(Src) 97.5 F (36.4 C) (Oral)  Resp 16  SpO2 93% Physical Exam  Constitutional: He is oriented to person, place, and time. He appears well-developed and well-nourished.  HENT:  Head: Normocephalic and atraumatic.  Eyes: EOM are normal. Pupils are equal, round, and reactive to light.  Neck: Normal range of motion. Neck supple. No JVD present.  Cardiovascular: Normal rate and regular rhythm.  Exam reveals no gallop and no friction rub.   No murmur heard. Pulmonary/Chest: No respiratory distress. He has no wheezes.  Abdominal: He exhibits no distension. There is no rebound and no guarding.  Musculoskeletal: Normal range of motion.  Neurological: He is alert and oriented to person, place, and time.  Skin: No rash noted. No pallor.  Psychiatric: He has a normal mood and affect. His behavior is normal.  Nursing note and vitals reviewed.   ED Course  Procedures (including critical care time) Labs Review Labs Reviewed  COMPREHENSIVE METABOLIC PANEL - Abnormal; Notable for the following:    BUN 21 (*)    AST 72 (*)    All other components within normal limits  CBC WITH DIFFERENTIAL/PLATELET - Abnormal; Notable for the following:    RBC 4.20 (*)    Hemoglobin 11.6 (*)    HCT 35.1 (*)    RDW 16.2 (*)    All other components within normal limits    Imaging Review Ct Head Wo Contrast  11/26/2015  CLINICAL DATA:  Altered mental status with  alcohol abuse. Dizziness. Recent falls. EXAM: CT HEAD WITHOUT CONTRAST TECHNIQUE: Contiguous axial images were obtained from the base of the skull through the vertex without intravenous contrast. COMPARISON:  None. FINDINGS: The ventricles are normal in size and configuration. There is no intracranial mass, hemorrhage, extra-axial fluid collection, or midline shift. There is mild small vessel disease in the centra semiovale bilaterally. Elsewhere gray-white compartments appear normal. No acute infarct evident. The bony calvarium appears intact. Mastoids on the right are clear. There is opacification of multiple mastoid air cells on the left. There are no intraorbital lesions. Paranasal sinuses are clear. There is debris in each external auditory canal. IMPRESSION: Mild periventricular small vessel disease. No acute infarct. No hemorrhage or mass effect.  Opacification of multiple mastoid air cells on the left without air-fluid levels. Mastoids on the right are clear. There is probable cerumen in each external auditory canal. Electronically Signed   By: Lowella Grip III M.D.   On: 11/26/2015 15:14   I have personally reviewed and evaluated these images and lab results as part of my medical decision-making.   EKG Interpretation None      MDM   Final diagnoses:  Alcoholism [facility])    65 yo M With a chief complaint of alcoholism. Patient is here to try and quit drinking. Discussed the patient this is no longer offered as an inpatient service. He is offered resources in the community. Family is concerned with his recent medical issues and wish to have a set of labs drawn. Patient is so intoxicated that he is unable to walk currently. Will obtain a set of labs evaluate until sober.  Turned over to Dr. Eulis Foster.  The patients results and plan were reviewed and discussed.   Any x-rays performed were independently reviewed by myself.   Differential diagnosis were considered with the presenting  HPI.  Medications - No data to display  Filed Vitals:   11/26/15 1359 11/26/15 1400  BP: 126/75 126/75  Pulse: 70 70  Temp: 97.5 F (36.4 C)   TempSrc: Oral   Resp: 16   SpO2: 93%     Final diagnoses:  Alcoholism ([place])         Deno Etienne, DO 11/27/15 [id number]

## 2015-12-17 ENCOUNTER — Telehealth: Payer: Self-pay | Admitting: *Deleted

## 2015-12-17 NOTE — Telephone Encounter (Signed)
I spoke with patient to follow-up for 3 month phone call for [email or web address]  Study on 12/13/15.Patient has been re-hospitalized from 09/20/15 to 09/30/15 for congestive heart failure. i thanked patient for his participation in the study.

## 2015-12-19 ENCOUNTER — Ambulatory Visit (HOSPITAL_COMMUNITY)
Admission: RE | Admit: 2015-12-19 | Discharge: 2015-12-19 | Disposition: A | Discharge status: Home / Self Care | Payer: Medicare Other | Source: Ambulatory Visit | Attending: Internal Medicine | Admitting: Internal Medicine

## 2015-12-19 ENCOUNTER — Other Ambulatory Visit: Payer: Self-pay | Admitting: Pharmacist Clinician (PhC)/ Clinical Pharmacy Specialist

## 2015-12-19 DIAGNOSIS — B182 Chronic viral hepatitis C: Secondary | ICD-10-CM | POA: Diagnosis not present

## 2015-12-19 DIAGNOSIS — J90 Pleural effusion, not elsewhere classified: Secondary | ICD-10-CM | POA: Diagnosis not present

## 2015-12-19 DIAGNOSIS — I714 Abdominal aortic aneurysm, without rupture: Secondary | ICD-10-CM | POA: Insufficient documentation

## 2015-12-31 ENCOUNTER — Encounter: Payer: Self-pay | Admitting: Pharmacy Technician

## 2016-01-16 ENCOUNTER — Ambulatory Visit (INDEPENDENT_AMBULATORY_CARE_PROVIDER_SITE_OTHER): Payer: Medicare Other | Admitting: Pharmacist

## 2016-01-16 DIAGNOSIS — J441 Chronic obstructive pulmonary disease with (acute) exacerbation: Secondary | ICD-10-CM

## 2016-01-16 DIAGNOSIS — B182 Chronic viral hepatitis C: Secondary | ICD-10-CM

## 2016-01-16 NOTE — Patient Instructions (Signed)
Schedule for lab in 2 weeks Schedule for Dr Linus Salmons in August

## 2016-01-16 NOTE — Progress Notes (Signed)
HPI: George [name] is a 65 y.o. male presenting for chronic Hep C management.  Started Harvoni 12/31/15. Denies missed doses and takes at 6 am.   States he has SOB/fatigue at baseline but it makes both of them somewhat worse and likely due to uncontrolled COPD. He states he had a 7 day sample of Dulera in the past but is currently using Xopenex PRN without relief. He is waiting on October 15th 2017 to enroll in Medicare Part D.   He states he is not currently drinking EtOH and has not had any since his ED visit for EtOH detox in 11/2015. He states he does not want extra help from the clinic to help as he has been successful in avoiding EtOH by himself.   States he takes BC powder daily PRN body aches and occasional baking soda with water for acid reflux.   Lab Results  Component Value Date   HEPCGENOTYPE 1a 09/25/2015    Allergies: No Known Allergies  Vitals:    Past Medical History: Past Medical History  Diagnosis Date  . SVT (supraventricular tachycardia) ([place]) 05/22/2015  . GERD (gastroesophageal reflux disease)   . Panic attacks   . Basal cell carcinoma of right side of nose   . CHF (congestive heart failure) ([place])   . Berger's disease   . AAA (abdominal aortic aneurysm) ([place])     a. [place] 09/2015 - 3cm distal AAA.  Marland Kitchen ARF (acute renal failure) ([place]) 09/2015  . Shortness of breath dyspnea     Social History: Social History   Social History  . Marital Status: Divorced    Spouse Name: N/A  . Number of Children: 0  . Years of Education: 12   Occupational History  . Retired    Social History Main Topics  . Smoking status: Former Smoker -- 2.00 packs/day for 50 years    Types: Cigarettes    Quit date: 01/24/2015  . Smokeless tobacco: Never Used  . Alcohol Use: No  . Drug Use: No  . Sexual Activity: Not Currently   Other Topics Concern  . Not on file   Social History Narrative   Fun: Trade out work, outdoors   Denies religious beliefs effecting health care.      Labs: HEPATITIS B SURFACE AG (no units)  Date Value  09/23/2015 Negative   HCV AB (s/co ratio)  Date Value  09/23/2015 >11.0*    Lab Results  Component Value Date   HEPCGENOTYPE 1a 09/25/2015    No flowsheet data found.  AST (U/L)  Date Value  11/26/2015 72*  09/20/2015 40  08/29/2015 23   ALT (U/L)  Date Value  11/26/2015 29  09/20/2015 17  08/29/2015 25   INR (no units)  Date Value  09/26/2015 1.15  09/20/2015 1.08  08/22/2015 1.16    CrCl: CrCl cannot be calculated (Unknown ideal weight.).  Fibrosis Score: ULTRASOUND HEPATIC ELASTOGRAPHY  Corresponding Metavir fibrosis score:  F2 and some F3  Child-Pugh Score: N/A  Previous Treatment Regimen: N/A  Assessment: Chronic hepatitis C: Started on Harvoni on 12/31/15 for 12 weeks.  Ultrasound Elastography shows fibrosis score F2 and some F3. Pt denies current alcohol use.  COPD: Currently uncontrolled with frequent SOB throughout the day and night.   Recommendations: Continue Harvoni 1 tablet by mouth daily x 12 weeks Follow-up lab in 2 weeks (HCV RNA, Hep B Surface antibody) Start Hep B and Hep A immunizations at next visit Instructed pt to call primary care physician for  followup and [facility] prescription. Provided with free Dulera 30-day card.  Instructed to limit BC powders for pain and to separate his baking soda powders from his Harvoni.  Followup with Dr Linus Salmons in August.   Kem Parkinson, [place].D. Pharmacy Resident  01/16/2016, 10:13 AM

## 2016-01-30 ENCOUNTER — Other Ambulatory Visit: Payer: Medicare Other

## 2016-01-30 DIAGNOSIS — B182 Chronic viral hepatitis C: Secondary | ICD-10-CM

## 2016-01-31 LAB — HEPATITIS B SURFACE ANTIBODY,QUALITATIVE: HEP B S AB: NEGATIVE

## 2016-01-31 LAB — HEPATITIS C RNA QUANTITATIVE: HCV Quantitative: NOT DETECTED IU/mL (ref <15)

## 2016-02-06 ENCOUNTER — Ambulatory Visit (INDEPENDENT_AMBULATORY_CARE_PROVIDER_SITE_OTHER): Payer: Medicare Other | Admitting: Cardiovascular Disease

## 2016-02-06 ENCOUNTER — Encounter: Payer: Self-pay | Admitting: Cardiovascular Disease

## 2016-02-06 VITALS — BP 155/86 | HR 95 | Ht 73.0 in | Wt 221.0 lb

## 2016-02-06 DIAGNOSIS — I10 Essential (primary) hypertension: Secondary | ICD-10-CM | POA: Diagnosis not present

## 2016-02-06 DIAGNOSIS — I471 Supraventricular tachycardia: Secondary | ICD-10-CM

## 2016-02-06 NOTE — Progress Notes (Signed)
02/06/2016 George [name]   1951-01-04  [id number]  Primary Physician [name], [place] Primary Cardiologist: Lorretta Harp MD Renae Gloss  HPI:  George [name] is a 65 year old thin-appearing divorced Caucasian male with no children is retired from doing [employer] work. I last saw him in the office 07/11/15. He was recently seen at [facility] after he was admitted with PSVT and subsequent chest pain converting with intravenous adenosine. He had mildly elevated troponins which led to a Myoview stress test which was low risk. A CT scan angiogram  showed no evidence of pulmonary emboli but did show a 5 mm nodule which will need to be followed by repeat CT scanning to rule out malignancy given his tobacco abuse history. 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 Lovena Le in January with subsequent improvement in his ejection fraction by 2-D echocardiography. He also has been treated for chronic hepatitis C with Harvoni. Over the last several weeks he's had increasing shortness of breath with PND and orthopnea. He has had a temperature weight gain with 2-  3+ pitting edema. He does admit to taking his diuretics on an every other day basis not how they were prescribed. Also admits to dietary indiscretion with regards to salt   Current Outpatient Prescriptions  Medication Sig Dispense Refill  . aspirin EC 81 MG EC tablet Take 1 tablet (81 mg total) by mouth daily.    . Aspirin-Salicylamide-Caffeine (BC HEADACHE POWDER PO) Take 1 Package by mouth daily as needed (Body Aches).    . furosemide (LASIX) 80 MG tablet Take 1.5 tablets (120 mg total) by mouth 2 (two) times daily. 90 tablet 6  . Ledipasvir-Sofosbuvir (HARVONI) 90-400 MG TABS Take 1 tablet by mouth daily. 28 tablet 2  . levalbuterol  (XOPENEX HFA) 45 MCG/ACT inhaler Inhale 1-2 puffs into the lungs every 4 (four) hours as needed for wheezing. 1 Inhaler 12  . potassium chloride (K-DUR) 10 MEQ tablet Take 10 mEq by mouth 3 (three) times daily. Reported on 01/16/2016    . zolpidem (AMBIEN) 5 MG tablet Take 1 tablet (5 mg total) by mouth at bedtime as needed for sleep. 15 tablet 1   No current facility-administered medications for this visit.    No Known Allergies  Social History   Social History  . Marital Status: Divorced    Spouse Name: N/A  . Number of Children: 0  . Years of Education: 12   Occupational History  . Retired    Social History Main Topics  . Smoking status: Former Smoker -- 2.00 packs/day for 50 years    Types: Cigarettes    Quit date: 01/24/2015  . Smokeless tobacco: Never Used  . Alcohol Use: No  . Drug Use: No  . Sexual Activity: Not Currently   Other Topics Concern  . Not on file   Social History Narrative   Fun: Trade out work, outdoors   Denies religious beliefs effecting health care.      Review of Systems: General: negative for chills, fever, night sweats or weight changes.  Cardiovascular: negative for chest pain, dyspnea on exertion, edema, orthopnea, palpitations, paroxysmal nocturnal dyspnea or shortness of breath Dermatological: negative for rash Respiratory: negative for cough or wheezing Urologic: negative for hematuria Abdominal: negative for nausea, vomiting, diarrhea,  bright red blood per rectum, melena, or hematemesis Neurologic: negative for visual changes, syncope, or dizziness All other systems reviewed and are otherwise negative except as noted above.    Blood pressure 155/86, pulse 95, height 6\' 1"  (1.854 m), weight 221 lb (100.245 kg).  General appearance: alert and no distress Neck: no adenopathy, no carotid bruit, no JVD, supple, symmetrical, trachea midline and thyroid not enlarged, symmetric, no tenderness/mass/nodules Lungs: clear to auscultation  bilaterally Heart: To 3+ pitting edema bilateral lower extremities Extremities: 2-3+ pitting edema bilateral lower extremities  EKG not performed today  ASSESSMENT AND PLAN:   SVT (supraventricular tachycardia) (HCC) History of SVT converted with adenosine status post AVNRT ablation by Dr. Lovena Le 09/10/15. His ejection fraction actually improved from the mid 40s to 50 at 55% by 2-D echo performed 09/21/15. He has had no further episodes of PSVT.  Essential hypertension History of hypertension blood pressure measures at 155/86. He is not on antihypertensive medications      Lorretta Harp MD [facility], [facility] 02/06/2016 12:04 PM

## 2016-02-06 NOTE — Patient Instructions (Addendum)
Medication Instructions:  Your physician recommends that you continue on your current medications as directed. Please refer to the Current Medication list given to you today.    Follow-Up: We request that you follow-up in: [place] and in 6 MONTHS with Dr Andria Rhein will receive a reminder letter in the mail two months in advance. If you don't receive a letter, please call our office to schedule the follow-up appointment.   If you need a refill on your cardiac medications before your next appointment, please call your pharmacy.

## 2016-02-06 NOTE — Assessment & Plan Note (Signed)
History of hypertension blood pressure measures at 155/86. He is not on antihypertensive medications

## 2016-02-06 NOTE — Assessment & Plan Note (Signed)
History of SVT converted with adenosine status post AVNRT ablation by Dr. Lovena Le 09/10/15. His ejection fraction actually improved from the mid 40s to 50 at 55% by 2-D echo performed 09/21/15. He has had no further episodes of PSVT.

## 2016-02-21 NOTE — Progress Notes (Signed)
Cardiology Office Note:    Date:  02/22/2016   ID:  George [name], DOB 26-Mar-1951, MRN [id number]  PCP:  [name], [place]  Cardiologist: Dr. Quay Burow  Electrophysiologist: Dr. Cristopher Peru  Nephrologist - Dr. Florene Glen.  Referring MD: Golden Circle, FNP   Chief Complaint  Patient presents with  . Follow-up    volume overload    History of Present Illness:     George [name] is a 65 y.o. male with a hx of PSVT, COPD, HTN, chronic LE extremity edema from venous insufficiency with assoc venous stasis ulcers, Berger's disease. He was admitted in 10/16 with SVT and elevated Troponin levels. Myoview was low risk at that time. He was admitted 1/17 with AECOPD. He had recurrent SVT on the floor that converted with IV Adenosine. Echo at that time demonstrated EF 40-45% with diffuse HK. 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 Cristopher Peru. He required IV Lasix prior to DC due to worsening dyspnea.   Admitted in 2/17 with acute respiratory failure, anasarca in the setting of nephrotic syndrome. Pulmonary edema was noted on CXR. Protein > 300 on UA. 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 He was followed by nephrology and maintained on high-dose diuretics.  He was seen by Dr. [name] 02/06/16.  It appears that he was volume overloaded. He returns for close follow-up.  He currently takes Lasix 120 mg twice a day. His weight is up 7 pounds since last seen. He notes orthopnea and PND. He has difficulty sleeping. He notes dyspnea with minimal activity. LE edema has increased. He denies chest pain or syncope.   Past Medical History  Diagnosis Date  . SVT (supraventricular tachycardia) ([place]) 05/22/2015  . GERD (gastroesophageal reflux disease)   . Panic attacks   . Basal cell carcinoma of right side of nose   . Nephrotic syndrome   . Berger's disease   . AAA (abdominal aortic aneurysm) ([place])     a. [place] 09/2015 - 3cm distal AAA. //  b. [place] 5/17: 3.1 cm   . CKD  (chronic kidney disease) 09/2015    Past Surgical History  Procedure Laterality Date  . Fracture surgery    . Tonsillectomy and adenoidectomy  ~ 1955  . Knee arthroscopy Right ~ 1985  . Femur fracture surgery Right 1970    "had plate & pin put in"  . Femur hardware removal Right 1971    "removed plate; left pin in  . Basal cell carcinoma excision Right ~ 06/2015    "side of my nose"  . Electrophysiologic study N/A 09/10/2015    Procedure: SVT Ablation;  Surgeon: Evans Lance, MD;  Location: [facility];  Service: Cardiovascular;  Laterality: N/A;    Current Medications: Outpatient Prescriptions Prior to Visit  Medication Sig Dispense Refill  . aspirin EC 81 MG EC tablet Take 1 tablet (81 mg total) by mouth daily.    . Aspirin-Salicylamide-Caffeine (BC HEADACHE POWDER PO) Take 1 Package by mouth daily as needed (Body Aches).    . Ledipasvir-Sofosbuvir (HARVONI) 90-400 MG TABS Take 1 tablet by mouth daily. 28 tablet 2  . levalbuterol (XOPENEX HFA) 45 MCG/ACT inhaler Inhale 1-2 puffs into the lungs every 4 (four) hours as needed for wheezing. 1 Inhaler 12  . zolpidem (AMBIEN) 5 MG tablet Take 1 tablet (5 mg total) by mouth at bedtime as needed for sleep. 15 tablet 1  . furosemide (LASIX) 80 MG tablet Take 1.5 tablets (120 mg total)  by mouth 2 (two) times daily. 90 tablet 6  . potassium chloride (K-DUR) 10 MEQ tablet Take 10 mEq by mouth 3 (three) times daily. Reported on 01/16/2016     No facility-administered medications prior to visit.      Allergies:   Review of patient's allergies indicates no known allergies.   Social History   Social History  . Marital Status: Divorced    Spouse Name: N/A  . Number of Children: 0  . Years of Education: 12   Occupational History  . Retired    Social History Main Topics  . Smoking status: Former Smoker -- 2.00 packs/day for 50 years    Types: Cigarettes    Quit date: 01/24/2015  . Smokeless tobacco: Never Used  . Alcohol Use:  No  . Drug Use: No  . Sexual Activity: Not Currently   Other Topics Concern  . None   Social History Narrative   Fun: Trade out work, outdoors   Denies religious beliefs effecting health care.      Family History:  The patient's family history includes Diabetes in his paternal grandmother; Healthy in his maternal grandfather, maternal grandmother, and paternal grandfather; Heart attack in his father and mother; Heart disease in his father and mother; Heart failure in his father. There is no history of Hypertension or Stroke.   ROS:   Please see the history of present illness.    Review of Systems  Cardiovascular: Positive for leg swelling.  Respiratory: Positive for shortness of breath.    All other systems reviewed and are negative.   Physical Exam:    VS:  BP 148/102 mmHg  Pulse 95  Ht 6\' 1"  (1.854 m)  Wt 227 lb (102.967 kg)  BMI 29.96 kg/m2  SpO2 99%   Physical Exam  Constitutional: He is oriented to person, place, and time. He appears well-developed and well-nourished. No distress.  HENT:  Head: Normocephalic and atraumatic.  Neck: No JVD present.  Cardiovascular: Normal rate, regular rhythm and normal heart sounds.   No murmur heard. Pulmonary/Chest: He has no wheezes.  Decreased breath sounds with bibasilar crackles, R > L  Abdominal: Soft. There is no tenderness.  Musculoskeletal:  1-2+ bilateral LE edema 1+ presacral edema  Neurological: He is alert and oriented to person, place, and time.  Skin: Skin is warm and dry.  Psychiatric: He has a normal mood and affect.    Wt Readings from Last 3 Encounters:  02/22/16 227 lb (102.967 kg)  02/06/16 221 lb (100.245 kg)  11/14/15 212 lb (96.163 kg)     Studies/Labs Reviewed:     EKG:  EKG is not ordered today.  The ekg ordered today demonstrates n/a  Recent Labs: 08/29/2015: Pro B Natriuretic peptide (BNP) 894.0* 09/20/2015: Magnesium 2.0; TSH 4.860* 10/16/2015: Brain Natriuretic Peptide 527.7* 11/26/2015: ALT  29; BUN 21*; Creatinine, Ser 1.19; Hemoglobin 11.6*; Platelets 315; Potassium 4.5; Sodium 138   Recent Lipid Panel    Component Value Date/Time   CHOL 187 08/29/2015 1554   TRIG 146.0 08/29/2015 1554   HDL 41.80 08/29/2015 1554   CHOLHDL 4 08/29/2015 1554   VLDL 29.2 08/29/2015 1554   LDLCALC 116* 08/29/2015 1554    Additional studies/ records that were reviewed today include:   Abdominal US 12/19/15 Abdominal aorta: Measures up to 3.1 cm  Echo 09/21/15 EF 50-55%, mild LAE  Echo 08/22/15 Mod LVH, EF 40-45%, diff HK, mild MR  Myoview 10/16 1. Inferior wall attenuation on stress and rest imaging  probably due to diaphragmatic attenuation given the lack of abnormal wall motion in this vicinity. Scarring of the inferior wall is a differential diagnostic consideration. No inducible ischemia. 2. Normal left ventricular wall motion. 3. Left ventricular ejection fraction 65% 4. Low-risk stress test findings*.   ASSESSMENT:     1. Nephrotic syndrome   2. Anasarca   3. Essential hypertension   4. SVT (supraventricular tachycardia) ([place])   5. CKD (chronic kidney disease), stage 3 (moderate)     PLAN:     In order of problems listed above:  1. Nephrotic syndrome - This has been felt to be the factor contributing to volume excess and was thoroughly evaluated in the hospital earlier this year.  He had renal bx with noted findings of Glomerulonephritis. Last echo in the hospital demonstrated normal LVEF. It was NOT thought that diastolic dysfunction was the cause of his volume excess.  He presents for follow-up today with evidence of recurrent anasarca. He needs further diuresis and follow-up with nephrology.  2. Anasarca - Increase diuresis at home.  Will arrange early FU with Nephrology.  -  Increase Lasix to 160 mg twice a day  -  Increase potassium to 20 mEq twice a day  -  I will give him 2 doses of metolazone 2.5 mg  -  CMET today, BMET in one week  -  CXR today  -  Arrange  early follow-up with nephrology  -  Follow-up here in 2-3 weeks with me  3. HTN - Blood pressure uncontrolled. Adjust diuretics first. He was previously on Bystolic. Consider resuming this if blood pressure remains elevated at follow-up.  4. PSVT - s/p RFCA.    5. CKD - FU with Nephrology soon.  Will keep a close eye on renal function and K+ with adjustments in diuretics.    Total time spent with patient today 40  minutes. This includes reviewing records, evaluating the patient and coordinating care. Face-to-face time >50%.    Medication Adjustments/Labs and Tests Ordered: Current medicines are reviewed at length with the patient today.  Concerns regarding medicines are outlined above.  Medication changes, Labs and Tests ordered today are outlined in the Patient Instructions noted below. Patient Instructions  Medication Instructions:  1. INCREASE LASIX TO 160 MG TWICE DAILY 2. INCREASE POTASSIUM TO 20 MEQ TWICE DAILY 3. TAKE METOLAZONE 2.5 MG TODAY AND AGAIN ON Sunday 02/24/16 ONLY ; YOU WILL NEED TO MAKE SURE TO TAKE THIS 30 MINUTES BEFORE YOUR MORNING DOSE OF LASIX  4. YOU WILL NEED TO TAKE EXTRA POTASSIUM 20 MEQ TODAY AND AGAIN ON Sunday 02/24/16 WHEN YOU TAKE THE METOLAZONE  Labwork: Today - CMET 1 week - BMET Testing/Procedures: None Follow-Up: We will try to get you in to see Dr. Florene Glen sooner. Richardson Dopp, PA-C in 2-3 weeks. Any Other Special Instructions Will Be Listed Below (If Applicable). If your breathing gets worse or your weight increases by 3 more pounds, go to the Emergency Room at [facility]. If you need a refill on your cardiac medications before your next appointment, please call your pharmacy.    Signed, Richardson Dopp, PA-C  02/22/2016 11:52 AM    [facility] [address], [place], [place]  [address] Phone: [phone]; Fax: [phone]

## 2016-02-22 ENCOUNTER — Telehealth: Payer: Self-pay | Admitting: *Deleted

## 2016-02-22 ENCOUNTER — Ambulatory Visit (INDEPENDENT_AMBULATORY_CARE_PROVIDER_SITE_OTHER): Payer: Medicare Other | Admitting: Physician Assistant

## 2016-02-22 ENCOUNTER — Encounter: Payer: Self-pay | Admitting: Physician Assistant

## 2016-02-22 VITALS — BP 148/102 | HR 95 | Ht 73.0 in | Wt 227.0 lb

## 2016-02-22 DIAGNOSIS — I10 Essential (primary) hypertension: Secondary | ICD-10-CM

## 2016-02-22 DIAGNOSIS — N049 Nephrotic syndrome with unspecified morphologic changes: Secondary | ICD-10-CM | POA: Diagnosis not present

## 2016-02-22 DIAGNOSIS — R601 Generalized edema: Secondary | ICD-10-CM

## 2016-02-22 DIAGNOSIS — N183 Chronic kidney disease, stage 3 (moderate): Secondary | ICD-10-CM | POA: Diagnosis not present

## 2016-02-22 DIAGNOSIS — I5022 Chronic systolic (congestive) heart failure: Secondary | ICD-10-CM | POA: Diagnosis not present

## 2016-02-22 DIAGNOSIS — I471 Supraventricular tachycardia: Secondary | ICD-10-CM | POA: Diagnosis not present

## 2016-02-22 LAB — COMPREHENSIVE METABOLIC PANEL
ALK PHOS: 58 U/L (ref 40–115)
ALT: 8 U/L — AB (ref 9–46)
AST: 23 U/L (ref 10–35)
Albumin: 3.2 g/dL — ABNORMAL LOW (ref 3.6–5.1)
BILIRUBIN TOTAL: 0.8 mg/dL (ref 0.2–1.2)
BUN: 17 mg/dL (ref 7–25)
CO2: 26 mmol/L (ref 20–31)
Calcium: 8.4 mg/dL — ABNORMAL LOW (ref 8.6–10.3)
Chloride: 100 mmol/L (ref 98–110)
Creat: 1.12 mg/dL (ref 0.70–1.25)
GLUCOSE: 90 mg/dL (ref 65–99)
Potassium: 4.3 mmol/L (ref 3.5–5.3)
SODIUM: 139 mmol/L (ref 135–146)
TOTAL PROTEIN: 6 g/dL — AB (ref 6.1–8.1)

## 2016-02-22 MED ORDER — FUROSEMIDE 80 MG PO TABS: 160.0000 mg | ORAL_TABLET | Freq: Two times a day (BID) | ORAL | Status: DC

## 2016-02-22 MED ORDER — METOLAZONE 2.5 MG PO TABS: 2.5000 mg | ORAL_TABLET | ORAL | Status: DC

## 2016-02-22 MED ORDER — POTASSIUM CHLORIDE ER 20 MEQ PO TBCR: 20.0000 meq | EXTENDED_RELEASE_TABLET | Freq: Two times a day (BID) | ORAL | Status: DC

## 2016-02-22 NOTE — Telephone Encounter (Signed)
Pt notified of lab results and findings by phone with verbal understanding. Results to be faxed to Dr. Florene Glen Nephrology. Pt advised at this time keep his 03/25/16 appt with Dr. Florene Glen. Pt agreeble to plan of care.

## 2016-02-22 NOTE — Telephone Encounter (Signed)
S/w Dr. Abel Presto nurse Jeani Hawking about getting pt a sooner appt next week to see Dr. Florene Glen due to Nephrotic Syndrome per Richardson Dopp, PA. Jeani Hawking, stated Dr. Florene Glen did not have anything sooner. Jeani Hawking stated Dr. Florene Glen  asked  If Richardson Dopp, PA would be able to handle pt's fluid volume right now. I stated to [place] that Brynda Rim. PA has done this as of today and that I will fax ov note from today w/med changes today for Dr. Abel Presto review. Richardson Dopp, [place] stated he would like to speak with Dr. Florene Glen in regards to pt as well.

## 2016-02-22 NOTE — Patient Instructions (Addendum)
Medication Instructions:  1. INCREASE LASIX TO 160 MG TWICE DAILY 2. INCREASE POTASSIUM TO 20 MEQ TWICE DAILY 3. TAKE METOLAZONE 2.5 MG TODAY AND AGAIN ON Sunday 02/24/16 ONLY ; YOU WILL NEED TO MAKE SURE TO TAKE THIS 30 MINUTES BEFORE YOUR MORNING DOSE OF LASIX  4. YOU WILL NEED TO TAKE EXTRA POTASSIUM 20 MEQ TODAY AND AGAIN ON Sunday 02/24/16 WHEN YOU TAKE THE METOLAZONE  Labwork: Today - CMET 1 week - BMET Testing/Procedures: None Follow-Up: We will try to get you in to see Dr. Florene Glen sooner. Richardson Dopp, PA-C in 2-3 weeks. Any Other Special Instructions Will Be Listed Below (If Applicable). If your breathing gets worse or your weight increases by 3 more pounds, go to the Emergency Room at [facility]. If you need a refill on your cardiac medications before your next appointment, please call your pharmacy.

## 2016-02-29 ENCOUNTER — Other Ambulatory Visit (INDEPENDENT_AMBULATORY_CARE_PROVIDER_SITE_OTHER): Payer: Medicare Other | Admitting: *Deleted

## 2016-02-29 DIAGNOSIS — N183 Chronic kidney disease, stage 3 (moderate): Secondary | ICD-10-CM | POA: Diagnosis not present

## 2016-02-29 LAB — BASIC METABOLIC PANEL
BUN: 20 mg/dL (ref 7–25)
CALCIUM: 8.5 mg/dL — AB (ref 8.6–10.3)
CO2: 32 mmol/L — AB (ref 20–31)
CREATININE: 1.19 mg/dL (ref 0.70–1.25)
Chloride: 99 mmol/L (ref 98–110)
GLUCOSE: 94 mg/dL (ref 65–99)
Potassium: 5 mmol/L (ref 3.5–5.3)
SODIUM: 141 mmol/L (ref 135–146)

## 2016-02-29 NOTE — Addendum Note (Signed)
Addended by: Eulis Foster on: 02/29/2016 09:05 AM   Modules accepted: Orders

## 2016-03-03 ENCOUNTER — Ambulatory Visit (INDEPENDENT_AMBULATORY_CARE_PROVIDER_SITE_OTHER): Payer: Medicare Other | Admitting: Family Medicine

## 2016-03-03 ENCOUNTER — Encounter: Payer: Self-pay | Admitting: Family Medicine

## 2016-03-03 VITALS — BP 127/84 | HR 87 | Ht 73.75 in | Wt 192.3 lb

## 2016-03-03 DIAGNOSIS — F411 Generalized anxiety disorder: Secondary | ICD-10-CM | POA: Insufficient documentation

## 2016-03-03 DIAGNOSIS — C44310 Basal cell carcinoma of skin of unspecified parts of face: Secondary | ICD-10-CM | POA: Insufficient documentation

## 2016-03-03 DIAGNOSIS — Z87891 Personal history of nicotine dependence: Secondary | ICD-10-CM | POA: Insufficient documentation

## 2016-03-03 DIAGNOSIS — G479 Sleep disorder, unspecified: Secondary | ICD-10-CM

## 2016-03-03 DIAGNOSIS — N049 Nephrotic syndrome with unspecified morphologic changes: Secondary | ICD-10-CM

## 2016-03-03 DIAGNOSIS — J449 Chronic obstructive pulmonary disease, unspecified: Secondary | ICD-10-CM

## 2016-03-03 DIAGNOSIS — Z8679 Personal history of other diseases of the circulatory system: Secondary | ICD-10-CM | POA: Diagnosis not present

## 2016-03-03 DIAGNOSIS — I10 Essential (primary) hypertension: Secondary | ICD-10-CM | POA: Diagnosis not present

## 2016-03-03 NOTE — Assessment & Plan Note (Signed)
>>  ASSESSMENT AND PLAN FOR BERGER'S DISEASE: IGA NEPHROPATHY  WRITTEN ON 03/03/2016  4:18 PM BY OPALSKI, DEBORAH, DO  Mostly affects his feet and balance.  Doesn';t need cane yet.

## 2016-03-03 NOTE — Assessment & Plan Note (Signed)
Patient's supposed to be on [facility] and says he never picked up the prescription. Rarely uses Xopenex for rescue

## 2016-03-03 NOTE — Progress Notes (Signed)
Marjory Sneddon, D.O. Primary care at [facility] patient office visit  Impression and Recommendations:    1. Essential hypertension   2. h/o GAD w panic   3. Signficant h/o tobacco abuse (150pk/yr hx)   4. Berger's disease nephrotic syndrome   5. COPD mixed type ([place])   6. H/O Acute CHF exacc   7. Sleep difficulties      Blood pressure appropriate for age, on diuretics, managed by cardiology  Denies current symptoms, we'll keep visual  Discussed with patient importance of colon cancer screening etc. Asked him to make a follow-up for a complete physical exam\ yearly screening evaluation  Encouraged patient to obtain his Dulera inhaler, use daily and follow-up with pulmonary; symptoms stable currently  Take melatonin 5-10 mg of the extended release formula in sublingual form and take that every night before bedtime. The highest you may go his 10 mg. We will reassess this in one month    Patient's Medications  New Prescriptions   No medications on file  Previous Medications   ASPIRIN EC 81 MG EC TABLET    Take 1 tablet (81 mg total) by mouth daily.   ASPIRIN-SALICYLAMIDE-CAFFEINE (BC HEADACHE POWDER PO)    Take 1 Package by mouth daily as needed (Body Aches).   FUROSEMIDE (LASIX) 80 MG TABLET    Take 2 tablets (160 mg total) by mouth 2 (two) times daily.   LEDIPASVIR-SOFOSBUVIR (HARVONI) 90-400 MG TABS    Take 1 tablet by mouth daily.   LEVALBUTEROL (XOPENEX HFA) 45 MCG/ACT INHALER    Inhale 1-2 puffs into the lungs every 4 (four) hours as needed for wheezing.   METOLAZONE (ZAROXOLYN) 2.5 MG TABLET    Take 1 tablet (2.5 mg total) by mouth as directed. Take 2.5 mg today and on Sunday morning only; take 30 minutes before am dose of lasix   POTASSIUM CHLORIDE 20 MEQ TBCR    Take 20 mEq by mouth 2 (two) times daily.  Modified Medications   No medications on file  Discontinued Medications   ZOLPIDEM (AMBIEN) 5 MG TABLET    Take 1 tablet (5 mg total) by mouth at  bedtime as needed for sleep.    Return in about 4 weeks (around 03/31/2016) for Follow-up of sleep.  The patient was counseled, risk factors were discussed, anticipatory guidance given.  Gross side effects, risk and benefits, and alternatives of medications discussed with patient.  Patient is aware that all medications have potential side effects and we are unable to predict every side effect or drug-drug interaction that may occur.  Expresses verbal understanding and consents to current therapy plan and treatment regimen.  Please see AVS handed out to patient at the end of our visit for further patient instructions/ counseling done pertaining to today's office visit.    Note: This document was prepared using Dragon voice recognition software and may include unintentional dictation errors.  ----------------------------------------------------------------------------------------------------------------------    Subjective:    Chief Complaint  Patient presents with  . Establish Care  . Insomnia    New patient visit.  HPI: George [name] is a pleasant 65 y.o. male who presents to [place] [facility] today to review their medical history with me and establish care.   Lives by himself, siblings All live around here. Married yrs ago- in 1980- didn't work. Retired from [employer]- all kinds of exposures.   Patient had little to no health care until approximately  one year or so ago.    H/o smoking 96yrs at 2-3 PPD.   Quit over one yr ago.   When dx with Berger's dx.   Dr. Lyndee Leo Sanger   ( plastics and reconstuctive sx at [facility]) sees her in [place] at [facility] in the past who has diagnosed him with Berger's disease.   Dr [name]-   Cards SVT   Dr Florene Glen- renal -     Seems to think renal damage from Berger's dx.  ABout one year ago when dx with Berger's Dx   Terri Piedra NP -    Pulm for COPD; uses Xopenex- rarely less than once q 2 wks   Dr Gae Gallop-    Vascular and vein specilaists   Dr Linus Salmons-    GI for Hep C, on harvoni..   Pt had blood transfusion 1970 for R  Femur fracture/ repair in OR. -  Dr Linus Salmons ordered US ABD 5/17- pt had AAA- infrarenal- Infrarenal abdominal aortic aneurysm. Recommend followup by ultrasound in 3 years.       Patient Active Problem List   Diagnosis Date Noted  . CKD (chronic kidney disease) 10/16/2015    Priority: High  . COPD mixed type ([place]) 10/12/2015    Priority: High  . Essential hypertension 08/22/2015    Priority: High  . Signficant h/o tobacco abuse (150pk/yr hx) 03/03/2016    Priority: Medium  . h/o GAD w panic 03/03/2016    Priority: Medium  . Hepatitis C infection 11/14/2015    Priority: Medium  . Venous insufficiency of both lower extremities 09/21/2015    Priority: Medium  . H/O CHF 08/20/2015    Priority: Medium  . h/o Basal cell carcinoma of nose 03/03/2016    Priority: Low  . Sleep difficulties 03/03/2016  . Berger's disease: IgA Nephropathy  10/12/2015  . AAA -   (f/up US 12/2018) 09/24/2015  . h/o Hypothyroidism 09/22/2015  . h/o SVT (supraventricular tachycardia) s/p Ablation 05/22/2015     Past Medical History:  Diagnosis Date  . AAA (abdominal aortic aneurysm) ([place])    a. [place] 09/2015 - 3cm distal AAA. //  b. [place] 5/17: 3.1 cm   . Basal cell carcinoma of right side of nose   . Berger's disease   . CKD (chronic kidney disease) 09/2015  . GERD (gastroesophageal reflux disease)   . Nephrotic syndrome   . Panic attacks   . SVT (supraventricular tachycardia) ([place]) 05/22/2015     Past Surgical History:  Procedure Laterality Date  . BASAL CELL CARCINOMA EXCISION Right ~ 06/2015   "side of my nose"  . ELECTROPHYSIOLOGIC STUDY N/A 09/10/2015   Procedure: SVT Ablation;  Surgeon: Evans Lance, MD;  Location: [facility];  Service: Cardiovascular;  Laterality: N/A;  . FEMUR FRACTURE SURGERY Right 1970   "had plate & pin put in"  . FEMUR HARDWARE REMOVAL  Right 1971   "removed plate; left pin in  . FRACTURE SURGERY    . KNEE ARTHROSCOPY Right ~ 1985  . TONSILLECTOMY AND ADENOIDECTOMY  ~ 1955     Family History  Problem Relation Age of Onset  . Heart disease Mother     Before age 16  . Heart attack Mother   . Cancer Mother     lung and colon  . Stroke Mother   . Heart failure Father   . Heart disease Father     After age 64  . Heart attack Father   .  Hyperlipidemia Father   . Healthy Maternal Grandmother   . Healthy Maternal Grandfather   . Diabetes Paternal Grandmother   . Healthy Paternal Grandfather   . Hypertension Neg Hx      History  Drug Use No    History  Alcohol Use  . 1.8 - 2.4 oz/week  . 3 - 4 Shots of liquor per week    History  Smoking Status  . Former Smoker  . Packs/day: 2.00  . Years: 50.00  . Types: Cigarettes  . Quit date: 01/24/2015  Smokeless Tobacco  . Never Used    Patient's Medications  New Prescriptions   No medications on file  Previous Medications   ASPIRIN EC 81 MG EC TABLET    Take 1 tablet (81 mg total) by mouth daily.   ASPIRIN-SALICYLAMIDE-CAFFEINE (BC HEADACHE POWDER PO)    Take 1 Package by mouth daily as needed (Body Aches).   FUROSEMIDE (LASIX) 80 MG TABLET    Take 2 tablets (160 mg total) by mouth 2 (two) times daily.   LEDIPASVIR-SOFOSBUVIR (HARVONI) 90-400 MG TABS    Take 1 tablet by mouth daily.   LEVALBUTEROL (XOPENEX HFA) 45 MCG/ACT INHALER    Inhale 1-2 puffs into the lungs every 4 (four) hours as needed for wheezing.   METOLAZONE (ZAROXOLYN) 2.5 MG TABLET    Take 1 tablet (2.5 mg total) by mouth as directed. Take 2.5 mg today and on Sunday morning only; take 30 minutes before am dose of lasix   POTASSIUM CHLORIDE 20 MEQ TBCR    Take 20 mEq by mouth 2 (two) times daily.  Modified Medications   No medications on file  Discontinued Medications   ZOLPIDEM (AMBIEN) 5 MG TABLET    Take 1 tablet (5 mg total) by mouth at bedtime as needed for sleep.    Review of  patient's allergies indicates no known allergies.  Review of Systems:   ( Completed via Adult Medical History Intake form today ) General:  Denies fever, chills, appetite changes, unexplained weight loss.  Optho/Auditory:   Denies visual changes, blurred vision/LOV, ringing in ears/ diff hearing Respiratory:   Denies SOB, DOE, cough, wheezing.  Cardiovascular:   Denies chest pain, palpitations, new onset peripheral edema  Gastrointestinal:   Denies nausea, vomiting, diarrhea.  Genitourinary:    Denies dysuria, increased frequency, flank pain.  Endocrine:     Denies hot or cold intolerance, polyuria, polydipsia. Musculoskeletal:  Denies unexplained myalgias, joint swelling, arthralgias, gait problems.  Skin:  Denies rash, suspicious lesions or new/ changes in moles Neurological:    Denies dizziness, syncope, unexplained weakness, lightheadedness, numbness  Psychiatric/Behavioral:   Denies mood changes, suicidal or homicidal ideations, hallucinations    Objective:   Blood pressure 127/84, pulse 87, height 6' 1.75" (1.873 m), weight 192 lb 4.8 oz (87.2 kg). Body mass index is 24.86 kg/m.  General: Well Developed, well nourished, and in no acute distress.  Neuro: Alert and oriented x3, extra-ocular muscles intact, sensation grossly intact.  HEENT: Normocephalic, atraumatic, pupils equal round reactive to light, neck supple, no gross masses, no carotid bruits, no JVD apprec Skin: no gross suspicious lesions or rashes  Cardiac: Regular rate and rhythm, no murmurs rubs or gallops.  Respiratory: Essentially clear to auscultation bilaterally. Not using accessory muscles, speaking in full sentences.  Abdominal: Soft, not grossly distended Musculoskeletal: Ambulates w/o diff, FROM * 4 ext.  Vasc: less 2 sec cap RF, warm and pink, L Lext- 3+ pit edema, R lext 1+ pit  edema.  Psych:  No HI/SI, judgement and insight good.

## 2016-03-03 NOTE — Assessment & Plan Note (Signed)
Seems to think renal damage from Berger's dx.  ABout one year ago when dx with Berger's Dx

## 2016-03-03 NOTE — Assessment & Plan Note (Signed)
On 09/21/2015, echo showed:  Systolic function was at the lower limits of normal. The estimated ejection fraction of 50% to 55%.

## 2016-03-03 NOTE — Assessment & Plan Note (Signed)
Not taking Dulera per previous Pulm notes-  Didn't get it filled yet.

## 2016-03-03 NOTE — Patient Instructions (Signed)
Take melatonin 5-10 mg of the extended release formula with the sublingual form and take that every night before bedtime. The highest you may go his 10 mg. We will reassess this in one month    Insomnia Insomnia is a sleep disorder that makes it difficult to fall asleep or to stay asleep. Insomnia can cause tiredness (fatigue), low energy, difficulty concentrating, mood swings, and poor performance at work or school.  There are three different ways to classify insomnia:  Difficulty falling asleep.  Difficulty staying asleep.  Waking up too early in the morning. Any type of insomnia can be long-term (chronic) or short-term (acute). Both are common. Short-term insomnia usually lasts for three months or less. Chronic insomnia occurs at least three times a week for longer than three months. CAUSES  Insomnia may be caused by another condition, situation, or substance, such as:  Anxiety.  Certain medicines.  Gastroesophageal reflux disease (GERD) or other gastrointestinal conditions.  Asthma or other breathing conditions.  Restless legs syndrome, sleep apnea, or other sleep disorders.  Chronic pain.  Menopause. This may include hot flashes.  Stroke.  Abuse of alcohol, tobacco, or illegal drugs.  Depression.  Caffeine.   Neurological disorders, such as Alzheimer disease.  An overactive thyroid (hyperthyroidism). The cause of insomnia may not be known. RISK FACTORS Risk factors for insomnia include:  Gender. Women are more commonly affected than men.  Age. Insomnia is more common as you get older.  Stress. This may involve your professional or personal life.  Income. Insomnia is more common in people with lower income.  Lack of exercise.   Irregular work schedule or night shifts.  Traveling between different time zones. SIGNS AND SYMPTOMS If you have insomnia, trouble falling asleep or trouble staying asleep is the main symptom. This may lead to other symptoms,  such as:  Feeling fatigued.  Feeling nervous about going to sleep.  Not feeling rested in the morning.  Having trouble concentrating.  Feeling irritable, anxious, or depressed. TREATMENT  Treatment for insomnia depends on the cause. If your insomnia is caused by an underlying condition, treatment will focus on addressing the condition. Treatment may also include:   Medicines to help you sleep.  Counseling or therapy.  Lifestyle adjustments. HOME CARE INSTRUCTIONS   Take medicines only as directed by your health care provider.  Keep regular sleeping and waking hours. Avoid naps.  Keep a sleep diary to help you and your health care provider figure out what could be causing your insomnia. Include:   When you sleep.  When you wake up during the night.  How well you sleep.   How rested you feel the next day.  Any side effects of medicines you are taking.  What you eat and drink.   Make your bedroom a comfortable place where it is easy to fall asleep:  Put up shades or special blackout curtains to block light from outside.  Use a white noise machine to block noise.  Keep the temperature cool.   Exercise regularly as directed by your health care provider. Avoid exercising right before bedtime.  Use relaxation techniques to manage stress. Ask your health care provider to suggest some techniques that may work well for you. These may include:  Breathing exercises.  Routines to release muscle tension.  Visualizing peaceful scenes.  Cut back on alcohol, caffeinated beverages, and cigarettes, especially close to bedtime. These can disrupt your sleep.  Do not overeat or eat spicy foods right before bedtime. This  can lead to digestive discomfort that can make it hard for you to sleep.  Limit screen use before bedtime. This includes:  Watching TV.  Using your smartphone, tablet, and computer.  Stick to a routine. This can help you fall asleep faster. Try to do a  quiet activity, brush your teeth, and go to bed at the same time each night.  Get out of bed if you are still awake after 15 minutes of trying to sleep. Keep the lights down, but try reading or doing a quiet activity. When you feel sleepy, go back to bed.  Make sure that you drive carefully. Avoid driving if you feel very sleepy.  Keep all follow-up appointments as directed by your health care provider. This is important. SEEK MEDICAL CARE IF:   You are tired throughout the day or have trouble in your daily routine due to sleepiness.  You continue to have sleep problems or your sleep problems get worse. SEEK IMMEDIATE MEDICAL CARE IF:   You have serious thoughts about hurting yourself or someone else.   This information is not intended to replace advice given to you by your health care provider. Make sure you discuss any questions you have with your health care provider.   Document Released: 07/25/2000 Document Revised: 04/18/2015 Document Reviewed: 04/28/2014 Elsevier Interactive Patient Education [employer].

## 2016-03-03 NOTE — Assessment & Plan Note (Signed)
Mostly affects his feet and balance.  Doesn';t need cane yet.

## 2016-03-03 NOTE — Assessment & Plan Note (Signed)
Dr Linus Salmons ordered US ABD 5/17- pt had AAA- infrarenal- Infrarenal abdominal aortic aneurysm. Recommend followup by ultrasound in 3 years.

## 2016-03-06 DIAGNOSIS — N183 Chronic kidney disease, stage 3 (moderate): Secondary | ICD-10-CM | POA: Diagnosis not present

## 2016-03-17 DIAGNOSIS — E875 Hyperkalemia: Secondary | ICD-10-CM | POA: Diagnosis not present

## 2016-03-17 NOTE — Progress Notes (Signed)
Cardiology Office Note:    Date:  03/18/2016   ID:  George [name], DOB 24-Jan-1951, MRN [id number]  PCP:  [name], DO  Cardiologist: Dr. Quay Burow  Electrophysiologist: Dr. Cristopher Peru  Nephrologist - Dr. Florene Glen.  Referring MD: Golden Circle, FNP   Chief Complaint  Patient presents with  . Follow-up    volume overload   History of Present Illness:    George [name] is a 65 y.o. male with a hx of PSVT, COPD, HTN, chronic LE extremity edema from venous insufficiency with assoc venous stasis ulcers, Berger's disease. He was admitted in 10/16 with SVT and elevated Troponin levels. Myoview was low risk at that time. He was admitted 1/17 with AECOPD. He had recurrent SVT on the floor that converted with IV Adenosine. Echo at that time demonstrated EF 40-45% with diffuse HK. 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 Cristopher Peru. He required IV Lasix prior to DC due to worsening dyspnea.   Admitted in 2/17 with acute respiratory failure, anasarca in the setting of nephrotic syndrome. Pulmonary edema was noted on CXR. Protein > 300 on UA. 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 He was followed by nephrology and maintained on high-dose diuretics.  I saw him a couple weeks ago.  He was markedly volume overloaded.  I adjusted his diuretics.  He was not taking Lasix every day.  Since last seen, he is doing much better.  His weight is up but he is trying to gain weight.  His LE edema is improved.  His breathing is improved.  He denies chest pain.  He denies PND.  He denies syncope.  His biggest problem is insomnia. He FU with his new PCP soon to discuss sleep aides.    Prior CV studies that were reviewed today include:    Abdominal US 12/19/15 Abdominal aorta: Measures up to 3.1 cm  Echo 09/21/15 EF 50-55%, mild LAE  Echo 08/22/15 Mod LVH, EF 40-45%, diff HK, mild MR  Myoview 10/16 1. Inferior wall attenuation on stress and rest imaging probably  due to diaphragmatic attenuation given the lack of abnormal wall motion in this vicinity. Scarring of the inferior wall is a differential diagnostic consideration. No inducible ischemia. 2. Normal left ventricular wall motion. 3. Left ventricular ejection fraction 65% 4. Low-risk stress test findings*.   Past Medical History:  Diagnosis Date  . AAA (abdominal aortic aneurysm) ([place])    a. [place] 09/2015 - 3cm distal AAA. //  b. [place] 5/17: 3.1 cm   . Basal cell carcinoma of right side of nose   . Berger's disease   . CKD (chronic kidney disease) 09/2015  . GERD (gastroesophageal reflux disease)   . Nephrotic syndrome   . Panic attacks   . SVT (supraventricular tachycardia) ([place]) 05/22/2015    Past Surgical History:  Procedure Laterality Date  . BASAL CELL CARCINOMA EXCISION Right ~ 06/2015   "side of my nose"  . ELECTROPHYSIOLOGIC STUDY N/A 09/10/2015   Procedure: SVT Ablation;  Surgeon: Evans Lance, MD;  Location: [facility];  Service: Cardiovascular;  Laterality: N/A;  . FEMUR FRACTURE SURGERY Right 1970   "had plate & pin put in"  . FEMUR HARDWARE REMOVAL Right 1971   "removed plate; left pin in  . FRACTURE SURGERY    . KNEE ARTHROSCOPY Right ~ 1985  . TONSILLECTOMY AND ADENOIDECTOMY  ~ 1955    Current Medications: Outpatient Medications Prior to Visit  Medication Sig Dispense Refill  .  aspirin EC 81 MG EC tablet Take 1 tablet (81 mg total) by mouth daily.    . Aspirin-Salicylamide-Caffeine (BC HEADACHE POWDER PO) Take 1 Package by mouth daily as needed (Body Aches).    . furosemide (LASIX) 80 MG tablet Take 2 tablets (160 mg total) by mouth 2 (two) times daily. 360 tablet 3  . levalbuterol (XOPENEX HFA) 45 MCG/ACT inhaler Inhale 1-2 puffs into the lungs every 4 (four) hours as needed for wheezing. 1 Inhaler 12  . potassium chloride 20 MEQ TBCR Take 20 mEq by mouth 2 (two) times daily. 180 tablet 3  . Ledipasvir-Sofosbuvir (HARVONI) 90-400 MG TABS Take 1 tablet by mouth  daily. (Patient not taking: Reported on 03/18/2016) 28 tablet 2  . metolazone (ZAROXOLYN) 2.5 MG tablet Take 1 tablet (2.5 mg total) by mouth as directed. Take 2.5 mg today and on Sunday morning only; take 30 minutes before am dose of lasix (Patient not taking: Reported on 03/18/2016) 10 tablet 0   No facility-administered medications prior to visit.       Allergies:   Review of patient's allergies indicates no known allergies.   Social History   Social History  . Marital status: Divorced    Spouse name: N/A  . Number of children: 0  . Years of education: 12   Occupational History  . Retired    Social History Main Topics  . Smoking status: Former Smoker    Packs/day: 2.00    Years: 50.00    Types: Cigarettes    Quit date: 01/24/2015  . Smokeless tobacco: Never Used  . Alcohol use 1.8 - 2.4 oz/week    3 - 4 Shots of liquor per week  . Drug use: No  . Sexual activity: Not Currently   Other Topics Concern  . None   Social History Narrative   Fun: Trade out work, outdoors   Denies religious beliefs effecting health care.      Family History:  The patient's family history includes Cancer in his mother; Diabetes in his paternal grandmother; Healthy in his maternal grandfather, maternal grandmother, and paternal grandfather; Heart attack in his father and mother; Heart disease in his father and mother; Heart failure in his father; Hyperlipidemia in his father; Stroke in his mother.   ROS:   Please see the history of present illness.    Review of Systems  Cardiovascular: Positive for leg swelling.  Respiratory: Positive for shortness of breath.   Neurological: Positive for loss of balance.  Psychiatric/Behavioral: The patient has insomnia.    All other systems reviewed and are negative.   EKGs/Labs/Other Test Reviewed:    EKG:  EKG is  ordered today.  The ekg ordered today demonstrates NSR, HR 89, LVH, QTc 484 ms, PRWP, similar to prior tracing 10/16/15  Recent  Labs: 08/29/2015: Pro B Natriuretic peptide (BNP) 894.0 09/20/2015: Magnesium 2.0; TSH 4.860 10/16/2015: Brain Natriuretic Peptide 527.7 11/26/2015: Hemoglobin 11.6; Platelets 315 02/22/2016: ALT 8 02/29/2016: BUN 20; Creat 1.19; Potassium 5.0; Sodium 141   Recent Lipid Panel    Component Value Date/Time   CHOL 187 08/29/2015 1554   TRIG 146.0 08/29/2015 1554   HDL 41.80 08/29/2015 1554   CHOLHDL 4 08/29/2015 1554   VLDL 29.2 08/29/2015 1554   LDLCALC 116 (H) 08/29/2015 1554     Physical Exam:    VS:  BP (!) 150/100   Pulse 89   Ht 6\' 4"  (1.93 m)   Wt 207 lb 12.8 oz (94.3 kg)   BMI  25.29 kg/m     Wt Readings from Last 3 Encounters:  03/18/16 207 lb 12.8 oz (94.3 kg)  03/03/16 192 lb 4.8 oz (87.2 kg)  02/22/16 227 lb (103 kg)     Physical Exam  Constitutional: He is oriented to person, place, and time. He appears well-developed and well-nourished. No distress.  HENT:  Head: Normocephalic and atraumatic.  Eyes: No scleral icterus.  Neck: Normal range of motion. No JVD present.  Cardiovascular: Normal rate, regular rhythm, S1 normal and S2 normal.  Exam reveals no gallop and no friction rub.   No murmur heard. Pulmonary/Chest: Effort normal and breath sounds normal. He has no wheezes. He has no rhonchi. He has no rales.  Abdominal: Soft. He exhibits no distension and no mass. There is no tenderness.  Musculoskeletal: He exhibits edema.  Trace bilateral LE edema, L>R No presacral edema today  Lymphadenopathy:    He has no cervical adenopathy.  Neurological: He is alert and oriented to person, place, and time.  Skin: Skin is warm and dry.  Psychiatric: He has a normal mood and affect.    ASSESSMENT:    1. Nephrotic syndrome   2. Anasarca   3. Essential hypertension   4. SVT (supraventricular tachycardia) ([place])   5. CKD (chronic kidney disease), stage 3 (moderate)    PLAN:    In order of problems listed above:  1. Nephrotic syndrome - This has been felt to be the  factor contributing to volume excess and was thoroughly evaluated in the hospital earlier this year.  He had renal bx with noted findings of Glomerulonephritis. Last echo in the hospital demonstrated normal LVEF. It was NOT thought that diastolic dysfunction was the cause of his volume excess.  He follows up with nephrology in the next 2 weeks.   2. Anasarca - His weight is up today. But, his exam is much improved. His volume appears improved/stable now.  I will continue current diuretics.  His PCP recently stopped his K+ due to high K+.  Repeat BMET today.  3. HTN - Blood pressure uncontrolled.  He used to be on Bystolic.  Resume Bystolic 5 mg QD.  I will defer +/- ACEI/ARB to his nephrologist.    4. PSVT - s/p RFCA. Continue beta blocker.   5. CKD - FU with Nephrology soon.  Check BMET today.      Medication Adjustments/Labs and Tests Ordered: Current medicines are reviewed at length with the patient today.  Concerns regarding medicines are outlined above.  Medication changes, Labs and Tests ordered today are outlined in the Patient Instructions noted below. Patient Instructions  Medication Instructions:  START Bystolic (Nebivolol) 5 mg Once daily for blood pressure - a prescription was sent to your pharmacy.  Labwork: Today - BMET  Testing/Procedures: None   Follow-Up: Richardson Dopp, PA-C ON 05/19/16 @ 1:45  Keep your appointment with your kidney doctor this month as planned.   Any Other Special Instructions Will Be Listed Below (If Applicable).  If you need a refill on your cardiac medications before your next appointment, please call your pharmacy.   Signed, Richardson Dopp, PA-C  03/18/2016 12:47 PM    [facility] [place], [place], [place]  [address] Phone: [phone]; Fax: [phone]

## 2016-03-18 ENCOUNTER — Ambulatory Visit (INDEPENDENT_AMBULATORY_CARE_PROVIDER_SITE_OTHER): Payer: Medicare Other | Admitting: Physician Assistant

## 2016-03-18 ENCOUNTER — Encounter (INDEPENDENT_AMBULATORY_CARE_PROVIDER_SITE_OTHER): Payer: Self-pay

## 2016-03-18 ENCOUNTER — Telehealth: Payer: Self-pay | Admitting: *Deleted

## 2016-03-18 ENCOUNTER — Encounter: Payer: Self-pay | Admitting: Physician Assistant

## 2016-03-18 VITALS — BP 150/100 | HR 89 | Ht 76.0 in | Wt 207.8 lb

## 2016-03-18 DIAGNOSIS — N049 Nephrotic syndrome with unspecified morphologic changes: Secondary | ICD-10-CM | POA: Diagnosis not present

## 2016-03-18 DIAGNOSIS — N183 Chronic kidney disease, stage 3 (moderate): Secondary | ICD-10-CM

## 2016-03-18 DIAGNOSIS — I10 Essential (primary) hypertension: Secondary | ICD-10-CM

## 2016-03-18 DIAGNOSIS — I471 Supraventricular tachycardia: Secondary | ICD-10-CM | POA: Diagnosis not present

## 2016-03-18 DIAGNOSIS — R601 Generalized edema: Secondary | ICD-10-CM | POA: Diagnosis not present

## 2016-03-18 LAB — BASIC METABOLIC PANEL
BUN: 27 mg/dL — AB (ref 7–25)
CO2: 28 mmol/L (ref 20–31)
CREATININE: 1.12 mg/dL (ref 0.70–1.25)
Calcium: 8.6 mg/dL (ref 8.6–10.3)
Chloride: 103 mmol/L (ref 98–110)
Glucose, Bld: 89 mg/dL (ref 65–99)
Potassium: 4.6 mmol/L (ref 3.5–5.3)
Sodium: 140 mmol/L (ref 135–146)

## 2016-03-18 MED ORDER — NEBIVOLOL HCL 5 MG PO TABS: 5.0000 mg | ORAL_TABLET | Freq: Every day | ORAL | 11 refills | Status: DC

## 2016-03-18 NOTE — Telephone Encounter (Signed)
DPR ok to lmom. Lmom lab results ; if any questions call [phone].

## 2016-03-18 NOTE — Patient Instructions (Addendum)
Medication Instructions:  START Bystolic (Nebivolol) 5 mg Once daily for blood pressure - a prescription was sent to your pharmacy.  Labwork: Today - BMET  Testing/Procedures: None   Follow-Up: Richardson Dopp, PA-C ON 05/19/16 @ 1:45  Keep your appointment with your kidney doctor this month as planned.   Any Other Special Instructions Will Be Listed Below (If Applicable).  If you need a refill on your cardiac medications before your next appointment, please call your pharmacy.

## 2016-03-24 ENCOUNTER — Ambulatory Visit (INDEPENDENT_AMBULATORY_CARE_PROVIDER_SITE_OTHER): Payer: Medicare Other | Admitting: Internal Medicine

## 2016-03-24 ENCOUNTER — Encounter: Payer: Self-pay | Admitting: Internal Medicine

## 2016-03-24 VITALS — BP 156/95 | HR 84 | Temp 97.7°F | Ht 76.0 in | Wt 203.0 lb

## 2016-03-24 DIAGNOSIS — B182 Chronic viral hepatitis C: Secondary | ICD-10-CM

## 2016-03-24 DIAGNOSIS — K7400 Hepatic fibrosis, unspecified: Secondary | ICD-10-CM | POA: Insufficient documentation

## 2016-03-24 NOTE — Progress Notes (Signed)
   Subjective:    Patient ID: DEIONDRE CHORNEY, male    DOB: [birth year], 65 y.o.   MRN: [id number]  HPI Here for follow up of HCV.   He has genotype 1a, now completed Harvoni for 12 weeks.  No issues.  Pleased with getting medication.  Has F2/3 on elastography.  Continues to be alcohol free.  Initial HCV viral load undetectable.  Hepatitis A and B non immune.     Review of Systems  Constitutional: Negative for activity change and fatigue.  Skin: Negative for rash.  Neurological: Negative for headaches.       Objective:   Physical Exam  Constitutional: He appears well-developed and well-nourished. No distress.  Eyes: No scleral icterus.  Cardiovascular: Normal rate, regular rhythm and normal heart sounds.   Skin: No rash noted.          Assessment & Plan:

## 2016-03-24 NOTE — Assessment & Plan Note (Signed)
End of treatment lab today and rtc 3 months for [facility]

## 2016-03-24 NOTE — Assessment & Plan Note (Signed)
No signs of cirrhosis or follow up [place] screening indicated

## 2016-03-25 DIAGNOSIS — N049 Nephrotic syndrome with unspecified morphologic changes: Secondary | ICD-10-CM | POA: Diagnosis not present

## 2016-03-25 DIAGNOSIS — N183 Chronic kidney disease, stage 3 (moderate): Secondary | ICD-10-CM | POA: Diagnosis not present

## 2016-03-25 DIAGNOSIS — N055 Unspecified nephritic syndrome with diffuse mesangiocapillary glomerulonephritis: Secondary | ICD-10-CM | POA: Diagnosis not present

## 2016-03-25 DIAGNOSIS — B182 Chronic viral hepatitis C: Secondary | ICD-10-CM | POA: Diagnosis not present

## 2016-03-26 LAB — HEPATITIS C RNA QUANTITATIVE: HCV Quantitative: NOT DETECTED IU/mL (ref <15)

## 2016-03-31 ENCOUNTER — Encounter: Payer: Self-pay | Admitting: Family Medicine

## 2016-03-31 ENCOUNTER — Ambulatory Visit (INDEPENDENT_AMBULATORY_CARE_PROVIDER_SITE_OTHER): Payer: Medicare Other | Admitting: Family Medicine

## 2016-03-31 VITALS — BP 153/98 | HR 84 | Wt 203.5 lb

## 2016-03-31 DIAGNOSIS — G4700 Insomnia, unspecified: Secondary | ICD-10-CM

## 2016-03-31 DIAGNOSIS — I151 Hypertension secondary to other renal disorders: Secondary | ICD-10-CM | POA: Diagnosis not present

## 2016-03-31 DIAGNOSIS — N289 Disorder of kidney and ureter, unspecified: Secondary | ICD-10-CM

## 2016-03-31 DIAGNOSIS — F411 Generalized anxiety disorder: Secondary | ICD-10-CM | POA: Diagnosis not present

## 2016-03-31 DIAGNOSIS — J449 Chronic obstructive pulmonary disease, unspecified: Secondary | ICD-10-CM

## 2016-03-31 DIAGNOSIS — E038 Other specified hypothyroidism: Secondary | ICD-10-CM

## 2016-03-31 DIAGNOSIS — N049 Nephrotic syndrome with unspecified morphologic changes: Secondary | ICD-10-CM | POA: Diagnosis not present

## 2016-03-31 DIAGNOSIS — Z9111 Patient's noncompliance with dietary regimen: Secondary | ICD-10-CM

## 2016-03-31 DIAGNOSIS — Z9114 Patient's other noncompliance with medication regimen: Secondary | ICD-10-CM

## 2016-03-31 DIAGNOSIS — N02B9 Other recurrent and persistent immunoglobulin A nephropathy: Secondary | ICD-10-CM

## 2016-03-31 DIAGNOSIS — Z91148 Patient's other noncompliance with medication regimen for other reason: Secondary | ICD-10-CM

## 2016-03-31 MED ORDER — CITALOPRAM HYDROBROMIDE 20 MG PO TABS: ORAL_TABLET | ORAL | 0 refills | Status: DC

## 2016-03-31 MED ORDER — ALPRAZOLAM 0.5 MG PO TABS: ORAL_TABLET | ORAL | 0 refills | Status: DC

## 2016-03-31 MED ORDER — ZOLPIDEM TARTRATE ER 12.5 MG PO TBCR: 12.5000 mg | EXTENDED_RELEASE_TABLET | Freq: Every evening | ORAL | 0 refills | Status: DC | PRN

## 2016-03-31 NOTE — Progress Notes (Signed)
Impression and Recommendations:    1. GAD (generalized anxiety disorder)   2. Insomnia   3. 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 Berger's disease: IgA Nephropathy    6. COPD mixed type ([place])   7. Other specified hypothyroidism   8. Noncompliance with diet and medication regimen    Insomnia Sleep difficulties - secondary to generalized anxiety\ mood disorder.  - We'll start medications for his insomnia and GAD.   I strongly endorse CBT.    Sleep hygiene discussed with patient in detail: - Avoid caffeinated beverages after lunch,  no alcoholic beverages,  no eating within 2-3 hours of lying down,  avoid exposure to blue light before bed,  avoid daytime naps, and  needs to maintain a regular sleep schedule- go to sleep and wake up around the same time every night.   - Resolve concerns or worries before entering bedroom:  Discussed relaxation techniques with patient and to keep a journal to write down fears\ worries.  I suggested seeing a counselor for CBT.   - Recommend patient meditate or do deep breathing exercises to help relax.   Incorporate the use of white noise machines or listen to "sleep meditation music", or recordings of guided meditations for sleep from YouTube which are free, such as  "guided meditation for detachment from over thinking"  by Mayford Knife.       GAD (generalized anxiety disorder) with panic attacks - Risks of benzodiazepines discussed in detail with patient.  Only use Xanax if he develops panic attacks.  Explained to patient I will not give him more than #30 every 6 months, and goal is to eventually be off these once GAD is well controlled with SSRI.  - Explained to patient that SSRIs can take up to several weeks to reach adequate blood levels in the body to be effective.        COPD mixed type [facility]) - Patient supposed to be on [facility], but remains noncompliant and never picked up the  prescription. 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  Patient refuses to believe that he has hypertension requiring medications and endorses that his elevated blood pressure is from lack of sleep.  Encouraged to start the bysystolic that was prescribed by his cardiology team.   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  His blood pressure is being treated by his cardiology and nephrology providers.   Richardson Dopp / Dr [name]  and  Dr Florene Glen respectively   - PLEASE NOTE: No medications were discontinued by me today.   Patient's Medications  New Prescriptions   ALPRAZOLAM (XANAX) 0.5 MG TABLET    Take 1 tablet daily as needed for panic attacks.   CITALOPRAM (CELEXA) 20 MG TABLET    Start with one half a tablet daily for 1 week then increase to a full tablet daily   ZOLPIDEM (AMBIEN CR) 12.5 MG CR TABLET    Take 1 tablet (12.5 mg total) by mouth at bedtime as needed for sleep.  Previous Medications   ASPIRIN EC 81 MG EC TABLET    Take 1 tablet (81 mg total) by mouth  daily.   ASPIRIN-SALICYLAMIDE-CAFFEINE (BC HEADACHE POWDER PO)    Take 1 Package by mouth daily as needed (Body Aches).   FUROSEMIDE (LASIX) 80 MG TABLET    Take 80 mg by mouth daily.   LEVALBUTEROL (XOPENEX HFA) 45 MCG/ACT INHALER    Inhale 1-2 puffs into the lungs every 4 (four) hours as needed for wheezing.  Modified Medications   No medications on file  Discontinued Medications   FUROSEMIDE (LASIX) 80 MG TABLET    Take 2 tablets (160 mg total) by mouth 2 (two) times daily.   NEBIVOLOL (BYSTOLIC) 5 MG TABLET    Take 1 tablet (5 mg total) by mouth daily.   POTASSIUM CHLORIDE 20 MEQ TBCR    Take 20 mEq by mouth 2 (two) times daily.   - Please Note:   No medications were discontinued  today by me during this office visit.  The patient reported that he was not taking his KCl or Lasix, nor his Bystolic at today's OV, so it was marked as so by my CMA.    Return in about 3 weeks (around 04/21/2016) for Recheck 3 weeks because started Ambien, Xanax, Celexa. for mood with secondary insomnia today   The patient was counseled, risk factors were discussed, anticipatory guidance given.  Gross side effects, risk and benefits, and alternatives of medications discussed with patient.  Patient is aware that all medications have potential side effects and we are unable to predict every side effect or drug-drug interaction that may occur.  Expresses verbal understanding and consents to current therapy plan and treatment regimen.  Please see AVS handed out to patient at the end of our visit for further patient instructions/ counseling done pertaining to today's office visit.    Note: This document was prepared using Dragon voice recognition software and may include unintentional dictation errors.   --------------------------------------------------------------------------------------------------------------------------------------------------------------------------------------------------------------------------------------------    Subjective:    CC:  Chief Complaint  Patient presents with  . Follow-up    sleep difficulties    HPI: George [name] is a 65 y.o. male who presents to [place] at [facility] today for sleep difficulties.    Sleep-  not improved with melatonin.  Sometimes he can stay up for 36-48 hrs- feels horrible.   Never had this in the past-  And now worse ever since albation for a-fib.    Feels nervous, anxious quite often.  Scared to fall alseep.   Panicky at times.   (No sign other- not worried about libido etc.)    Had ambien in past- not sure of dose- helped some to put him asleep but couldn't stay asleep.   Still has trouble falling asleep and  staying asleep.   If he does fall asleep he may be asleep for only 2-3 hours then wake up and feel scared or anxious.  He tells me today that his anxiety and nervousness is what is preventing him from sleeping well.  He denies feeling depressed or down.   He denies DIP, SOB, orthopnea or PND.       BP- elevated pt thinks due to lack of sleep; however review of chart reveals it is been a problem with fluid overload secondary to his nephrotic syndrome.   I do not have renal notes to review.   I have not made any adjustments to his medications, in regards to blood pressure medicines, diuretics, potassium supplementation etc. and will continue to leave this to his cardiology and nephrology teams.  -  Last OV with his Cardiology team- appt with Richardson Dopp, PAC was on 03/18/16.   Bp at that time, in the office was [id number] still and bystolic 5mg  QD was started (which pt states he is not on) and pt was to f/up with Nephrology re: renal-fluid status and +/- ARB.    Scott also had been monitoring patient's volume status closely and making appropriate adjustments as needed.    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    Echocardiogram was normal at this time, and showed LVEF of 50-55% and mild LAE.     --->  He has been volume overloaded more recently and has been working closely with his cardiology team and nephrology teams to adjust diuretics, potassium supplements etc.  Abdominal US 12/19/15 Abdominal aorta: Measures up to 3.1 cm- repeat 2020   Wt Readings from Last 3 Encounters:  03/31/16 203 lb 8 oz (92.3 kg)  03/24/16 203 lb (92.1 kg)  03/18/16 207 lb 12.8 oz (94.3 kg)   BP Readings from Last 3 Encounters:  03/31/16 (!) 153/98  03/24/16 (!) 156/95  03/18/16 (!) 150/100   Pulse Readings from Last 3 Encounters:    03/31/16 84  03/24/16 84  03/18/16 89     Patient Active Problem List   Diagnosis Date Noted  . Alcoholism /alcohol abuse- episodic binges 04/05/2016    Priority: High  . 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 Insomnia 04/05/2016    Priority: High  . Noncompliance with diet and medication regimen 04/05/2016    Priority: High  . Signficant h/o tobacco abuse (150pk/yr hx) 03/03/2016    Priority: High  . h/o Basal cell carcinoma of nose 03/03/2016    Priority: High  . GAD (generalized anxiety disorder) 03/03/2016    Priority: High  . Hepatitis C infection 11/14/2015    Priority: High  . COPD mixed type ([place]) 10/12/2015    Priority: High  . h/o Hypothyroidism 09/22/2015    Priority: High  . 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 Berger's disease: IgA Nephropathy  10/12/2015    Priority: Medium  . Venous insufficiency of both lower extremities 09/21/2015    Priority: Medium  . H/O CHF 08/20/2015    Priority: Medium  . h/o SVT (supraventricular tachycardia) s/p Ablation 05/22/2015    Priority: Medium  . Liver fibrosis ([place]) 03/24/2016  . AAA -   (f/up US 12/2018) 09/24/2015    Past Medical history, Surgical history, Family history, Social history, Allergies and Medications have been entered into the medical record, reviewed and changed as needed.   Allergies:  No Known Allergies  Review of Systems: No fever/ chills, night sweats, no unintended weight loss, No chest pain, or increased shortness of breath. No N/V/D.  Pertinent positives and negatives noted in HPI above    Objective:   Blood pressure 153/98 (reck was higher 170's/100's), pulse 84, weight 203 lb 8 oz (92.3 kg). Body mass index is 24.77 kg/m.  General: Well Developed, well nourished, appropriate for stated age.  Neuro: Alert and oriented x3, extra-ocular muscles intact, sensation grossly intact.  HEENT: Normocephalic, atraumatic,  neck supple   Skin: Warm and dry, no gross rash. Cardiac: RRR, S1 S2,  no murmurs rubs or gallops.  Respiratory: ECTA B/L,  Not using accessory muscles, speaking in full sentences-unlabored. Vascular:  tr lower ext edema, cap RF less 2 sec. Psych: No SI/HI, Insight and judgement good

## 2016-03-31 NOTE — Patient Instructions (Addendum)
BP- pt will monitor at home and bring in log of BP for next OV !!     Alprazolam tablets What is this medicine? ALPRAZOLAM (al PRAY zoe lam) is a benzodiazepine. It is used to treat anxiety and panic attacks. This medicine may be used for other purposes; ask your health care provider or pharmacist if you have questions. What should I tell my health care provider before I take this medicine? They need to know if you have any of these conditions: -an alcohol or drug abuse problem -bipolar disorder, depression, psychosis or other mental health conditions -glaucoma -kidney or liver disease -lung or breathing disease -myasthenia gravis -Parkinson's disease -porphyria -seizures or a history of seizures -suicidal thoughts -an unusual or allergic reaction to alprazolam, other benzodiazepines, foods, dyes, or preservatives -pregnant or trying to get pregnant -breast-feeding How should I use this medicine? Take this medicine by mouth with a glass of water. Follow the directions on the prescription label. Take your medicine at regular intervals. Do not take it more often than directed. If you have been taking this medicine regularly for some time, do not suddenly stop taking it. You must gradually reduce the dose or you may get severe side effects. Ask your doctor or health care professional for advice. Even after you stop taking this medicine it can still affect your body for several days. Talk to your pediatrician regarding the use of this medicine in children. Special care may be needed. Overdosage: If you think you have taken too much of this medicine contact a poison control center or emergency room at once. NOTE: This medicine is only for you. Do not share this medicine with others. What if I miss a dose? If you miss a dose, take it as soon as you can. If it is almost time for your next dose, take only that dose. Do not take double or extra doses. What may interact with this medicine? Do  not take this medicine with any of the following medications: -certain medicines for HIV infection or AIDS -ketoconazole -itraconazole This medicine may also interact with the following medications: -birth control pills -certain macrolide antibiotics like clarithromycin, erythromycin, troleandomycin -cimetidine -cyclosporine -ergotamine -grapefruit juice -herbal or dietary supplements like kava kava, melatonin, dehydroepiandrosterone, DHEA, St. John's Wort or valerian -imatinib, STI-571 -isoniazid -levodopa -medicines for depression, anxiety, or psychotic disturbances -prescription pain medicines -rifampin, rifapentine, or rifabutin -some medicines for blood pressure or heart problems -some medicines for seizures like carbamazepine, oxcarbazepine, phenobarbital, phenytoin, primidone This list may not describe all possible interactions. Give your health care provider a list of all the medicines, herbs, non-prescription drugs, or dietary supplements you use. Also tell them if you smoke, drink alcohol, or use illegal drugs. Some items may interact with your medicine. What should I watch for while using this medicine? Visit your doctor or health care professional for regular checks on your progress. Your body can become dependent on this medicine. Ask your doctor or health care professional if you still need to take it. You may get drowsy or dizzy. Do not drive, use machinery, or do anything that needs mental alertness until you know how this medicine affects you. To reduce the risk of dizzy and fainting spells, do not stand or sit up quickly, especially if you are an older patient. Alcohol may increase dizziness and drowsiness. Avoid alcoholic drinks. Do not treat yourself for coughs, colds or allergies without asking your doctor or health care professional for advice. Some ingredients can increase possible  side effects. What side effects may I notice from receiving this medicine? Side effects  that you should report to your doctor or health care professional as soon as possible: -allergic reactions like skin rash, itching or hives, swelling of the face, lips, or tongue -confusion, forgetfulness -depression -difficulty sleeping -difficulty speaking -feeling faint or lightheaded, falls -mood changes, excitability or aggressive behavior -muscle cramps -trouble passing urine or change in the amount of urine -unusually weak or tired Side effects that usually do not require medical attention (report to your doctor or health care professional if they continue or are bothersome): -change in sex drive or performance -changes in appetite This list may not describe all possible side effects. Call your doctor for medical advice about side effects. You may report side effects to FDA at 1-800-FDA-1088. Where should I keep my medicine? Keep out of the reach of children. This medicine can be abused. Keep your medicine in a safe place to protect it from theft. Do not share this medicine with anyone. Selling or giving away this medicine is dangerous and against the law. Store at room temperature between 20 and 25 degrees C (68 and 77 degrees F). This medicine may cause accidental overdose and death if taken by other adults, children, or pets. Mix any unused medicine with a substance like cat litter or coffee grounds. Then throw the medicine away in a sealed container like a sealed bag or a coffee can with a lid. Do not use the medicine after the expiration date. NOTE: This sheet is a summary. It may not cover all possible information. If you have questions about this medicine, talk to your doctor, pharmacist, or health care provider.    2016, Elsevier/Gold Standard. (2014-04-18 14:51:36)       Insomnia Insomnia is a sleep disorder that makes it difficult to fall asleep or to stay asleep. Insomnia can cause tiredness (fatigue), low energy, difficulty concentrating, mood swings, and poor  performance at work or school.  There are three different ways to classify insomnia:  Difficulty falling asleep.  Difficulty staying asleep.  Waking up too early in the morning. Any type of insomnia can be long-term (chronic) or short-term (acute). Both are common. Short-term insomnia usually lasts for three months or less. Chronic insomnia occurs at least three times a week for longer than three months. CAUSES  Insomnia may be caused by another condition, situation, or substance, such as:  Anxiety.  Certain medicines.  Gastroesophageal reflux disease (GERD) or other gastrointestinal conditions.  Asthma or other breathing conditions.  Restless legs syndrome, sleep apnea, or other sleep disorders.  Chronic pain.  Menopause. This may include hot flashes.  Stroke.  Abuse of alcohol, tobacco, or illegal drugs.  Depression.  Caffeine.   Neurological disorders, such as Alzheimer disease.  An overactive thyroid (hyperthyroidism). The cause of insomnia may not be known. RISK FACTORS Risk factors for insomnia include:  Gender. Women are more commonly affected than men.  Age. Insomnia is more common as you get older.  Stress. This may involve your professional or personal life.  Income. Insomnia is more common in people with lower income.  Lack of exercise.   Irregular work schedule or night shifts.  Traveling between different time zones. SIGNS AND SYMPTOMS If you have insomnia, trouble falling asleep or trouble staying asleep is the main symptom. This may lead to other symptoms, such as:  Feeling fatigued.  Feeling nervous about going to sleep.  Not feeling rested in the morning.  Having  trouble concentrating.  Feeling irritable, anxious, or depressed. TREATMENT  Treatment for insomnia depends on the cause. If your insomnia is caused by an underlying condition, treatment will focus on addressing the condition. Treatment may also include:   Medicines to  help you sleep.  Counseling or therapy.  Lifestyle adjustments. HOME CARE INSTRUCTIONS   Take medicines only as directed by your health care provider.  Keep regular sleeping and waking hours. Avoid naps.  Keep a sleep diary to help you and your health care provider figure out what could be causing your insomnia. Include:   When you sleep.  When you wake up during the night.  How well you sleep.   How rested you feel the next day.  Any side effects of medicines you are taking.  What you eat and drink.   Make your bedroom a comfortable place where it is easy to fall asleep:  Put up shades or special blackout curtains to block light from outside.  Use a white noise machine to block noise.  Keep the temperature cool.   Exercise regularly as directed by your health care provider. Avoid exercising right before bedtime.  Use relaxation techniques to manage stress. Ask your health care provider to suggest some techniques that may work well for you. These may include:  Breathing exercises.  Routines to release muscle tension.  Visualizing peaceful scenes.  Cut back on alcohol, caffeinated beverages, and cigarettes, especially close to bedtime. These can disrupt your sleep.  Do not overeat or eat spicy foods right before bedtime. This can lead to digestive discomfort that can make it hard for you to sleep.  Limit screen use before bedtime. This includes:  Watching TV.  Using your smartphone, tablet, and computer.  Stick to a routine. This can help you fall asleep faster. Try to do a quiet activity, brush your teeth, and go to bed at the same time each night.  Get out of bed if you are still awake after 15 minutes of trying to sleep. Keep the lights down, but try reading or doing a quiet activity. When you feel sleepy, go back to bed.  Make sure that you drive carefully. Avoid driving if you feel very sleepy.  Keep all follow-up appointments as directed by your  health care provider. This is important. SEEK MEDICAL CARE IF:   You are tired throughout the day or have trouble in your daily routine due to sleepiness.  You continue to have sleep problems or your sleep problems get worse. SEEK IMMEDIATE MEDICAL CARE IF:   You have serious thoughts about hurting yourself or someone else.   This information is not intended to replace advice given to you by your health care provider. Make sure you discuss any questions you have with your health care provider.   Document Released: 07/25/2000 Document Revised: 04/18/2015 Document Reviewed: 04/28/2014 Elsevier Interactive Patient Education [employer].

## 2016-04-05 ENCOUNTER — Encounter: Payer: Self-pay | Admitting: Family Medicine

## 2016-04-05 DIAGNOSIS — IMO0001 Reserved for inherently not codable concepts without codable children: Secondary | ICD-10-CM | POA: Insufficient documentation

## 2016-04-05 DIAGNOSIS — G4700 Insomnia, unspecified: Secondary | ICD-10-CM | POA: Insufficient documentation

## 2016-04-05 DIAGNOSIS — I151 Hypertension secondary to other renal disorders: Secondary | ICD-10-CM | POA: Insufficient documentation

## 2016-04-05 DIAGNOSIS — F1020 Alcohol dependence, uncomplicated: Secondary | ICD-10-CM | POA: Insufficient documentation

## 2016-04-05 DIAGNOSIS — Z9111 Patient's noncompliance with dietary regimen: Secondary | ICD-10-CM | POA: Insufficient documentation

## 2016-04-05 DIAGNOSIS — Z91148 Patient's other noncompliance with medication regimen for other reason: Secondary | ICD-10-CM | POA: Insufficient documentation

## 2016-04-05 DIAGNOSIS — N049 Nephrotic syndrome with unspecified morphologic changes: Secondary | ICD-10-CM | POA: Insufficient documentation

## 2016-04-05 DIAGNOSIS — Z9114 Patient's other noncompliance with medication regimen: Secondary | ICD-10-CM

## 2016-04-05 NOTE — Assessment & Plan Note (Addendum)
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    It was not thought to be secondary to any cardiac reason for his volume overload as echocardiogram was normal and showed LVEF of 50-55% and mild LAE at that time.        --->  He has been volume overloaded more recently and has been working closely with his cardiology team and nephrology team to adjust diuretics, potassium supplements etc.     Recently seen by Richardson Dopp, PA-C on [phone]- who has been working with patient on his volume status and closely monitoring him.  Also at that time he started patient on by systolic 5 mg for his blood pressure, and asked patient to follow-up with nephrology.  >>ASSESSMENT AND PLAN FOR BERGER'S DISEASE: IGA NEPHROPATHY  WRITTEN ON 04/05/2016  4:34 PM BY Diannah Rindfleisch, DO  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

## 2016-04-05 NOTE — Assessment & Plan Note (Signed)
-   Patient supposed to be on [facility], but remains noncompliant and never picked up the prescription. Again denies that he is using rescue inhaler

## 2016-04-05 NOTE — Assessment & Plan Note (Signed)
He developed nephrotic syndrome and a renal biopsy was consistent with acute glomerulonephritis thought to be secondary to [employer]

## 2016-04-05 NOTE — Assessment & Plan Note (Addendum)
Patient was seen in the ED April 2017- requested help with sobriety.   He did not mention his alcohol history to me.   Claims to be not currently drinking, however I did mention how alcohol is a depressant can make his mood disorder worse.

## 2016-04-05 NOTE — Assessment & Plan Note (Signed)
He also has been treated by Dr Linus Salmons for chronic hepatitis C with [facility] in past.

## 2016-04-05 NOTE — Assessment & Plan Note (Addendum)
Sleep difficulties - secondary to generalized anxiety\ mood disorder.  - We'll start medications for his GAD.  Recommend CBT.  Consider low-dose trazodone of 25-50 in future or sonata 5mg   Sleep hygiene discussed with patient in detail: - Avoid caffeinated beverages after lunch,  no alcoholic beverages,  no eating within 2-3 hours of lying down,  avoid exposure to blue light before bed,  avoid daytime naps, and  needs to maintain a regular sleep schedule- go to sleep and wake up around the same time every night.   - Resolve concerns or worries before entering bedroom:  Discussed relaxation techniques with patient and to keep a journal to write down fears\ worries.  I suggested seeing a counselor for CBT.   - Recommend patient meditate or do deep breathing exercises to help relax.   Incorporate the use of white noise machines or listen to "sleep meditation music", or recordings of guided meditations for sleep from YouTube which are free, such as  "guided meditation for detachment from over thinking"  by Mayford Knife.

## 2016-04-05 NOTE — Assessment & Plan Note (Addendum)
Chronic lower extremity edema due to nephrotic syndrome.    Patient states his bilateral feet ache due to his Berger's disease- which affects his balance. left greater than right pain lower extremities when he ambulates. He says this is actually getting better. He walk about 100 yards before he has pain.  ABIs July 2016 were greater than 1.

## 2016-04-05 NOTE — Assessment & Plan Note (Addendum)
-   Risks of benzodiazepines discussed in detail with patient. Only use if he develops a panic attacks. Explained to patient I will not give him more than #30 every 6 months, and goal is to eventually be off these once GAD is well controlled with SSRI.  - Explained to patient that SSRIs can take up to several weeks to reach adequate blood levels in the body to be effective.

## 2016-04-05 NOTE — Assessment & Plan Note (Addendum)
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   Recommended followup by ultrasound was in 3 years.

## 2016-04-05 NOTE — Assessment & Plan Note (Signed)
Will need labs in near future- asked pt to make f/up lab-only appt for this today on his way out; then follow-up office visit with me one week later to discuss.

## 2016-04-05 NOTE — Assessment & Plan Note (Signed)
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

## 2016-04-05 NOTE — Assessment & Plan Note (Addendum)
-   pt will monitor at home and bring in log of BP for next OV. Encouraged to start the bysystolic that was prescribed by his cardiology team.   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   His blood pressure is being treated by his cardiology and nephrology providers.   George [name] / Dr [name]  and  Dr Florene Glen respectively  - No medications were discontinued today.  Patient endorsed that he is not taking bysystolic, Lasix or KCl supplements to [facility]MA today

## 2016-04-23 ENCOUNTER — Encounter: Payer: Self-pay | Admitting: Family Medicine

## 2016-04-23 ENCOUNTER — Ambulatory Visit (INDEPENDENT_AMBULATORY_CARE_PROVIDER_SITE_OTHER): Payer: Medicare Other | Admitting: Family Medicine

## 2016-04-23 VITALS — BP 143/87 | HR 75 | Ht 73.75 in | Wt 202.0 lb

## 2016-04-23 DIAGNOSIS — G4700 Insomnia, unspecified: Secondary | ICD-10-CM | POA: Diagnosis not present

## 2016-04-23 DIAGNOSIS — N049 Nephrotic syndrome with unspecified morphologic changes: Secondary | ICD-10-CM | POA: Diagnosis not present

## 2016-04-23 DIAGNOSIS — E038 Other specified hypothyroidism: Secondary | ICD-10-CM

## 2016-04-23 DIAGNOSIS — I151 Hypertension secondary to other renal disorders: Secondary | ICD-10-CM

## 2016-04-23 DIAGNOSIS — F411 Generalized anxiety disorder: Secondary | ICD-10-CM

## 2016-04-23 DIAGNOSIS — N289 Disorder of kidney and ureter, unspecified: Secondary | ICD-10-CM

## 2016-04-23 NOTE — Progress Notes (Signed)
Impression and Recommendations:    1. Insomnia   2. 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 h/o hypothyroidism   5. Berger's disease: IgA Nephropathy     - GAD, insomnia and panic- resolved now with restoration of sleep pt feels.  -  If patient does not need to take the Ambien, Celexa and Xanax, told him not to take them.  Only use Ambien and Xanax when necessary for sleep and panic attacks.  -  Blood pressure under good control at home. Patient will follow up with cardiology and nephrology for further management  -  Reminded patient that he did not make an office visit for fasting blood work with me yet. He was told to do so last office visit and again today. He told me he will make this appointment when he leaves today.  Berger's disease: IgA Nephropathy  Try to get me OV notes from Dr Florene Glen so I know what Renal is doing with you.    Patient's Medications  New Prescriptions   No medications on file  Previous Medications   ALPRAZOLAM (XANAX) 0.5 MG TABLET    Take 1 tablet daily as needed for panic attacks.   ASPIRIN EC 81 MG EC TABLET    Take 1 tablet (81 mg total) by mouth daily.   ASPIRIN-SALICYLAMIDE-CAFFEINE (BC HEADACHE POWDER PO)    Take 1 Package by mouth daily as needed (Body Aches).   FUROSEMIDE (LASIX) 80 MG TABLET    Take 80 mg by mouth daily.   LEVALBUTEROL (XOPENEX HFA) 45 MCG/ACT INHALER    Inhale 1-2 puffs into the lungs every 4 (four) hours as needed for wheezing.   ZOLPIDEM (AMBIEN CR) 12.5 MG CR TABLET    Take 1 tablet (12.5 mg total) by mouth at bedtime as needed for sleep.  Modified Medications   No medications on file  Discontinued Medications   CITALOPRAM (CELEXA) 20 MG TABLET    Start with one half a tablet daily for 1 week then increase to a full tablet daily    Return for f/up Near future- Fasting BldWrk and also 4 mo f/up OV with me for CPE.  The patient was counseled, risk factors were  discussed, anticipatory guidance given.  Gross side effects, risk and benefits, and alternatives of medications and treatment plan in general discussed with patient.  Patient is aware that all medications have potential side effects and we are unable to predict every side effect or drug-drug interaction that may occur.   Patient will call with any questions prior to using medication if they have concerns.  Expresses verbal understanding and consents to current therapy and treatment regimen.  No barriers to understanding were identified.  Red flag symptoms and signs discussed in detail.  Patient expressed understanding regarding what to do in case of emergency\urgent symptoms   Please see AVS handed out to patient at the end of our visit for further patient instructions/ counseling done pertaining to today's office visit.    Note: This document was prepared using Dragon voice recognition software and may include unintentional dictation errors.   --------------------------------------------------------------------------------------------------------------------------------------------------------------------------------------------------------------------------------------------    Subjective:    CC:  Chief Complaint  Patient presents with  . Follow-up    insomnia, anxiety    HPI: George [name] is a 65 y.o. male who presents to [place] at [facility] today for Follow-up for anxiety and insomnia.  Last office  visit I started patient on Celexa daily and Xanax when necessary for his panic attacks. I also started Ambien) nightly to help restore his sleep-wake cycles.  Insomnia/ anxiety/panic:   feeling much better- took the [place] for about one week- restored his sleep patterns so well that he could come off it.   Xanax was too strong and made him feel funny.  ONly took it twice.   But glad he has just in case. I told him he could take a half a tablet.  Stopped celexa once  sleep restored- his excess worry and anxiety when away.    BP at home--> checked daily- well controlled. No symptoms.  Several of them are 134/84, 123/80, 120/78, 139/88, 125/76, 143/82, 126/94, 126/74, 139/82.       Wt Readings from Last 3 Encounters:  04/23/16 202 lb (91.6 kg)  03/31/16 203 lb 8 oz (92.3 kg)  03/24/16 203 lb (92.1 kg)   BP Readings from Last 3 Encounters:  03/31/16 (!) 153/98  03/24/16 (!) 156/95  03/18/16 (!) 150/100   Pulse Readings from Last 3 Encounters:  04/23/16 75  03/31/16 84  03/24/16 84     Patient Active Problem List   Diagnosis Date Noted  . Alcoholism /alcohol abuse- episodic binges 04/05/2016    Priority: High  . 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 Insomnia 04/05/2016    Priority: High  . Noncompliance with diet and medication regimen 04/05/2016    Priority: High  . Signficant h/o tobacco abuse (150pk/yr hx) 03/03/2016    Priority: High  . h/o Basal cell carcinoma of nose 03/03/2016    Priority: High  . GAD (generalized anxiety disorder) 03/03/2016    Priority: High  . Hepatitis C infection 11/14/2015    Priority: High  . COPD mixed type ([place]) 10/12/2015    Priority: High  . h/o Hypothyroidism 09/22/2015    Priority: High  . 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 Berger's disease: IgA Nephropathy  10/12/2015    Priority: Medium  . Venous insufficiency of both lower extremities 09/21/2015    Priority: Medium  . H/O CHF 08/20/2015    Priority: Medium  . h/o SVT (supraventricular tachycardia) s/p Ablation 05/22/2015    Priority: Medium  . Liver fibrosis ([place]) 03/24/2016  . AAA -   (f/up US 12/2018) 09/24/2015    Past Medical history, Surgical history, Family history, Social history, Allergies and Medications have been entered into the medical record, reviewed and changed as needed.   Allergies:  No Known Allergies  Review of Systems: No fever/  chills, night sweats, no unintended weight loss, No chest pain, or increased shortness of breath. No N/V/D.  Pertinent positives and negatives noted in HPI above    Objective:   Pulse 75, height 6' 1.75" (1.873 m), weight 202 lb (91.6 kg). Body mass index is 26.11 kg/m. General: Well Developed, well nourished, appropriate for stated age.  Neuro: Alert and oriented, extra-ocular muscles intact HEENT: Normocephalic, atraumatic, pupils equal round reactive, neck supple Skin: Warm, pink and dry, no gross rash. Cardiac: rate regular Respiratory: Not using accessory muscles, speaking in full sentences-unlabored. Vascular:  cap RF less 2 sec. Psych: No HI/SI, judgement and insight good, Euthymic mood. Full Affect.

## 2016-04-23 NOTE — Assessment & Plan Note (Signed)
>>  ASSESSMENT AND PLAN FOR BERGER'S DISEASE: IGA NEPHROPATHY  WRITTEN ON 04/23/2016  2:07 PM BY OPALSKI, [place], DO  Try to get me OV notes from Dr Florene Glen so I know what Renal is doing with you.

## 2016-04-23 NOTE — Patient Instructions (Signed)
-  AHA guidelines for exercise of 150 minutes of moderate intensity aerobic activity per week discussed and encouraged.   Regular exercise will improve brain function and memory, as well as improve mood, boost immune system and help with weight management, among the other, more well-known effects of exercise such as decreasing risk for hypertension, diabetes, hyperlipidemia etc.  - Sleep hygiene discussed. Counseled on negative effects of "blue light" from items such as computer screens, smart phones, TVs etc.   -  The AHA strongly endorses consumption of a diet that contains a variety of foods from all the food categories with an emphasis on fruits and vegetables; fat-free and low-fat dairy products; cereal and grain products; legumes and nuts; and fish, poultry, and or lean meats.   Excessive food intake, especially of foods high in saturated and trans fats, sugar, and salt, should be avoided    Guidelines for a Low Sodium Diet   Low Sodium Diet A main source of sodium is table salt. The average American eats five or more teaspoons of salt each day. This is about 20 times as much as the body needs. In fact, your body needs only 1/4 teaspoon of salt every day. Sodium is found naturally in foods, but a lot of it is added during processing and preparation. Many foods that do not taste salty may still be high in sodium. Large amounts of sodium can be hidden in canned, processed and convenience foods. And sodium can be found in many foods that are served at [employer].  Sodium controls fluid balance in our bodies and maintains blood volume and blood pressure. Eating too much sodium may raise blood pressure and cause fluid retention, which could lead to swelling of the legs and feet or other health issues.  When limiting sodium in your diet, a common target is to eat less than 2,000 milligrams of sodium per day.   General Guidelines for Cutting Down on Salt Eliminate salty foods from your  diet and reduce the amount of salt used in cooking. Sea salt is no better than regular salt.  Choose low sodium foods. Many salt-free or reduced salt products are available. When reading food labels, low sodium is defined as 140 mg of sodium per serving.  Salt substitutes are sometimes made from potassium, so read the label. If you are on a low potassium diet, then check with your doctor before using those salt substitutes.  Be creative and season your foods with spices, herbs, lemon, garlic, ginger, vinegar and pepper. Remove the salt shaker from the table.  Read ingredient labels to identify foods high in sodium. Items with 400 mg or more of sodium are high in sodium. High sodium food additives include salt, brine, or other items that say sodium, such as monosodium glutamate.  Eat more home-cooked meals. Foods cooked from scratch are naturally lower in sodium than most instant and boxed mixes.  Don't use softened water for cooking and drinking since it contains added salt.  Avoid medications which contain sodium such as [employer].  For more information; food composition books are available which tell how much sodium is in food. Online sources such as www.calorieking.com also list amounts.     Meats, Poultry, Fish, Legumes, Eggs and Nuts  High-Sodium Foods: Smoked, cured, salted or canned meat, fish or poultry including bacon, cold cuts, ham, frankfurters, sausage, sardines, caviar and anchovies Frozen breaded meats and dinners, such as burritos and pizza Canned entrees, such as  ravioli, spam and chili Salted nuts Beans canned with salt added  Low-Sodium Alternatives: Any fresh or frozen beef, lamb, pork, poultry and fish Eggs and egg substitutes Low-sodium peanut butter Dry peas and beans (not canned) Low-sodium canned fish Drained, water or oil packed canned fish or poultry   Dairy Products  High-Sodium Foods: Buttermilk Regular and processed cheese,  cheese spreads and sauces Cottage cheese  Low-Sodium Alternatives: Milk, yogurt, ice cream and ice milk Low-sodium cheeses, cream cheese, ricotta cheese and mozzarella   Breads, Grains and Cereals  High-Sodium Foods: Bread and rolls with salted tops Quick breads, self-rising flour, biscuit, pancake and waffle mixes Pizza, croutons and salted crackers Prepackaged, processed mixes for potatoes, rice, pasta and stuffing  Low-Sodium Alternatives: Breads, bagels and rolls without salted tops Muffins and most ready-to-eat cereals All rice and pasta, but do not to add salt when cooking Low-sodium corn and flour tortillas and noodles Low-sodium crackers and breadsticks Unsalted popcorn, chips and pretzels      Vegetables and Fruits  High-Sodium Foods: Regular canned vegetables and vegetable juices Olives, pickles, sauerkraut and other pickled vegetables Vegetables made with ham, bacon or salted pork Packaged mixes, such as scalloped or au gratin potatoes, frozen hash browns and Tater Tots Commercially prepared pasta and tomato sauces and salsa  Low-Sodium Alternatives: Fresh and frozen vegetables without sauces Low-sodium canned vegetables, sauces and juices Fresh potatoes, frozen [place] fries and instant mashed potatoes Low-salt tomato or V-8 juice. Most fresh, frozen and canned fruit Dried fruits   Soups  High-Sodium Foods: Regular canned and dehydrated soup, broth and bouillon Cup of noodles and seasoned ramen mixes  Low-Sodium Alternatives: Low-sodium canned and dehydrated soups, broth and bouillon Homemade soups without added salt   Fats, Desserts and Sweets  High-Sodium Foods: Soy sauce, seasoning salt, other sauces and marinades Bottled salad dressings, regular salad dressing with bacon bits Salted butter or margarine Instant pudding and cake Large portions of ketchup, mustard  Low-Sodium Alternatives: Vinegar, unsalted butter or margarine Vegetable  oils and low sodium sauces and salad dressings Mayonnaise All desserts made without salt    Hypertension Hypertension, commonly called high blood pressure, is when the force of blood pumping through your arteries is too strong. Your arteries are the blood vessels that carry blood from your heart throughout your body. A blood pressure reading consists of a higher number over a lower number, such as 110/72. The higher number (systolic) is the pressure inside your arteries when your heart pumps. The lower number (diastolic) is the pressure inside your arteries when your heart relaxes. Ideally you want your blood pressure below 120/80. Hypertension forces your heart to work harder to pump blood. Your arteries may become narrow or stiff. Having untreated or uncontrolled hypertension can cause heart attack, stroke, kidney disease, and other problems. RISK FACTORS Some risk factors for high blood pressure are controllable. Others are not.  Risk factors you cannot control include:   Race. You may be at higher risk if you are African American.  Age. Risk increases with age.  Gender. Men are at higher risk than women before age 77 years. After age 42, women are at higher risk than men. Risk factors you can control include:  Not getting enough exercise or physical activity.  Being overweight.  Getting too much fat, sugar, calories, or salt in your diet.  Drinking too much alcohol. SIGNS AND SYMPTOMS Hypertension does not usually cause signs or symptoms. Extremely high blood pressure (hypertensive crisis) may cause headache, anxiety,  shortness of breath, and nosebleed. DIAGNOSIS To check if you have hypertension, your health care provider will measure your blood pressure while you are seated, with your arm held at the level of your heart. It should be measured at least twice using the same arm. Certain conditions can cause a difference in blood pressure between your right and left arms. A blood  pressure reading that is higher than normal on one occasion does not mean that you need treatment. If it is not clear whether you have high blood pressure, you may be asked to return on a different day to have your blood pressure checked again. Or, you may be asked to monitor your blood pressure at home for 1 or more weeks. TREATMENT Treating high blood pressure includes making lifestyle changes and possibly taking medicine. Living a healthy lifestyle can help lower high blood pressure. You may need to change some of your habits. Lifestyle changes may include:  Following the DASH diet. This diet is high in fruits, vegetables, and whole grains. It is low in salt, red meat, and added sugars.  Keep your sodium intake below 2,300 mg per day.  Getting at least 30-45 minutes of aerobic exercise at least 4 times per week.  Losing weight if necessary.  Not smoking.  Limiting alcoholic beverages.  Learning ways to reduce stress. Your health care provider may prescribe medicine if lifestyle changes are not enough to get your blood pressure under control, and if one of the following is true:  You are 35-46 years of age and your systolic blood pressure is above 140.  You are 40 years of age or older, and your systolic blood pressure is above 150.  Your diastolic blood pressure is above 90.  You have diabetes, and your systolic blood pressure is over [id number] or your diastolic blood pressure is over 90.  You have kidney disease and your blood pressure is above 140/90.  You have heart disease and your blood pressure is above 140/90. Your personal target blood pressure may vary depending on your medical conditions, your age, and other factors. HOME CARE INSTRUCTIONS  Have your blood pressure rechecked as directed by your health care provider.   Take medicines only as directed by your health care provider. Follow the directions carefully. Blood pressure medicines must be taken as prescribed. The  medicine does not work as well when you skip doses. Skipping doses also puts you at risk for problems.  Do not smoke.   Monitor your blood pressure at home as directed by your health care provider. SEEK MEDICAL CARE IF:   You think you are having a reaction to medicines taken.  You have recurrent headaches or feel dizzy.  You have swelling in your ankles.  You have trouble with your vision. SEEK IMMEDIATE MEDICAL CARE IF:  You develop a severe headache or confusion.  You have unusual weakness, numbness, or feel faint.  You have severe chest or abdominal pain.  You vomit repeatedly.  You have trouble breathing. MAKE SURE YOU:   Understand these instructions.  Will watch your condition.  Will get help right away if you are not doing well or get worse.   This information is not intended to replace advice given to you by your health care provider. Make sure you discuss any questions you have with your health care provider.   Document Released: 07/28/2005 Document Revised: 12/12/2014 Document Reviewed: 05/20/2013 Elsevier Interactive Patient Education [employer].

## 2016-04-23 NOTE — Assessment & Plan Note (Signed)
Try to get me OV notes from Dr Florene Glen so I know what Renal is doing with you.

## 2016-05-01 ENCOUNTER — Encounter: Payer: Self-pay | Admitting: Physician Assistant

## 2016-05-07 ENCOUNTER — Other Ambulatory Visit (INDEPENDENT_AMBULATORY_CARE_PROVIDER_SITE_OTHER): Payer: Medicare Other

## 2016-05-07 DIAGNOSIS — N049 Nephrotic syndrome with unspecified morphologic changes: Secondary | ICD-10-CM

## 2016-05-07 DIAGNOSIS — F411 Generalized anxiety disorder: Secondary | ICD-10-CM

## 2016-05-07 DIAGNOSIS — I151 Hypertension secondary to other renal disorders: Secondary | ICD-10-CM

## 2016-05-07 DIAGNOSIS — E038 Other specified hypothyroidism: Secondary | ICD-10-CM

## 2016-05-07 DIAGNOSIS — G4700 Insomnia, unspecified: Secondary | ICD-10-CM

## 2016-05-07 DIAGNOSIS — N289 Disorder of kidney and ureter, unspecified: Secondary | ICD-10-CM

## 2016-05-07 DIAGNOSIS — J449 Chronic obstructive pulmonary disease, unspecified: Secondary | ICD-10-CM

## 2016-05-08 LAB — CBC WITH DIFFERENTIAL/PLATELET
BASOS ABS: 70 {cells}/uL (ref 0–200)
Basophils Relative: 1 %
EOS PCT: 5 %
Eosinophils Absolute: 350 cells/uL (ref 15–500)
HCT: 36.8 % — ABNORMAL LOW (ref 38.5–50.0)
Hemoglobin: 12 g/dL — ABNORMAL LOW (ref 13.2–17.1)
LYMPHS PCT: 16 %
Lymphs Abs: 1120 cells/uL (ref 850–3900)
MCH: 28 pg (ref 27.0–33.0)
MCHC: 32.6 g/dL (ref 32.0–36.0)
MCV: 86 fL (ref 80.0–100.0)
MONOS PCT: 7 %
MPV: 9.4 fL (ref 7.5–12.5)
Monocytes Absolute: 490 cells/uL (ref 200–950)
NEUTROS PCT: 71 %
Neutro Abs: 4970 cells/uL (ref 1500–7800)
Platelets: 242 10*3/uL (ref 140–400)
RBC: 4.28 MIL/uL (ref 4.20–5.80)
RDW: 15.6 % — AB (ref 11.0–15.0)
WBC: 7 10*3/uL (ref 3.8–10.8)

## 2016-05-08 LAB — LIPID PANEL
CHOLESTEROL: 160 mg/dL (ref 125–200)
HDL: 40 mg/dL (ref >=40)
LDL Cholesterol: 99 mg/dL (ref <130)
TRIGLYCERIDES: 106 mg/dL (ref <150)
Total CHOL/HDL Ratio: 4 Ratio (ref <=5.0)
VLDL: 21 mg/dL (ref <30)

## 2016-05-08 LAB — VITAMIN B12: VITAMIN B 12: 397 pg/mL (ref 200–1100)

## 2016-05-08 LAB — HEMOGLOBIN A1C
Hgb A1c MFr Bld: 4.8 % (ref <5.7)
MEAN PLASMA GLUCOSE: 91 mg/dL

## 2016-05-08 LAB — T4, FREE: Free T4: 0.8 ng/dL (ref 0.8–1.8)

## 2016-05-08 LAB — TSH: TSH: 3.39 m[IU]/L (ref 0.40–4.50)

## 2016-05-08 LAB — VITAMIN D 25 HYDROXY (VIT D DEFICIENCY, FRACTURES): VIT D 25 HYDROXY: 22 ng/mL — AB (ref 30–100)

## 2016-05-19 ENCOUNTER — Encounter (INDEPENDENT_AMBULATORY_CARE_PROVIDER_SITE_OTHER): Payer: Self-pay

## 2016-05-19 ENCOUNTER — Ambulatory Visit (INDEPENDENT_AMBULATORY_CARE_PROVIDER_SITE_OTHER): Payer: Medicare Other | Admitting: Physician Assistant

## 2016-05-19 ENCOUNTER — Encounter: Payer: Self-pay | Admitting: Physician Assistant

## 2016-05-19 VITALS — BP 140/80 | HR 80 | Ht 76.0 in | Wt 212.4 lb

## 2016-05-19 DIAGNOSIS — N049 Nephrotic syndrome with unspecified morphologic changes: Secondary | ICD-10-CM | POA: Diagnosis not present

## 2016-05-19 DIAGNOSIS — I471 Supraventricular tachycardia: Secondary | ICD-10-CM

## 2016-05-19 DIAGNOSIS — E559 Vitamin D deficiency, unspecified: Secondary | ICD-10-CM | POA: Insufficient documentation

## 2016-05-19 NOTE — Patient Instructions (Addendum)
Medication Instructions:  Your physician recommends that you continue on your current medications as directed. Please refer to the Current Medication list given to you today.  If you need a refill on your cardiac medications before your next appointment, please call your pharmacy.  Labwork: NONE ORDER TODAY  Testing/Procedures: NONE ORDER TODAY  Follow-Up:  Your physician wants you to follow-up in: [place] will receive a reminder letter in the mail two months in advance. If you don't receive a letter, please call our office to schedule the follow-up appointment. Any Other Special Instructions Will Be Listed Below (If Applicable).

## 2016-05-19 NOTE — Progress Notes (Signed)
Cardiology Office Note:    Date:  05/19/2016   ID:  George [name], DOB 05/06/1951, MRN [id number]  PCP:  [name], DO  Cardiologist: Dr. Quay Burow  Electrophysiologist: Dr. Cristopher Peru  Nephrologist - Dr. Florene Glen.  Referring MD: [name], DO   Chief Complaint  Patient presents with  . Follow-up    Anasarca, HTN    History of Present Illness:    George [name] is a 65 y.o. male with a hx of PSVT s/p RFCA in 1/17 with Dr. Lovena Le, COPD, HTN, chronic LE extremity edema from venous insufficiency with assoc venous stasis ulcers, Berger's disease, HCV s/p tx with Harvoni.   In 10/16 he was admitted with SVT and elevated Troponin levels. Myoview was low risk.  In 1/17 he had recurrent SVT while admitted with AECOPD.  Echo demonstrated EF 40-45% with diffuse HK. 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 Cristopher Peru.   In 2/17, he was admitted with acute respiratory failure and anasarca in the setting of nephrotic syndrome. Pulmonary edema was noted on CXR. Protein >300 on UA. 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 He was followed by nephrology and maintained on high-dose diuretics.  Last seen here by me in 8/17. He had been volume overloaded and I adjusted his diuretics.  I also put him back on Bystolic for his blood pressure.  He returns for FU.  Here alone. He is now on prn Lasix.  He is no longer taking Bystolic.  He is feeling much better.  He denies edema or increasing abdominal girth.  He denies chest pain, shortness of breath.  He is sleeping better.  He took xanax and another medication for a week, then stopped.  He has slept well since then.     Prior CV studies that were reviewed today include:    Abdominal US 12/19/15 Abdominal aorta: Measures up to 3.1 cm  Echo 09/21/15 EF 50-55%, mild LAE  Echo 08/22/15 Mod LVH, EF 40-45%, diff HK, mild MR  Myoview 10/16 1. Inferior wall attenuation on stress and rest imaging  probably due to diaphragmatic attenuation given the lack of abnormal wall motion in this vicinity. Scarring of the inferior wall is a differential diagnostic consideration. No inducible ischemia. 2. Normal left ventricular wall motion. 3. Left ventricular ejection fraction 65% 4. Low-risk stress test findings*.  Past Medical History:  Diagnosis Date  . AAA (abdominal aortic aneurysm) ([place])    a. [place] 09/2015 - 3cm distal AAA. //  b. [place] 5/17: 3.1 cm   . Basal cell carcinoma of right side of nose   . Berger's disease   . CKD (chronic kidney disease) 09/2015  . GERD (gastroesophageal reflux disease)   . Nephrotic syndrome   . Panic attacks   . SVT (supraventricular tachycardia) ([place]) 05/22/2015    Past Surgical History:  Procedure Laterality Date  . BASAL CELL CARCINOMA EXCISION Right ~ 06/2015   "side of my nose"  . ELECTROPHYSIOLOGIC STUDY N/A 09/10/2015   Procedure: SVT Ablation;  Surgeon: Evans Lance, MD;  Location: [facility];  Service: Cardiovascular;  Laterality: N/A;  . FEMUR FRACTURE SURGERY Right 1970   "had plate & pin put in"  . FEMUR HARDWARE REMOVAL Right 1971   "removed plate; left pin in  . FRACTURE SURGERY    . KNEE ARTHROSCOPY Right ~ 1985  . TONSILLECTOMY AND ADENOIDECTOMY  ~ 1955    Current Medications: Current Meds  Medication Sig  . aspirin  EC 81 MG EC tablet Take 1 tablet (81 mg total) by mouth daily.  . Aspirin-Salicylamide-Caffeine (BC HEADACHE POWDER PO) Take 1 Package by mouth daily as needed (Body Aches).  . furosemide (LASIX) 80 MG tablet Take 80 mg by mouth daily as needed for fluid or edema.   . levalbuterol (XOPENEX HFA) 45 MCG/ACT inhaler Inhale 1-2 puffs into the lungs every 4 (four) hours as needed for wheezing.     Allergies:   Review of patient's allergies indicates no known allergies.   Social History   Social History  . Marital status: Divorced    Spouse name: N/A  . Number of children: 0  . Years of education: 12    Occupational History  . Retired    Social History Main Topics  . Smoking status: Former Smoker    Packs/day: 2.00    Years: 50.00    Types: Cigarettes    Quit date: 01/24/2015  . Smokeless tobacco: Never Used  . Alcohol use 1.8 - 2.4 oz/week    3 - 4 Shots of liquor per week  . Drug use: No  . Sexual activity: Not Currently   Other Topics Concern  . None   Social History Narrative   Fun: Trade out work, outdoors   Denies religious beliefs effecting health care.      Family History:  The patient's family history includes Cancer in his mother; Diabetes in his paternal grandmother; Healthy in his maternal grandfather, maternal grandmother, and paternal grandfather; Heart attack in his father and mother; Heart disease in his father and mother; Heart failure in his father; Hyperlipidemia in his father; Stroke in his mother.   ROS:   Please see the history of present illness.    ROS All other systems reviewed and are negative.   EKGs/Labs/Other Test Reviewed:    EKG:  EKG is  ordered today.  The ekg ordered today demonstrates NSR, HR 80, LAD, NSSTTW changes, QTc 470 ms, no change from prior tracing   Recent Labs: 08/29/2015: Pro B Natriuretic peptide (BNP) 894.0 09/20/2015: Magnesium 2.0 10/16/2015: Brain Natriuretic Peptide 527.7 02/22/2016: ALT 8 03/18/2016: BUN 27; Creat 1.12; Potassium 4.6; Sodium 140 05/07/2016: Hemoglobin 12.0; Platelets 242; TSH 3.39   Recent Lipid Panel    Component Value Date/Time   CHOL 160 05/07/2016 0801   TRIG 106 05/07/2016 0801   HDL 40 05/07/2016 0801   CHOLHDL 4.0 05/07/2016 0801   VLDL 21 05/07/2016 0801   LDLCALC 99 05/07/2016 0801     Physical Exam:    VS:  BP 140/80   Pulse 80   Ht 6\' 4"  (1.93 m)   Wt 212 lb 6.4 oz (96.3 kg)   BMI 25.85 kg/m     Wt Readings from Last 3 Encounters:  05/19/16 212 lb 6.4 oz (96.3 kg)  04/23/16 202 lb (91.6 kg)  03/31/16 203 lb 8 oz (92.3 kg)     Physical Exam  Constitutional: He is oriented  to person, place, and time. He appears well-developed and well-nourished. No distress.  HENT:  Head: Normocephalic and atraumatic.  Eyes: No scleral icterus.  Neck: No JVD present.  Cardiovascular: Normal rate, regular rhythm and normal heart sounds.   No murmur heard. Pulmonary/Chest: Effort normal. He has no wheezes. He has no rales.  Abdominal: Soft. There is no tenderness.  Musculoskeletal: He exhibits no edema.  Neurological: He is alert and oriented to person, place, and time.  Skin: Skin is warm and dry.  Psychiatric: He has  a normal mood and affect.    ASSESSMENT:    1. Nephrotic syndrome   2. SVT (supraventricular tachycardia) ([facility])    PLAN:    In order of problems listed above:  1. Nephrotic syndrome - Prior renal bx c/w Glomerulonephritis.This is the primary cause of his volume overload.  His current volume status is improved.  His BP is controlled. He now takes Furosemide prn.  Continue current Rx. FU with Nephrology as planned.   2. PSVT - s/p RFCA.  No apparent recurrence.     Medication Adjustments/Labs and Tests Ordered: Current medicines are reviewed at length with the patient today.  Concerns regarding medicines are outlined above.  Medication changes, Labs and Tests ordered today are outlined in the Patient Instructions noted below. Patient Instructions  Medication Instructions:  Your physician recommends that you continue on your current medications as directed. Please refer to the Current Medication list given to you today.  If you need a refill on your cardiac medications before your next appointment, please call your pharmacy.  Labwork: NONE ORDER TODAY  Testing/Procedures: NONE ORDER TODAY  Follow-Up:  Your physician wants you to follow-up in: [place] will receive a reminder letter in the mail two months in advance. If you don't receive a letter, please call our office to schedule the follow-up appointment. Any Other Special  Instructions Will Be Listed Below (If Applicable).  Signed, Richardson Dopp, PA-C  05/19/2016 2:04 PM    [facility] [place], [place],   [address] Phone: [phone]; Fax: [phone]

## 2016-06-15 ENCOUNTER — Other Ambulatory Visit: Payer: Self-pay | Admitting: Physician Assistant

## 2016-06-15 DIAGNOSIS — N183 Chronic kidney disease, stage 3 unspecified: Secondary | ICD-10-CM

## 2016-06-16 ENCOUNTER — Telehealth: Payer: Self-pay | Admitting: *Deleted

## 2016-06-16 NOTE — Telephone Encounter (Signed)
Pharmacy requesting metolazone. Please advise if patient should be still taking. Medication is not on med list.

## 2016-06-16 NOTE — Telephone Encounter (Signed)
Dr. [name] pt. Please advise

## 2016-06-26 ENCOUNTER — Ambulatory Visit (INDEPENDENT_AMBULATORY_CARE_PROVIDER_SITE_OTHER): Payer: Medicare Other | Admitting: Internal Medicine

## 2016-06-26 ENCOUNTER — Encounter: Payer: Self-pay | Admitting: Internal Medicine

## 2016-06-26 VITALS — BP 144/89 | HR 76 | Temp 98.1°F | Ht 76.0 in | Wt 218.0 lb

## 2016-06-26 DIAGNOSIS — B1920 Unspecified viral hepatitis C without hepatic coma: Secondary | ICD-10-CM | POA: Diagnosis not present

## 2016-06-26 DIAGNOSIS — K7400 Hepatic fibrosis, unspecified: Secondary | ICD-10-CM

## 2016-06-27 NOTE — Assessment & Plan Note (Signed)
SVR 12 today to confirm cure.  Some reports of failure after SVR12 so I will ask his PCP to recheck his HCV RNA viral load in 6-12 months one last time. He also will need hepatitis A and B series which I did not start.    He will follow up as needed.

## 2016-06-27 NOTE — Assessment & Plan Note (Signed)
Moderate fibrosis and no cirrhosis on ultrasound.  No indication for [place] screening.

## 2016-06-27 NOTE — Progress Notes (Signed)
   Subjective:    Patient ID: George [name], male    DOB: 16-Sep-1950, 65 y.o.   MRN: [id number]  HPI Here for follow up of HCV.   He has genotype 1a, now completed Harvoni for 12 weeks and here for SVR 12.  No issues.  Pleased with getting medication.  Has F2/3 on elastography.  Continues to be alcohol free. HCV viral load negative after treatment.  Hepatitis A and B non immune.     Review of Systems  Constitutional: Negative for activity change and fatigue.  Skin: Negative for rash.  Neurological: Negative for headaches.       Objective:   Physical Exam  Constitutional: He appears well-developed and well-nourished. No distress.  Eyes: No scleral icterus.  Cardiovascular: Normal rate, regular rhythm and normal heart sounds.   Skin: No rash noted.          Assessment & Plan:

## 2016-06-30 LAB — HEPATITIS C RNA QUANTITATIVE: HCV Quantitative: NOT DETECTED IU/mL (ref <15)

## 2016-08-25 ENCOUNTER — Encounter: Payer: Self-pay | Admitting: Family Medicine

## 2016-08-25 ENCOUNTER — Ambulatory Visit (INDEPENDENT_AMBULATORY_CARE_PROVIDER_SITE_OTHER): Payer: Medicare Other | Admitting: Family Medicine

## 2016-08-25 VITALS — BP 136/81 | HR 73 | Ht 73.5 in | Wt 226.9 lb

## 2016-08-25 DIAGNOSIS — Z1212 Encounter for screening for malignant neoplasm of rectum: Secondary | ICD-10-CM

## 2016-08-25 DIAGNOSIS — Z0000 Encounter for general adult medical examination without abnormal findings: Secondary | ICD-10-CM

## 2016-08-25 DIAGNOSIS — Z7189 Other specified counseling: Secondary | ICD-10-CM

## 2016-08-25 DIAGNOSIS — Z125 Encounter for screening for malignant neoplasm of prostate: Secondary | ICD-10-CM | POA: Diagnosis not present

## 2016-08-25 DIAGNOSIS — Z1211 Encounter for screening for malignant neoplasm of colon: Secondary | ICD-10-CM | POA: Diagnosis not present

## 2016-08-25 LAB — POC HEMOCCULT BLD/STL (OFFICE/1-CARD/DIAGNOSTIC): FECAL OCCULT BLD: NEGATIVE

## 2016-08-25 NOTE — Patient Instructions (Addendum)
Call your insurance company regarding Cologuard and Zostavax. - Please see the handout we gave you on the Cologuard test.   Preventive Care for Adults, Male A healthy lifestyle and preventive care can promote health and wellness. Preventive health guidelines for men include the following key practices:  A routine yearly physical is a good way to check with your health care provider about your health and preventative screening. It is a chance to share any concerns and updates on your health and to receive a thorough exam.  Visit your dentist for a routine exam and preventative care every 6 months. Brush your teeth twice a day and floss once a day. Good oral hygiene prevents tooth decay and gum disease.  The frequency of eye exams is based on your age, health, family medical history, use of contact lenses, and other factors. Follow your health care provider's recommendations for frequency of eye exams.  Eat a healthy diet. Foods such as vegetables, fruits, whole grains, low-fat dairy products, and lean protein foods contain the nutrients you need without too many calories. Decrease your intake of foods high in solid fats, added sugars, and salt. Eat the right amount of calories for you.Get information about a proper diet from your health care provider, if necessary.  Regular physical exercise is one of the most important things you can do for your health. Most adults should get at least 150 minutes of moderate-intensity exercise (any activity that increases your heart rate and causes you to sweat) each week. In addition, most adults need muscle-strengthening exercises on 2 or more days a week.  Maintain a healthy weight. The body mass index (BMI) is a screening tool to identify possible weight problems. It provides an estimate of body fat based on height and weight. Your health care provider can find your BMI and can help you achieve or maintain a healthy weight.For adults 20 years and older:  A  BMI below 18.5 is considered underweight.  A BMI of 18.5 to 24.9 is normal.  A BMI of 25 to 29.9 is considered overweight.  A BMI of 30 and above is considered obese.  Maintain normal blood lipids and cholesterol levels by exercising and minimizing your intake of saturated fat. Eat a balanced diet with plenty of fruit and vegetables. Blood tests for lipids and cholesterol should begin at age 76 and be repeated every 5 years. If your lipid or cholesterol levels are high, you are over 50, or you are at high risk for heart disease, you may need your cholesterol levels checked more frequently.Ongoing high lipid and cholesterol levels should be treated with medicines if diet and exercise are not working.  If you smoke, find out from your health care provider how to quit. If you do not use tobacco, do not start.  Lung cancer screening is recommended for adults aged 28-80 years who are at high risk for developing lung cancer because of a history of smoking. A yearly low-dose CT scan of the lungs is recommended for people who have at least a 30-pack-year history of smoking and are a current smoker or have quit within the past 15 years. A pack year of smoking is smoking an average of 1 pack of cigarettes a day for 1 year (for example: 1 pack a day for 30 years or 2 packs a day for 15 years). Yearly screening should continue until the smoker has stopped smoking for at least 15 years. Yearly screening should be stopped for people who develop a  health problem that would prevent them from having lung cancer treatment.  If you choose to drink alcohol, do not have more than 2 drinks per day. One drink is considered to be 12 ounces (355 mL) of beer, 5 ounces (148 mL) of wine, or 1.5 ounces (44 mL) of liquor.  Avoid use of street drugs. Do not share needles with anyone. Ask for help if you need support or instructions about stopping the use of drugs.  High blood pressure causes heart disease and increases the risk  of stroke. Your blood pressure should be checked at least every 1-2 years. Ongoing high blood pressure should be treated with medicines, if weight loss and exercise are not effective.  If you are 13-107 years old, ask your health care provider if you should take aspirin to prevent heart disease.  Diabetes screening is done by taking a blood sample to check your blood glucose level after you have not eaten for a certain period of time (fasting). If you are not overweight and you do not have risk factors for diabetes, you should be screened once every 3 years starting at age 41. If you are overweight or obese and you are 67-72 years of age, you should be screened for diabetes every year as part of your cardiovascular risk assessment.  Colorectal cancer can be detected and often prevented. Most routine colorectal cancer screening begins at the age of 62 and continues through age 79. However, your health care provider may recommend screening at an earlier age if you have risk factors for colon cancer. On a yearly basis, your health care provider may provide home test kits to check for hidden blood in the stool. Use of a small camera at the end of a tube to directly examine the colon (sigmoidoscopy or colonoscopy) can detect the earliest forms of colorectal cancer. Talk to your health care provider about this at age 52, when routine screening begins. Direct exam of the colon should be repeated every 5-10 years through age 39, unless early forms of precancerous polyps or small growths are found.  People who are at an increased risk for hepatitis B should be screened for this virus. You are considered at high risk for hepatitis B if:  You were born in a country where hepatitis B occurs often. Talk with your health care provider about which countries are considered high risk.  Your parents were born in a high-risk country and you have not received a shot to protect against hepatitis B (hepatitis B  vaccine).  You have HIV or AIDS.  You use needles to inject street drugs.  You live with, or have sex with, someone who has hepatitis B.  You are a man who has sex with other men (MSM).  You get hemodialysis treatment.  You take certain medicines for conditions such as cancer, organ transplantation, and autoimmune conditions.  Hepatitis C blood testing is recommended for all people born from [birth year] through 1965 and any individual with known risks for hepatitis C.  Practice safe sex. Use condoms and avoid high-risk sexual practices to reduce the spread of sexually transmitted infections (STIs). STIs include gonorrhea, chlamydia, syphilis, trichomonas, herpes, HPV, and human immunodeficiency virus (HIV). Herpes, HIV, and HPV are viral illnesses that have no cure. They can result in disability, cancer, and death.  If you are a man who has sex with other men, you should be screened at least once per year for:  HIV.  Urethral, rectal, and pharyngeal infection of  gonorrhea, chlamydia, or both.  If you are at risk of being infected with HIV, it is recommended that you take a prescription medicine daily to prevent HIV infection. This is called preexposure prophylaxis (PrEP). You are considered at risk if:  You are a man who has sex with other men (MSM) and have other risk factors.  You are a heterosexual man, are sexually active, and are at increased risk for HIV infection.  You take drugs by injection.  You are sexually active with a partner who has HIV.  Talk with your health care provider about whether you are at high risk of being infected with HIV. If you choose to begin PrEP, you should first be tested for HIV. You should then be tested every 3 months for as long as you are taking PrEP.  A one-time screening for abdominal aortic aneurysm (AAA) and surgical repair of large AAAs by ultrasound are recommended for men ages 82 to 71 years who are current or former smokers.  Healthy men  should no longer receive prostate-specific antigen (PSA) blood tests as part of routine cancer screening. Talk with your health care provider about prostate cancer screening.  Testicular cancer screening is not recommended for adult males who have no symptoms. Screening includes self-exam, a health care provider exam, and other screening tests. Consult with your health care provider about any symptoms you have or any concerns you have about testicular cancer.  Use sunscreen. Apply sunscreen liberally and repeatedly throughout the day. You should seek shade when your shadow is shorter than you. Protect yourself by wearing long sleeves, pants, a wide-brimmed hat, and sunglasses year round, whenever you are outdoors.  Once a month, do a whole-body skin exam, using a mirror to look at the skin on your back. Tell your health care provider about new moles, moles that have irregular borders, moles that are larger than a pencil eraser, or moles that have changed in shape or color.  Stay current with required vaccines (immunizations).  Influenza vaccine. All adults should be immunized every year.  Tetanus, diphtheria, and acellular pertussis (Td, Tdap) vaccine. An adult who has not previously received Tdap or who does not know his vaccine status should receive 1 dose of Tdap. This initial dose should be followed by tetanus and diphtheria toxoids (Td) booster doses every 10 years. Adults with an unknown or incomplete history of completing a 3-dose immunization series with Td-containing vaccines should begin or complete a primary immunization series including a Tdap dose. Adults should receive a Td booster every 10 years.  Varicella vaccine. An adult without evidence of immunity to varicella should receive 2 doses or a second dose if he has previously received 1 dose.  Human papillomavirus (HPV) vaccine. Males aged 11-21 years who have not received the vaccine previously should receive the 3-dose series. Males  aged 22-26 years may be immunized. Immunization is recommended through the age of 26 years for any male who has sex with males and did not get any or all doses earlier. Immunization is recommended for any person with an immunocompromised condition through the age of 2 years if he did not get any or all doses earlier. During the 3-dose series, the second dose should be obtained 4-8 weeks after the first dose. The third dose should be obtained 24 weeks after the first dose and 16 weeks after the second dose.  Zoster vaccine. One dose is recommended for adults aged 92 years or older unless certain conditions are present.  Measles, mumps, and rubella (MMR) vaccine. Adults born before [birth year] generally are considered immune to measles and mumps. Adults born in [birth year] or later should have 1 or more doses of MMR vaccine unless there is a contraindication to the vaccine or there is laboratory evidence of immunity to each of the three diseases. A routine second dose of MMR vaccine should be obtained at least 28 days after the first dose for students attending postsecondary schools, health care workers, or international travelers. People who received inactivated measles vaccine or an unknown type of measles vaccine during 1963-1967 should receive 2 doses of MMR vaccine. People who received inactivated mumps vaccine or an unknown type of mumps vaccine before 1979 and are at high risk for mumps infection should consider immunization with 2 doses of MMR vaccine. Unvaccinated health care workers born before [birth year] who lack laboratory evidence of measles, mumps, or rubella immunity or laboratory confirmation of disease should consider measles and mumps immunization with 2 doses of MMR vaccine or rubella immunization with 1 dose of MMR vaccine.  Pneumococcal 13-valent conjugate (PCV13) vaccine. When indicated, a person who is uncertain of his immunization history and has no record of immunization should receive the PCV13 vaccine.  All adults 45 years of age and older should receive this vaccine. An adult aged 71 years or older who has certain medical conditions and has not been previously immunized should receive 1 dose of PCV13 vaccine. This PCV13 should be followed with a dose of pneumococcal polysaccharide (PPSV23) vaccine. Adults who are at high risk for pneumococcal disease should obtain the PPSV23 vaccine at least 8 weeks after the dose of PCV13 vaccine. Adults older than 68 years of age who have normal immune system function should obtain the PPSV23 vaccine dose at least 1 year after the dose of PCV13 vaccine.  Pneumococcal polysaccharide (PPSV23) vaccine. When PCV13 is also indicated, PCV13 should be obtained first. All adults aged 63 years and older should be immunized. An adult younger than age 26 years who has certain medical conditions should be immunized. Any person who resides in a nursing home or long-term care facility should be immunized. An adult smoker should be immunized. People with an immunocompromised condition and certain other conditions should receive both PCV13 and PPSV23 vaccines. People with human immunodeficiency virus (HIV) infection should be immunized as soon as possible after diagnosis. Immunization during chemotherapy or radiation therapy should be avoided. Routine use of PPSV23 vaccine is not recommended for American Indians, [place] Natives, or people younger than 65 years unless there are medical conditions that require PPSV23 vaccine. When indicated, people who have unknown immunization and have no record of immunization should receive PPSV23 vaccine. One-time revaccination 5 years after the first dose of PPSV23 is recommended for people aged 19-64 years who have chronic kidney failure, nephrotic syndrome, asplenia, or immunocompromised conditions. People who received 1-2 doses of PPSV23 before age 37 years should receive another dose of PPSV23 vaccine at age 61 years or later if at least 5 years have  passed since the previous dose. Doses of PPSV23 are not needed for people immunized with PPSV23 at or after age 20 years.  Meningococcal vaccine. Adults with asplenia or persistent complement component deficiencies should receive 2 doses of quadrivalent meningococcal conjugate (MenACWY-D) vaccine. The doses should be obtained at least 2 months apart. Microbiologists working with certain meningococcal bacteria, [employer] recruits, people at risk during an outbreak, and people who travel to or live in countries with a high rate of meningitis  should be immunized. A first-year college student up through age 74 years who is living in a residence hall should receive a dose if he did not receive a dose on or after his 16th birthday. Adults who have certain high-risk conditions should receive one or more doses of vaccine.  Hepatitis A vaccine. Adults who wish to be protected from this disease, have chronic liver disease, work with hepatitis A-infected animals, work in hepatitis A research labs, or travel to or work in countries with a high rate of hepatitis A should be immunized. Adults who were previously unvaccinated and who anticipate close contact with an international adoptee during the first 60 days after arrival in the [place] from a country with a high rate of hepatitis A should be immunized.  Hepatitis B vaccine. Adults should be immunized if they wish to be protected from this disease, are under age 27 years and have diabetes, have chronic liver disease, have had more than one sex partner in the past 6 months, may be exposed to blood or other infectious body fluids, are household contacts or sex partners of hepatitis B positive people, are clients or workers in certain care facilities, or travel to or work in countries with a high rate of hepatitis B.  Haemophilus influenzae type b (Hib) vaccine. A previously unvaccinated person with asplenia or sickle cell disease or having a scheduled splenectomy  should receive 1 dose of Hib vaccine. Regardless of previous immunization, a recipient of a hematopoietic stem cell transplant should receive a 3-dose series 6-12 months after his successful transplant. Hib vaccine is not recommended for adults with HIV infection. Preventive Service / Frequency Ages 64 to 86  Blood pressure check.** / Every 3-5 years.  Lipid and cholesterol check.** / Every 5 years beginning at age 48.  Hepatitis C blood test.** / For any individual with known risks for hepatitis C.  Skin self-exam. / Monthly.  Influenza vaccine. / Every year.  Tetanus, diphtheria, and acellular pertussis (Tdap, Td) vaccine.** / Consult your health care provider. 1 dose of Td every 10 years.  Varicella vaccine.** / Consult your health care provider.  HPV vaccine. / 3 doses over 6 months, if 3 or younger.  Measles, mumps, rubella (MMR) vaccine.** / You need at least 1 dose of MMR if you were born in 1957 or later. You may also need a second dose.  Pneumococcal 13-valent conjugate (PCV13) vaccine.** / Consult your health care provider.  Pneumococcal polysaccharide (PPSV23) vaccine.** / 1 to 2 doses if you smoke cigarettes or if you have certain conditions.  Meningococcal vaccine.** / 1 dose if you are age 70 to 76 years and a [employer] researcher living in a residence hall, or have one of several medical conditions. You may also need additional booster doses.  Hepatitis A vaccine.** / Consult your health care provider.  Hepatitis B vaccine.** / Consult your health care provider.  Haemophilus influenzae type b (Hib) vaccine.** / Consult your health care provider. Ages 61 to 46  Blood pressure check.** / Every year.  Lipid and cholesterol check.** / Every 5 years beginning at age 59.  Lung cancer screening. / Every year if you are aged 65-80 years and have a 30-pack-year history of smoking and currently smoke or have quit within the past 15 years. Yearly screening is stopped  once you have quit smoking for at least 15 years or develop a health problem that would prevent you from having lung cancer treatment.  Fecal occult blood  test (FOBT) of stool. / Every year beginning at age 39 and continuing until age 64. You may not have to do this test if you get a colonoscopy every 10 years.  Flexible sigmoidoscopy** or colonoscopy.** / Every 5 years for a flexible sigmoidoscopy or every 10 years for a colonoscopy beginning at age 24 and continuing until age 31.  Hepatitis C blood test.** / For all people born from [birth year] through 1965 and any individual with known risks for hepatitis C.  Skin self-exam. / Monthly.  Influenza vaccine. / Every year.  Tetanus, diphtheria, and acellular pertussis (Tdap/Td) vaccine.** / Consult your health care provider. 1 dose of Td every 10 years.  Varicella vaccine.** / Consult your health care provider.  Zoster vaccine.** / 1 dose for adults aged 55 years or older.  Measles, mumps, rubella (MMR) vaccine.** / You need at least 1 dose of MMR if you were born in 1959 or later. You may also need a second dose.  Pneumococcal 13-valent conjugate (PCV13) vaccine.** / Consult your health care provider.  Pneumococcal polysaccharide (PPSV23) vaccine.** / 1 to 2 doses if you smoke cigarettes or if you have certain conditions.  Meningococcal vaccine.** / Consult your health care provider.  Hepatitis A vaccine.** / Consult your health care provider.  Hepatitis B vaccine.** / Consult your health care provider.  Haemophilus influenzae type b (Hib) vaccine.** / Consult your health care provider. Ages 67 and over  Blood pressure check.** / Every year.  Lipid and cholesterol check.**/ Every 5 years beginning at age 38.  Lung cancer screening. / Every year if you are aged 28-80 years and have a 30-pack-year history of smoking and currently smoke or have quit within the past 15 years. Yearly screening is stopped once you have quit smoking for at  least 15 years or develop a health problem that would prevent you from having lung cancer treatment.  Fecal occult blood test (FOBT) of stool. / Every year beginning at age 56 and continuing until age 36. You may not have to do this test if you get a colonoscopy every 10 years.  Flexible sigmoidoscopy** or colonoscopy.** / Every 5 years for a flexible sigmoidoscopy or every 10 years for a colonoscopy beginning at age 37 and continuing until age 54.  Hepatitis C blood test.** / For all people born from [birth year] through 1965 and any individual with known risks for hepatitis C.  Abdominal aortic aneurysm (AAA) screening.** / A one-time screening for ages 61 to 22 years who are current or former smokers.  Skin self-exam. / Monthly.  Influenza vaccine. / Every year.  Tetanus, diphtheria, and acellular pertussis (Tdap/Td) vaccine.** / 1 dose of Td every 10 years.  Varicella vaccine.** / Consult your health care provider.  Zoster vaccine.** / 1 dose for adults aged 1 years or older.  Pneumococcal 13-valent conjugate (PCV13) vaccine.** / 1 dose for all adults aged 54 years and older.  Pneumococcal polysaccharide (PPSV23) vaccine.** / 1 dose for all adults aged 34 years and older.  Meningococcal vaccine.** / Consult your health care provider.  Hepatitis A vaccine.** / Consult your health care provider.  Hepatitis B vaccine.** / Consult your health care provider.  Haemophilus influenzae type b (Hib) vaccine.** / Consult your health care provider. **Family history and personal history of risk and conditions may change your health care provider's recommendations.   This information is not intended to replace advice given to you by your health care provider. Make sure you discuss any questions you  have with your health care provider.   Document Released: 09/23/2001 Document Revised: 08/18/2014 Document Reviewed: 12/23/2010 [employer]vier Interactive Patient Education [employer].
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
## 2016-12-23 ENCOUNTER — Ambulatory Visit (INDEPENDENT_AMBULATORY_CARE_PROVIDER_SITE_OTHER): Payer: Medicare Other | Admitting: Family Medicine

## 2016-12-23 ENCOUNTER — Encounter: Payer: Self-pay | Admitting: Family Medicine

## 2016-12-23 VITALS — BP 128/84 | HR 80 | Ht 73.5 in | Wt 236.7 lb

## 2016-12-23 DIAGNOSIS — F411 Generalized anxiety disorder: Secondary | ICD-10-CM | POA: Diagnosis not present

## 2016-12-23 DIAGNOSIS — F5104 Psychophysiologic insomnia: Secondary | ICD-10-CM

## 2016-12-23 MED ORDER — ZOLPIDEM TARTRATE ER 12.5 MG PO TBCR: 12.5000 mg | EXTENDED_RELEASE_TABLET | Freq: Every evening | ORAL | 1 refills | Status: DC | PRN

## 2016-12-23 MED ORDER — CITALOPRAM HYDROBROMIDE 20 MG PO TABS: ORAL_TABLET | ORAL | 1 refills | Status: DC

## 2016-12-23 NOTE — Patient Instructions (Signed)
For the zolpidem--> if you can take 1/2 tab and sleep well- then only take half nightly.    Please don't go off these meds until we discuss it/ fup OV with me.       Generalized Anxiety Disorder, Adult Generalized anxiety disorder (GAD) is a mental health disorder. People with this condition constantly worry about everyday events. Unlike normal anxiety, worry related to GAD is not triggered by a specific event. These worries also do not fade or get better with time. GAD interferes with life functions, including relationships, work, and school. GAD can vary from mild to severe. People with severe GAD can have intense waves of anxiety with physical symptoms (panic attacks). What are the causes? The exact cause of GAD is not known. What increases the risk? This condition is more likely to develop in:  Women.  People who have a family history of anxiety disorders.  People who are very shy.  People who experience very stressful life events, such as the death of a loved one.  People who have a very stressful family environment. What are the signs or symptoms? People with GAD often worry excessively about many things in their lives, such as their health and family. They may also be overly concerned about:  Doing well at work.  Being on time.  Natural disasters.  Friendships. Physical symptoms of GAD include:  Fatigue.  Muscle tension or having muscle twitches.  Trembling or feeling shaky.  Being easily startled.  Feeling like your heart is pounding or racing.  Feeling out of breath or like you cannot take a deep breath.  Having trouble falling asleep or staying asleep.  Sweating.  Nausea, diarrhea, or irritable bowel syndrome (IBS).  Headaches.  Trouble concentrating or remembering facts.  Restlessness.  Irritability. How is this diagnosed? Your health care provider can diagnose GAD based on your symptoms and medical history. You will also have a physical exam.  The health care provider will ask specific questions about your symptoms, including how severe they are, when they started, and if they come and go. Your health care provider may ask you about your use of alcohol or drugs, including prescription medicines. Your health care provider may refer you to a mental health specialist for further evaluation. Your health care provider will do a thorough examination and may perform additional tests to rule out other possible causes of your symptoms. To be diagnosed with GAD, a person must have anxiety that:  Is out of his or her control.  Affects several different aspects of his or her life, such as work and relationships.  Causes distress that makes him or her unable to take part in normal activities.  Includes at least three physical symptoms of GAD, such as restlessness, fatigue, trouble concentrating, irritability, muscle tension, or sleep problems. Before your health care provider can confirm a diagnosis of GAD, these symptoms must be present more days than they are not, and they must last for six months or longer. How is this treated? The following therapies are usually used to treat GAD:  Medicine. Antidepressant medicine is usually prescribed for long-term daily control. Antianxiety medicines may be added in severe cases, especially when panic attacks occur.  Talk therapy (psychotherapy). Certain types of talk therapy can be helpful in treating GAD by providing support, education, and guidance. Options include:  Cognitive behavioral therapy (CBT). People learn coping skills and techniques to ease their anxiety. They learn to identify unrealistic or negative thoughts and behaviors and to  replace them with positive ones.  Acceptance and commitment therapy (ACT). This treatment teaches people how to be mindful as a way to cope with unwanted thoughts and feelings.  Biofeedback. This process trains you to manage your body's response (physiological  response) through breathing techniques and relaxation methods. You will work with a therapist while machines are used to monitor your physical symptoms.  Stress management techniques. These include yoga, meditation, and exercise. A mental health specialist can help determine which treatment is best for you. Some people see improvement with one type of therapy. However, other people require a combination of therapies. Follow these instructions at home:  Take over-the-counter and prescription medicines only as told by your health care provider.  Try to maintain a normal routine.  Try to anticipate stressful situations and allow extra time to manage them.  Practice any stress management or self-calming techniques as taught by your health care provider.  Do not punish yourself for setbacks or for not making progress.  Try to recognize your accomplishments, even if they are small.  Keep all follow-up visits as told by your health care provider. This is important. Contact a health care provider if:  Your symptoms do not get better.  Your symptoms get worse.  You have signs of depression, such as:  A persistently sad, cranky, or irritable mood.  Loss of enjoyment in activities that used to bring you joy.  Change in weight or eating.  Changes in sleeping habits.  Avoiding friends or family members.  Loss of energy for normal tasks.  Feelings of guilt or worthlessness. Get help right away if:  You have serious thoughts about hurting yourself or others. If you ever feel like you may hurt yourself or others, or have thoughts about taking your own life, get help right away. You can go to your nearest emergency department or call:  Your local emergency services (911 in the [place]  A suicide crisis helpline, such as the [place] at 808-601-7493. This is open 24 hours a day. Summary  Generalized anxiety disorder (GAD) is a mental health disorder that  involves worry that is not triggered by a specific event.  People with GAD often worry excessively about many things in their lives, such as their health and family.  GAD may cause physical symptoms such as restlessness, trouble concentrating, sleep problems, frequent sweating, nausea, diarrhea, headaches, and trembling or muscle twitching.  A mental health specialist can help determine which treatment is best for you. Some people see improvement with one type of therapy. However, other people require a combination of therapies. This information is not intended to replace advice given to you by your health care provider. Make sure you discuss any questions you have with your health care provider. Document Released: 11/22/2012 Document Revised: 06/17/2016 Document Reviewed: 06/17/2016 Elsevier Interactive Patient Education  2017 [employer].

## 2016-12-23 NOTE — Progress Notes (Signed)
Impression and Recommendations:    1. GAD (generalized anxiety disorder)   2. Psychophysiological insomnia    - restart meds at lower dose- slowly titrate up.  The patient was counseled, risk factors were discussed, anticipatory guidance given.  Meds ordered this encounter  Medications  . citalopram (CELEXA) 20 MG tablet    Sig: Start with one half a tablet daily for 1 week then increase to a full tablet daily    Dispense:  90 tablet    Refill:  1  . zolpidem (AMBIEN CR) 12.5 MG CR tablet    Sig: Take 1 tablet (12.5 mg total) by mouth at bedtime as needed for sleep.    Dispense:  30 tablet    Refill:  1    Gross side effects, risk and benefits, and alternatives of medications and treatment plan in general discussed with patient.  Patient is aware that all medications have potential side effects and we are unable to predict every side effect or drug-drug interaction that may occur.   Patient will call with any questions prior to using medication if they have concerns.  Expresses verbal understanding and consents to current therapy and treatment regimen.  No barriers to understanding were identified.  Red flag symptoms and signs discussed in detail.  Patient expressed understanding regarding what to do in case of emergency\urgent symptoms  Please see AVS handed out to patient at the end of our visit for further patient instructions/ counseling done pertaining to today's office visit.   Return for 6-8 wks f/up OV with me - GAD&sleep - starting new meds.     Note: This document was prepared using Dragon voice recognition software and may include unintentional dictation errors.  [name] 7:08 PM --------------------------------------------------------------------------------------------------------------------------------------------------------------------------------------------------------------------------------------------    Subjective:    CC:  Chief  Complaint  Patient presents with  . Hypertension  . Anxiety    HPI: George [name] is a 66 y.o. male who presents to [place] at [facility] today for issues as discussed below.   H/o insomnia and GAD txed with ambien and celexa in recent past with good effect- tol well.   No S-E.   But went off them on his own because he felt so good.  Having GAD Sx during the daytime now.  Having excess worry about things.  Lives alone.   Trouble sleeping some as well.  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  Sleeping ok--> falls asleep well and wakes up every night 3-4 times other than going to the bathroom- which is 3 times as well.  Not concerned about urinary issues though.    Wt Readings from Last 3 Encounters:  12/23/16 236 lb 11.2 oz (107.4 kg)  08/25/16 226 lb 14.4 oz (102.9 kg)  06/26/16 218 lb (98.9 kg)   BP Readings from Last 3 Encounters:  12/23/16 128/84  08/25/16 136/81  06/26/16 (!) 144/89   Pulse Readings from Last 3 Encounters:  12/23/16 80  08/25/16 73  06/26/16 76   BMI Readings from Last 3 Encounters:  12/23/16 30.81 kg/m  08/25/16 29.53 kg/m  06/26/16 26.54 kg/m     Patient Care Team    Relationship Specialty Notifications Start End  [name], DO PCP - General Family Medicine  03/03/16   Lorretta Harp, MD Consulting Physician Cardiology  04/05/16   Estanislado Emms, MD Consulting Physician Nephrology  04/05/16  Evans Lance, MD Consulting Physician Clinical Cardiac Electrophysiology  04/05/16    Comment: his  EP  [facility]  Comer, Okey Regal, MD Consulting Physician Infectious Diseases  04/05/16    Comment: Hep C txmnt  Sharmon Revere Physician Assistant Cardiology  04/05/16    Comment: follows pt's fluid status---> L Ext edema  Dillingham, Loel Lofty, DO Attending Physician Plastic Surgery  04/05/16    Comment: Dr. Theodoro Kos   ( plastics and reconstuctive sx at [facility]) sees  her in [place] at [facility];  she is who has diagnosed him with Berger's disease     Patient Active Problem List   Diagnosis Date Noted  . Alcoholism /alcohol abuse- episodic binges 04/05/2016    Priority: High  . 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 Insomnia 04/05/2016    Priority: High  . Noncompliance with diet and medication regimen 04/05/2016    Priority: High  . Signficant h/o tobacco abuse (150pk/yr hx) 03/03/2016    Priority: High  . h/o Basal cell carcinoma of nose 03/03/2016    Priority: High  . GAD (generalized anxiety disorder) 03/03/2016    Priority: High  . Hepatitis C infection 11/14/2015    Priority: High  . COPD mixed type ([place]) 10/12/2015    Priority: High  . h/o Hypothyroidism 09/22/2015    Priority: High  . 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 Berger's disease: IgA Nephropathy  10/12/2015    Priority: Medium  . Venous insufficiency of both lower extremities 09/21/2015    Priority: Medium  . H/O CHF 08/20/2015    Priority: Medium  . h/o SVT (supraventricular tachycardia) s/p Ablation 05/22/2015    Priority: Medium  . Vitamin D deficiency 05/19/2016  . Liver fibrosis 03/24/2016  . AAA -   (f/up US 12/2018) 09/24/2015    Past Medical history, Surgical history, Family history, Social history, Allergies and Medications have been entered into the medical record, reviewed and changed as needed.    Current Meds  Medication Sig  . aspirin EC 81 MG EC tablet Take 1 tablet (81 mg total) by mouth daily.  . Aspirin-Salicylamide-Caffeine (BC HEADACHE POWDER PO) Take 1 Package by mouth daily as needed (Body Aches).    Allergies:  No Known Allergies   Review of Systems: General:   Denies fever, chills, unexplained weight loss.  Optho/Auditory:   Denies visual changes, blurred vision/LOV Respiratory:   Denies wheeze, DOE more than baseline levels.  Cardiovascular:   Denies chest  pain, palpitations, new onset peripheral edema  Gastrointestinal:   Denies nausea, vomiting, diarrhea, abd pain.  Genitourinary: Denies dysuria, freq/ urgency, flank pain or discharge from genitals.  Endocrine:     Denies hot or cold intolerance, polyuria, polydipsia. Musculoskeletal:   Denies unexplained myalgias, joint swelling, unexplained arthralgias, gait problems.  Skin:  Denies new onset rash, suspicious lesions Neurological:     Denies dizziness, unexplained weakness, numbness  Psychiatric/Behavioral:   Denies mood changes, suicidal or homicidal ideations, hallucinations    Objective:   Blood pressure 128/84, pulse 80, height 6' 1.5" (1.867 m), weight 236 lb 11.2 oz (107.4 kg). Body mass index is 30.81 kg/m. General:  Well Developed, well nourished, appropriate for stated age.  Neuro:  Alert and oriented,  extra-ocular muscles intact  HEENT:  Normocephalic, atraumatic, neck supple, no carotid bruits appreciated  Skin:  no gross rash, warm, pink. Cardiac:  RRR, S1  S2 Respiratory:  ECTA B/L and A/P, Not using accessory muscles, speaking in full sentences- unlabored. Vascular:  Ext warm, no cyanosis apprec.; cap RF less 2 sec. Psych:  No HI/SI, judgement and insight good, Euthymic mood. Full Affect.

## 2017-01-07 ENCOUNTER — Telehealth: Payer: Self-pay | Admitting: Family Medicine

## 2017-01-07 NOTE — Telephone Encounter (Signed)
Please advise.  T. Nelson, CMA 

## 2017-01-07 NOTE — Telephone Encounter (Signed)
I asked pt to let me know sooner than our planned f/up if he is having any problems/ concerns.   Please call him and see if he is taking both meds- ambien and the SSRI daily and see if he feels sx are coming from one vs the other.

## 2017-01-07 NOTE — Telephone Encounter (Signed)
Pt called states Dr. Jenetta Downer told him to call to say how med treatment is going-Pt complains not wrong well for him, lacks motivation & unable to think clearly ask for advice.-- Pls call pt at list hm #. --glh

## 2017-01-08 NOTE — Telephone Encounter (Signed)
That's interesting because the last time I started him on this same mood stabilizing med, he did very well with it- and even at a higher dose.   It was the xanax that made him feel funny- which I did not give him this time. Last time he told me that once his sleep was restored, his excess worry went away and all.   Please remind him of these items and see if he would like to try something else or not  OV note from last time:  "Insomnia/ anxiety/panic:   feeling much better- took the [place] for about one week- restored his sleep patterns so well that he could come off it.   Xanax was too strong and made him feel funny.  ONly took it twice.   But glad he has just in case. I told him he could take a half a tablet.  Stopped celexa once sleep restored- his excess worry and anxiety when away."

## 2017-01-08 NOTE — Telephone Encounter (Signed)
Patient is taking [place] and SSRI.  He says the Lorrin Mais is working great.  He thinks the SSRI scrambles his thoughts and he has no motivation.  He would like to try something else.

## 2017-01-09 ENCOUNTER — Telehealth: Payer: Self-pay | Admitting: Family Medicine

## 2017-01-09 MED ORDER — CITALOPRAM HYDROBROMIDE 20 MG PO TABS: ORAL_TABLET | ORAL | 1 refills | Status: DC

## 2017-01-09 NOTE — Telephone Encounter (Signed)
Left message for patient to call back regarding is medication concerns.

## 2017-01-09 NOTE — Telephone Encounter (Signed)
Patient is happy with the ambien.  It does help sleep.  He is still having trouble with worrying during his wake times.  He would like to take something for this.  Wants something for depression.  Patient is going to take the citalopram half of the 20mg  tablets.  He liked how it made him feel.  Per verbal with Dr Raliegh Scarlet she is ok with this.  Patient will follow up if any changes.

## 2017-01-09 NOTE — Telephone Encounter (Signed)
PT called states just received a voicemail from [place]-- frwd message to MA of patient's return call. --glh

## 2017-01-09 NOTE — Telephone Encounter (Signed)
Tried to return patients call.  Left message for patient to call back regarding his medication concerns and Dr Jefferson Fuel recommendations.

## 2017-01-09 NOTE — Telephone Encounter (Signed)
See other encounter.

## 2017-01-09 NOTE — Addendum Note (Signed)
Addended by: Amado Coe on: 01/09/2017 10:47 AM   Modules accepted: Orders

## 2017-02-24 ENCOUNTER — Ambulatory Visit (INDEPENDENT_AMBULATORY_CARE_PROVIDER_SITE_OTHER): Payer: Medicare Other | Admitting: Family Medicine

## 2017-02-24 ENCOUNTER — Encounter: Payer: Self-pay | Admitting: Family Medicine

## 2017-02-24 VITALS — BP 115/79 | HR 71 | Ht 73.5 in | Wt 232.0 lb

## 2017-02-24 DIAGNOSIS — IMO0001 Reserved for inherently not codable concepts without codable children: Secondary | ICD-10-CM

## 2017-02-24 DIAGNOSIS — F1020 Alcohol dependence, uncomplicated: Secondary | ICD-10-CM

## 2017-02-24 DIAGNOSIS — F5104 Psychophysiologic insomnia: Secondary | ICD-10-CM

## 2017-02-24 DIAGNOSIS — F411 Generalized anxiety disorder: Secondary | ICD-10-CM

## 2017-02-24 MED ORDER — CITALOPRAM HYDROBROMIDE 20 MG PO TABS: ORAL_TABLET | ORAL | 1 refills | Status: DC

## 2017-02-24 MED ORDER — ZOLPIDEM TARTRATE ER 12.5 MG PO TBCR: 12.5000 mg | EXTENDED_RELEASE_TABLET | Freq: Every evening | ORAL | 1 refills | Status: DC | PRN

## 2017-02-24 NOTE — Progress Notes (Signed)
Impression and Recommendations:    1. Psychophysiological insomnia   2. GAD (generalized anxiety disorder)   3. Alcoholism /alcohol abuse- episodic binges      Alcoholism /alcohol abuse- episodic binges No ETOH use at all many months   The patient was counseled, risk factors were discussed, anticipatory guidance given.  Modified Medications   Modified Medication Previous Medication   CITALOPRAM (CELEXA) 20 MG TABLET citalopram (CELEXA) 20 MG tablet      Take half tablet daily.    Take half tablet daily.   ZOLPIDEM (AMBIEN CR) 12.5 MG CR TABLET zolpidem (AMBIEN CR) 12.5 MG CR tablet      Take 1 tablet (12.5 mg total) by mouth at bedtime as needed for sleep.    Take 1 tablet (12.5 mg total) by mouth at bedtime as needed for sleep.    Orders Placed This Encounter  Procedures  . Comprehensive metabolic panel    Gross side effects, risk and benefits, and alternatives of medications and treatment plan in general discussed with patient.  Patient is aware that all medications have potential side effects and we are unable to predict every side effect or drug-drug interaction that may occur.   Patient will call with any questions prior to using medication if they have concerns.  Expresses verbal understanding and consents to current therapy and treatment regimen.  No barriers to understanding were identified.  Red flag symptoms and signs discussed in detail.  Patient expressed understanding regarding what to do in case of emergency\urgent symptoms  Please see AVS handed out to patient at the end of our visit for further patient instructions/ counseling done pertaining to today's office visit.   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  .     Note: This document was prepared using Dragon voice recognition software and may include unintentional dictation errors.  [name] 1:43  PM --------------------------------------------------------------------------------------------------------------------------------------------------------------------------------------------------------------------------------------------    Subjective:    CC:  Chief Complaint  Patient presents with  . Medication Management    ambien, celexa  . Depression    HPI: George [name] is a 66 y.o. male who presents to [place] at [facility] today for issues as discussed below.  Mood-  Slowly titrated up on dose celexa- tolerating well. Keeps him from having "wild thoughts and bad thouights".   Quiet's his brain and helps him sleep  Insomnia:  Using ambien nitely.  If he doesn't use it, tried it one nite w/o it.  Around [place]- could take it anymore and had to get a pill.   Helps him fall and stay asleep.  A lot a nites - not waking at all.    Vit D def:  Taking D3 everyday.    Riding stationary bike daily 60min.      Wt Readings from Last 3 Encounters:  02/24/17 232 lb (105.2 kg)  12/23/16 236 lb 11.2 oz (107.4 kg)  08/25/16 226 lb 14.4 oz (102.9 kg)   BP Readings from Last 3 Encounters:  02/24/17 115/79  12/23/16 128/84  08/25/16 136/81   Pulse Readings from Last 3 Encounters:  02/24/17 71  12/23/16 80  08/25/16 73   BMI Readings from Last 3 Encounters:  02/24/17 30.19 kg/m  12/23/16 30.81 kg/m  08/25/16 29.53 kg/m     Patient Care Team    Relationship Specialty Notifications Start End  [name], DO PCP - General Family Medicine  03/03/16   Lorretta Harp, MD Consulting  Physician Cardiology  04/05/16   Estanislado Emms, MD Consulting Physician Nephrology  04/05/16   Evans Lance, MD Consulting Physician Clinical Cardiac Electrophysiology  04/05/16    Comment: his  EP  [facility]  Comer, Okey Regal, MD Consulting Physician Infectious Diseases  04/05/16    Comment: Hep C txmnt  Sharmon Revere Physician Assistant Cardiology  04/05/16     Comment: follows pt's fluid status---> L Ext edema  Dillingham, Loel Lofty, DO Attending Physician Plastic Surgery  04/05/16    Comment: Dr. Theodoro Kos   ( plastics and reconstuctive sx at [facility]) sees her in [place] at [facility];  she is who has diagnosed him with Berger's disease     Patient Active Problem List   Diagnosis Date Noted  . Alcoholism /alcohol abuse- episodic binges 04/05/2016    Priority: High  . 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 Insomnia 04/05/2016    Priority: High  . Noncompliance with diet and medication regimen 04/05/2016    Priority: High  . Signficant h/o tobacco abuse (150pk/yr hx) 03/03/2016    Priority: High  . h/o Basal cell carcinoma of nose 03/03/2016    Priority: High  . GAD (generalized anxiety disorder) 03/03/2016    Priority: High  . Hepatitis C infection 11/14/2015    Priority: High  . COPD mixed type ([place]) 10/12/2015    Priority: High  . h/o Hypothyroidism 09/22/2015    Priority: High  . 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 Berger's disease: IgA Nephropathy  10/12/2015    Priority: Medium  . Venous insufficiency of both lower extremities 09/21/2015    Priority: Medium  . H/O CHF 08/20/2015    Priority: Medium  . h/o SVT (supraventricular tachycardia) s/p Ablation 05/22/2015    Priority: Medium  . Vitamin D deficiency 05/19/2016  . Liver fibrosis 03/24/2016  . AAA -   (f/up US 12/2018) 09/24/2015    Past Medical history, Surgical history, Family history, Social history, Allergies and Medications have been entered into the medical record, reviewed and changed as needed.    Current Meds  Medication Sig  . aspirin EC 81 MG EC tablet Take 1 tablet (81 mg total) by mouth daily.  . Aspirin-Salicylamide-Caffeine (BC HEADACHE POWDER PO) Take 1 Package by mouth daily as needed (Body Aches).  . citalopram (CELEXA) 20 MG tablet Take half tablet daily.  Marland Kitchen  zolpidem (AMBIEN CR) 12.5 MG CR tablet Take 1 tablet (12.5 mg total) by mouth at bedtime as needed for sleep.  . [DISCONTINUED] citalopram (CELEXA) 20 MG tablet Take half tablet daily.  . [DISCONTINUED] zolpidem (AMBIEN CR) 12.5 MG CR tablet Take 1 tablet (12.5 mg total) by mouth at bedtime as needed for sleep.    Allergies:  No Known Allergies   Review of Systems: General:   Denies fever, chills, unexplained weight loss.  Optho/Auditory:   Denies visual changes, blurred vision/LOV Respiratory:   Denies wheeze, DOE more than baseline levels.  Cardiovascular:   Denies chest pain, palpitations, new onset peripheral edema  Gastrointestinal:   Denies nausea, vomiting, diarrhea, abd pain.  Genitourinary: Denies dysuria, freq/ urgency, flank pain or discharge from genitals.  Endocrine:     Denies hot or cold intolerance, polyuria, polydipsia. Musculoskeletal:   Denies unexplained myalgias, joint swelling, unexplained arthralgias, gait problems.  Skin:  Denies new onset rash, suspicious lesions Neurological:     Denies dizziness,  unexplained weakness, numbness  Psychiatric/Behavioral:   Denies mood changes, suicidal or homicidal ideations, hallucinations    Objective:   Blood pressure 115/79, pulse 71, height 6' 1.5" (1.867 m), weight 232 lb (105.2 kg). Body mass index is 30.19 kg/m. General:  Well Developed, well nourished, appropriate for stated age.  Neuro:  Alert and oriented,  extra-ocular muscles intact  HEENT:  Normocephalic, atraumatic, neck supple, no carotid bruits appreciated  Skin:  no gross rash, warm, pink. Cardiac:  RRR, S1 S2 Respiratory:  ECTA B/L and A/P, Not using accessory muscles, speaking in full sentences- unlabored. Vascular:  Ext warm, no cyanosis apprec.; cap RF less 2 sec. Psych:  No HI/SI, judgement and insight good, Euthymic mood. Full Affect.

## 2017-02-24 NOTE — Assessment & Plan Note (Signed)
No ETOH use at all many months

## 2017-02-24 NOTE — Patient Instructions (Signed)
If you have insomnia or difficulty sleeping, this information is for you:  - Avoid caffeinated beverages after lunch,  no alcoholic beverages,  no eating within 2-3 hours of lying down,  avoid exposure to blue light before bed,  avoid daytime naps, and  needs to maintain a regular sleep schedule- go to sleep and wake up around the same time every night.   - Resolve concerns or worries before entering bedroom:  Discussed relaxation techniques with patient and to keep a journal to write down fears\ worries.  I suggested seeing a counselor for CBT.   - Recommend patient meditate or do deep breathing exercises to help relax.   Incorporate the use of white noise machines or listen to "sleep meditation music", or recordings of guided meditations for sleep from YouTube which are free, such as  "guided meditation for detachment from over thinking"  by Mayford Knife.       Zolpidem extended-release tablets What is this medicine? ZOLPIDEM (zole PI dem) is used to treat insomnia. This medicine helps you to fall asleep and sleep through the night. This medicine may be used for other purposes; ask your health care provider or pharmacist if you have questions. COMMON BRAND NAME(S): Ambien CR What should I tell my health care provider before I take this medicine? They need to know if you have any of these conditions: -depression -history of drug abuse or addiction -if you often drink alcohol -liver disease -lung or breathing disease -myasthenia gravis -sleep apnea -suicidal thoughts, plans, or attempt; a previous suicide attempt by you or a family member -an unusual or allergic reaction to zolpidem, other medicines, foods, dyes, or preservatives -pregnant or trying to get pregnant -breast-feeding How should I use this medicine? Take this medicine by mouth with a glass of water. Follow the directions on the prescription label. Do not crush, split, or chew the tablet before swallowing. It is better to  take this medicine on an empty stomach and only when you are ready for bed. Do not take your medicine more often than directed. If you have been taking this medicine for several weeks and suddenly stop taking it, you may get unpleasant withdrawal symptoms. Your doctor or health care professional may want to gradually reduce the dose. Do not stop taking this medicine on your own. Always follow your doctor or health care professional's advice. A special MedGuide will be given to you by the pharmacist with each prescription and refill. Be sure to read this information carefully each time. Talk to your pediatrician regarding the use of this medicine in children. Special care may be needed. Overdosage: If you think you have taken too much of this medicine contact a poison control center or emergency room at once. NOTE: This medicine is only for you. Do not share this medicine with others. What if I miss a dose? This does not apply. This medicine should only be taken immediately before going to sleep. Do not take double or extra doses. What may interact with this medicine? -alcohol -antihistamines for allergy, cough and cold -certain medicines for anxiety or sleep -certain medicines for depression, like amitriptyline, fluoxetine, sertraline -certain medicines for fungal infections like ketoconazole and itraconazole -certain medicines for seizures like phenobarbital, primidone -ciprofloxacin -dietary supplements for sleep, like valerian or kava kava -general anesthetics like halothane, isoflurane, methoxyflurane, propofol -local anesthetics like lidocaine, pramoxine, tetracaine -medicines that relax muscles for surgery -narcotic medicines for pain -phenothiazines like chlorpromazine, mesoridazine, prochlorperazine, thioridazine -rifampin This list may not  describe all possible interactions. Give your health care provider a list of all the medicines, herbs, non-prescription drugs, or dietary  supplements you use. Also tell them if you smoke, drink alcohol, or use illegal drugs. Some items may interact with your medicine. What should I watch for while using this medicine? Visit your doctor or health care professional for regular checks on your progress. Keep a regular sleep schedule by going to bed at about the same time each night. Avoid caffeine-containing drinks in the evening hours. When sleep medicines are used every night for more than a few weeks, they may stop working. Talk to your doctor if your insomnia worsens or is not better within 7 to 10 days. After taking this medicine for sleep, you may get up out of bed while not being fully awake and do an activity that you do not know you are doing. The next morning, you may have no memory of the event. Activities such as driving a car ("sleep-driving"), making and eating food, talking on the phone, sexual activity, and sleep-walking have been reported. Call your doctor right away if you find out you have done any of these activities. Do not take this medicine if you have used alcohol that evening or before bed or taken another medicine for sleep since your risk of doing these sleep-related activities will be increased. Do not take this medicine unless you are able to stay in bed for a full night (7 to 8 hours) before you must be active again. Do not drive, use machinery, or do anything that needs mental alertness the day after you take this medicine. You may have a decrease in mental alertness the day after use, even if you feel that you are fully awake. Tell your doctor if you will need to perform activities requiring full alertness, such as driving, the next day. Do not stand or sit up quickly, especially if you are an older patient. This reduces the risk of dizzy or fainting spells. If you or your family notice any changes in your moods or behavior, such as new or worsening depression, thoughts of harming yourself, anxiety, other unusual or  disturbing thoughts, or memory loss, call your doctor right away. After you stop taking this medicine, you may have trouble falling asleep. This is called rebound insomnia. This problem usually goes away on its own after 1 or 2 nights. What side effects may I notice from receiving this medicine? Side effects that you should report to your doctor or health care professional as soon as possible: -allergic reactions like skin rash, itching or hives, swelling of the face, lips, or tongue -breathing problems -changes in vision -confusion -depressed mood or other changes in moods or emotions -feeling faint or lightheaded, falls -hallucinations -loss of balance or coordination -loss of memory -numbness or tingling of the tongue -restlessness, excitability, or feelings of anxiety or agitation -signs and symptoms of liver injury like dark yellow or brown urine; general ill feeling or flu-like symptoms; light-colored stools; loss of appetite; nausea; right upper belly pain; unusually weak or tired; yellowing of the eyes or skin -suicidal thoughts -unusual activities while asleep like driving, eating, making phone calls, or sexual activity Side effects that usually do not require medical attention (report to your doctor or health care professional if they continue or are bothersome): -dizziness -drowsiness the day after you take this medicine -headache This list may not describe all possible side effects. Call your doctor for medical advice about side effects. You  may report side effects to FDA at 1-800-FDA-1088. Where should I keep my medicine? Keep out of the reach of children. This medicine can be abused. Keep your medicine in a safe place to protect it from theft. Do not share this medicine with anyone. Selling or giving away this medicine is dangerous and against the law. This medicine may cause accidental overdose and death if taken by other adults, children, or pets. Mix any unused medicine with  a substance like cat litter or coffee grounds. Then throw the medicine away in a sealed container like a sealed bag or a coffee can with a lid. Do not use the medicine after the expiration date. Store at controlled room temperature between 15 and 25 degrees C (59 and 77 degrees F). NOTE: This sheet is a summary. It may not cover all possible information. If you have questions about this medicine, talk to your doctor, pharmacist, or health care provider.  2018 Elsevier/Gold Standard (2015-10-31 14:31:21)

## 2017-02-25 LAB — COMPREHENSIVE METABOLIC PANEL WITH GFR
ALT: 15 IU/L (ref 0–44)
AST: 19 IU/L (ref 0–40)
Albumin/Globulin Ratio: 2.1 (ref 1.2–2.2)
Albumin: 4.7 g/dL (ref 3.6–4.8)
Alkaline Phosphatase: 52 IU/L (ref 39–117)
BUN/Creatinine Ratio: 11 (ref 10–24)
BUN: 11 mg/dL (ref 8–27)
Bilirubin Total: 0.8 mg/dL (ref 0.0–1.2)
CO2: 22 mmol/L (ref 20–29)
Calcium: 9.6 mg/dL (ref 8.6–10.2)
Chloride: 105 mmol/L (ref 96–106)
Creatinine, Ser: 1.03 mg/dL (ref 0.76–1.27)
GFR calc Af Amer: 86 mL/min/1.73
GFR calc non Af Amer: 75 mL/min/1.73
Globulin, Total: 2.2 g/dL (ref 1.5–4.5)
Glucose: 74 mg/dL (ref 65–99)
Potassium: 5.6 mmol/L — ABNORMAL HIGH (ref 3.5–5.2)
Sodium: 145 mmol/L — ABNORMAL HIGH (ref 134–144)
Total Protein: 6.9 g/dL (ref 6.0–8.5)

## 2017-04-29 ENCOUNTER — Ambulatory Visit (INDEPENDENT_AMBULATORY_CARE_PROVIDER_SITE_OTHER): Payer: Medicare Other | Admitting: Family Medicine

## 2017-04-29 ENCOUNTER — Encounter: Payer: Self-pay | Admitting: Family Medicine

## 2017-04-29 VITALS — BP 160/90 | HR 62 | Ht 73.5 in | Wt 237.2 lb

## 2017-04-29 DIAGNOSIS — Z1389 Encounter for screening for other disorder: Secondary | ICD-10-CM | POA: Diagnosis not present

## 2017-04-29 DIAGNOSIS — B182 Chronic viral hepatitis C: Secondary | ICD-10-CM

## 2017-04-29 DIAGNOSIS — Z8639 Personal history of other endocrine, nutritional and metabolic disease: Secondary | ICD-10-CM | POA: Insufficient documentation

## 2017-04-29 DIAGNOSIS — R030 Elevated blood-pressure reading, without diagnosis of hypertension: Secondary | ICD-10-CM

## 2017-04-29 DIAGNOSIS — Z23 Encounter for immunization: Secondary | ICD-10-CM | POA: Diagnosis not present

## 2017-04-29 DIAGNOSIS — Z0000 Encounter for general adult medical examination without abnormal findings: Secondary | ICD-10-CM

## 2017-04-29 DIAGNOSIS — F5104 Psychophysiologic insomnia: Secondary | ICD-10-CM

## 2017-04-29 DIAGNOSIS — F411 Generalized anxiety disorder: Secondary | ICD-10-CM | POA: Diagnosis not present

## 2017-04-29 DIAGNOSIS — B1920 Unspecified viral hepatitis C without hepatic coma: Secondary | ICD-10-CM

## 2017-04-29 DIAGNOSIS — E66811 Obesity, class 1: Secondary | ICD-10-CM

## 2017-04-29 DIAGNOSIS — E559 Vitamin D deficiency, unspecified: Secondary | ICD-10-CM | POA: Diagnosis not present

## 2017-04-29 DIAGNOSIS — J449 Chronic obstructive pulmonary disease, unspecified: Secondary | ICD-10-CM

## 2017-04-29 DIAGNOSIS — C44310 Basal cell carcinoma of skin of unspecified parts of face: Secondary | ICD-10-CM

## 2017-04-29 DIAGNOSIS — Z5320 Procedure and treatment not carried out because of patient's decision for unspecified reasons: Secondary | ICD-10-CM | POA: Diagnosis not present

## 2017-04-29 DIAGNOSIS — Z87891 Personal history of nicotine dependence: Secondary | ICD-10-CM

## 2017-04-29 DIAGNOSIS — R6889 Other general symptoms and signs: Secondary | ICD-10-CM | POA: Diagnosis not present

## 2017-04-29 DIAGNOSIS — E038 Other specified hypothyroidism: Secondary | ICD-10-CM

## 2017-04-29 DIAGNOSIS — E039 Hypothyroidism, unspecified: Secondary | ICD-10-CM | POA: Diagnosis not present

## 2017-04-29 DIAGNOSIS — E669 Obesity, unspecified: Secondary | ICD-10-CM

## 2017-04-29 LAB — POCT UA - MICROALBUMIN
Albumin/Creatinine Ratio, Urine, POC: >300
CREATININE, POC: 100 mg/dL
Microalbumin Ur, POC: 150 mg/L

## 2017-04-29 MED ORDER — CITALOPRAM HYDROBROMIDE 20 MG PO TABS: ORAL_TABLET | ORAL | 1 refills | Status: DC

## 2017-04-29 NOTE — Progress Notes (Signed)
Male physical  Impression and Recommendations:    1. Need for pneumococcal vaccination   2. Encounter for wellness examination   3. Screening for multiple conditions   4. Hepatitis C virus infection without hepatic coma, unspecified chronicity   5. Chronic hepatitis C without hepatic coma (HCC) Chronic  6. 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 Signficant h/o tobacco abuse (150pk/yr hx)   8. Obesity, Class I, BMI 30-34.9              Orders Placed This Encounter  Procedures  . Pneumococcal conjugate vaccine 13-valent IM  . Hepatitis A hepatitis B combined vaccine IM  . HCV RNA quant  . Comprehensive metabolic panel    Order Specific Question:   Has the patient fasted?    Answer:   Yes  . Lipid panel    Order Specific Question:   Has the patient fasted?    Answer:   Yes  . T4, free  . TSH  . Vitamin B12  . VITAMIN D 25 Hydroxy (Vit-D Deficiency, Fractures)  . CBC with Differential/Platelet  . Hemoglobin A1c  . POCT UA - Microalbumin     Patient's Medications  New Prescriptions   No medications on file  Previous Medications   ASPIRIN EC 81 MG EC TABLET    Take 1 tablet (81 mg total) by mouth daily.   ASPIRIN-SALICYLAMIDE-CAFFEINE (BC HEADACHE POWDER PO)    Take 1 Package by mouth daily as needed (Body Aches).   CHOLECALCIFEROL (VITAMIN D3) 5000 UNITS CAPS    Take 1 capsule by mouth daily.   ZOLPIDEM (AMBIEN CR) 12.5 MG CR TABLET    Take 1 tablet (12.5 mg total) by mouth at bedtime as needed for sleep.  Modified Medications   Modified Medication Previous Medication   CITALOPRAM (CELEXA) 20 MG TABLET citalopram (CELEXA) 20 MG tablet      Take 1 tablet daily.    Take half tablet daily.  Discontinued Medications   No medications on file     Please see AVS handed out to patient at the end of our visit for further patient instructions/ counseling done pertaining to today's office visit.  1) Anticipatory Guidance: Discussed importance of wearing a seatbelt while  driving, not texting while driving;   sunscreen when outside along with skin surveillance; eating a balanced and modest diet; physical activity at least 25 minutes per day or 150 min/ week moderate to intense activity.  2) Immunizations / Screenings / Labs:  All immunizations are up-to-date per recommendations or will be updated today. Patient is due for dental and vision screens which pt will schedule independently. Will obtain CBC, CMP, HgA1c, Lipid panel, TSH and vit D when fasting, if not already done recently.   3) Weight:  BMI meaning discussed with patient.  Discussed goal of losing 5-10% of current body weight which would improve overall feelings of well being and improve objective health data. Improve nutrient density of diet through increasing intake of fruits and vegetables and decreasing saturated fats, white flour products and refined sugars.    Gross side effects, risk and benefits, and alternatives of medications discussed with patient.  Patient is aware that all medications have potential side effects and we are unable to predict every side effect or drug-drug interaction that may occur.  Expresses verbal understanding and consents to current therapy plan and treatment regimen.  Follow-up preventative CPE in 1 year. Follow-up office visit pending lab work.  F/up sooner for chronic care  management and/or prn   9. GAD (generalized anxiety disorder)   10. Psychophysiological insomnia   11. Elevated blood pressure reading- situational     h/o Hepatitis C infection We will obtain HCV RNA viral load today as per instructed through his infectious disease doctor Dr Linus Salmons;  who also said in November 2017 the patient should have hepatitis A and B immunization series as well.  - Hep A and B immunizations started today.  - Told pt in person and on AVS importance of exact dosing of these in future    h/o Basal cell carcinoma of nose Importance of yearly skin examinations with his  Dermatologist discussed with patient. -  GAD: We'll give new prescription for the Celexa at 20 mg per day or 1 full tablet daily, which is what patient has been taking. -  Insomnia: Continue Ambien.  Patient aware of risks and benefits of these medications.  He will attempt to wean himself off it. -  Elevated blood pressure: Patient was told to check his blood pressure at home and follow up sooner than planned if remains as it is today.  He knows goal is less than 140/90 on a regular basis.  We discussed his recent increase in weight may have contributed and I recommend weight loss, prudent diet, increasing physical activity, decreasing salt intake.  He will work on trying to drink more water.     Subjective:    CC: CPE  HPI: George [name] is a 66 y.o. male who presents to [place] at [facility] today for a yearly health maintenance exam.     Health Maintenance Summary Reviewed and updated, unless pt declines services. Mood/ anxiety--> doing well on one full tab.   Sleep-  Using ambien nightly-   Tried 1/2 tab and didn't work well.  Can't sleep w/o it.    BP- checking at home---> every day per the Berger's dx doc suggested to check it.  Never been on blood pressure medicines in the past.  Usually  Runs 130/80 on reg basis at home.  Today- not sure why it is up.   No sx at all.   Aspirin: administering 81 mg daily Colonoscopy:    Declines colonoscopy based on his beliefs that it is not necessary, also declines cologuard Tdap: Up to date: needs TD  Flu vaccine- patient declines this. Pneumovax/PPSV23:  Up to date: see Immunizations. Prevnar 13/PCV13:    To be administered at this encounter. Zostavax:    Postponed. Tobacco History Reviewed:   Yes--->  CT scan for screening lung CA:   CT angiographically chest with contrast done 1\11\17 to rule out PE.  No acute abnormality appreciated.  Patient has not had low contrast CT ---> pt refuses. Abdominal Ultrasound:     Ultrasound completed 5\10\17.  Abdominal aorta measures up to 3.1 cm  Alcohol:    No concerns, no use- last use was 1 yr ago Exercise Habits:   Rides stationary bike 60min every morning. STD concerns:   none Drug Use:   None Birth control method:   n/a Testicular/penile concerns:     no  Assessment & Plan Note by Thayer Headings, MD at 06/27/2016 2:00 PM   Author: Thayer Headings, MD Author Type: Physician Filed: 06/27/2016 2:01 PM  Note Status: Written Cosign: Cosign Not Required Encounter Date: 06/26/2016  Problem: Hepatitis C infection  Editor: Thayer Headings, MD (Physician)    SVR 12 today to confirm cure.  Some  reports of failure after SVR12 so I will ask his PCP to recheck his HCV RNA viral load in 6-12 months one last time. He also will need hepatitis A and B series which I did not start.    He will follow up as needed.     Assessment & Plan Note by Thayer Headings, MD at 06/27/2016 1:59 PM   Author: Thayer Headings, MD Author Type: Physician Filed: 06/27/2016 2:00 PM  Note Status: Written Cosign: Cosign Not Required Encounter Date: 06/26/2016  Problem: Liver fibrosis [facility])  Editor: Thayer Headings, MD (Physician)    Moderate fibrosis and no cirrhosis on ultrasound.  No indication for [place] screening.          Health Maintenance  Topic Date Due  . INFLUENZA VACCINE  05/11/2017 (Originally 03/11/2017)  . PNA vac Low Risk Adult (2 of 2 - PCV13) 06/11/2017 (Originally 08/22/2016)  . COLONOSCOPY  08/11/2017 (Originally 05/10/1999)  . TETANUS/TDAP  01/30/2025  . Hepatitis C Screening  Completed      Wt Readings from Last 3 Encounters:  04/29/17 237 lb 3.2 oz (107.6 kg)  02/24/17 232 lb (105.2 kg)  12/23/16 236 lb 11.2 oz (107.4 kg)   BP Readings from Last 3 Encounters:  04/29/17 (!) 160/90  02/24/17 115/79  12/23/16 128/84   Pulse Readings from Last 3 Encounters:  04/29/17 62  02/24/17 71  12/23/16 80    Patient Active Problem List   Diagnosis Date Noted    . 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 Alcoholism /alcohol abuse- episodic binges 04/05/2016    Priority: High  . 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 Insomnia 04/05/2016    Priority: High  . Noncompliance with diet and medication regimen 04/05/2016    Priority: High  . Signficant h/o tobacco abuse (150pk/yr hx) 03/03/2016    Priority: High  . h/o Basal cell carcinoma of nose 03/03/2016    Priority: High  . GAD (generalized anxiety disorder) 03/03/2016    Priority: High  . h/o Hepatitis C infection 11/14/2015    Priority: High  . COPD mixed type ([place]) 10/12/2015    Priority: High  . AAA -   (f/up US 12/2018) 09/24/2015    Priority: High  . h/o Hypothyroidism 09/22/2015    Priority: High  . Berger's disease: IgA Nephropathy  10/12/2015    Priority: Medium  . Venous insufficiency of both lower extremities 09/21/2015    Priority: Medium  . H/O CHF 08/20/2015    Priority: Medium  . h/o SVT (supraventricular tachycardia) s/p Ablation 05/22/2015    Priority: Medium  . Obesity, Class I, BMI 30-34.9 04/29/2017    Priority: Low  . Elevated blood pressure reading- situational 04/29/2017    Priority: Low  . History of elevated glucose 04/29/2017    Priority: Low  . Colonoscopy refused 04/29/2017    Priority: Low  . Vitamin D deficiency 05/19/2016    Priority: Low  . Liver fibrosis 03/24/2016    Priority: Low    Past Medical History:  Diagnosis Date  . AAA (abdominal aortic aneurysm) ([place])    a. [place] 09/2015 - 3cm distal AAA. //  b. [place] 5/17: 3.1 cm   . Basal cell carcinoma of right side of nose   . Berger's disease   . CKD (chronic kidney disease) 09/2015  . GERD (gastroesophageal reflux disease)   . Nephrotic syndrome   .  Panic attacks   . SVT (supraventricular tachycardia) ([place]) 05/22/2015    Past Surgical History:  Procedure Laterality Date  . BASAL CELL CARCINOMA EXCISION Right ~ 06/2015    "side of my nose"  . ELECTROPHYSIOLOGIC STUDY N/A 09/10/2015   Procedure: SVT Ablation;  Surgeon: Evans Lance, MD;  Location: [place] [place] [facility];  Service: Cardiovascular;  Laterality: N/A;  . FEMUR FRACTURE SURGERY Right 1970   "had plate & pin put in"  . FEMUR HARDWARE REMOVAL Right 1971   "removed plate; left pin in  . FRACTURE SURGERY    . KNEE ARTHROSCOPY Right ~ 1985  . TONSILLECTOMY AND ADENOIDECTOMY  ~ 1955    Family History  Problem Relation Age of Onset  . Heart disease Mother        Before age 64  . Heart attack Mother   . Cancer Mother        lung and colon  . Stroke Mother   . Heart failure Father   . Heart disease Father        After age 98  . Heart attack Father   . Hyperlipidemia Father   . Healthy Maternal Grandmother   . Healthy Maternal Grandfather   . Diabetes Paternal Grandmother   . Healthy Paternal Grandfather   . Hypertension Neg Hx     History  Drug Use No  ,  History  Alcohol Use  . 1.8 - 2.4 oz/week  . 3 - 4 Shots of liquor per week  ,  History  Smoking Status  . Former Smoker  . Packs/day: 2.00  . Years: 50.00  . Types: Cigarettes  . Quit date: 01/24/2015  Smokeless Tobacco  . Never Used  ,  History  Sexual Activity  . Sexual activity: Not Currently    Patient's Medications  New Prescriptions   No medications on file  Previous Medications   ASPIRIN EC 81 MG EC TABLET    Take 1 tablet (81 mg total) by mouth daily.   ASPIRIN-SALICYLAMIDE-CAFFEINE (BC HEADACHE POWDER PO)    Take 1 Package by mouth daily as needed (Body Aches).   CHOLECALCIFEROL (VITAMIN D3) 5000 UNITS CAPS    Take 1 capsule by mouth daily.   ZOLPIDEM (AMBIEN CR) 12.5 MG CR TABLET    Take 1 tablet (12.5 mg total) by mouth at bedtime as needed for sleep.  Modified Medications   Modified Medication Previous Medication   CITALOPRAM (CELEXA) 20 MG TABLET citalopram (CELEXA) 20 MG tablet      Take 1 tablet daily.    Take half tablet daily.  Discontinued  Medications   No medications on file    Patient has no known allergies.  Review of Systems: General:   Denies fever, chills, unexplained weight loss.  Optho/Auditory:   Denies visual changes, blurred vision/LOV Respiratory:   Denies SOB, DOE more than baseline levels.  Cardiovascular:   Denies chest pain, palpitations, new onset peripheral edema  Gastrointestinal:   Denies nausea, vomiting, diarrhea.  Genitourinary: Denies dysuria, freq/ urgency, flank pain or discharge from genitals.  Endocrine:     Denies hot or cold intolerance, polyuria, polydipsia. Musculoskeletal:   Denies unexplained myalgias, joint swelling, unexplained arthralgias, gait problems.  Skin:  Denies rash, suspicious lesions Neurological:     Denies dizziness, unexplained weakness, numbness  Psychiatric/Behavioral:   Denies mood changes, suicidal or homicidal ideations, hallucinations    Objective:     Blood pressure (!) 160/90, pulse 62, height 6' 1.5" (1.867 m), weight  237 lb 3.2 oz (107.6 kg). Body mass index is 30.87 kg/m. General Appearance:    Alert, cooperative, no distress, appears stated age  Head:    Normocephalic, without obvious abnormality, atraumatic  Eyes:    PERRL, conjunctiva/corneas clear, EOM's intact, fundi    benign, both eyes  Ears:    Normal TM's and external ear canals, both ears  Nose:   Nares normal, septum midline, mucosa normal, no drainage    or sinus tenderness  Throat:   Lips w/o lesion, mucosa moist, and tongue normal; teeth and   gums normal  Neck:   Supple, symmetrical, trachea midline, no adenopathy;    thyroid:  no enlargement/tenderness/nodules; no carotid   bruit or JVD  Back:     Symmetric, no curvature, ROM normal, no CVA tenderness  Lungs:     Clear to auscultation bilaterally, respirations unlabored, no       Wh/ R/ R  Chest Wall:    No tenderness or gross deformity; normal excursion   Heart:    Regular rate and rhythm, S1 and S2 normal, no murmur, rub   or  gallop  Abdomen:     Soft, non-tender, bowel sounds active all four quadrants, NO   G/R/R, no masses, no organomegaly  Genitalia:    Ext genitalia: without lesion, no penile rash or discharge, no hernias appreciated   Rectal:    Normal tone, prostate WNL's and equal b/l, no tenderness; guaiac negative stool  Extremities:   Extremities normal, atraumatic, no cyanosis or gross edema  Pulses:   2+ and symmetric all extremities  Skin:   Warm, dry, Skin color, texture, turgor normal, no obvious rashes or lesions  M-Sk:   Ambulates * 4 w/o difficulty, no gross deformities, tone WNL  Neurologic:   CNII-XII intact, normal strength, sensation and reflexes    Throughout Psych:  No HI/SI, judgement and insight good, Euthymic mood. Full Affect.

## 2017-04-29 NOTE — Patient Instructions (Addendum)
Please give patient Hemoccult cards for home use 3.  Please instruct him on usage of it  Obtain fasting lab work today  We will give you the combine A-hep B vaccine today, in one month and then in 6 months--> please be very specific on the dates.  This is very important.  Please make these appointments for nurse only visits just for the vaccines.  ----------------------------------------------------------------------------------------------    Preventive Care for Adults  A healthy lifestyle and preventive care can promote health and wellness. Preventive health guidelines for men include the following key practices: .   A routine yearly physical is a good way to check with your health care provider about your health and preventative screening. It is a chance to share any concerns and updates on your health and to receive a thorough exam. .  Visit your dentist for a routine exam and preventative care every 6 months. Brush your teeth twice a day and floss once a day. Good oral hygiene prevents tooth decay and gum disease. .  The frequency of eye exams is based on your age, health, family medical history, use of contact lenses, and other factors.  Follow your health care provider's recommendations for frequency of eye exams. .  Eat a healthy diet.  Foods such as vegetables, fruits, whole grains, low-fat dairy products, and lean protein foods contain the nutrients you need without too many calories.  Decrease your intake of foods high in solid fats, added sugars, and salt.  Eat the right amount of calories for you.  Get information about a proper diet from your health care provider, if necessary. .  Regular physical exercise is one of the most important things you can do for your health.  Most adults should get at least 150 minutes of moderate-intensity exercise (any activity that increases your heart rate and causes you to sweat) each week.  In addition, most adults need muscle-strengthening  exercises on 2 or more days a week. .  Maintain a healthy weight. The body mass index (BMI) is a screening tool to identify possible weight problems. It provides an estimate of body fat based on height and weight. Your health care provider can find your BMI and can help you achieve or maintain a healthy weight. For adults 20 years and older: A BMI below 18.5 is considered underweight. A BMI of 18.5 to 24.9 is normal. A BMI of 25 to 29.9 is considered overweight. A BMI of 30 and above is considered obese. .  Maintain normal blood lipids and cholesterol levels by exercising and minimizing your intake of saturated fat. Eat a balanced diet with plenty of fruit and vegetables. Blood tests for lipids and cholesterol should begin at age 35 and be repeated every 5 years. If your lipid or cholesterol levels are high, you are over 50, or you are at high risk for heart disease, you may need your cholesterol levels checked more frequently. Ongoing high lipid and cholesterol levels should be treated with medicines if diet and exercise are not working. .  If you smoke, find out from your health care provider how to quit. If you do not use tobacco, do not start. . If you choose to drink alcohol, do not have more than 2 drinks per day. One drink is considered to be 12 ounces (355 mL) of beer, 5 ounces (148 mL) of wine, or 1.5 ounces (44 mL) of liquor. Marland Kitchen Avoid use of street drugs. Do not share needles with anyone. Ask for help  if you need support or instructions about stopping the use of drugs. . High blood pressure causes heart disease and increases the risk of stroke. Your blood pressure should be checked at least every 1-2 years. Ongoing high blood pressure should be treated with medicines, if weight loss and exercise are not effective. . If you are 23-44 years old, ask your health care provider if you should take aspirin to prevent heart disease. . Diabetes screening involves taking a blood sample to check  your fasting blood sugar level.  This should be done once every 3 years, after age 86, if you are within normal weight and without risk factors for diabetes.  Testing should be considered at a younger age or be carried out more frequently if you are overweight and have at least 1 risk factor for diabetes. . Colorectal cancer can be detected and often prevented. Most routine colorectal cancer screening begins at the age of 87 and continues through age 16. However, your health care provider may recommend screening at an earlier age if you have risk factors for colon cancer. On a yearly basis, your health care provider may provide home test kits to check for hidden blood in the stool. Use of a small camera at the end of a tube to directly examine the colon (sigmoidoscopy or colonoscopy) can detect the earliest forms of colorectal cancer. Talk to your health care provider about this at age 14, when routine screening begins. Direct exam of the colon should be repeated every 5-10 years through age 54, unless early forms of precancerous polyps or small growths are found. .  Lung cancer screening is recommended for adults aged 24-80 years who are at high risk for developing lung cancer because of a history of smoking. A yearly low-dose CT scan of the lungs is recommended for people who have at least a 30-pack-year history of smoking and are a current smoker or have quit within the past 15 years. A pack year of smoking is smoking an average of 1 pack of cigarettes a day for 1 year (for example: 1 pack a day for 30 years or 2 packs a day for 15 years). Yearly screening should continue until the smoker has stopped smoking for at least 15 years. Yearly screening should be stopped for people who develop a health problem that would prevent them from having lung cancer treatment. . Talk with your health care provider about prostate cancer screening. . Testicular cancer screening is recommended for adult males. Screening  includes self-exam and a health care provider exam. Consult with your health care provider about any symptoms you have or any concerns you have about testicular cancer. . Use sunscreen. Apply sunscreen liberally and repeatedly throughout the day. You should seek shade when your shadow is shorter than you. Protect yourself by wearing long sleeves, pants, a wide-brimmed hat, and sunglasses year round, whenever you are outdoors.  Once a month, do a whole-body skin exam, using a mirror to look at the skin on your back. Tell your health care provider about new moles, moles that have irregular borders, moles that are larger than a pencil eraser, or moles that have changed in shape or color.  ++++++++++++++++++++++++++++++++++++++++++++++++++++++++++++++++++  Stay current with required vaccines (immunizations). ? Influenza vaccine. All adults should be immunized every year. ? Tetanus, diphtheria, and acellular pertussis (Td, Tdap) vaccine. An adult who has not previously received Tdap or who does not know his vaccine status should receive 1 dose of Tdap. This initial dose  should be followed by tetanus and diphtheria toxoids (Td) booster doses every 10 years. Adults with an unknown or incomplete history of completing a 3-dose immunization series with Td-containing vaccines should begin or complete a primary immunization series including a Tdap dose. Adults should receive a Td booster every 10 years. ? Varicella vaccine. An adult without evidence of immunity to varicella should receive 2 doses or a second dose if he has previously received 1 dose. ? Human papillomavirus (HPV) vaccine. Males aged 27-21 years who have not received the vaccine previously should receive the 3-dose series. Males aged 22-26 years may be immunized. Immunization is recommended through the age of 18 years for any male who has sex with males and did not get any or all doses earlier. Immunization is recommended for any person with an  immunocompromised condition through the age of 55 years if he did not get any or all doses earlier. During the 3-dose series, the second dose should be obtained 4-8 weeks after the first dose. The third dose should be obtained 24 weeks after the first dose and 16 weeks after the second dose. ? Zoster vaccine. One dose is recommended for adults aged 10 years or older unless certain conditions are present. ? PREVNAR - Pneumococcal 13-valent conjugate (PCV13) vaccine. When indicated, a person who is uncertain of his immunization history and has no record of immunization should receive the PCV13 vaccine. An adult aged 54 years or older who has certain medical conditions and has not been previously immunized should receive 1 dose of PCV13 vaccine. This PCV13 should be followed with a dose of pneumococcal polysaccharide (PPSV23) vaccine. The PPSV23 vaccine dose should be obtained at least 8 weeks after the dose of PCV13 vaccine. An adult aged 34 years or older who has certain medical conditions and previously received 1 or more doses of PPSV23 vaccine should receive 1 dose of PCV13. The PCV13 vaccine dose should be obtained 1 or more years after the last PPSV23 vaccine dose.  PNEUMOVAX - Pneumococcal polysaccharide (PPSV23) vaccine. When PCV13 is also indicated, PCV13 should be obtained first. All adults aged 56 years and older should be immunized. An adult younger than age 32 years who has certain medical conditions should be immunized. Any person who resides in a nursing home or long-term care facility should be immunized. An adult smoker should be immunized. People with an immunocompromised condition and certain other conditions should receive both PCV13 and PPSV23 vaccines. People with human immunodeficiency virus (HIV) infection should be immunized as soon as possible after diagnosis. Immunization during chemotherapy or radiation therapy should be avoided. Routine use of PPSV23 vaccine is not recommended for  American Indians, [place] Natives, or people younger than 65 years unless there are medical conditions that require PPSV23 vaccine. When indicated, people who have unknown immunization and have no record of immunization should receive PPSV23 vaccine. One-time revaccination 5 years after the first dose of PPSV23 is recommended for people aged 19-64 years who have chronic kidney failure, nephrotic syndrome, asplenia, or immunocompromised conditions. People who received 1-2 doses of PPSV23 before age 4 years should receive another dose of PPSV23 vaccine at age 23 years or later if at least 5 years have passed since the previous dose. Doses of PPSV23 are not needed for people immunized with PPSV23 at or after age 73 years. ? Hepatitis A vaccine. Adults who wish to be protected from this disease, have certain high-risk conditions, work with hepatitis A-infected animals, work in hepatitis A research labs,  or travel to or work in countries with a high rate of hepatitis A should be immunized. Adults who were previously unvaccinated and who anticipate close contact with an international adoptee during the first 60 days after arrival in the [place] from a country with a high rate of hepatitis A should be immunized. ? Hepatitis B vaccine. Adults should be immunized if they wish to be protected from this disease, have certain high-risk conditions, may be exposed to blood or other infectious body fluids, are household contacts or sex partners of hepatitis B positive people, are clients or workers in certain care facilities, or travel to or work in countries with a high rate of hepatitis B.   Preventive Service / Frequency . Ages 27 to 73  Blood pressure check.  Lipid and cholesterol check  Lung cancer screening. / Every year if you are aged 32-80 years and have a 30-pack-year history of smoking and currently smoke or have quit within the past 15 years. Yearly screening is stopped once you have quit smoking  for at least 15 years or develop a health problem that would prevent you from having lung cancer treatment.  Fecal occult blood test (FOBT) of stool. / Every year beginning at age 60 and continuing until age 29. You may not have to do this test if you get a colonoscopy every 10 years.  Flexible sigmoidoscopy** or colonoscopy.** / Every 5 years for a flexible sigmoidoscopy or every 10 years for a colonoscopy beginning at age 84 and continuing until age 60. Screening for abdominal aortic aneurysm (AAA) by ultrasound is recommended for people who have history of high blood pressure or who are current or former smokers.  ++++++++++++++++++++++++++++++++++++++++++++++++++++++++++++++  Recommend Adult Low Dose Aspirin or coated Aspirin 81 mg daily To reduce risk of Colon Cancer 20 % Skin Cancer 26 %  Melanoma 46% and Pancreatic cancer 60%  +++++++++++++++++++++++++++++++++++++++++++++++++++++++++++++  Vitamin D goal is between 50-100. Please make sure that you are taking your Vitamin D as directed.  It is very important as a natural anti-inflammatory - helping with muscle and joint aches; as well as helping hair, skin, and nails; as well as reducing stroke, heart attack and cancer risk. It helps your bones and helps with mood. It also decreases numerous cancer risks so please take it as directed.  - Low Vit D is associated with a 200-300% higher risk for CANCER and 200-300% higher risk for HEART ATTACK & STROKE.  It is also associated with higher death rate at younger ages, autoimmune diseases like Rheumatoid arthritis, Lupus, Multiple Sclerosis; also many other serious conditions, like depression, Alzheimer's Dementia, infertility, muscle aches, fatigue, fibromyalgia - just to name a few.  +++++++++++++++++++++++++++++++++++++++++++++++++++++++++++  Recommend the book "The END of DIETING" by Dr Excell Seltzer & the book "The END of DIABETES " by Dr Excell Seltzer At [email or web address] - get book &  Audio CD's   --->Being diabetic has a 300% increased risk for heart attack, stroke, cancer, and alzheimer- type vascular dementia. It is very important that you work harder with diet by avoiding all foods that are white. Avoid white rice (brown & wild rice is OK), white potatoes (sweet potatoes in moderation is OK), White bread or wheat bread or anything made out of white flour like bagels, donuts, rolls, buns, biscuits, cakes, pastries, cookies, pizza crust, and pasta (made from white flour & egg whites) - vegetarian pasta or spinach or wheat pasta is OK. Multigrain breads like Arnold's or Pepper[employer], or multigrain  sandwich thins or flatbreads. Diet, exercise and weight loss can reverse and cure diabetes in the early stages. Diet, exercise and weight loss is very important in the control and prevention of complications of diabetes which affects every system in your body, ie. Brain - dementia/stroke, eyes - glaucoma/blindness, heart - heart attack/heart failure, kidneys - dialysis, stomach - gastric paralysis, intestines - malabsorption, nerves - severe painful neuritis, circulation - gangrene & loss of a leg(s), and finally cancer and Alzheimers.  I recommend avoid fried & greasy foods, sweets/candy, white rice (brown or wild rice or Quinoa is OK), white potatoes (sweet potatoes are OK) - anything made from white flour - bagels, doughnuts, rolls, buns, biscuits,white and wheat breads, pizza crust and traditional pasta made of white flour & egg white(vegetarian pasta or spinach or wheat pasta is OK). Multi-grain bread is OK - like multi-grain flat bread or sandwich thins. Avoid alcohol in excess.  Exercise is also important. Eat all the vegetables you want - avoid fatty meats, especially red meat and dairy - especially cheese. Cheese is the most concentrated form of trans-fats which is the worst thing to clog up our arteries. Veggie cheese is OK which can be found in the fresh produce section at  [employer] or [employer] or [employer].  ++++++++++++++++++++++ DASH Eating Plan  DASH stands for "Dietary Approaches to Stop Hypertension."  The DASH eating plan is a healthy eating plan that has been shown to reduce high blood pressure (hypertension). Additional health benefits may include reducing the risk of type 2 diabetes mellitus, heart disease, and stroke. The DASH eating plan may also help with weight loss.  WHAT DO I NEED TO KNOW ABOUT THE DASH EATING PLAN? For the DASH eating plan, you will follow these general guidelines: . Choose foods with a percent daily value for sodium of less than 5% (as listed on the food label). . Use salt-free seasonings or herbs instead of table salt or sea salt. . Check with your health care provider or pharmacist before using salt substitutes. . Eat lower-sodium products, often labeled as "lower sodium" or "no salt added." . Eat fresh foods. . Eat more vegetables, fruits, and low-fat dairy products. . Choose whole grains. Look for the word "whole" as the first word in the ingredient list. . Choose fish . Limit sweets, desserts, sugars, and sugary drinks. . Choose heart-healthy fats. . Eat veggie cheese . Eat more home-cooked food and less restaurant, buffet, and fast food. . Limit fried foods. Lacinda Axon foods using methods other than frying. . Limit canned vegetables. If you do use them, rinse them well to decrease the sodium. . When eating at a restaurant, ask that your food be prepared with less salt, or no salt if possible.   WHAT FOODS CAN I EAT? Read Dr Fara Olden Fuhrman's books on The End of Dieting & The End of Diabetes  Grains Whole grain or whole wheat bread. Brown rice. Whole grain or whole wheat pasta. Quinoa, bulgur, and whole grain cereals. Low-sodium cereals. Corn or whole wheat flour tortillas. Whole grain cornbread. Whole grain crackers. Low-sodium crackers.  Vegetables Fresh or frozen vegetables (raw, steamed,  roasted, or grilled). Low-sodium or reduced-sodium tomato and vegetable juices. Low-sodium or reduced-sodium tomato sauce and paste. Low-sodium or reduced-sodium canned vegetables.  Fruits All fresh, canned (in natural juice), or frozen fruits.  Protein Products All fish and seafood. Dried beans, peas, or lentils. Unsalted nuts and seeds. Unsalted canned beans.  Dairy Low-fat dairy products, such as  skim or 1% milk, 2% or reduced-fat cheeses, low-fat ricotta or cottage cheese, or plain low-fat yogurt. Low-sodium or reduced-sodium cheeses.  Fats and Oils Tub margarines without trans fats. Light or reduced-fat mayonnaise and salad dressings (reduced sodium). Avocado. Safflower, olive, or canola oils. Natural peanut or almond butter.  Other Unsalted popcorn and pretzels. The items listed above may not be a complete list of recommended foods or beverages. Contact your dietitian for more options.  ++++++++++++++++++++++++++++++++++++++++++++++++++++++++++++++++  WHAT FOODS ARE NOT RECOMMENDED?  Grains/ White flour or wheat flour White bread. White pasta. White rice. Refined cornbread. Bagels and croissants. Crackers that contain trans fat. Vegetables Creamed or fried vegetables. Vegetables in a . Regular canned vegetables. Regular canned tomato sauce and paste. Regular tomato and vegetable juices. Fruits Dried fruits. Canned fruit in light or heavy syrup. Fruit juice. Meat and Other Protein Products Meat in general - RED meat & White meat. Fatty cuts of meat. Ribs, chicken wings, all processed meats as bacon, sausage, bologna, salami, fatback, hot dogs, bratwurst and packaged luncheon meats. Dairy Whole or 2% milk, cream, half-and-half, and cream cheese. Whole-fat or sweetened yogurt. Full-fat cheeses or blue cheese. Non-dairy creamers and whipped toppings. Processed cheese, cheese spreads, or cheese curds.  Condiments Onion and garlic salt, seasoned salt, table salt, and sea salt.  Canned and packaged gravies. Worcestershire sauce. Tartar sauce. Barbecue sauce. Teriyaki sauce. Soy sauce, including reduced sodium. Steak sauce. Fish sauce. Oyster sauce. Cocktail sauce. Horseradish. Ketchup and mustard. Meat flavorings and tenderizers. Bouillon cubes. Hot sauce. Tabasco sauce. Marinades. Taco seasonings. Relishes. Fats and Oils Butter, stick margarine, lard, shortening and bacon fat. Coconut, palm kernel, or palm oils. Regular salad dressings. Pickles and olives. Salted popcorn and pretzels. The items listed above may not be a complete list of foods and beverages to avoid.

## 2017-04-29 NOTE — Assessment & Plan Note (Signed)
Importance of yearly skin examinations with his Dermatologist discussed with patient.

## 2017-04-29 NOTE — Assessment & Plan Note (Addendum)
We will obtain HCV RNA viral load today as per instructed through his infectious disease doctor Dr Linus Salmons;  who also said in November 2017 the patient should have hepatitis A and B immunization series as well.  - Hep A and B immunizations started today.  - Told pt in person and on AVS importance of exact dosing of these in future

## 2017-04-30 LAB — COMPREHENSIVE METABOLIC PANEL
ALBUMIN: 5 g/dL — AB (ref 3.6–4.8)
ALK PHOS: 63 IU/L (ref 39–117)
ALT: 13 IU/L (ref 0–44)
AST: 19 IU/L (ref 0–40)
Albumin/Globulin Ratio: 1.9 (ref 1.2–2.2)
BILIRUBIN TOTAL: 0.4 mg/dL (ref 0.0–1.2)
BUN / CREAT RATIO: 13 (ref 10–24)
BUN: 14 mg/dL (ref 8–27)
CHLORIDE: 101 mmol/L (ref 96–106)
CO2: 22 mmol/L (ref 20–29)
Calcium: 9.8 mg/dL (ref 8.6–10.2)
Creatinine, Ser: 1.12 mg/dL (ref 0.76–1.27)
GFR calc Af Amer: 78 mL/min/{1.73_m2} (ref >59)
GFR calc non Af Amer: 68 mL/min/{1.73_m2} (ref >59)
GLUCOSE: 95 mg/dL (ref 65–99)
Globulin, Total: 2.7 g/dL (ref 1.5–4.5)
Potassium: 4.4 mmol/L (ref 3.5–5.2)
SODIUM: 141 mmol/L (ref 134–144)
Total Protein: 7.7 g/dL (ref 6.0–8.5)

## 2017-04-30 LAB — CBC WITH DIFFERENTIAL/PLATELET
Basophils Absolute: 0 10*3/uL (ref 0.0–0.2)
Basos: 1 %
EOS (ABSOLUTE): 0.1 10*3/uL (ref 0.0–0.4)
Eos: 2 %
Hematocrit: 46.4 % (ref 37.5–51.0)
Hemoglobin: 15.4 g/dL (ref 13.0–17.7)
IMMATURE GRANULOCYTES: 0 %
Immature Grans (Abs): 0 10*3/uL (ref 0.0–0.1)
Lymphocytes Absolute: 1.2 10*3/uL (ref 0.7–3.1)
Lymphs: 15 %
MCH: 28.9 pg (ref 26.6–33.0)
MCHC: 33.2 g/dL (ref 31.5–35.7)
MCV: 87 fL (ref 79–97)
Monocytes Absolute: 0.6 10*3/uL (ref 0.1–0.9)
Monocytes: 8 %
NEUTROS PCT: 74 %
Neutrophils Absolute: 6 10*3/uL (ref 1.4–7.0)
PLATELETS: 218 10*3/uL (ref 150–379)
RBC: 5.33 x10E6/uL (ref 4.14–5.80)
RDW: 14.9 % (ref 12.3–15.4)
WBC: 8 10*3/uL (ref 3.4–10.8)

## 2017-04-30 LAB — LIPID PANEL
CHOLESTEROL TOTAL: 198 mg/dL (ref 100–199)
Chol/HDL Ratio: 5.5 ratio — ABNORMAL HIGH (ref 0.0–5.0)
HDL: 36 mg/dL — ABNORMAL LOW (ref >39)
LDL Calculated: 129 mg/dL — ABNORMAL HIGH (ref 0–99)
Triglycerides: 164 mg/dL — ABNORMAL HIGH (ref 0–149)
VLDL CHOLESTEROL CAL: 33 mg/dL (ref 5–40)

## 2017-04-30 LAB — TSH: TSH: 2.51 u[IU]/mL (ref 0.450–4.500)

## 2017-04-30 LAB — VITAMIN D 25 HYDROXY (VIT D DEFICIENCY, FRACTURES): Vit D, 25-Hydroxy: 35.5 ng/mL (ref 30.0–100.0)

## 2017-04-30 LAB — HCV RNA QUANT: Hepatitis C Quantitation: NOT DETECTED IU/mL

## 2017-04-30 LAB — HEMOGLOBIN A1C
ESTIMATED AVERAGE GLUCOSE: 111 mg/dL
HEMOGLOBIN A1C: 5.5 % (ref 4.8–5.6)

## 2017-04-30 LAB — T4, FREE: Free T4: 0.94 ng/dL (ref 0.82–1.77)

## 2017-04-30 LAB — VITAMIN B12: VITAMIN B 12: 1073 pg/mL (ref 232–1245)

## 2017-05-15 ENCOUNTER — Encounter: Payer: Self-pay | Admitting: Family Medicine

## 2017-05-15 ENCOUNTER — Ambulatory Visit (INDEPENDENT_AMBULATORY_CARE_PROVIDER_SITE_OTHER): Payer: Medicare Other | Admitting: Family Medicine

## 2017-05-15 VITALS — BP 122/78 | HR 68 | Ht 73.5 in | Wt 238.1 lb

## 2017-05-15 DIAGNOSIS — E786 Lipoprotein deficiency: Secondary | ICD-10-CM | POA: Diagnosis not present

## 2017-05-15 DIAGNOSIS — E782 Mixed hyperlipidemia: Secondary | ICD-10-CM

## 2017-05-15 DIAGNOSIS — N049 Nephrotic syndrome with unspecified morphologic changes: Secondary | ICD-10-CM | POA: Diagnosis not present

## 2017-05-15 DIAGNOSIS — R809 Proteinuria, unspecified: Secondary | ICD-10-CM | POA: Diagnosis not present

## 2017-05-15 DIAGNOSIS — E785 Hyperlipidemia, unspecified: Secondary | ICD-10-CM | POA: Insufficient documentation

## 2017-05-15 DIAGNOSIS — E559 Vitamin D deficiency, unspecified: Secondary | ICD-10-CM

## 2017-05-15 DIAGNOSIS — E781 Pure hyperglyceridemia: Secondary | ICD-10-CM | POA: Insufficient documentation

## 2017-05-15 MED ORDER — LOSARTAN POTASSIUM 25 MG PO TABS: 12.5000 mg | ORAL_TABLET | Freq: Every day | ORAL | 0 refills | Status: DC

## 2017-05-15 NOTE — Patient Instructions (Addendum)
Your vitamin D did improve but it still not at goal.  Please take double the amount of vitamin D you are currently taking.  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     Preventive Care for Adults  A healthy lifestyle and preventive care can promote health and wellness. Preventive health guidelines for men include the following key practices:  .   A routine yearly physical is a good way to check with your health care provider about your health and preventative screening. It is a chance to share any concerns and updates on your health and to receive a thorough exam.  .  Visit your dentist for a routine exam and preventative care every 6 months. Brush your teeth twice a day and floss once a day. Good oral hygiene prevents tooth decay and gum disease.  .  The frequency of eye exams is based on your age, health, family medical history, use of contact lenses, and other factors.  Follow your health care provider's recommendations for frequency of eye exams.  .  Eat a healthy diet.  Foods such as vegetables, fruits, whole grains, low-fat dairy products, and lean protein foods contain the nutrients you need without too many calories.  Decrease your intake of foods high in solid fats, added sugars, and salt.  Eat the right amount of calories for you.  Get information about a proper diet from your health care provider, if necessary.  .  Regular physical exercise is one of the most important things you can do for your health.  Most adults should get at least 150 minutes of moderate-intensity exercise (any activity that increases your heart rate and causes you to sweat) each week.  In addition, most adults need muscle-strengthening exercises on 2 or more days a week.  .  Maintain a healthy weight. The  body mass index (BMI) is a screening tool to identify possible weight problems. It provides an estimate of body fat based on height and weight. Your health care provider can find your BMI and can help you achieve or maintain a healthy weight. For adults 20 years and older: A BMI below 18.5 is considered underweight. A BMI of 18.5 to 24.9 is normal. A BMI of 25 to 29.9 is considered overweight. A BMI of 30 and above is considered obese.  .  Maintain normal blood lipids and cholesterol levels by exercising and minimizing your intake of saturated fat. Eat a balanced diet with plenty of fruit and vegetables. Blood tests for lipids and cholesterol should begin at age 79 and be repeated every 5 years. If your lipid or cholesterol levels are high, you are over 50, or you are at high risk for heart disease, you may need your cholesterol levels checked more frequently. Ongoing high lipid and cholesterol levels should be treated with medicines if diet and exercise are not working.  .  If you smoke, find out from your health care provider how to quit. If you do not use tobacco, do not start.  . If you choose to drink alcohol, do not have more than 2 drinks per day. One drink is considered to be 12 ounces (355 mL) of beer, 5 ounces (148 mL) of wine, or 1.5 ounces (44 mL) of liquor.  [place]  Avoid use of street drugs. Do not share needles with anyone. Ask for help if you need support or instructions about stopping the use of drugs.  . High blood pressure causes heart disease and increases the risk of stroke. Your blood pressure should be checked at least every 1-2 years. Ongoing high blood pressure should be treated with medicines, if weight loss and exercise are not effective.  . If you are 23-84 years old, ask your health care provider if you should take aspirin to prevent heart disease.  . Diabetes screening involves taking a blood sample to check your fasting blood sugar level.  This should be done once  every 3 years, after age 36, if you are within normal weight and without risk factors for diabetes.  Testing should be considered at a younger age or be carried out more frequently if you are overweight and have at least 1 risk factor for diabetes.  . Colorectal cancer can be detected and often prevented. Most routine colorectal cancer screening begins at the age of 88 and continues through age 17. However, your health care provider may recommend screening at an earlier age if you have risk factors for colon cancer. On a yearly basis, your health care provider may provide home test kits to check for hidden blood in the stool. Use of a small camera at the end of a tube to directly examine the colon (sigmoidoscopy or colonoscopy) can detect the earliest forms of colorectal cancer. Talk to your health care provider about this at age 44, when routine screening begins. Direct exam of the colon should be repeated every 5-10 years through age 51, unless early forms of precancerous polyps or small growths are found.  .  Lung cancer screening is recommended for adults aged 78-80 years who are at high risk for developing lung cancer because of a history of smoking. A yearly low-dose CT scan of the lungs is recommended for people who have at least a 30-pack-year history of smoking and are a current smoker or have quit within the past 15 years. A pack year of smoking is smoking an average of 1 pack of cigarettes a day for 1 year (for example: 1 pack a day for 30 years or 2 packs a day for 15 years). Yearly screening should continue until the smoker has stopped smoking for at least 15 years. Yearly screening should be stopped for people who develop a health problem that would prevent them from having lung cancer treatment.  . Talk with your health care provider about prostate cancer screening.  . Testicular cancer screening is recommended for adult males. Screening includes self-exam and a health care provider exam.  Consult with your health care provider about any symptoms you have or any concerns you have about testicular cancer.  . Use sunscreen. Apply sunscreen liberally and repeatedly throughout the day. You should seek shade when your shadow is shorter than you. Protect yourself by wearing long sleeves, pants, a wide-brimmed hat, and sunglasses year round, whenever you are outdoors.  . Once a month, do a whole-body skin exam, using a mirror to look at the skin on your back. Tell your health care provider about new moles, moles that have irregular borders, moles that are larger than a pencil eraser, or moles that have changed in shape or color.    ++++++++++++++++++++++++++++++++++++++++++++++++++++++++++++++++++  Stay current with required vaccines (immunizations).  ? Influenza vaccine. All adults should be immunized every year.  ? Tetanus, diphtheria, and acellular pertussis (Td,  Tdap) vaccine. An adult who has not previously received Tdap or who does not know his vaccine status should receive 1 dose of Tdap. This initial dose should be followed by tetanus and diphtheria toxoids (Td) booster doses every 10 years. Adults with an unknown or incomplete history of completing a 3-dose immunization series with Td-containing vaccines should begin or complete a primary immunization series including a Tdap dose. Adults should receive a Td booster every 10 years.  ? Varicella vaccine. An adult without evidence of immunity to varicella should receive 2 doses or a second dose if he has previously received 1 dose.  ? Human papillomavirus (HPV) vaccine. Males aged 16-21 years who have not received the vaccine previously should receive the 3-dose series. Males aged 22-26 years may be immunized. Immunization is recommended through the age of 60 years for any male who has sex with males and did not get any or all doses earlier. Immunization is recommended for any person with an immunocompromised condition through  the age of 44 years if he did not get any or all doses earlier. During the 3-dose series, the second dose should be obtained 4-8 weeks after the first dose. The third dose should be obtained 24 weeks after the first dose and 16 weeks after the second dose.  ? Zoster vaccine. One dose is recommended for adults aged 29 years or older unless certain conditions are present.   ? PREVNAR - Pneumococcal 13-valent conjugate (PCV13) vaccine. When indicated, a person who is uncertain of his immunization history and has no record of immunization should receive the PCV13 vaccine. An adult aged 57 years or older who has certain medical conditions and has not been previously immunized should receive 1 dose of PCV13 vaccine. This PCV13 should be followed with a dose of pneumococcal polysaccharide (PPSV23) vaccine. The PPSV23 vaccine dose should be obtained at least 8 weeks after the dose of PCV13 vaccine. An adult aged 34 years or older who has certain medical conditions and previously received 1 or more doses of PPSV23 vaccine should receive 1 dose of PCV13. The PCV13 vaccine dose should be obtained 1 or more years after the last PPSV23 vaccine dose.   ? PNEUMOVAX - Pneumococcal polysaccharide (PPSV23) vaccine. When PCV13 is also indicated, PCV13 should be obtained first. All adults aged 21 years and older should be immunized. An adult younger than age 7 years who has certain medical conditions should be immunized. Any person who resides in a nursing home or long-term care facility should be immunized. An adult smoker should be immunized. People with an immunocompromised condition and certain other conditions should receive both PCV13 and PPSV23 vaccines. People with human immunodeficiency virus (HIV) infection should be immunized as soon as possible after diagnosis. Immunization during chemotherapy or radiation therapy should be avoided. Routine use of PPSV23 vaccine is not recommended for American Indians, [place]  Natives, or people younger than 65 years unless there are medical conditions that require PPSV23 vaccine. When indicated, people who have unknown immunization and have no record of immunization should receive PPSV23 vaccine. One-time revaccination 5 years after the first dose of PPSV23 is recommended for people aged 19-64 years who have chronic kidney failure, nephrotic syndrome, asplenia, or immunocompromised conditions. People who received 1-2 doses of PPSV23 before age 60 years should receive another dose of PPSV23 vaccine at age 47 years or later if at least 5 years have passed since the previous dose. Doses of PPSV23 are not needed for people immunized with PPSV23  at or after age 86 years.     Preventive Service / Frequency ++++++++++++++++++++++++++++++++++++++++++++++++++++++++++++++  Recommend Adult Low Dose Aspirin or coated Aspirin 81 mg daily To reduce risk of Colon Cancer 20 % Skin Cancer 26 %  Melanoma 46% and Pancreatic cancer 60%  +++++++++++++++++++++++++++++++++++++++++++++++++++++++++++++  Vitamin D goal is between 50-100. Please make sure that you are taking your Vitamin D as directed.  It is very important as a natural anti-inflammatory - helping with muscle and joint aches; as well as helping hair, skin, and nails; as well as reducing stroke, heart attack and cancer risk. It helps your bones and helps with mood. It also decreases numerous cancer risks so please take it as directed.  - Low Vit D is associated with a 200-300% higher risk for CANCER and 200-300% higher risk for HEART ATTACK & STROKE.  It is also associated with higher death rate at younger ages, autoimmune diseases like Rheumatoid arthritis, Lupus, Multiple Sclerosis; also many other serious conditions, like depression, Alzheimer's Dementia, infertility, muscle aches, fatigue, fibromyalgia - just to name a few.  +++++++++++++++++++++++++++++++++++++++++++++++++++++++++++   Guidelines for a Low  Cholesterol, Low Saturated Fat Diet   Fats - Limit total intake of fats and oils. - Avoid butter, stick margarine, shortening, lard, palm and coconut oils. - Limit mayonnaise, salad dressings, gravies and sauces, unless they are homemade with low-fat ingredients. - Limit chocolate. - Choose low-fat and nonfat products, such as low-fat mayonnaise, low-fat or non-hydrogenated peanut butter, low-fat or fat-free salad dressings and nonfat gravy. - Use vegetable oil, such as canola or olive oil. - Look for margarine that does not contain trans fatty acids. - Use nuts in moderate amounts. - Read ingredient labels carefully to determine both amount and type of fat present in foods. Limit saturated and trans fats! - Avoid high-fat processed and convenience foods.  Meats and Meat Alternatives - Choose fish, chicken, [place] and lean meats. - Use dried beans, peas, lentils and tofu. - Limit egg yolks to three to four per week. - If you eat red meat, limit to no more than three servings per week and choose loin or round cuts. - Avoid fatty meats, such as bacon, sausage, franks, luncheon meats and ribs. - Avoid all organ meats, including liver.  Dairy - Choose nonfat or low-fat milk, yogurt and cottage cheese. - Most cheeses are high in fat. Choose cheeses made from non-fat milk, such as mozzarella and ricotta cheese. - Choose light or fat-free cream cheese and sour cream. - Avoid cream and sauces made with cream.  Fruits and Vegetables - Eat a wide variety of fruits and vegetables. - Use lemon juice, vinegar or "mist" olive oil on vegetables. - Avoid adding sauces, fat or oil to vegetables.  Breads, Cereals and Grains - Choose whole-grain breads, cereals, pastas and rice. - Avoid high-fat snack foods, such as granola, cookies, pies, pastries, doughnuts and croissants.  Cooking Tips - Avoid deep fried foods. - Trim visible fat off meats and remove skin from poultry before cooking. - Bake,  broil, boil, poach or roast poultry, fish and lean meats. - Drain and discard fat that drains out of meat as you cook it. - Add little or no fat to foods. - Use vegetable oil sprays to grease pans for cooking or baking. - Steam vegetables. - Use herbs or no-oil marinades to flavor foods.

## 2017-05-15 NOTE — Progress Notes (Signed)
Assessment and plan:  1. Positive for macroalbuminuria   2. Berger's disease: IgA Nephropathy    3. Mixed hyperlipidemia   4. Low level of high density lipoprotein (HDL)   5. Hypertriglyceridemia   6. Vitamin D insufficiency    Your vitamin D did improve but it still not at goal.  Please take double the amount of vitamin D you are currently taking.  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  Pt was in the office today for 25+ minutes, with over 50% time spent in face to face counseling of patient's various medical conditions and in coordination of care  Meds ordered this encounter  Medications  . losartan (COZAAR) 25 MG tablet    Sig: Take 0.5 tablets (12.5 mg total) by mouth daily.    Dispense:  45 tablet    Refill:  0    Discontinued Medications   No medications on file    Modified Medications   No medications on file     No orders of the defined types were placed in this encounter.    Return for 69mo- chronic care f/up.  Anticipatory guidance and routine counseling done re: condition, txmnt options and need for follow up. All questions of patient's were answered.   Gross side effects, risk and benefits, and alternatives of medications discussed with patient.  Patient is aware that all medications have potential side effects and we are unable to predict every sideeffect or drug-drug interaction that may occur.  Expresses verbal understanding and consents to current therapy plan and treatment regiment.  Please see AVS handed out to patient at the end of our visit for additional patient instructions/ counseling done pertaining to today's office visit.  Note: This document was prepared using Dragon voice  recognition software and may include unintentional dictation errors.   ----------------------------------------------------------------------------------------------------------------------  Subjective:   CC:   George [name] is a 66 y.o. male who presents to [place] at [facility] today for review and discussion of recent bloodwork that was done.  1. All recent blood work that we ordered was reviewed with patient today.  Patient was counseled on all abnormalities and we discussed dietary and lifestyle changes that could help those values (also medications when appropriate).  Extensive health counseling performed and all patient's concerns/ questions were addressed.  2.     Wt Readings from Last 3 Encounters:  05/15/17 238 lb 1.6 oz (108 kg)  04/29/17 237 lb 3.2 oz (107.6 kg)  02/24/17 232 lb (105.2 kg)   BP Readings from Last 3 Encounters:  05/15/17 122/78  04/29/17 (!) 160/90  02/24/17 115/79   Pulse Readings from Last 3 Encounters:  05/15/17 68  04/29/17 62  02/24/17 71   BMI Readings from Last 3 Encounters:  05/15/17 30.99 kg/m  04/29/17 30.87 kg/m  02/24/17 30.19 kg/m     Patient Care Team    Relationship Specialty Notifications Start End  [name], DO PCP - General Family Medicine  03/03/16   Lorretta Harp, MD Consulting Physician Cardiology  04/05/16   Estanislado Emms, MD Consulting Physician Nephrology  04/05/16   Evans Lance, MD Consulting Physician Clinical Cardiac Electrophysiology  04/05/16    Comment: his  EP  DOC  Comer, Okey Regal, MD Consulting Physician Infectious Diseases  04/05/16    Comment: Hep C txmnt  Sharmon Revere Physician Assistant Cardiology  04/05/16    Comment: follows pt's fluid status---> L Ext edema  Dillingham, Loel Lofty, DO Attending Physician Plastic Surgery  04/05/16    Comment: Dr. Theodoro Kos   ( plastics and reconstuctive sx at [facility]) sees her in [place] at [facility];  she is who has  diagnosed him with Berger's disease    Full medical history updated and reviewed in the office today  Patient Active Problem List   Diagnosis Date Noted  . 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 Alcoholism /alcohol abuse- episodic binges 04/05/2016    Priority: High  . 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 Insomnia 04/05/2016    Priority: High  . Noncompliance with diet and medication regimen 04/05/2016    Priority: High  . Signficant h/o tobacco abuse (150pk/yr hx) 03/03/2016    Priority: High  . h/o Basal cell carcinoma of nose 03/03/2016    Priority: High  . GAD (generalized anxiety disorder) 03/03/2016    Priority: High  . h/o Hepatitis C infection 11/14/2015    Priority: High  . COPD mixed type ([place]) 10/12/2015    Priority: High  . AAA -   (f/up US 12/2018) 09/24/2015    Priority: High  . h/o Hypothyroidism 09/22/2015    Priority: High  . Berger's disease: IgA Nephropathy  10/12/2015    Priority: Medium  . Venous insufficiency of both lower extremities 09/21/2015    Priority: Medium  . H/O CHF 08/20/2015    Priority: Medium  . h/o SVT (supraventricular tachycardia) s/p Ablation 05/22/2015    Priority: Medium  . Obesity, Class I, BMI 30-34.9 04/29/2017    Priority: Low  . Elevated blood pressure reading- situational 04/29/2017    Priority: Low  . History of elevated glucose 04/29/2017    Priority: Low  . Colonoscopy refused 04/29/2017    Priority: Low  . Vitamin D deficiency 05/19/2016    Priority: Low  . Liver fibrosis 03/24/2016    Priority: Low  . Positive for macroalbuminuria 05/15/2017  . HLD (hyperlipidemia) 05/15/2017  . Low level of high density lipoprotein (HDL) 05/15/2017  . Hypertriglyceridemia 05/15/2017    Past Medical History:  Diagnosis Date  . AAA (abdominal aortic aneurysm) ([place])    a. [place] 09/2015 - 3cm distal AAA. //  b. [place] 5/17: 3.1 cm   . Basal cell carcinoma of  right side of nose   . Berger's disease   . CKD (chronic kidney disease) 09/2015  . GERD (gastroesophageal reflux disease)   . Nephrotic syndrome   . Panic attacks   . SVT (supraventricular tachycardia) ([place]) 05/22/2015    Past Surgical History:  Procedure Laterality Date  . BASAL CELL CARCINOMA EXCISION Right ~ 06/2015   "side of my nose"  . ELECTROPHYSIOLOGIC STUDY N/A 09/10/2015   Procedure: SVT Ablation;  Surgeon: Evans Lance, MD;  Location: [place] [facility];  Service: Cardiovascular;  Laterality: N/A;  . FEMUR FRACTURE SURGERY Right 1970   "had plate & pin put in"  . FEMUR HARDWARE REMOVAL Right 1971   "removed plate; left pin in  . FRACTURE SURGERY    . KNEE ARTHROSCOPY Right ~ 1985  . TONSILLECTOMY AND ADENOIDECTOMY  ~ 1955    Social History  Substance Use Topics  . Smoking status: Former Smoker    Packs/day: 2.00    Years: 50.00    Types: Cigarettes    Quit date: 01/24/2015  . Smokeless tobacco: Never Used  . Alcohol use 1.8 - 2.4 oz/week    3 - 4 Shots of liquor per week    Family Hx: Family History  Problem Relation Age of Onset  . Heart disease Mother        Before age 63  . Heart attack Mother   . Cancer Mother        lung and colon  . Stroke Mother   . Heart failure Father   . Heart disease Father        After age 39  . Heart attack Father   . Hyperlipidemia Father   . Healthy Maternal Grandmother   . Healthy Maternal Grandfather   . Diabetes Paternal Grandmother   . Healthy Paternal Grandfather   . Hypertension Neg Hx      Medications: Current Outpatient Prescriptions  Medication Sig Dispense Refill  . aspirin EC 81 MG EC tablet Take 1 tablet (81 mg total) by mouth daily.    . Aspirin-Salicylamide-Caffeine (BC HEADACHE POWDER PO) Take 1 Package by mouth daily as needed (Body Aches).    . citalopram (CELEXA) 20 MG tablet Take 1 tablet daily. 90 tablet 1  . zolpidem (AMBIEN CR) 12.5 MG CR tablet Take 1 tablet (12.5 mg total) by mouth at  bedtime as needed for sleep. 90 tablet 1  . Cholecalciferol (VITAMIN D3) 5000 units CAPS Take 1 capsule by mouth daily.    Marland Kitchen losartan (COZAAR) 25 MG tablet Take 0.5 tablets (12.5 mg total) by mouth daily. 45 tablet 0   No current facility-administered medications for this visit.     Allergies:  No Known Allergies   Review of Systems: General:   No F/C, wt loss Pulm:   No DIB, SOB, pleuritic chest pain Card:  No CP, palpitations Abd:  No n/v/d or pain Ext:  No inc edema from baseline  Objective:  Blood pressure 122/78, pulse 68, height 6' 1.5" (1.867 m), weight 238 lb 1.6 oz (108 kg). Body mass index is 30.99 kg/m. Gen:   Well NAD, A and O *3 HEENT:    [place]/AT, EOMI,  MMM Lungs:   Normal work of breathing. CTA B/L, no Wh, rhonchi Heart:   RRR, S1, S2 WNL's, no MRG Abd:   No gross distention Exts:    warm, pink,  Brisk capillary refill, warm and well perfused.  Psych:    No HI/SI, judgement and insight good, Euthymic mood. Full Affect.   Recent Results (from the past 2160 hour(s))  Comprehensive metabolic panel     Status: Abnormal   Collection Time: 02/24/17  1:41 PM  Result Value Ref Range   Glucose 74 65 - 99 mg/dL    Comment: Specimen received in contact with cells. No visible hemolysis present. However GLUC may be decreased and K increased. Clinical correlation indicated.    BUN 11 8 - 27 mg/dL   Creatinine, Ser 1.03 0.76 - 1.27 mg/dL   GFR calc non Af Amer 75 >59 mL/min/1.73   GFR calc Af Amer 86 >59 mL/min/1.73   BUN/Creatinine Ratio 11 10 - 24   Sodium 145 (H) 134 - 144 mmol/L   Potassium 5.6 (H) 3.5 - 5.2 mmol/L    Comment: Specimen received in contact with cells. No visible hemolysis present. However GLUC may be decreased  and K increased. Clinical correlation indicated.    Chloride 105 96 - 106 mmol/L   CO2 22 20 - 29 mmol/L   Calcium 9.6 8.6 - 10.2 mg/dL   Total Protein 6.9 6.0 - 8.5 g/dL   Albumin 4.7 3.6 - 4.8 g/dL   Globulin, Total 2.2 1.5 - 4.5 g/dL     Albumin/Globulin Ratio 2.1 1.2 - 2.2   Bilirubin Total 0.8 0.0 - 1.2 mg/dL   Alkaline Phosphatase 52 39 - 117 IU/L   AST 19 0 - 40 IU/L   ALT 15 0 - 44 IU/L  HCV RNA quant     Status: None   Collection Time: 04/29/17 10:50 AM  Result Value Ref Range   Hepatitis C Quantitation HCV Not Detected IU/mL   Test Information Comment     Comment: The quantitative range of this assay is 15 IU/mL to 100 million IU/mL.  Comprehensive metabolic panel     Status: Abnormal   Collection Time: 04/29/17 10:50 AM  Result Value Ref Range   Glucose 95 65 - 99 mg/dL   BUN 14 8 - 27 mg/dL   Creatinine, Ser 1.12 0.76 - 1.27 mg/dL   GFR calc non Af Amer 68 >59 mL/min/1.73   GFR calc Af Amer 78 >59 mL/min/1.73   BUN/Creatinine Ratio 13 10 - 24   Sodium 141 134 - 144 mmol/L   Potassium 4.4 3.5 - 5.2 mmol/L   Chloride 101 96 - 106 mmol/L   CO2 22 20 - 29 mmol/L   Calcium 9.8 8.6 - 10.2 mg/dL   Total Protein 7.7 6.0 - 8.5 g/dL   Albumin 5.0 (H) 3.6 - 4.8 g/dL   Globulin, Total 2.7 1.5 - 4.5 g/dL   Albumin/Globulin Ratio 1.9 1.2 - 2.2   Bilirubin Total 0.4 0.0 - 1.2 mg/dL   Alkaline Phosphatase 63 39 - 117 IU/L   AST 19 0 - 40 IU/L   ALT 13 0 - 44 IU/L  Lipid panel     Status: Abnormal   Collection Time: 04/29/17 10:50 AM  Result Value Ref Range   Cholesterol, Total 198 100 - 199 mg/dL   Triglycerides 164 (H) 0 - 149 mg/dL   HDL 36 (L) >39 mg/dL   VLDL Cholesterol Cal 33 5 - 40 mg/dL   LDL Calculated 129 (H) 0 - 99 mg/dL   Chol/HDL Ratio 5.5 (H) 0.0 - 5.0 ratio    Comment:                                   T. Chol/HDL Ratio                                             Men  Women                               1/2 Avg.Risk  3.4    3.3                                   Avg.Risk  5.0    4.4  2X Avg.Risk  9.6    7.1                                3X Avg.Risk 23.4   11.0   T4, free     Status: None   Collection Time: 04/29/17 10:50 AM  Result Value Ref Range   Free T4  0.94 0.82 - 1.77 ng/dL  TSH     Status: None   Collection Time: 04/29/17 10:50 AM  Result Value Ref Range   TSH 2.510 0.450 - 4.500 uIU/mL  Vitamin B12     Status: None   Collection Time: 04/29/17 10:50 AM  Result Value Ref Range   Vitamin B-12 1,073 232 - 1,245 pg/mL  VITAMIN D 25 Hydroxy (Vit-D Deficiency, Fractures)     Status: None   Collection Time: 04/29/17 10:50 AM  Result Value Ref Range   Vit D, 25-Hydroxy 35.5 30.0 - 100.0 ng/mL    Comment: Vitamin D deficiency has been defined by the [place] and an Endocrine Society practice guideline as a level of serum 25-OH vitamin D less than 20 ng/mL (1,2). The Endocrine Society went on to further define vitamin D insufficiency as a level between 21 and 29 ng/mL (2). 1. IOM (Institute of Medicine). 2010. Dietary reference    intakes for calcium and D. [place]: The    [employer]. 2. Holick MF, Binkley [place], Bischoff-Ferrari HA, et al.    Evaluation, treatment, and prevention of vitamin D    deficiency: an Endocrine Society clinical practice    guideline. JCEM. 2011 Jul; 96(7):1911-30.   CBC with Differential/Platelet     Status: None   Collection Time: 04/29/17 10:50 AM  Result Value Ref Range   WBC 8.0 3.4 - 10.8 x10E3/uL   RBC 5.33 4.14 - 5.80 x10E6/uL   Hemoglobin 15.4 13.0 - 17.7 g/dL   Hematocrit 46.4 37.5 - 51.0 %   MCV 87 79 - 97 fL   MCH 28.9 26.6 - 33.0 pg   MCHC 33.2 31.5 - 35.7 g/dL   RDW 14.9 12.3 - 15.4 %   Platelets 218 150 - 379 x10E3/uL   Neutrophils 74 Not Estab. %   Lymphs 15 Not Estab. %   Monocytes 8 Not Estab. %   Eos 2 Not Estab. %   Basos 1 Not Estab. %   Neutrophils Absolute 6.0 1.4 - 7.0 x10E3/uL   Lymphocytes Absolute 1.2 0.7 - 3.1 x10E3/uL   Monocytes Absolute 0.6 0.1 - 0.9 x10E3/uL   EOS (ABSOLUTE) 0.1 0.0 - 0.4 x10E3/uL   Basophils Absolute 0.0 0.0 - 0.2 x10E3/uL   Immature Granulocytes 0 Not Estab. %   Immature Grans (Abs) 0.0 0.0 - 0.1 x10E3/uL  Hemoglobin  A1c     Status: None   Collection Time: 04/29/17 10:50 AM  Result Value Ref Range   Hgb A1c MFr Bld 5.5 4.8 - 5.6 %    Comment:          Prediabetes: 5.7 - 6.4          Diabetes: >6.4          Glycemic control for adults with diabetes: <7.0    Est. average glucose Bld gHb Est-mCnc 111 mg/dL  POCT UA - Microalbumin     Status: Abnormal   Collection Time: 04/29/17 11:51 AM  Result Value Ref Range   Microalbumin Ur, POC 150 mg/L   Creatinine,  POC 100 mg/dL   Albumin/Creatinine Ratio, Urine, POC >300

## 2017-05-18 ENCOUNTER — Telehealth: Payer: Self-pay | Admitting: Family Medicine

## 2017-07-01 IMAGING — CT CT ANGIO CHEST
2 of 6 series · 18 of 36 positions shown · IV contrast (Omni 300)
Comparison: Chest radiograph dated 08/22/2015

CLINICAL DATA: 64-year-old male with shortness of breath,
dizziness, and weakness. Concern for pulmonary embolism.

EXAM:
CT ANGIOGRAPHY CHEST WITH CONTRAST
TECHNIQUE: Multidetector CT imaging of the chest was performed using the
standard protocol during bolus administration of intravenous
contrast. Multiplanar CT image reconstructions and MIPs were
obtained to evaluate the vascular anatomy.
CONTRAST:  60mL OMNIPAQUE IOHEXOL 350 MG/ML SOLN

[Series 6: pe thins · axial · 0.74mm/px · z∈[-347,-34]mm · 17 of 691 slices shown]
[im 33/691  lung]
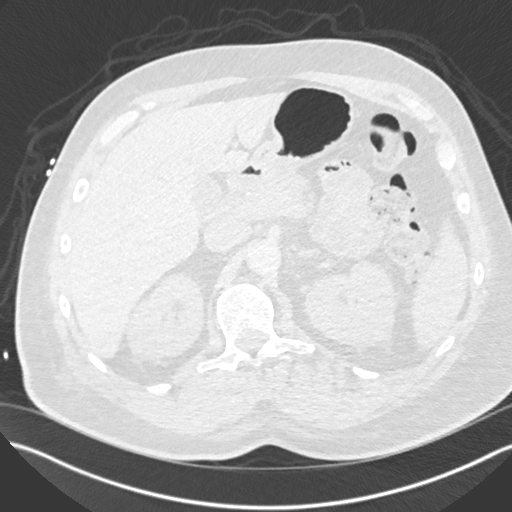
[im 66/691  mediastinal]
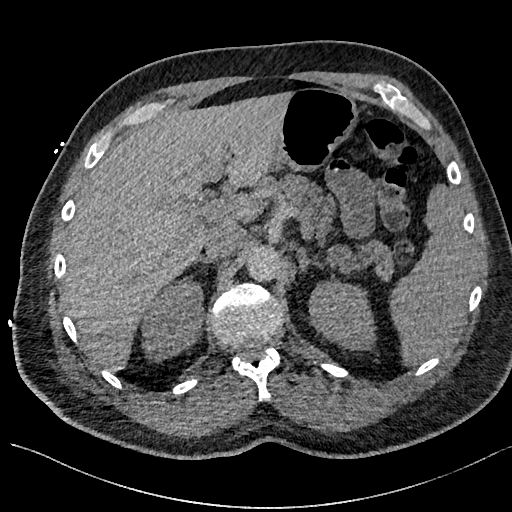
[im 99/691  lung]
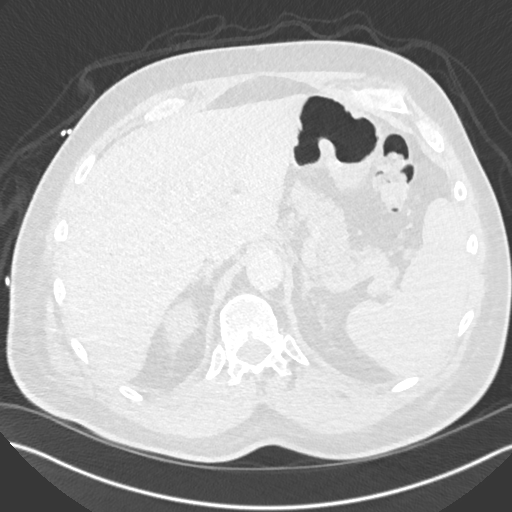
[im 165/691  mediastinal]
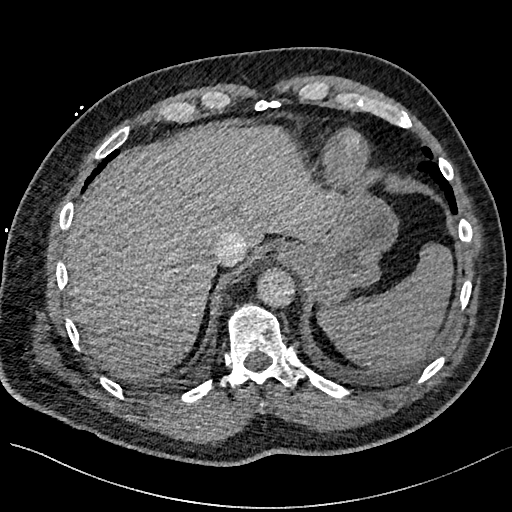
[im 198/691  lung]
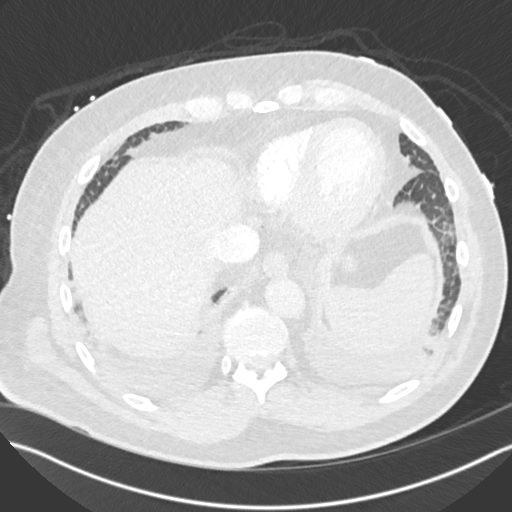
[im 230/691  mediastinal]
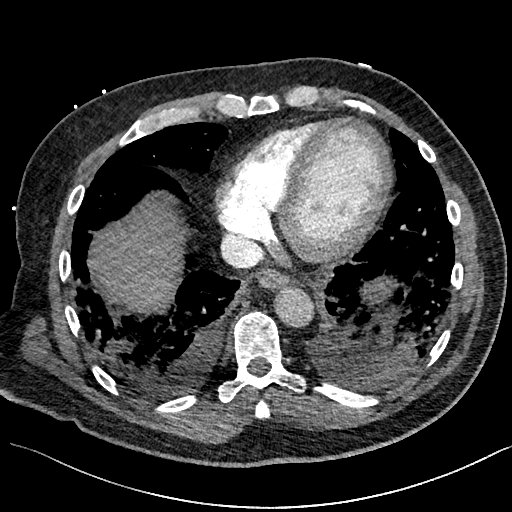
[im 263/691  lung]
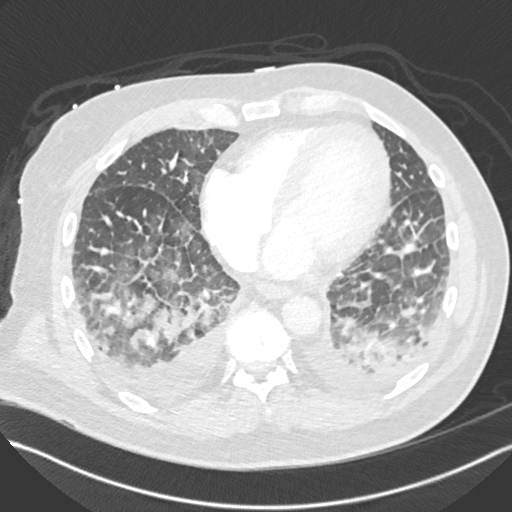
[im 296/691  mediastinal]
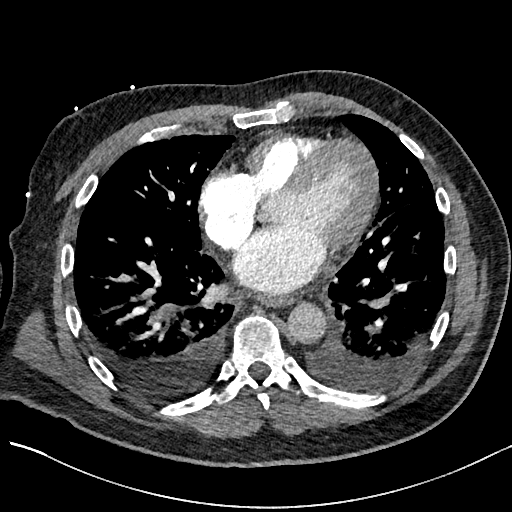
[im 362/691  lung]
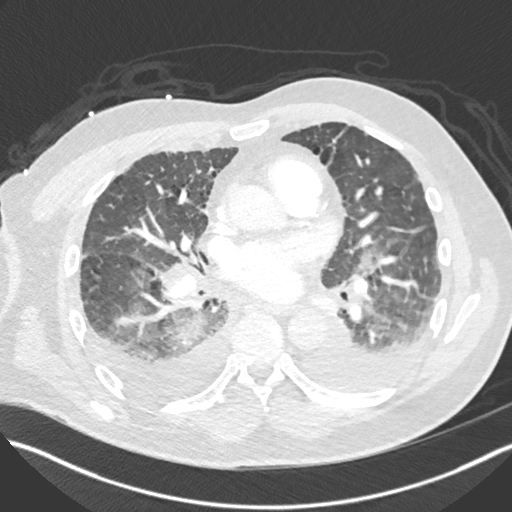
[im 395/691  mediastinal]
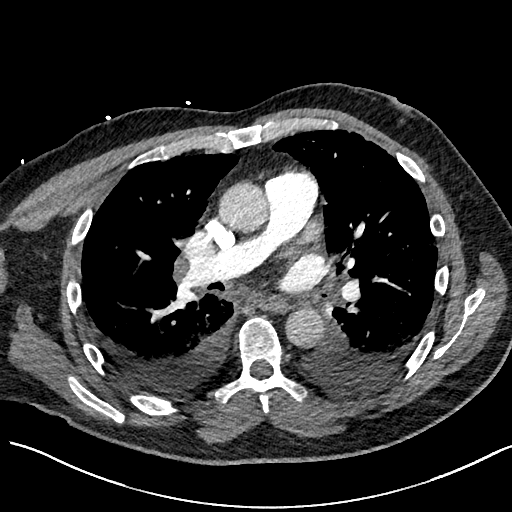
[im 428/691  lung]
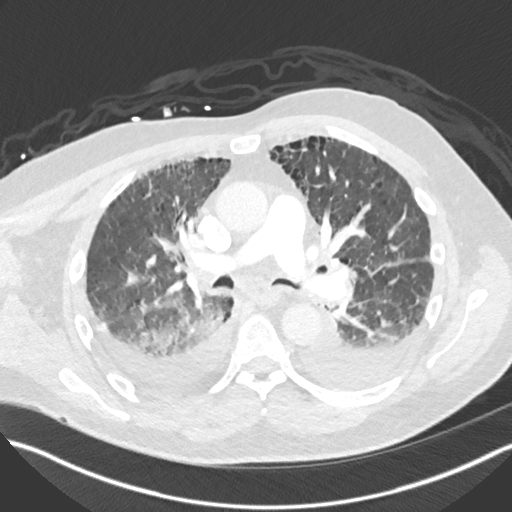
[im 460/691  mediastinal]
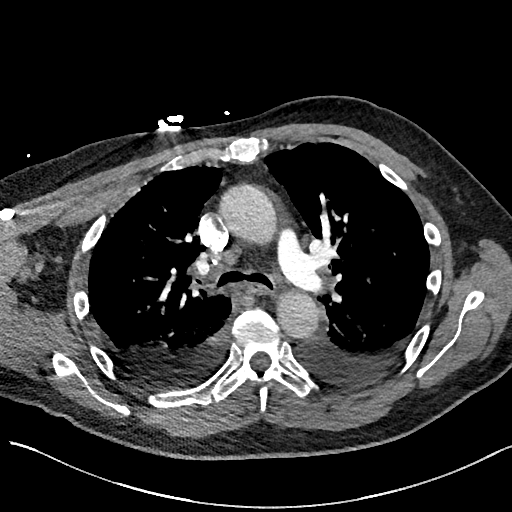
[im 493/691  lung]
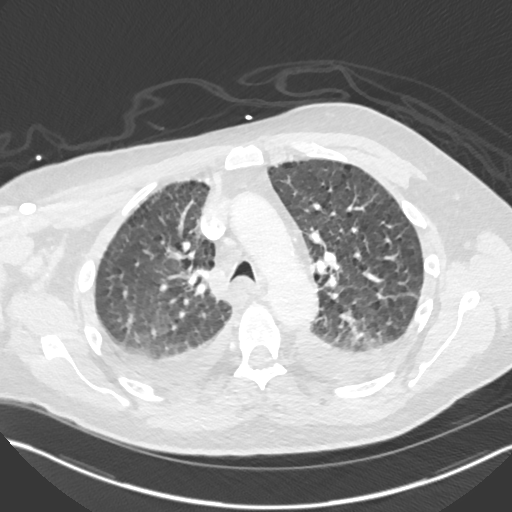
[im 526/691  mediastinal]
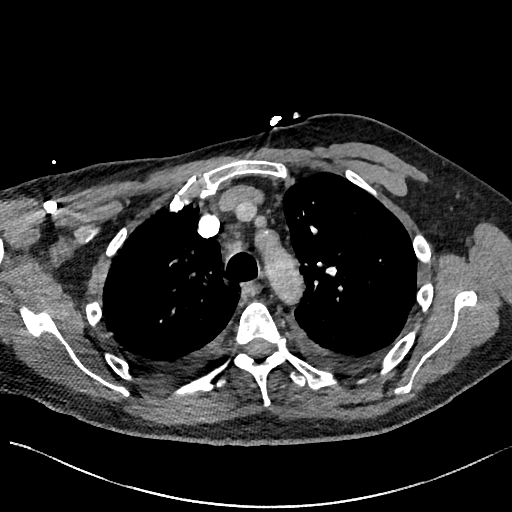
[im 592/691  lung]
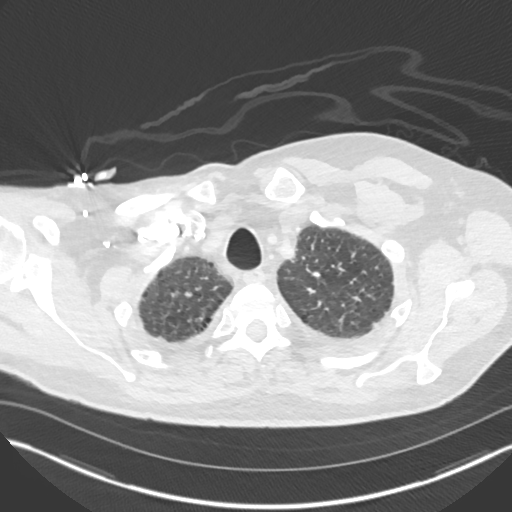
[im 625/691  mediastinal]
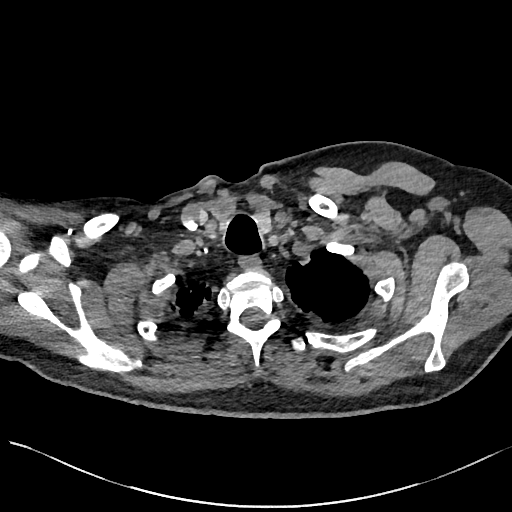
[im 658/691  lung]
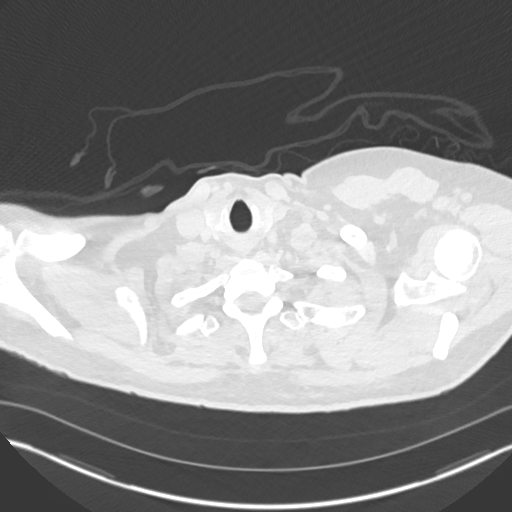

[Series 7: pe 2mm cor · coronal · 0.68mm/px · 1 of 137 slices shown]
[im 69/137  mediastinal]
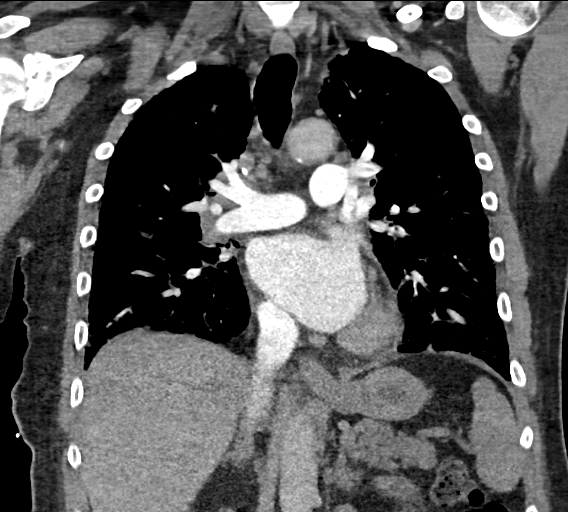

[18 of 36 positions shown; findings below may reference images not displayed]

FINDINGS: There is paraseptal emphysema. There are small bilateral pleural
effusions. There is patchy areas of ground-glass airspace opacity
predominantly at the lung bases. There is diffuse interstitial
prominence and interlobular septal thickening compatible with
congestive changes or edema. Superimposed pneumonia is not excluded.
Clinical correlation and follow-up recommended. There is no
pneumothorax. The central airways are patent. There is thickening of
the central bronchi which may represent bronchopneumonia.

The thoracic aorta appears unremarkable. No CT evidence of pulmonary
embolism. Bilateral hilar adenopathy. There is no cardiomegaly or
pericardial effusion. There is coronary vascular calcification. The
visualized esophagus appears unremarkable. No thyroid nodule
identified.

There is no axillary adenopathy. The chest wall soft tissues appear
unremarkable there is mild degenerative changes of the spine. The
osseous structures are intact. The visualized upper abdomen is
unremarkable.

Review of the MIP images confirms the above findings.
IMPRESSION: No CT evidence of pulmonary embolism.

Small bilateral pleural effusions with congestive changes or edema.

Bilateral peribronchial thickening and ground-glass airspace
haziness may represent bronchopneumonia. Clinical correlation and
follow-up recommended.

## 2017-08-10 ENCOUNTER — Other Ambulatory Visit: Payer: Self-pay | Admitting: Family Medicine

## 2017-08-10 DIAGNOSIS — R809 Proteinuria, unspecified: Secondary | ICD-10-CM

## 2017-08-10 DIAGNOSIS — N049 Nephrotic syndrome with unspecified morphologic changes: Secondary | ICD-10-CM

## 2017-08-17 ENCOUNTER — Telehealth: Payer: Self-pay | Admitting: Family Medicine

## 2017-08-17 NOTE — Telephone Encounter (Signed)
Pt informed RX for 90 day supply sent to pharmacy on 08/10/17.  Pt expressed understanding.  Charyl Bigger, CMA

## 2017-08-17 NOTE — Telephone Encounter (Signed)
Pt came by for provider to refill Rx :  losartan (COZAAR) 25 MG tablet [[id number]]  Order Details  Dose: 12.5 mg Route: Oral Frequency: Daily  Dispense Quantity: 45 tablet Refills: 0 Fills remaining: --        Sig: TAKE 0.5 TABLETS (12.5 MG TOTAL) BY MOUTH DAILY.     Pt uses : patient has only 2 & 1/2 pills left.  Pharmacy:  [id number] - [place], [place] - [address]. DEA #:  [id number]   ---glh

## 2017-08-27 ENCOUNTER — Other Ambulatory Visit: Payer: Self-pay | Admitting: Family Medicine

## 2017-08-27 DIAGNOSIS — F5104 Psychophysiologic insomnia: Secondary | ICD-10-CM

## 2017-08-27 NOTE — Telephone Encounter (Signed)
Please review. Thanks. MPulliam, CMA/RT(R)

## 2017-08-31 ENCOUNTER — Ambulatory Visit: Payer: Medicare Other | Admitting: Family Medicine

## 2017-09-15 ENCOUNTER — Ambulatory Visit (INDEPENDENT_AMBULATORY_CARE_PROVIDER_SITE_OTHER): Payer: Medicare Other | Admitting: Family Medicine

## 2017-09-15 ENCOUNTER — Encounter: Payer: Self-pay | Admitting: Family Medicine

## 2017-09-15 VITALS — BP 138/87 | HR 61 | Ht 73.5 in | Wt 239.1 lb

## 2017-09-15 DIAGNOSIS — F5104 Psychophysiologic insomnia: Secondary | ICD-10-CM | POA: Diagnosis not present

## 2017-09-15 DIAGNOSIS — J449 Chronic obstructive pulmonary disease, unspecified: Secondary | ICD-10-CM

## 2017-09-15 DIAGNOSIS — F5105 Insomnia due to other mental disorder: Secondary | ICD-10-CM | POA: Diagnosis not present

## 2017-09-15 DIAGNOSIS — F409 Phobic anxiety disorder, unspecified: Secondary | ICD-10-CM | POA: Insufficient documentation

## 2017-09-15 DIAGNOSIS — R809 Proteinuria, unspecified: Secondary | ICD-10-CM

## 2017-09-15 DIAGNOSIS — Z9114 Patient's other noncompliance with medication regimen: Secondary | ICD-10-CM

## 2017-09-15 DIAGNOSIS — N049 Nephrotic syndrome with unspecified morphologic changes: Secondary | ICD-10-CM | POA: Diagnosis not present

## 2017-09-15 DIAGNOSIS — Z87891 Personal history of nicotine dependence: Secondary | ICD-10-CM | POA: Diagnosis not present

## 2017-09-15 DIAGNOSIS — F411 Generalized anxiety disorder: Secondary | ICD-10-CM | POA: Diagnosis not present

## 2017-09-15 DIAGNOSIS — Z9111 Patient's noncompliance with dietary regimen: Secondary | ICD-10-CM | POA: Diagnosis not present

## 2017-09-15 DIAGNOSIS — I151 Hypertension secondary to other renal disorders: Secondary | ICD-10-CM

## 2017-09-15 DIAGNOSIS — Z91119 Patient's noncompliance with dietary regimen due to unspecified reason: Secondary | ICD-10-CM

## 2017-09-15 MED ORDER — CITALOPRAM HYDROBROMIDE 20 MG PO TABS: ORAL_TABLET | ORAL | 1 refills | Status: DC

## 2017-09-15 MED ORDER — LOSARTAN POTASSIUM 50 MG PO TABS: 50.0000 mg | ORAL_TABLET | Freq: Every day | ORAL | 3 refills | Status: DC

## 2017-09-15 MED ORDER — ZOLPIDEM TARTRATE ER 12.5 MG PO TBCR: 12.5000 mg | EXTENDED_RELEASE_TABLET | Freq: Every evening | ORAL | 1 refills | Status: DC | PRN

## 2017-09-15 NOTE — Patient Instructions (Signed)
We went up on your blood pressure medicine from 25 mg daily to 50.  Just take 2 of your 25 and then get the refill.  Also check your blood pressure and please follow-up sooner than planned if it does not remain at goal of under 140/90 on a regular basis  Check out the DASH diet = 1.5 Gram Low Sodium Diet   A 1.5 gram sodium diet restricts the amount of sodium in the diet to no more than 1.5 g or 1500 mg daily.  The American Heart Association recommends Americans over the age of 55 to consume no more than 1500 mg of sodium each day to reduce the risk of developing high blood pressure.  Research also shows that limiting sodium may reduce heart attack and stroke risk.  Many foods contain sodium for flavor and sometimes as a preservative.  When the amount of sodium in a diet needs to be low, it is important to know what to look for when choosing foods and drinks.  The following includes some information and guidelines to help make it easier for you to adapt to a low sodium diet.    QUICK TIPS  Do not add salt to food.  Avoid convenience items and fast food.  Choose unsalted snack foods.  Buy lower sodium products, often labeled as "lower sodium" or "no salt added."  Check food labels to learn how much sodium is in 1 serving.  When eating at a restaurant, ask that your food be prepared with less salt or none, if possible.    READING FOOD LABELS FOR SODIUM INFORMATION  The nutrition facts label is a good place to find how much sodium is in foods. Look for products with no more than 400 mg of sodium per serving.  Remember that 1.5 g = 1500 mg.  The food label may also list foods as:  Sodium-free: Less than 5 mg in a serving.  Very low sodium: 35 mg or less in a serving.  Low-sodium: 140 mg or less in a serving.  Light in sodium: 50% less sodium in a serving. For example, if a food that usually has 300 mg of sodium is changed to become light in sodium, it will have 150 mg of sodium.  Reduced sodium:  25% less sodium in a serving. For example, if a food that usually has 400 mg of sodium is changed to reduced sodium, it will have 300 mg of sodium.    CHOOSING FOODS  Grains  Avoid: Salted crackers and snack items. Some cereals, including instant hot cereals. Bread stuffing and biscuit mixes. Seasoned rice or pasta mixes.  Choose: Unsalted snack items. Low-sodium cereals, oats, puffed wheat and rice, shredded wheat. English muffins and bread. Pasta.  Meats  Avoid: Salted, canned, smoked, spiced, pickled meats, including fish and poultry. Bacon, ham, sausage, cold cuts, hot dogs, anchovies.  Choose: Low-sodium canned tuna and salmon. Fresh or frozen meat, poultry, and fish.  Dairy  Avoid: Processed cheese and spreads. Cottage cheese. Buttermilk and condensed milk. Regular cheese.  Choose: Milk. Low-sodium cottage cheese. Yogurt. Sour cream. Low-sodium cheese.  Fruits and Vegetables  Avoid: Regular canned vegetables. Regular canned tomato sauce and paste. Frozen vegetables in sauces. Olives. Angie Fava. Relishes. Sauerkraut.  Choose: Low-sodium canned vegetables. Low-sodium tomato sauce and paste. Frozen or fresh vegetables. Fresh and frozen fruit.  Condiments  Avoid: Canned and packaged gravies. Worcestershire sauce. Tartar sauce. Barbecue sauce. Soy sauce. Steak sauce. Ketchup. Onion, garlic, and table salt. Meat flavorings  and tenderizers.  Choose: Fresh and dried herbs and spices. Low-sodium varieties of mustard and ketchup. Lemon juice. Tabasco sauce. Horseradish.    SAMPLE 1.5 GRAM SODIUM MEAL PLAN:   Breakfast / Sodium (mg)  1 cup low-fat milk / 143 mg  1 whole-wheat English muffin / 240 mg  1 tbs heart-healthy margarine / 153 mg  1 hard-boiled egg / 139 mg  1 small orange / 0 mg  Lunch / Sodium (mg)  1 cup raw carrots / 76 mg  2 tbs no salt added peanut butter / 5 mg  2 slices whole-wheat bread / 270 mg  1 tbs jelly / 6 mg   cup red grapes / 2 mg  Dinner / Sodium (mg)  1 cup  whole-wheat pasta / 2 mg  1 cup low-sodium tomato sauce / 73 mg  3 oz lean ground beef / 57 mg  1 small side salad (1 cup raw spinach leaves,  cup cucumber,  cup yellow bell pepper) with 1 tsp olive oil and 1 tsp red wine vinegar / 25 mg  Snack / Sodium (mg)  1 container low-fat vanilla yogurt / 107 mg  3 graham cracker squares / 127 mg  Nutrient Analysis  Calories: 1745  Protein: 75 g  Carbohydrate: 237 g  Fat: 57 g  Sodium: 1425 mg  Document Released: 07/28/2005 Document Revised: 04/09/2011 Document Reviewed: 10/29/2009  [facility] Patient Information 2012 [place], [place].    Nephrotic Syndrome  Nephrotic syndrome is a set of findings that show there is a problem with the kidneys. These findings include:  High levels of protein in the urine (proteinuria).  High blood pressure (hypertension).  Low levels of the protein albumin in the blood (hypoalbuminemia).  High levels of cholesterol (hyperlipidemia) and triglycerides (hypertriglyceridemia) in the blood.  Swelling (edema) of the face, abdomen, arms, and legs.  Nephrotic syndrome occurs when the kidneys' filters (glomeruli) are damaged. Glomeruli remove toxins and waste products from the bloodstream. As a result of damaged glomeruli, essential products such as proteins may also be removed from the bloodstream. Nephrotic syndrome results from the loss of proteins and other substances that the body needs. Nephrotic syndrome may increase your risk of further kidney damage and of health problems such as blood clots and infections. What are the causes? This condition may be caused by:  A kidney disease that damages the glomeruli, such as: ? Minimal change disease. ? Focal segmental glomerulosclerosis. ? Membranous nephropathy. ? Glomerulonephritis.  A condition or disease that affects other parts of the body (systemic), such as: ? Diabetes. ? Autoimmune diseases, such as lupus. ? Amyloidosis. ? Multiple myeloma. ? Some  types of cancers. ? An infection, such as hepatitis C.  Certain medicines, such as: ? Nonsteroidal anti-inflammatory drugs (NSAIDs). ? Some anticancer drugs.  In some cases, the cause may not be known. What are the signs or symptoms? Symptoms of this condition include:  Edema.  Foamy urine.  Unexplained weight gain.  Loss of appetite.  In some cases, there are no noticeable symptoms. How is this diagnosed? This condition is usually diagnosed with a dipstick urine test followed by a 24-hour urine test in which you collect all of the urine that you produce over a 24-hour period. If your test shows that you have this condition, more tests may be needed to find the cause. These may include blood, urine, imaging, or kidney biopsy tests. How is this treated? Treatment for this condition may include medicines to control symptoms or  to prevent complications from occurring. These medicines may:  Decrease inflammation in the kidneys.  Lower blood pressure.  Lower cholesterol.  Reduce the blood's ability to clot.  Help control edema.  Further treatment will depend on the cause of the condition. Your health care provider will discuss treatment options with you. Follow these instructions at home:  Follow diet instructions from your health care provider. This may include changes to help manage edema or hypertension, such as limiting your intake of salt or fluids.  Take over-the-counter and prescription medicines only as told by your health care provider. ? Do not take any new over-the-counter medicines or nutritional supplements unless approved by your health care provider. Many medicines can make this condition worse. Doses may need to be adjusted.  Keep all follow-up visits as told by your health care provider. This is important. Contact a health care provider if:  Your symptoms do not go away as expected or you develop new symptoms.  You continue to gain weight.  You have  increased swelling of the feet, ankles, or legs. Get help right away if:  You stop producing urine.  You have prolonged bleeding.  You have shortness of breath.  You have a severe headache.  You have severe weakness. This information is not intended to replace advice given to you by your health care provider. Make sure you discuss any questions you have with your health care provider. Document Released: 06/20/2004 Document Revised: 07/21/2016 Document Reviewed: 07/21/2016 Elsevier Interactive Patient Education  [employer].

## 2017-09-15 NOTE — Progress Notes (Signed)
Impression and Recommendations:    1. Positive for macroalbuminuria   2. Berger's disease: IgA Nephropathy    3. 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 Signficant h/o tobacco abuse (150pk/yr hx)   5. 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 Noncompliance with diet and medication regimen   7. GAD (generalized anxiety disorder)   8. COPD mixed type ([name])   9. Insomnia due to anxiety and fear   10. Psychophysiological insomnia     1. Positive for macroalbuminuria- -BMP Labs ordered today due to adding losartan last OV.   2. Berger's disease: IgA Nephropathy-Check BMP, control BP, adequate hydration, exercise daily to increase bloodflow to the kidneys, will monitor macroalbuminuria.   3. Nephrotic syndrome due to berger's disease -Compliant and tolerating well, denies all side effects.  4. Significant h/o tobacco abuse - continue stop smoking.  5. 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 -Pt is tolerating his medications well without complication. He is stable at this time. dose change: increase losartan to 50mg  to which pt agreed.  -goal keep BP consistently below 140/90.  6. Noncompliance with diet and medication regimen- Continue taking medications as prescribed.  7. GAD- Pt is tolerating his medications well and stable at this time. Continue taking meds as prescribed.  -Refill meds today  8. COPD- he is doing well. We will monitor him closely. If pt develops symptoms or worsens, we discussed a potential referral to pulmonology.   9. Insomnia due to anxiety and fear- -refill meds today.   10. Psychosocial insomnia- Warned pt about the side effects due to long-term use of this medication.  -Follow up in 4 months for follow up after dose increase in losartan and mood, or sooner if needed.  Orders Placed This Encounter  Procedures  . Basic metabolic panel    Meds ordered this encounter  Medications  . losartan  (COZAAR) 50 MG tablet    Sig: Take 1 tablet (50 mg total) by mouth daily.    Dispense:  90 tablet    Refill:  3  . citalopram (CELEXA) 20 MG tablet    Sig: Take 1 tablet daily.    Dispense:  90 tablet    Refill:  1  . zolpidem (AMBIEN CR) 12.5 MG CR tablet    Sig: Take 1 tablet (12.5 mg total) by mouth at bedtime as needed. for sleep    Dispense:  90 tablet    Refill:  1    Gross side effects, risk and benefits, and alternatives of medications and treatment plan in general discussed with patient.  Patient is aware that all medications have potential side effects and we are unable to predict every side effect or drug-drug interaction that may occur.   Patient will call with any questions prior to using medication if they have concerns.  Expresses verbal understanding and consents to current therapy and treatment regimen.  No barriers to understanding were identified.  Red flag symptoms and signs discussed in detail.  Patient expressed understanding regarding what to do in case of emergency\urgent symptoms  Please see AVS handed out to patient at the end of our visit for further patient instructions/ counseling done pertaining to today's office visit.   Return in about 4 months (around 01/13/2018) for Bp, -inc losartan and f/up mood, sleep, .    Note: This note was prepared with assistance of Dragon voice recognition software. Occasional wrong-word or sound-a-like substitutions may  have occurred due to the inherent limitations of voice recognition software.  This document serves as a record of services personally performed by [name], DO. It was created on her behalf by Mayer Masker, a trained medical scribe. The creation of this record is based on the scribe's personal observations and the provider's statements to them.   I have reviewed the above medical documentation for accuracy and completeness and I concur.  [name] 09/21/17 1:31  PM   --------------------------------------------------------------------------------------------------------------------------------------------------------------------------------------------------------------------------------------------    Subjective:     HPI: George [name] is a 67 y.o. male who presents to [facility] today for issues as discussed below. From last OV on 05-15-17, we increased vitamin D supplements and started losartan. He was initially taking 0.5 tablets a day, but increased this himself to 1 tablet a day, for a couple of months.  Vitamin D: He is still taking his supplements as prescribed.  Mood: He reports feeling well after being on his medications.  Sleep disturbances: He ran out of [place] a few weeks ago and tried to get his medications refilled but couldn't due to an issue with pharmacy.  COPD: No complications or new problems. He denies chest pains.  HTN HPI: He was taking 0.5 tablets a day, then increased this. He monitors his BP at home. 140/90s on average. -  His blood pressure has been controlled at home.   - Patient reports good compliance with blood pressure medications.  - Denies medication S-E   - Smoking Status noted   - He denies new onset of: chest pain, exercise intolerance, shortness of breath, dizziness, visual changes, headache, lower extremity swelling or claudication.   Last 3 blood pressure readings in our office are as follows: BP Readings from Last 3 Encounters:  09/15/17 138/87  05/15/17 122/78  04/29/17 (!) 160/90     Filed Weights   09/15/17 1043  Weight: 239 lb 1.6 oz (108.5 kg)    Wt Readings from Last 3 Encounters:  09/15/17 239 lb 1.6 oz (108.5 kg)  05/15/17 238 lb 1.6 oz (108 kg)  04/29/17 237 lb 3.2 oz (107.6 kg)   BP Readings from Last 3 Encounters:  09/15/17 138/87  05/15/17 122/78  04/29/17 (!) 160/90   Pulse Readings from Last 3 Encounters:  09/15/17 61  05/15/17 68   04/29/17 62   BMI Readings from Last 3 Encounters:  09/15/17 31.12 kg/m  05/15/17 30.99 kg/m  04/29/17 30.87 kg/m     Patient Care Team    Relationship Specialty Notifications Start End  [name], DO PCP - General Family Medicine  03/03/16   Lorretta Harp, MD Consulting Physician Cardiology  04/05/16   Estanislado Emms, MD Consulting Physician Nephrology  04/05/16   Evans Lance, MD Consulting Physician Clinical Cardiac Electrophysiology  04/05/16    Comment: his  EP  [facility]  Comer, Okey Regal, MD Consulting Physician Infectious Diseases  04/05/16    Comment: Hep C txmnt  Liliane Shi, PA-C Physician Assistant Cardiology  04/05/16    Comment: follows pt's fluid status---> L Ext edema  Dillingham, Loel Lofty, DO Attending Physician Plastic Surgery  04/05/16    Comment: Dr. Lyndee Leo Sanger   ( plastics and reconstuctive sx at [facility]) sees her in [place] at [facility];  she is who has diagnosed him with Berger's disease     Patient Active Problem List   Diagnosis Date Noted  . 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 Alcoholism /alcohol abuse- episodic binges 04/05/2016    Priority: High  . 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 Insomnia 04/05/2016    Priority: High  . Noncompliance with diet and medication regimen 04/05/2016    Priority: High  . Signficant h/o tobacco abuse (150pk/yr hx) 03/03/2016    Priority: High  . h/o Basal cell carcinoma of nose 03/03/2016    Priority: High  . GAD (generalized anxiety disorder) 03/03/2016    Priority: High  . h/o Hepatitis C infection 11/14/2015    Priority: High  . COPD mixed type ([place]) 10/12/2015    Priority: High  . AAA -   (f/up US 12/2018) 09/24/2015    Priority: High  . h/o Hypothyroidism 09/22/2015    Priority: High  . Berger's disease: IgA Nephropathy  10/12/2015    Priority: Medium  . Venous insufficiency of both lower extremities  09/21/2015    Priority: Medium  . H/O CHF 08/20/2015    Priority: Medium  . h/o SVT (supraventricular tachycardia) s/p Ablation 05/22/2015    Priority: Medium  . Obesity, Class I, BMI 30-34.9 04/29/2017    Priority: Low  . Elevated blood pressure reading- situational 04/29/2017    Priority: Low  . History of elevated glucose 04/29/2017    Priority: Low  . Colonoscopy refused 04/29/2017    Priority: Low  . Vitamin D deficiency 05/19/2016    Priority: Low  . Liver fibrosis 03/24/2016    Priority: Low  . Insomnia due to anxiety and fear 09/15/2017  . Positive for macroalbuminuria 05/15/2017  . HLD (hyperlipidemia) 05/15/2017  . Low level of high density lipoprotein (HDL) 05/15/2017  . Hypertriglyceridemia 05/15/2017    Past Medical history, Surgical history, Family history, Social history, Allergies and Medications have been entered into the medical record, reviewed and changed as needed.    Current Meds  Medication Sig  . aspirin EC 81 MG EC tablet Take 1 tablet (81 mg total) by mouth daily.  . Aspirin-Salicylamide-Caffeine (BC HEADACHE POWDER PO) Take 1 Package by mouth daily as needed (Body Aches).  . Cholecalciferol (VITAMIN D3) 5000 units CAPS Take 1 capsule by mouth daily.  . citalopram (CELEXA) 20 MG tablet Take 1 tablet daily.  Marland Kitchen zolpidem (AMBIEN CR) 12.5 MG CR tablet Take 1 tablet (12.5 mg total) by mouth at bedtime as needed. for sleep  . [DISCONTINUED] citalopram (CELEXA) 20 MG tablet Take 1 tablet daily.  . [DISCONTINUED] losartan (COZAAR) 25 MG tablet TAKE 0.5 TABLETS (12.5 MG TOTAL) BY MOUTH DAILY.  . [DISCONTINUED] zolpidem (AMBIEN CR) 12.5 MG CR tablet TAKE 1 TABLET AT BEDTIME AS NEEDED FOR SLEEP    Allergies:  No Known Allergies   Review of Systems:  A fourteen system review of systems was performed and found to be positive as per HPI.   Objective:   Blood pressure 138/87, pulse 61, height 6' 1.5" (1.867 m), weight 239 lb 1.6 oz (108.5 kg), SpO2 97  %. Body mass index is 31.12 kg/m. General:  Well Developed, well nourished, appropriate for stated age.  Neuro:  Alert and oriented,  extra-ocular muscles intact  HEENT:  Normocephalic, atraumatic, neck supple, no carotid bruits appreciated  Skin:  no gross rash, warm, pink. Cardiac:  RRR, S1 S2 Respiratory:  ECTA B/L and A/P, Not using accessory muscles, speaking in full sentences- unlabored. Slight increased exhalation phase but otherwise lungs are normal Vascular:  Ext warm, no  cyanosis apprec.; cap RF less 2 sec. Psych:  No HI/SI, judgement and insight good, Euthymic mood. Full Affect.

## 2017-09-16 LAB — BASIC METABOLIC PANEL
BUN / CREAT RATIO: 19 (ref 10–24)
BUN: 23 mg/dL (ref 8–27)
CO2: 20 mmol/L (ref 20–29)
CREATININE: 1.22 mg/dL (ref 0.76–1.27)
Calcium: 9.8 mg/dL (ref 8.6–10.2)
Chloride: 100 mmol/L (ref 96–106)
GFR calc non Af Amer: 61 mL/min/{1.73_m2} (ref >59)
GFR, EST AFRICAN AMERICAN: 70 mL/min/{1.73_m2} (ref >59)
GLUCOSE: 89 mg/dL (ref 65–99)
Potassium: 5.1 mmol/L (ref 3.5–5.2)
SODIUM: 137 mmol/L (ref 134–144)

## 2017-11-02 ENCOUNTER — Telehealth: Payer: Self-pay | Admitting: Family Medicine

## 2017-11-02 ENCOUNTER — Other Ambulatory Visit: Payer: Self-pay | Admitting: Family Medicine

## 2017-11-02 DIAGNOSIS — F411 Generalized anxiety disorder: Secondary | ICD-10-CM

## 2017-11-02 NOTE — Telephone Encounter (Signed)
Left message advising on 09/15/17 a 90 day with 1 refill was sent to pharmacy. It will be a different Rx number. Patient will need to speak to a pharmacy tech to have the prescription filled.

## 2017-11-02 NOTE — Telephone Encounter (Signed)
Contacted pharmacy and the prescription is ready for pick up. Patient advised.

## 2017-11-02 NOTE — Telephone Encounter (Signed)
Pt called states he call : [facility]      [id number] [address], [address] [phone] (Phone) [phone] (Fax)   For Rx refill on:   citalopram (CELEXA) 20 MG tablet [[id number]]   Order Details  Dose, Route, Frequency: As Directed   Indications of Use: Depression, Panic Disorder, Social Anxiety Disorder  Dispense Quantity: 90 tablet Refills: 1 Fills remaining: --        Sig: Take 1 tablet daily.          Patient was told no refill on file ( the on 09/15/17 they filled his Rx for #45 pills--I advised provider Rx was for a 90 dys Rx (with 1 refill) --- Pt states he was taking only 1/2 tablet for at least 30dys,  then spoke w/ Dr. Jenetta Downer who instructed him to increase to (1) full tablet daily--- Pt is out of meds & is request Medical Assistant [facility] to see why his Rx was not filled out to  [place] orders--- Patient says needs medication -- Please have a new Rx called in if needed and he will go to pick up.  ---glh

## 2017-11-02 NOTE — Telephone Encounter (Signed)
I called the pharmacy and they have had the prescription ready since lunch time. Patient advised.

## 2017-11-02 NOTE — Telephone Encounter (Signed)
Patient is requesting a refill of his Celexa, currently only has 1 pill remaining. If approved [facility] [address]

## 2017-11-12 ENCOUNTER — Other Ambulatory Visit: Payer: Self-pay | Admitting: Family Medicine

## 2017-11-12 DIAGNOSIS — N049 Nephrotic syndrome with unspecified morphologic changes: Secondary | ICD-10-CM

## 2017-11-12 DIAGNOSIS — R809 Proteinuria, unspecified: Secondary | ICD-10-CM

## 2018-01-13 ENCOUNTER — Ambulatory Visit (INDEPENDENT_AMBULATORY_CARE_PROVIDER_SITE_OTHER): Payer: Medicare Other | Admitting: Family Medicine

## 2018-01-13 ENCOUNTER — Encounter: Payer: Self-pay | Admitting: Family Medicine

## 2018-01-13 VITALS — BP 112/78 | HR 76 | Ht 74.0 in | Wt 238.3 lb

## 2018-01-13 DIAGNOSIS — J449 Chronic obstructive pulmonary disease, unspecified: Secondary | ICD-10-CM

## 2018-01-13 DIAGNOSIS — R809 Proteinuria, unspecified: Secondary | ICD-10-CM | POA: Diagnosis not present

## 2018-01-13 DIAGNOSIS — F1990 Other psychoactive substance use, unspecified, uncomplicated: Secondary | ICD-10-CM

## 2018-01-13 DIAGNOSIS — F5105 Insomnia due to other mental disorder: Secondary | ICD-10-CM | POA: Diagnosis not present

## 2018-01-13 DIAGNOSIS — E781 Pure hyperglyceridemia: Secondary | ICD-10-CM

## 2018-01-13 DIAGNOSIS — E782 Mixed hyperlipidemia: Secondary | ICD-10-CM

## 2018-01-13 DIAGNOSIS — F411 Generalized anxiety disorder: Secondary | ICD-10-CM | POA: Diagnosis not present

## 2018-01-13 DIAGNOSIS — I151 Hypertension secondary to other renal disorders: Secondary | ICD-10-CM | POA: Diagnosis not present

## 2018-01-13 DIAGNOSIS — E786 Lipoprotein deficiency: Secondary | ICD-10-CM | POA: Diagnosis not present

## 2018-01-13 DIAGNOSIS — M79604 Pain in right leg: Secondary | ICD-10-CM

## 2018-01-13 DIAGNOSIS — N049 Nephrotic syndrome with unspecified morphologic changes: Secondary | ICD-10-CM

## 2018-01-13 DIAGNOSIS — I872 Venous insufficiency (chronic) (peripheral): Secondary | ICD-10-CM | POA: Diagnosis not present

## 2018-01-13 DIAGNOSIS — F5104 Psychophysiologic insomnia: Secondary | ICD-10-CM

## 2018-01-13 DIAGNOSIS — F409 Phobic anxiety disorder, unspecified: Secondary | ICD-10-CM

## 2018-01-13 MED ORDER — LOSARTAN POTASSIUM 50 MG PO TABS: 50.0000 mg | ORAL_TABLET | Freq: Every day | ORAL | 3 refills | Status: DC

## 2018-01-13 MED ORDER — CITALOPRAM HYDROBROMIDE 20 MG PO TABS: ORAL_TABLET | ORAL | 3 refills | Status: DC

## 2018-01-13 MED ORDER — HYDROCODONE-ACETAMINOPHEN 10-325 MG PO TABS: ORAL_TABLET | ORAL | 0 refills | Status: DC

## 2018-01-13 MED ORDER — ZOLPIDEM TARTRATE ER 12.5 MG PO TBCR: 12.5000 mg | EXTENDED_RELEASE_TABLET | Freq: Every evening | ORAL | 3 refills | Status: DC | PRN
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
## 2018-01-13 NOTE — Patient Instructions (Addendum)
-  The maximum dose of ibuprofen is 600 mg every 6-8 hours.  Then I gave you Vicodin as needed for severe pain.  Please take 1/2 to 1 tablet every 6 hours as needed max dose would be 2 tablets but please try to use lower doses for pain management.  -Please get your pain medicines from the orthopedist who will be treating your leg and hip pain in the future.  I will not be able to refill this medicine again for you for this chronic condition

## 2018-01-14 LAB — COMPREHENSIVE METABOLIC PANEL
ALBUMIN: 5.3 g/dL — AB (ref 3.6–4.8)
ALT: 14 IU/L (ref 0–44)
AST: 12 IU/L (ref 0–40)
Albumin/Globulin Ratio: 2 (ref 1.2–2.2)
Alkaline Phosphatase: 59 IU/L (ref 39–117)
BUN / CREAT RATIO: 17 (ref 10–24)
BUN: 19 mg/dL (ref 8–27)
Bilirubin Total: 0.3 mg/dL (ref 0.0–1.2)
CO2: 23 mmol/L (ref 20–29)
Calcium: 10.1 mg/dL (ref 8.6–10.2)
Chloride: 100 mmol/L (ref 96–106)
Creatinine, Ser: 1.15 mg/dL (ref 0.76–1.27)
GFR, EST AFRICAN AMERICAN: 75 mL/min/{1.73_m2} (ref >59)
GFR, EST NON AFRICAN AMERICAN: 65 mL/min/{1.73_m2} (ref >59)
GLOBULIN, TOTAL: 2.7 g/dL (ref 1.5–4.5)
GLUCOSE: 89 mg/dL (ref 65–99)
Potassium: 5.1 mmol/L (ref 3.5–5.2)
Sodium: 142 mmol/L (ref 134–144)
TOTAL PROTEIN: 8 g/dL (ref 6.0–8.5)

## 2018-01-14 LAB — MICROALBUMIN / CREATININE URINE RATIO
Creatinine, Urine: 135.7 mg/dL
Microalb/Creat Ratio: 92.6 mg/g creat — ABNORMAL HIGH (ref 0.0–30.0)
Microalbumin, Urine: 125.7 ug/mL

## 2018-01-17 DIAGNOSIS — F1990 Other psychoactive substance use, unspecified, uncomplicated: Secondary | ICD-10-CM | POA: Insufficient documentation

## 2018-01-17 DIAGNOSIS — M79604 Pain in right leg: Secondary | ICD-10-CM | POA: Insufficient documentation

## 2018-01-20 DIAGNOSIS — M79604 Pain in right leg: Secondary | ICD-10-CM | POA: Diagnosis not present

## 2018-01-20 DIAGNOSIS — M79651 Pain in right thigh: Secondary | ICD-10-CM | POA: Diagnosis not present

## 2018-05-18 ENCOUNTER — Ambulatory Visit (INDEPENDENT_AMBULATORY_CARE_PROVIDER_SITE_OTHER): Payer: Medicare Other | Admitting: Family Medicine

## 2018-05-18 ENCOUNTER — Encounter: Payer: Self-pay | Admitting: Family Medicine

## 2018-05-18 VITALS — BP 138/86 | HR 69 | Ht 74.0 in | Wt 245.2 lb

## 2018-05-18 DIAGNOSIS — R809 Proteinuria, unspecified: Secondary | ICD-10-CM | POA: Diagnosis not present

## 2018-05-18 DIAGNOSIS — F411 Generalized anxiety disorder: Secondary | ICD-10-CM | POA: Diagnosis not present

## 2018-05-18 DIAGNOSIS — I151 Hypertension secondary to other renal disorders: Secondary | ICD-10-CM | POA: Diagnosis not present

## 2018-05-18 DIAGNOSIS — D492 Neoplasm of unspecified behavior of bone, soft tissue, and skin: Secondary | ICD-10-CM | POA: Insufficient documentation

## 2018-05-18 DIAGNOSIS — C44310 Basal cell carcinoma of skin of unspecified parts of face: Secondary | ICD-10-CM

## 2018-05-18 DIAGNOSIS — E669 Obesity, unspecified: Secondary | ICD-10-CM

## 2018-05-18 DIAGNOSIS — N049 Nephrotic syndrome with unspecified morphologic changes: Secondary | ICD-10-CM | POA: Diagnosis not present

## 2018-05-18 DIAGNOSIS — F5104 Psychophysiologic insomnia: Secondary | ICD-10-CM | POA: Diagnosis not present

## 2018-05-18 NOTE — Patient Instructions (Addendum)
Obtain TSH, free T4, fasting lipid profile, A1c, vitamin D, CMP prior to next OV 3-5 days prior.  Thank you for putting [place] future labs in for me Melissa.    Nephrotic Syndrome Nephrotic syndrome is a set of findings that show there is a problem with the kidneys. These findings include:  High levels of protein in the urine (proteinuria).  High blood pressure (hypertension).  Low levels of the protein albumin in the blood (hypoalbuminemia).  High levels of cholesterol (hyperlipidemia) and triglycerides (hypertriglyceridemia) in the blood.  Swelling (edema) of the face, abdomen, arms, and legs.  Nephrotic syndrome occurs when the kidneys' filters (glomeruli) are damaged. Glomeruli remove toxins and waste products from the bloodstream. As a result of damaged glomeruli, essential products such as proteins may also be removed from the bloodstream. Nephrotic syndrome results from the loss of proteins and other substances that the body needs. Nephrotic syndrome may increase your risk of further kidney damage and of health problems such as blood clots and infections. What are the causes? This condition may be caused by:  A kidney disease that damages the glomeruli, such as: ? Minimal change disease. ? Focal segmental glomerulosclerosis. ? Membranous nephropathy. ? Glomerulonephritis.  A condition or disease that affects other parts of the body (systemic), such as: ? Diabetes. ? Autoimmune diseases, such as lupus. ? Amyloidosis. ? Multiple myeloma. ? Some types of cancers. ? An infection, such as hepatitis C.  Certain medicines, such as: ? Nonsteroidal anti-inflammatory drugs (NSAIDs). ? Some anticancer drugs.  In some cases, the cause may not be known. What are the signs or symptoms? Symptoms of this condition include:  Edema.  Foamy urine.  Unexplained weight gain.  Loss of appetite.  In some cases, there are no noticeable symptoms. How is this diagnosed? This condition  is usually diagnosed with a dipstick urine test followed by a 24-hour urine test in which you collect all of the urine that you produce over a 24-hour period. If your test shows that you have this condition, more tests may be needed to find the cause. These may include blood, urine, imaging, or kidney biopsy tests. How is this treated? Treatment for this condition may include medicines to control symptoms or to prevent complications from occurring. These medicines may:  Decrease inflammation in the kidneys.  Lower blood pressure.  Lower cholesterol.  Reduce the blood's ability to clot.  Help control edema.  Further treatment will depend on the cause of the condition. Your health care provider will discuss treatment options with you. Follow these instructions at home:  Follow diet instructions from your health care provider. This may include changes to help manage edema or hypertension, such as limiting your intake of salt or fluids.  Take over-the-counter and prescription medicines only as told by your health care provider. ? Do not take any new over-the-counter medicines or nutritional supplements unless approved by your health care provider. Many medicines can make this condition worse. Doses may need to be adjusted.  Keep all follow-up visits as told by your health care provider. This is important. Contact a health care provider if:  Your symptoms do not go away as expected or you develop new symptoms.  You continue to gain weight.  You have increased swelling of the feet, ankles, or legs. Get help right away if:  You stop producing urine.  You have prolonged bleeding.  You have shortness of breath.  You have a severe headache.  You have severe weakness. This information  is not intended to replace advice given to you by your health care provider. Make sure you discuss any questions you have with your health care provider. Document Released: 06/20/2004 Document Revised:  07/21/2016 Document Reviewed: 07/21/2016 Elsevier Interactive Patient Education  2018 [place] out the DASH diet = 1.5 Gram Low Sodium Diet   A 1.5 gram sodium diet restricts the amount of sodium in the diet to no more than 1.5 g or 1500 mg daily.  The American Heart Association recommends Americans over the age of 77 to consume no more than 1500 mg of sodium each day to reduce the risk of developing high blood pressure.  Research also shows that limiting sodium may reduce heart attack and stroke risk.  Many foods contain sodium for flavor and sometimes as a preservative.  When the amount of sodium in a diet needs to be low, it is important to know what to look for when choosing foods and drinks.  The following includes some information and guidelines to help make it easier for you to adapt to a low sodium diet.    QUICK TIPS  Do not add salt to food.  Avoid convenience items and fast food.  Choose unsalted snack foods.  Buy lower sodium products, often labeled as "lower sodium" or "no salt added."  Check food labels to learn how much sodium is in 1 serving.  When eating at a restaurant, ask that your food be prepared with less salt or none, if possible.    READING FOOD LABELS FOR SODIUM INFORMATION  The nutrition facts label is a good place to find how much sodium is in foods. Look for products with no more than 400 mg of sodium per serving.  Remember that 1.5 g = 1500 mg.  The food label may also list foods as:  Sodium-free: Less than 5 mg in a serving.  Very low sodium: 35 mg or less in a serving.  Low-sodium: 140 mg or less in a serving.  Light in sodium: 50% less sodium in a serving. For example, if a food that usually has 300 mg of sodium is changed to become light in sodium, it will have 150 mg of sodium.  Reduced sodium: 25% less sodium in a serving. For example, if a food that usually has 400 mg of sodium is changed to reduced sodium, it will have 300 mg of sodium.     CHOOSING FOODS  Grains  Avoid: Salted crackers and snack items. Some cereals, including instant hot cereals. Bread stuffing and biscuit mixes. Seasoned rice or pasta mixes.  Choose: Unsalted snack items. Low-sodium cereals, oats, puffed wheat and rice, shredded wheat. English muffins and bread. Pasta.  Meats  Avoid: Salted, canned, smoked, spiced, pickled meats, including fish and poultry. Bacon, ham, sausage, cold cuts, hot dogs, anchovies.  Choose: Low-sodium canned tuna and salmon. Fresh or frozen meat, poultry, and fish.  Dairy  Avoid: Processed cheese and spreads. Cottage cheese. Buttermilk and condensed milk. Regular cheese.  Choose: Milk. Low-sodium cottage cheese. Yogurt. Sour cream. Low-sodium cheese.  Fruits and Vegetables  Avoid: Regular canned vegetables. Regular canned tomato sauce and paste. Frozen vegetables in sauces. Olives. Angie Fava. Relishes. Sauerkraut.  Choose: Low-sodium canned vegetables. Low-sodium tomato sauce and paste. Frozen or fresh vegetables. Fresh and frozen fruit.  Condiments  Avoid: Canned and packaged gravies. Worcestershire sauce. Tartar sauce. Barbecue sauce. Soy sauce. Steak sauce. Ketchup. Onion, garlic, and table salt. Meat flavorings and tenderizers.  Choose: Fresh  and dried herbs and spices. Low-sodium varieties of mustard and ketchup. Lemon juice. Tabasco sauce. Horseradish.    SAMPLE 1.5 GRAM SODIUM MEAL PLAN:   Breakfast / Sodium (mg)  1 cup low-fat milk / 143 mg  1 whole-wheat English muffin / 240 mg  1 tbs heart-healthy margarine / 153 mg  1 hard-boiled egg / 139 mg  1 small orange / 0 mg  Lunch / Sodium (mg)  1 cup raw carrots / 76 mg  2 tbs no salt added peanut butter / 5 mg  2 slices whole-wheat bread / 270 mg  1 tbs jelly / 6 mg   cup red grapes / 2 mg  Dinner / Sodium (mg)  1 cup whole-wheat pasta / 2 mg  1 cup low-sodium tomato sauce / 73 mg  3 oz lean ground beef / 57 mg  1 small side salad (1 cup raw spinach leaves,  cup  cucumber,  cup yellow bell pepper) with 1 tsp olive oil and 1 tsp red wine vinegar / 25 mg  Snack / Sodium (mg)  1 container low-fat vanilla yogurt / 107 mg  3 graham cracker squares / 127 mg  Nutrient Analysis  Calories: 1745  Protein: 75 g  Carbohydrate: 237 g  Fat: 57 g  Sodium: 1425 mg  Document Released: 07/28/2005 Document Revised: 04/09/2011 Document Reviewed: 10/29/2009  [facility] Patient Information 2012 [place], [place].      Guidelines for a Low Cholesterol, Low Saturated Fat Diet   Fats - Limit total intake of fats and oils. - Avoid butter, stick margarine, shortening, lard, palm and coconut oils. - Limit mayonnaise, salad dressings, gravies and sauces, unless they are homemade with low-fat ingredients. - Limit chocolate. - Choose low-fat and nonfat products, such as low-fat mayonnaise, low-fat or non-hydrogenated peanut butter, low-fat or fat-free salad dressings and nonfat gravy. - Use vegetable oil, such as canola or olive oil. - Look for margarine that does not contain trans fatty acids. - Use nuts in moderate amounts. - Read ingredient labels carefully to determine both amount and type of fat present in foods. Limit saturated and trans fats! - Avoid high-fat processed and convenience foods.  Meats and Meat Alternatives - Choose fish, chicken, [place] and lean meats. - Use dried beans, peas, lentils and tofu. - Limit egg yolks to three to four per week. - If you eat red meat, limit to no more than three servings per week and choose loin or round cuts. - Avoid fatty meats, such as bacon, sausage, franks, luncheon meats and ribs. - Avoid all organ meats, including liver.  Dairy - Choose nonfat or low-fat milk, yogurt and cottage cheese. - Most cheeses are high in fat. Choose cheeses made from non-fat milk, such as mozzarella and ricotta cheese. - Choose light or fat-free cream cheese and sour cream. - Avoid cream and sauces made with cream.  Fruits and  Vegetables - Eat a wide variety of fruits and vegetables. - Use lemon juice, vinegar or "mist" olive oil on vegetables. - Avoid adding sauces, fat or oil to vegetables.  Breads, Cereals and Grains - Choose whole-grain breads, cereals, pastas and rice. - Avoid high-fat snack foods, such as granola, cookies, pies, pastries, doughnuts and croissants.  Cooking Tips - Avoid deep fried foods. - Trim visible fat off meats and remove skin from poultry before cooking. - Bake, broil, boil, poach or roast poultry, fish and lean meats. - Drain and discard fat that drains out of meat as you  cook it. - Add little or no fat to foods. - Use vegetable oil sprays to grease pans for cooking or baking. - Steam vegetables. - Use herbs or no-oil marinades to flavor foods.

## 2018-05-18 NOTE — Progress Notes (Signed)
Impression and Recommendations:    1. 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 Positive for macroalbuminuria   3. 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 Obesity, Class I, BMI 30-34.9   5. GAD (generalized anxiety disorder)   6. Psychophysiological insomnia   7. h/o Basal cell carcinoma of nose   8. Abnormal skin growth- R inner nasal lacrimal fold    1. 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  2. 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  - Patient has been noncompliant with blood pressure medication. - Recommended compliance with treatment plan today.  See med list below. - Patient otherwise tolerating meds well without complication.  Denies S-E  - Lifestyle changes such as dash diet and engaging in a regular exercise program discussed with patient.  Educational handouts provided at patient's request.  - Ambulatory BP monitoring encouraged. Keep log and bring in next OV  3. GAD (generalized anxiety disorder) - Sx remain controlled at this time on Celexa. - Continue treatment as prescribed.  See med list below. - Patient tolerating meds well without complication.  Denies S-E  4. Psychophysiological insomnia - Relieved on Ambien - Sx remain controlled at this time on Ambien. - Continue treatment as prescribed.  See med list below. - Patient tolerating meds well without complication.  Denies S-E  5. Dermatological Concerns - h/o Basal Cell Carcinoma of Nose - Referral to specialist provided to patient today. - Reviewed and educated patient on importance of following up with dermatology.  6. BMI Counseling- Obesity, Class I, BMI 30-34.9  Explained to patient what BMI refers to, and what it means medically.    Told patient to think about it as a "medical risk  stratification measurement" and how increasing BMI is associated with increasing risk/ or worsening state of various diseases such as hypertension, hyperlipidemia, diabetes, premature OA, depression etc.  American Heart Association guidelines for healthy diet, basically Mediterranean diet, and exercise guidelines of 30 minutes 5 days per week or more discussed in detail.  Health counseling performed.  All questions answered.  7. Lifestyle & Preventative Health Maintenance - Advised patient to continue working toward exercising to improve overall mental, physical, and emotional health.    - Encouraged patient to engage in daily physical activity, especially a formal exercise routine.  Recommended that the patient eventually strive for at least 150 minutes of moderate cardiovascular activity per week according to guidelines established by the [facility].   - Healthy dietary habits encouraged, including low-carb, and high amounts of lean protein in diet.   - Patient should also consume adequate amounts of water - half of body weight in oz of water per day.   Education and routine counseling performed. Handouts provided.  8. Follow Up - Re-check fasting lab work as recommended. - Otherwise, continue to return for CPE and chronic follow-up as scheduled.   - Patient knows to call in if he desires to come in sooner for acute concerns.   Orders Placed This Encounter  Procedures  . Ambulatory referral to Dermatology    The patient was counseled, risk factors were discussed, anticipatory guidance given.  Gross side effects, risk and benefits, and alternatives of medications discussed with patient.  Patient is aware that all medications have potential side effects and we are unable to predict every side  effect or drug-drug interaction that may occur.  Expresses verbal understanding and consents to current therapy plan and treatment regimen.  Return for f/up for lab review, BRING in BP log, FBW 3-5d  prior.  Please see AVS handed out to patient at the end of our visit for further patient instructions/ counseling done pertaining to today's office visit.    Note:  This document was prepared using Dragon voice recognition software and may include unintentional dictation errors.  This document serves as a record of services personally performed by [name], DO. It was created on her behalf by Toni Amend, a trained medical scribe. The creation of this record is based on the scribe's personal observations and the provider's statements to them.   I have reviewed the above medical documentation for accuracy and completeness and I concur.  [name] 05/19/18 1:19 PM    Subjective:    HPI: George [name] is a 67 y.o. male who presents to [place] [facility] today for follow up of [place].    Patient states that he's generally been feeling well. He has gained weight since January 13 2018, increasing from 238 lbs to 245 lbs.  Orthopedic Follow-Up Patient was referred to Orthopedics last appointment.  Patient states that on this referral, he was given anti-inflammatory tablets (diclofenac) and notes that within about 2-3 days these started working.  Nephrology Follow-Up Patient states that it has been a couple of years since he followed up with his nephrologist, and he has no desire to resume following up with nephrology.  Dermatological Concerns Saw Dr. Tobias Alexander in the past.  Had surgery three years ago on site near right eye.  Was supposed to see dermatology again but never returned.  Went into the hospital for heart trouble and hasn't touched base with her since.  States that he has two areas of concern he would like to have checked out, by his right eye and on his neck.  Mood Patient states that his mood is good. Continues on Celexa as prescribed.  Insomnia Continues taking Ambien for insomnia. States he's sleeping well as long as he  takes his Ambien. Notes "if I don't take it, I have trouble."  HTN: -  His blood pressure has not been controlled at home.   States it's been running about 140/88 at home.  Patient is checking it.   - Patient reports good compliance with blood pressure medications.  He's taking Cozaar, but not as prescribed.  States sometimes he takes one and forgets to take the other one.  States "I'm bad about directions."  - Denies medication S-E   - Smoking Status noted   - He denies new onset of: chest pain, exercise intolerance, shortness of breath, dizziness, visual changes, headache, lower extremity swelling or claudication.   Last 3 blood pressure readings in our office are as follows: BP Readings from Last 3 Encounters:  05/18/18 138/86  01/13/18 112/78  09/15/17 138/87    Pulse Readings from Last 3 Encounters:  05/18/18 69  01/13/18 76  09/15/17 61    Filed Weights   05/18/18 0845  Weight: 245 lb 3.2 oz (111.2 kg)    Depression screen [facility] 2/9 05/18/2018 01/13/2018 09/15/2017 05/15/2017 04/29/2017  Decreased Interest 0 0 0 0 0  Down, Depressed, Hopeless 0 0 0 0 0  PHQ - 2 Score 0 0 0 0 0  Altered sleeping 0 0 0 0 -  Tired, decreased energy 0 0 0  0 -  Change in appetite 0 0 0 0 -  Feeling bad or failure about yourself  0 0 0 0 -  Trouble concentrating 0 0 0 0 -  Moving slowly or fidgety/restless 0 0 0 0 -  Suicidal thoughts 0 0 0 0 -  PHQ-9 Score 0 0 0 0 -  Difficult doing work/chores Not difficult at all Not difficult at all Not difficult at all Not difficult at all -   GAD 7 : Generalized Anxiety Score 05/18/2018 12/23/2016 04/23/2016  Nervous, Anxious, on Edge 0 2 1  Control/stop worrying 0 2 1  Worry too much - different things 0 2 1  Trouble relaxing 0 2 2  Restless 0 0 2  Easily annoyed or irritable 0 0 1  Afraid - awful might happen 0 0 1  Total GAD 7 Score 0 8 9  Anxiety Difficulty Not difficult at all Not difficult at all Somewhat difficult     Patient Care Team      Relationship Specialty Notifications Start End  [name], DO PCP - General Family Medicine  03/03/16   Lorretta Harp, MD Consulting Physician Cardiology  04/05/16   Estanislado Emms, MD Consulting Physician Nephrology  04/05/16   Evans Lance, MD Consulting Physician Clinical Cardiac Electrophysiology  04/05/16    Comment: his  EP  [facility]  Comer, Okey Regal, MD Consulting Physician Infectious Diseases  04/05/16    Comment: Hep C txmnt  Liliane Shi, PA-C Physician Assistant Cardiology  04/05/16    Comment: follows pt's fluid status---> L Ext edema  Dillingham, Loel Lofty, DO Attending Physician Plastic Surgery  04/05/16    Comment: Dr. Lyndee Leo Sanger   ( plastics and reconstuctive sx at [facility]) sees her in [place] at [facility];  she is who has diagnosed him with Berger's disease     Lab Results  Component Value Date   CREATININE 1.15 01/13/2018   BUN 19 01/13/2018   NA 142 01/13/2018   K 5.1 01/13/2018   CL 100 01/13/2018   CO2 23 01/13/2018    Lab Results  Component Value Date   CHOL 198 04/29/2017   CHOL 160 05/07/2016   CHOL 187 08/29/2015    Lab Results  Component Value Date   HDL 36 (L) 04/29/2017   HDL 40 05/07/2016   HDL 41.80 08/29/2015    Lab Results  Component Value Date   LDLCALC 129 (H) 04/29/2017   LDLCALC 99 05/07/2016   LDLCALC 116 (H) 08/29/2015    Lab Results  Component Value Date   TRIG 164 (H) 04/29/2017   TRIG 106 05/07/2016   TRIG 146.0 08/29/2015    Lab Results  Component Value Date   CHOLHDL 5.5 (H) 04/29/2017   CHOLHDL 4.0 05/07/2016   CHOLHDL 4 08/29/2015    No results found for: LDLDIRECT ===================================================================   Patient Active Problem List   Diagnosis Date Noted  . 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 Alcoholism /alcohol abuse- episodic binges 04/05/2016    Priority: High  . 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 Insomnia 04/05/2016    Priority: High  . Noncompliance with diet and medication regimen 04/05/2016    Priority: High  . Signficant h/o tobacco abuse (150pk/yr hx) 03/03/2016    Priority: High  . h/o Basal cell carcinoma of nose 03/03/2016    Priority: High  . GAD (  generalized anxiety disorder) 03/03/2016    Priority: High  . h/o Hepatitis C infection 11/14/2015    Priority: High  . COPD mixed type ([place]) 10/12/2015    Priority: High  . AAA -   (f/up US 12/2018) 09/24/2015    Priority: High  . h/o Hypothyroidism 09/22/2015    Priority: High  . Berger's disease: IgA Nephropathy  10/12/2015    Priority: Medium  . Venous insufficiency of both lower extremities 09/21/2015    Priority: Medium  . H/O CHF 08/20/2015    Priority: Medium  . h/o SVT (supraventricular tachycardia) s/p Ablation 05/22/2015    Priority: Medium  . Obesity, Class I, BMI 30-34.9 04/29/2017    Priority: Low  . Elevated blood pressure reading- situational 04/29/2017    Priority: Low  . History of elevated glucose 04/29/2017    Priority: Low  . Colonoscopy refused 04/29/2017    Priority: Low  . Vitamin D deficiency 05/19/2016    Priority: Low  . Liver fibrosis 03/24/2016    Priority: Low  . Abnormal skin growth- R inner nasal lacrimal fold 05/18/2018  . Excessive use of nonsteroidal anti-inflammatory drug (NSAID) 01/17/2018  . Chronic Right leg pain-  pt w/ history of ORIF femur 30-40 years ago increasing pain for 1 year, terrible pain 3 months 01/17/2018  . Insomnia due to anxiety and fear 09/15/2017  . Positive for macroalbuminuria 05/15/2017  . HLD (hyperlipidemia) 05/15/2017  . Low level of high density lipoprotein (HDL) 05/15/2017  . Hypertriglyceridemia 05/15/2017     Past Medical History:  Diagnosis Date  . AAA (abdominal aortic aneurysm) ([place])    a. [place] 09/2015 - 3cm distal AAA. //  b. [place] 5/17: 3.1 cm   . Basal cell carcinoma of right side of nose   . Berger's disease   .  CKD (chronic kidney disease) 09/2015  . GERD (gastroesophageal reflux disease)   . Nephrotic syndrome   . Panic attacks   . SVT (supraventricular tachycardia) ([place]) 05/22/2015     Past Surgical History:  Procedure Laterality Date  . BASAL CELL CARCINOMA EXCISION Right ~ 06/2015   "side of my nose"  . ELECTROPHYSIOLOGIC STUDY N/A 09/10/2015   Procedure: SVT Ablation;  Surgeon: Evans Lance, MD;  Location: [place] [facility];  Service: Cardiovascular;  Laterality: N/A;  . FEMUR FRACTURE SURGERY Right 1970   "had plate & pin put in"  . FEMUR HARDWARE REMOVAL Right 1971   "removed plate; left pin in  . FRACTURE SURGERY    . KNEE ARTHROSCOPY Right ~ 1985  . TONSILLECTOMY AND ADENOIDECTOMY  ~ 1955     Family History  Problem Relation Age of Onset  . Heart disease Mother        Before age 88  . Heart attack Mother   . Cancer Mother        lung and colon  . Stroke Mother   . Heart failure Father   . Heart disease Father        After age 3  . Heart attack Father   . Hyperlipidemia Father   . Healthy Maternal Grandmother   . Healthy Maternal Grandfather   . Diabetes Paternal Grandmother   . Healthy Paternal Grandfather   . Hypertension Neg Hx      Social History   Substance and Sexual Activity  Drug Use No  ,  Social History   Substance and Sexual Activity  Alcohol Use Yes  . Alcohol/week: 3.0 - 4.0 standard drinks  .  Types: 3 - 4 Shots of liquor per week  ,  Social History   Tobacco Use  Smoking Status Former Smoker  . Packs/day: 2.00  . Years: 50.00  . Pack years: 100.00  . Types: Cigarettes  . Last attempt to quit: 01/24/2015  . Years since quitting: 3.3  Smokeless Tobacco Never Used  ,    Current Outpatient Medications on File Prior to Visit  Medication Sig Dispense Refill  . acetaminophen (TYLENOL) 500 MG tablet Take 500 mg by mouth every 6 (six) hours as needed.    Marland Kitchen aspirin EC 81 MG EC tablet Take 1 tablet (81 mg total) by mouth daily.    .  Cholecalciferol (VITAMIN D3) 5000 units CAPS Take 1 capsule by mouth daily.    . citalopram (CELEXA) 20 MG tablet Take 1 tablet daily. 90 tablet 3  . diclofenac (VOLTAREN) 75 MG EC tablet Take 75 mg by mouth 2 (two) times daily.  0  . HYDROcodone-acetaminophen (NORCO) 10-325 MG tablet 1 to 2 tab po q 6 hr prn SEVERE pain 40 tablet 0  . losartan (COZAAR) 50 MG tablet Take 1 tablet (50 mg total) by mouth daily. 90 tablet 3  . zolpidem (AMBIEN CR) 12.5 MG CR tablet Take 1 tablet (12.5 mg total) by mouth at bedtime as needed. for sleep 90 tablet 3   No current facility-administered medications on file prior to visit.      No Known Allergies   Review of Systems:   General:  Denies fever, chills Optho/Auditory:   Denies visual changes, blurred vision Respiratory:   Denies SOB, cough, wheeze, DIB  Cardiovascular:   Denies chest pain, palpitations, painful respirations Gastrointestinal:   Denies nausea, vomiting, diarrhea.  Endocrine:     Denies new hot or cold intolerance Musculoskeletal:  Denies joint swelling, gait issues, or new unexplained myalgias/ arthralgias Skin:  Denies rash, suspicious lesions  Neurological:    Denies dizziness, unexplained weakness, numbness  Psychiatric/Behavioral:   Denies mood changes  Objective:    Blood pressure 138/86, pulse 69, height 6\' 2"  (1.88 m), weight 245 lb 3.2 oz (111.2 kg), SpO2 97 %.  Body mass index is 31.48 kg/m.  General: Well Developed, well nourished, and in no acute distress.  HEENT: Normocephalic, atraumatic, pupils equal round reactive to light, neck supple, No carotid bruits, no JVD Skin: 3 mm by 5 mm hyperkeratotic skin growth located in the inner right eye near nasal fold.  Otherwise warm and dry, cap RF less 2 sec Cardiac: Regular rate and rhythm, S1, S2 WNL's, no murmurs rubs or gallops Respiratory: ECTA B/L, Not using accessory muscles, speaking in full sentences. NeuroM-Sk: Ambulates w/o assistance, moves ext * 4 w/o  difficulty, sensation grossly intact.  Ext: scant edema b/l lower ext Psych: No HI/SI, judgement and insight good, Euthymic mood. Full Affect.

## 2018-05-28 ENCOUNTER — Other Ambulatory Visit: Payer: Self-pay

## 2018-05-28 DIAGNOSIS — F5105 Insomnia due to other mental disorder: Secondary | ICD-10-CM

## 2018-05-28 DIAGNOSIS — F5104 Psychophysiologic insomnia: Secondary | ICD-10-CM

## 2018-05-28 DIAGNOSIS — F409 Phobic anxiety disorder, unspecified: Secondary | ICD-10-CM

## 2018-05-28 NOTE — Telephone Encounter (Signed)
Pharmacy sent request for Ambien.  Patient is changing pharmacy and they need a new RX sent.  LOV 05/18/2018.  Medication last filled 03/10/18 for # 90  MPulliam, CMA/RT(R)

## 2018-05-31 MED ORDER — ZOLPIDEM TARTRATE ER 12.5 MG PO TBCR: 12.5000 mg | EXTENDED_RELEASE_TABLET | Freq: Every evening | ORAL | 1 refills | Status: DC | PRN

## 2018-05-31 NOTE — Telephone Encounter (Signed)
We have filled this before for the patient.    This is a chronic medication and patient has regular follow-up office visits with me every 4 months or more frequently.   Yes this is okay to fill for patients when this is the case.

## 2018-06-15 ENCOUNTER — Other Ambulatory Visit (INDEPENDENT_AMBULATORY_CARE_PROVIDER_SITE_OTHER): Payer: Medicare Other

## 2018-06-15 ENCOUNTER — Other Ambulatory Visit: Payer: Self-pay

## 2018-06-15 DIAGNOSIS — I151 Hypertension secondary to other renal disorders: Secondary | ICD-10-CM

## 2018-06-15 DIAGNOSIS — E781 Pure hyperglyceridemia: Secondary | ICD-10-CM

## 2018-06-15 DIAGNOSIS — E785 Hyperlipidemia, unspecified: Secondary | ICD-10-CM

## 2018-06-15 DIAGNOSIS — Z0000 Encounter for general adult medical examination without abnormal findings: Secondary | ICD-10-CM

## 2018-06-15 DIAGNOSIS — E559 Vitamin D deficiency, unspecified: Secondary | ICD-10-CM | POA: Diagnosis not present

## 2018-06-15 DIAGNOSIS — E039 Hypothyroidism, unspecified: Secondary | ICD-10-CM | POA: Diagnosis not present

## 2018-06-15 DIAGNOSIS — Z8639 Personal history of other endocrine, nutritional and metabolic disease: Secondary | ICD-10-CM | POA: Diagnosis not present

## 2018-06-16 LAB — COMPREHENSIVE METABOLIC PANEL
ALBUMIN: 4.6 g/dL (ref 3.6–4.8)
ALK PHOS: 57 IU/L (ref 39–117)
ALT: 14 IU/L (ref 0–44)
AST: 19 IU/L (ref 0–40)
Albumin/Globulin Ratio: 2 (ref 1.2–2.2)
BUN / CREAT RATIO: 16 (ref 10–24)
BUN: 18 mg/dL (ref 8–27)
Bilirubin Total: 0.3 mg/dL (ref 0.0–1.2)
CHLORIDE: 102 mmol/L (ref 96–106)
CO2: 22 mmol/L (ref 20–29)
CREATININE: 1.14 mg/dL (ref 0.76–1.27)
Calcium: 9.3 mg/dL (ref 8.6–10.2)
GFR calc Af Amer: 75 mL/min/{1.73_m2} (ref >59)
GFR calc non Af Amer: 65 mL/min/{1.73_m2} (ref >59)
GLUCOSE: 87 mg/dL (ref 65–99)
Globulin, Total: 2.3 g/dL (ref 1.5–4.5)
Potassium: 4.8 mmol/L (ref 3.5–5.2)
Sodium: 141 mmol/L (ref 134–144)
TOTAL PROTEIN: 6.9 g/dL (ref 6.0–8.5)

## 2018-06-16 LAB — LIPID PANEL
CHOL/HDL RATIO: 6 ratio — AB (ref 0.0–5.0)
CHOLESTEROL TOTAL: 198 mg/dL (ref 100–199)
HDL: 33 mg/dL — ABNORMAL LOW (ref >39)
LDL Calculated: 137 mg/dL — ABNORMAL HIGH (ref 0–99)
TRIGLYCERIDES: 142 mg/dL (ref 0–149)
VLDL Cholesterol Cal: 28 mg/dL (ref 5–40)

## 2018-06-16 LAB — HEMOGLOBIN A1C
ESTIMATED AVERAGE GLUCOSE: 105 mg/dL
Hgb A1c MFr Bld: 5.3 % (ref 4.8–5.6)

## 2018-06-16 LAB — CBC WITH DIFFERENTIAL/PLATELET
BASOS ABS: 0.1 10*3/uL (ref 0.0–0.2)
Basos: 1 %
EOS (ABSOLUTE): 0.2 10*3/uL (ref 0.0–0.4)
EOS: 3 %
HEMOGLOBIN: 13.1 g/dL (ref 13.0–17.7)
Hematocrit: 38.7 % (ref 37.5–51.0)
Immature Grans (Abs): 0.1 10*3/uL (ref 0.0–0.1)
Immature Granulocytes: 1 %
LYMPHS ABS: 1 10*3/uL (ref 0.7–3.1)
Lymphs: 16 %
MCH: 30 pg (ref 26.6–33.0)
MCHC: 33.9 g/dL (ref 31.5–35.7)
MCV: 89 fL (ref 79–97)
Monocytes Absolute: 0.5 10*3/uL (ref 0.1–0.9)
Monocytes: 8 %
Neutrophils Absolute: 4.5 10*3/uL (ref 1.4–7.0)
Neutrophils: 71 %
PLATELETS: 222 10*3/uL (ref 150–450)
RBC: 4.37 x10E6/uL (ref 4.14–5.80)
RDW: 13 % (ref 12.3–15.4)
WBC: 6.4 10*3/uL (ref 3.4–10.8)

## 2018-06-16 LAB — TSH: TSH: 1.94 u[IU]/mL (ref 0.450–4.500)

## 2018-06-16 LAB — T4, FREE: Free T4: 1.02 ng/dL (ref 0.82–1.77)

## 2018-06-16 LAB — VITAMIN D 25 HYDROXY (VIT D DEFICIENCY, FRACTURES): Vit D, 25-Hydroxy: 30.3 ng/mL (ref 30.0–100.0)

## 2018-06-17 ENCOUNTER — Encounter: Payer: Self-pay | Admitting: Family Medicine

## 2018-06-17 ENCOUNTER — Ambulatory Visit (INDEPENDENT_AMBULATORY_CARE_PROVIDER_SITE_OTHER): Payer: Medicare Other | Admitting: Family Medicine

## 2018-06-17 VITALS — BP 134/82 | HR 69 | Resp 16 | Wt 244.0 lb

## 2018-06-17 DIAGNOSIS — Z8679 Personal history of other diseases of the circulatory system: Secondary | ICD-10-CM

## 2018-06-17 DIAGNOSIS — B1920 Unspecified viral hepatitis C without hepatic coma: Secondary | ICD-10-CM | POA: Diagnosis not present

## 2018-06-17 DIAGNOSIS — F5104 Psychophysiologic insomnia: Secondary | ICD-10-CM

## 2018-06-17 DIAGNOSIS — Z87891 Personal history of nicotine dependence: Secondary | ICD-10-CM | POA: Diagnosis not present

## 2018-06-17 DIAGNOSIS — E559 Vitamin D deficiency, unspecified: Secondary | ICD-10-CM | POA: Diagnosis not present

## 2018-06-17 DIAGNOSIS — E039 Hypothyroidism, unspecified: Secondary | ICD-10-CM

## 2018-06-17 DIAGNOSIS — I151 Hypertension secondary to other renal disorders: Secondary | ICD-10-CM | POA: Diagnosis not present

## 2018-06-17 DIAGNOSIS — E785 Hyperlipidemia, unspecified: Secondary | ICD-10-CM | POA: Diagnosis not present

## 2018-06-17 DIAGNOSIS — N049 Nephrotic syndrome with unspecified morphologic changes: Secondary | ICD-10-CM

## 2018-06-17 MED ORDER — ATORVASTATIN CALCIUM 10 MG PO TABS: 10.0000 mg | ORAL_TABLET | Freq: Every evening | ORAL | 3 refills | Status: DC

## 2018-06-17 MED ORDER — LOSARTAN POTASSIUM-HCTZ 100-12.5 MG PO TABS: 1.0000 | ORAL_TABLET | Freq: Every day | ORAL | 3 refills | Status: DC

## 2018-06-17 NOTE — Progress Notes (Signed)
Assessment and plan:  1. 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 Hyperlipidemia, unspecified hyperlipidemia type   3. H/O CHF   4. Signficant h/o tobacco abuse (150pk/yr hx)   5. 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 Hepatitis C virus infection without hepatic coma, unspecified chronicity   7. Hypothyroidism, unspecified type   8. Psychophysiological insomnia   9. Vitamin D deficiency     1. Reviewed recent lab work (06/15/2018) in depth with patient today.  All lab work within normal limits unless otherwise noted.  2. Vitamin D Deficiency - Discontinue OTC Vitamin D. - Begin once-weekly prescription Vitamin D.  See med list today.  3. Lipid Panel - Hyperlipidemia, low HDL, h/o CHF Triglycerides = 142. HDL = 33, low. LDL = 137, high.  - Patient's 10-year ASCVD risk is 25.1%.  - Recommended that patient begin treatment on statins today. - Emphasized importance of adequate hydration and exercise to minimize S-E.  - Dietary changes such as low saturated & trans fat and low carb/ ketogenic diets discussed with patient.  Encouraged regular exercise and weight loss when appropriate.   - Educational handouts provided at patient's desire.  4. Secondary Hypertension due to Renal Disease - Blood pressure remains elevated at this time. - Recommended change in treatment plan today.   - Blood pressure medication changed today.  See med list below. - Patient tolerating meds well without complication.  Denies S-E  - Lifestyle changes such as dash diet and engaging in a regular exercise program discussed with patient.  Educational handouts provided  - Ambulatory BP monitoring encouraged. Keep log and bring in next OV.  5. Kidney Function - Nephrotic Syndrome due to Berger's Disease - Continue to exercise and consume adequate amounts of water. - Advised patient to follow treatment plans and prudent  habits as recommended to preserve kidney health.  6. Mood - Anxiety - Stable, managed on Celexa. - Continue treatment plan as prescribed.  See med list below. - Patient tolerating meds well without complication.  Denies S-E  7. Psychophysiological Insomnia - Stable at this time; managed on Ambien. - Patient with no desire to discontinue use of Ambien. - Continue treatment plan as prescribed.  See med list below. - Patient tolerating meds well without complication.  Denies S-E.  8. Lifestyle & Preventative Health Maintenance - Advised patient to continue working toward exercising to improve overall mental, physical, and emotional health.    - Encouraged patient to engage in daily physical activity, especially a formal exercise routine.  Begin slowly, at about 10 minutes per day, until the body acclimates to it.  Recommended that the patient eventually strive for at least 150 minutes of moderate cardiovascular activity per week according to guidelines established by the [facility].   - Healthy dietary habits encouraged, including low-carb, and high amounts of lean protein in diet.   - Patient should also consume adequate amounts of water.   Education and routine counseling performed. Handouts provided.  9. Follow-Up - Prescriptions provided and refilled today PRN. - Re-check fasting lab work as recommended.   - CMP will be drawn in near future for assessment on new Rx. - Otherwise, continue to return for CPE and chronic follow-up as scheduled.   - Patient knows to call in sooner if desired to address acute concerns.    Orders Placed This Encounter  Procedures  . Comprehensive metabolic panel    Meds ordered this encounter  Medications  . losartan-hydrochlorothiazide (HYZAAR) 100-12.5 MG tablet    Sig: Take 1 tablet by mouth daily.    Dispense:  90 tablet    Refill:  3  . atorvastatin (LIPITOR) 10 MG tablet    Sig: Take 1 tablet (10 mg total) by mouth Nightly.    Dispense:  90  tablet    Refill:  3    Medications Discontinued During This Encounter  Medication Reason  . losartan (COZAAR) 50 MG tablet Dose change     Return for 6 weeks CMP, 63-month follow-up OV BP changed meds, cholesterol add Lipitor.   Anticipatory guidance and routine counseling done re: condition, txmnt options and need for follow up. All questions of patient's were answered.   Gross side effects, risk and benefits, and alternatives of medications discussed with patient.  Patient is aware that all medications have potential side effects and we are unable to predict every sideeffect or drug-drug interaction that may occur.  Expresses verbal understanding and consents to current therapy plan and treatment regiment.  Please see AVS handed out to patient at the end of our visit for additional patient instructions/ counseling done pertaining to today's office visit.  Note:  This document was prepared using Dragon voice recognition software and may include unintentional dictation errors.  This document serves as a record of services personally performed by [name], DO. It was created on her behalf by Toni Amend, a trained medical scribe. The creation of this record is based on the scribe's personal observations and the provider's statements to them.   I have reviewed the above medical documentation for accuracy and completeness and I concur.  [name] 06/17/18 2:34 PM   ----------------------------------------------------------------------------------------------------------------------  Subjective:   CC:   George [name] is a 67 y.o. male who presents to [place] at [facility] today for review and discussion of recent bloodwork that was done in addition to f/up on chronic conditions we are managing for pt.  1. All recent blood work that we ordered was reviewed with patient today.  Patient was counseled on all abnormalities and we discussed dietary and  lifestyle changes that could help those values (also medications when appropriate).  Extensive health counseling performed and all patient's concerns/ questions were addressed.  See labs below and also plan for more details of these abnormalities  Has been using his stationary bicycle to increase his activity levels.  Former Smoker Quit smoking 3-4 years ago.  Patient with 100 pack-year history.  Denies swelling in the legs.  States "they sort of stay tender."  Hyperlipidemia Patient is not managed on a cholesterol medication due to "not wanting to be on it."  Insomnia Patient takes Ambien every night. He has no interest in discontinuing use of Ambien.  Mood - Anxiety States that using Celexa has helped his mood remain well managed.  1. HTN HPI:  -  His blood pressure has not been controlled at home.  Pt is checking it at home.   Home blood pressures range: 133/108 135/97 125/90 135/93 153/100 153/95 141/93 141/98  - Patient has a history of noncompliance with blood pressure medications. However, notes he's been much more consistent with taking his medications.  - Denies medication S-E   - Smoking Status noted   - He denies new onset of: chest pain, exercise intolerance, shortness of breath, dizziness, visual changes, headache, lower extremity swelling or claudication.   Last 3 blood pressure readings in our office are as follows:  BP Readings from Last 3 Encounters:  06/17/18 134/82  05/18/18 138/86  01/13/18 112/78    Filed Weights   06/17/18 1326  Weight: 244 lb (110.7 kg)     Wt Readings from Last 3 Encounters:  06/17/18 244 lb (110.7 kg)  05/18/18 245 lb 3.2 oz (111.2 kg)  01/13/18 238 lb 4.8 oz (108.1 kg)   BP Readings from Last 3 Encounters:  06/17/18 134/82  05/18/18 138/86  01/13/18 112/78   Pulse Readings from Last 3 Encounters:  06/17/18 69  05/18/18 69  01/13/18 76   BMI Readings from Last 3 Encounters:  06/17/18 31.33 kg/m  05/18/18  31.48 kg/m  01/13/18 30.60 kg/m     Patient Care Team    Relationship Specialty Notifications Start End  [name], DO PCP - General Family Medicine  03/03/16   Lorretta Harp, MD Consulting Physician Cardiology  04/05/16   Estanislado Emms, MD Consulting Physician Nephrology  04/05/16   Evans Lance, MD Consulting Physician Clinical Cardiac Electrophysiology  04/05/16    Comment: his  EP  [facility]  Comer, Okey Regal, MD Consulting Physician Infectious Diseases  04/05/16    Comment: Hep C txmnt  Liliane Shi, PA-C Physician Assistant Cardiology  04/05/16    Comment: follows pt's fluid status---> L Ext edema  Dillingham, Loel Lofty, DO Attending Physician Plastic Surgery  04/05/16    Comment: Dr. Lyndee Leo Sanger   ( plastics and reconstuctive sx at [facility]) sees her in [place] at [facility];  she is who has diagnosed him with Berger's disease    Full medical history updated and reviewed in the office today  Patient Active Problem List   Diagnosis Date Noted  . HLD (hyperlipidemia) 05/15/2017    Priority: High  . 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 Alcoholism /alcohol abuse- episodic binges 04/05/2016    Priority: High  . 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 Insomnia 04/05/2016    Priority: High  . Noncompliance with diet and medication regimen 04/05/2016    Priority: High  . Signficant h/o tobacco abuse (150pk/yr hx) 03/03/2016    Priority: High  . h/o Basal cell carcinoma of nose 03/03/2016    Priority: High  . GAD (generalized anxiety disorder) 03/03/2016    Priority: High  . h/o Hepatitis C infection 11/14/2015    Priority: High  . COPD mixed type ([place]) 10/12/2015    Priority: High  . AAA -   (f/up US 12/2018) 09/24/2015    Priority: High  . h/o Hypothyroidism 09/22/2015    Priority: High  . Berger's disease: IgA Nephropathy  10/12/2015    Priority: Medium  . Venous insufficiency of both  lower extremities 09/21/2015    Priority: Medium  . H/O CHF 08/20/2015    Priority: Medium  . h/o SVT (supraventricular tachycardia) s/p Ablation 05/22/2015    Priority: Medium  . Obesity, Class I, BMI 30-34.9 04/29/2017    Priority: Low  . Elevated blood pressure reading- situational 04/29/2017    Priority: Low  . History of elevated glucose 04/29/2017    Priority: Low  . Colonoscopy refused 04/29/2017    Priority: Low  . Vitamin D deficiency 05/19/2016    Priority: Low  . Liver fibrosis 03/24/2016    Priority: Low  . Abnormal skin growth- R inner nasal lacrimal fold 05/18/2018  . Excessive use of nonsteroidal anti-inflammatory drug (NSAID) 01/17/2018  .  Chronic Right leg pain-  pt w/ history of ORIF femur 30-40 years ago increasing pain for 1 year, terrible pain 3 months 01/17/2018  . Insomnia due to anxiety and fear 09/15/2017  . Positive for macroalbuminuria 05/15/2017  . Low level of high density lipoprotein (HDL) 05/15/2017  . Hypertriglyceridemia 05/15/2017    Past Medical History:  Diagnosis Date  . AAA (abdominal aortic aneurysm) ([place])    a. [place] 09/2015 - 3cm distal AAA. //  b. [place] 5/17: 3.1 cm   . Basal cell carcinoma of right side of nose   . Berger's disease   . CKD (chronic kidney disease) 09/2015  . GERD (gastroesophageal reflux disease)   . Nephrotic syndrome   . Panic attacks   . SVT (supraventricular tachycardia) ([place]) 05/22/2015    Past Surgical History:  Procedure Laterality Date  . BASAL CELL CARCINOMA EXCISION Right ~ 06/2015   "side of my nose"  . ELECTROPHYSIOLOGIC STUDY N/A 09/10/2015   Procedure: SVT Ablation;  Surgeon: Evans Lance, MD;  Location: [facility];  Service: Cardiovascular;  Laterality: N/A;  . FEMUR FRACTURE SURGERY Right 1970   "had plate & pin put in"  . FEMUR HARDWARE REMOVAL Right 1971   "removed plate; left pin in  . FRACTURE SURGERY    . KNEE ARTHROSCOPY Right ~ 1985  . TONSILLECTOMY AND ADENOIDECTOMY  ~ 1955     Social History   Tobacco Use  . Smoking status: Former Smoker    Packs/day: 2.00    Years: 50.00    Pack years: 100.00    Types: Cigarettes    Last attempt to quit: 01/24/2015    Years since quitting: 3.3  . Smokeless tobacco: Never Used  Substance Use Topics  . Alcohol use: Yes    Alcohol/week: 3.0 - 4.0 standard drinks    Types: 3 - 4 Shots of liquor per week    Family Hx: Family History  Problem Relation Age of Onset  . Heart disease Mother        Before age 64  . Heart attack Mother   . Cancer Mother        lung and colon  . Stroke Mother   . Heart failure Father   . Heart disease Father        After age 51  . Heart attack Father   . Hyperlipidemia Father   . Healthy Maternal Grandmother   . Healthy Maternal Grandfather   . Diabetes Paternal Grandmother   . Healthy Paternal Grandfather   . Hypertension Neg Hx      Medications: Current Outpatient Medications  Medication Sig Dispense Refill  . acetaminophen (TYLENOL) 500 MG tablet Take 500 mg by mouth every 6 (six) hours as needed.    Marland Kitchen aspirin EC 81 MG EC tablet Take 1 tablet (81 mg total) by mouth daily.    . Cholecalciferol (VITAMIN D3) 5000 units CAPS Take 1 capsule by mouth daily.    . citalopram (CELEXA) 20 MG tablet Take 1 tablet daily. 90 tablet 3  . diclofenac (VOLTAREN) 75 MG EC tablet Take 75 mg by mouth 2 (two) times daily.  0  . HYDROcodone-acetaminophen (NORCO) 10-325 MG tablet 1 to 2 tab po q 6 hr prn SEVERE pain 40 tablet 0  . zolpidem (AMBIEN CR) 12.5 MG CR tablet Take 1 tablet (12.5 mg total) by mouth at bedtime as needed. for sleep 90 tablet 1  . atorvastatin (LIPITOR) 10 MG tablet Take 1 tablet (10  mg total) by mouth Nightly. 90 tablet 3  . losartan-hydrochlorothiazide (HYZAAR) 100-12.5 MG tablet Take 1 tablet by mouth daily. 90 tablet 3   No current facility-administered medications for this visit.     Allergies:  No Known Allergies   Review of Systems: General:   No F/C, wt  loss Pulm:   No DIB, SOB, pleuritic chest pain Card:  No CP, palpitations Abd:  No n/v/d or pain Ext:  No inc edema from baseline  Objective:  Blood pressure 134/82, pulse 69, resp. rate 16, weight 244 lb (110.7 kg), SpO2 97 %. Body mass index is 31.33 kg/m. Gen:   Well NAD, A and O *3 HEENT:    [place]/AT, EOMI,  MMM Lungs:   Normal work of breathing. CTA B/L, no Wh, rhonchi Heart:   RRR, S1, S2 WNL's, no MRG Abd:   No gross distention Exts:    warm, pink,  Brisk capillary refill, warm and well perfused.  Psych:    No HI/SI, judgement and insight good, Euthymic mood. Full Affect.   Recent Results (from the past 2160 hour(s))  T4, free     Status: None   Collection Time: 06/15/18  9:37 AM  Result Value Ref Range   Free T4 1.02 0.82 - 1.77 ng/dL  TSH     Status: None   Collection Time: 06/15/18  9:37 AM  Result Value Ref Range   TSH 1.940 0.450 - 4.500 uIU/mL  VITAMIN D 25 Hydroxy (Vit-D Deficiency, Fractures)     Status: None   Collection Time: 06/15/18  9:37 AM  Result Value Ref Range   Vit D, 25-Hydroxy 30.3 30.0 - 100.0 ng/mL    Comment: Vitamin D deficiency has been defined by the [place] practice guideline as a level of serum 25-OH vitamin D less than 20 ng/mL (1,2). The Endocrine Society went on to further define vitamin D insufficiency as a level between 21 and 29 ng/mL (2). 1. IOM (Institute of Medicine). 2010. Dietary reference    intakes for calcium and D. [place]: The    [employer]. 2. Holick MF, Binkley Eldon, Bischoff-Ferrari HA, et al.    Evaluation, treatment, and prevention of vitamin D    deficiency: an Endocrine Society clinical practice    guideline. JCEM. 2011 Jul; 96(7):1911-30.   Lipid panel     Status: Abnormal   Collection Time: 06/15/18  9:37 AM  Result Value Ref Range   Cholesterol, Total 198 100 - 199 mg/dL   Triglycerides 142 0 - 149 mg/dL   HDL 33 (L) >39 mg/dL   VLDL Cholesterol Cal 28  5 - 40 mg/dL   LDL Calculated 137 (H) 0 - 99 mg/dL   Chol/HDL Ratio 6.0 (H) 0.0 - 5.0 ratio    Comment:                                   T. Chol/HDL Ratio                                             Men  Women                               1/2 Avg.Risk  3.4    3.3                                   Avg.Risk  5.0    4.4                                2X Avg.Risk  9.6    7.1                                3X Avg.Risk 23.4   11.0   Hemoglobin A1c     Status: None   Collection Time: 06/15/18  9:37 AM  Result Value Ref Range   Hgb A1c MFr Bld 5.3 4.8 - 5.6 %    Comment:          Prediabetes: 5.7 - 6.4          Diabetes: >6.4          Glycemic control for adults with diabetes: <7.0    Est. average glucose Bld gHb Est-mCnc 105 mg/dL  Comprehensive metabolic panel     Status: None   Collection Time: 06/15/18  9:37 AM  Result Value Ref Range   Glucose 87 65 - 99 mg/dL   BUN 18 8 - 27 mg/dL   Creatinine, Ser 1.14 0.76 - 1.27 mg/dL   GFR calc non Af Amer 65 >59 mL/min/1.73   GFR calc Af Amer 75 >59 mL/min/1.73   BUN/Creatinine Ratio 16 10 - 24   Sodium 141 134 - 144 mmol/L   Potassium 4.8 3.5 - 5.2 mmol/L   Chloride 102 96 - 106 mmol/L   CO2 22 20 - 29 mmol/L   Calcium 9.3 8.6 - 10.2 mg/dL   Total Protein 6.9 6.0 - 8.5 g/dL   Albumin 4.6 3.6 - 4.8 g/dL   Globulin, Total 2.3 1.5 - 4.5 g/dL   Albumin/Globulin Ratio 2.0 1.2 - 2.2   Bilirubin Total 0.3 0.0 - 1.2 mg/dL   Alkaline Phosphatase 57 39 - 117 IU/L   AST 19 0 - 40 IU/L   ALT 14 0 - 44 IU/L  CBC with Differential/Platelet     Status: None   Collection Time: 06/15/18  9:37 AM  Result Value Ref Range   WBC 6.4 3.4 - 10.8 x10E3/uL   RBC 4.37 4.14 - 5.80 x10E6/uL   Hemoglobin 13.1 13.0 - 17.7 g/dL   Hematocrit 38.7 37.5 - 51.0 %   MCV 89 79 - 97 fL   MCH 30.0 26.6 - 33.0 pg   MCHC 33.9 31.5 - 35.7 g/dL   RDW 13.0 12.3 - 15.4 %   Platelets 222 150 - 450 x10E3/uL   Neutrophils 71 Not Estab. %   Lymphs 16 Not Estab. %    Monocytes 8 Not Estab. %   Eos 3 Not Estab. %   Basos 1 Not Estab. %   Neutrophils Absolute 4.5 1.4 - 7.0 x10E3/uL   Lymphocytes Absolute 1.0 0.7 - 3.1 x10E3/uL   Monocytes Absolute 0.5 0.1 - 0.9 x10E3/uL   EOS (ABSOLUTE) 0.2 0.0 - 0.4 x10E3/uL   Basophils Absolute 0.1 0.0 - 0.2 x10E3/uL   Immature Granulocytes 1 Not Estab. %   Immature Grans (Abs) 0.1 0.0 - 0.1 x10E3/uL

## 2018-06-17 NOTE — Patient Instructions (Addendum)
Please send these notes to his nephrologist Dr. Erling Cruz so he can see we changed blood pressure med and add an arm.  -Mr. Nehme make sure you do follow-up with your nephrologist Dr. Florene Glen yearly.  Make sure he is aware of the medication changes and okay with them.  -As always, please start with 1/2 tablet of the losartan-hydrochlorothiazide and be checking your blood pressure to a goal of less then 135/85 on a regular basis.  Also, please start with one half tab of the atorvastatin\Lipitor nightly before bed.  After 1 week and proper hydration as well as exercise daily to goal 30 minutes, increase to 1 tablet nightly.  Guidelines for a Low Cholesterol, Low Saturated Fat Diet   Fats - Limit total intake of fats and oils. - Avoid butter, stick margarine, shortening, lard, palm and coconut oils. - Limit mayonnaise, salad dressings, gravies and sauces, unless they are homemade with low-fat ingredients. - Limit chocolate. - Choose low-fat and nonfat products, such as low-fat mayonnaise, low-fat or non-hydrogenated peanut butter, low-fat or fat-free salad dressings and nonfat gravy. - Use vegetable oil, such as canola or olive oil. - Look for margarine that does not contain trans fatty acids. - Use nuts in moderate amounts. - Read ingredient labels carefully to determine both amount and type of fat present in foods. Limit saturated and trans fats! - Avoid high-fat processed and convenience foods.  Meats and Meat Alternatives - Choose fish, chicken, [place] and lean meats. - Use dried beans, peas, lentils and tofu. - Limit egg yolks to three to four per week. - If you eat red meat, limit to no more than three servings per week and choose loin or round cuts. - Avoid fatty meats, such as bacon, sausage, franks, luncheon meats and ribs. - Avoid all organ meats, including liver.  Dairy - Choose nonfat or low-fat milk, yogurt and cottage cheese. - Most cheeses are high in fat. Choose  cheeses made from non-fat milk, such as mozzarella and ricotta cheese. - Choose light or fat-free cream cheese and sour cream. - Avoid cream and sauces made with cream.  Fruits and Vegetables - Eat a wide variety of fruits and vegetables. - Use lemon juice, vinegar or "mist" olive oil on vegetables. - Avoid adding sauces, fat or oil to vegetables.  Breads, Cereals and Grains - Choose whole-grain breads, cereals, pastas and rice. - Avoid high-fat snack foods, such as granola, cookies, pies, pastries, doughnuts and croissants.  Cooking Tips - Avoid deep fried foods. - Trim visible fat off meats and remove skin from poultry before cooking. - Bake, broil, boil, poach or roast poultry, fish and lean meats. - Drain and discard fat that drains out of meat as you cook it. - Add little or no fat to foods. - Use vegetable oil sprays to grease pans for cooking or baking. - Steam vegetables. - Use herbs or no-oil marinades to flavor foods.

## 2018-06-22 DIAGNOSIS — C44111 Basal cell carcinoma of skin of unspecified eyelid, including canthus: Secondary | ICD-10-CM | POA: Diagnosis not present

## 2018-06-22 DIAGNOSIS — D485 Neoplasm of uncertain behavior of skin: Secondary | ICD-10-CM | POA: Diagnosis not present

## 2018-06-22 DIAGNOSIS — C44319 Basal cell carcinoma of skin of other parts of face: Secondary | ICD-10-CM | POA: Diagnosis not present

## 2018-07-30 ENCOUNTER — Other Ambulatory Visit: Payer: Medicare Other

## 2018-07-30 DIAGNOSIS — I151 Hypertension secondary to other renal disorders: Secondary | ICD-10-CM

## 2018-07-30 DIAGNOSIS — E785 Hyperlipidemia, unspecified: Secondary | ICD-10-CM | POA: Diagnosis not present

## 2018-07-30 DIAGNOSIS — N049 Nephrotic syndrome with unspecified morphologic changes: Secondary | ICD-10-CM

## 2018-07-30 DIAGNOSIS — Z87891 Personal history of nicotine dependence: Secondary | ICD-10-CM

## 2018-07-31 LAB — COMPREHENSIVE METABOLIC PANEL
A/G RATIO: 1.9 (ref 1.2–2.2)
ALT: 12 IU/L (ref 0–44)
AST: 11 IU/L (ref 0–40)
Albumin: 4.8 g/dL (ref 3.6–4.8)
Alkaline Phosphatase: 64 IU/L (ref 39–117)
BUN/Creatinine Ratio: 21 (ref 10–24)
BUN: 33 mg/dL — ABNORMAL HIGH (ref 8–27)
Bilirubin Total: 0.3 mg/dL (ref 0.0–1.2)
CO2: 23 mmol/L (ref 20–29)
Calcium: 9.5 mg/dL (ref 8.6–10.2)
Chloride: 103 mmol/L (ref 96–106)
Creatinine, Ser: 1.6 mg/dL — ABNORMAL HIGH (ref 0.76–1.27)
GFR calc Af Amer: 50 mL/min/{1.73_m2} — ABNORMAL LOW (ref >59)
GFR calc non Af Amer: 43 mL/min/{1.73_m2} — ABNORMAL LOW (ref >59)
GLOBULIN, TOTAL: 2.5 g/dL (ref 1.5–4.5)
Glucose: 94 mg/dL (ref 65–99)
POTASSIUM: 4.8 mmol/L (ref 3.5–5.2)
SODIUM: 142 mmol/L (ref 134–144)
Total Protein: 7.3 g/dL (ref 6.0–8.5)

## 2018-08-05 ENCOUNTER — Telehealth: Payer: Self-pay | Admitting: Family Medicine

## 2018-08-05 NOTE — Telephone Encounter (Signed)
Patient states George [name] called him & he is returning her message to call office.  --Press photographer.  --George [name]

## 2018-08-05 NOTE — Telephone Encounter (Signed)
Patient notified of lab results.  Please see result note. MPulliam, CMA/RT(R)

## 2018-08-24 ENCOUNTER — Encounter: Payer: Self-pay | Admitting: Family Medicine

## 2018-08-24 ENCOUNTER — Ambulatory Visit (INDEPENDENT_AMBULATORY_CARE_PROVIDER_SITE_OTHER): Payer: Medicare Other | Admitting: Family Medicine

## 2018-08-24 VITALS — BP 111/72 | HR 80 | Temp 98.2°F | Ht 74.0 in | Wt 238.2 lb

## 2018-08-24 DIAGNOSIS — E559 Vitamin D deficiency, unspecified: Secondary | ICD-10-CM | POA: Diagnosis not present

## 2018-08-24 DIAGNOSIS — Z87891 Personal history of nicotine dependence: Secondary | ICD-10-CM | POA: Diagnosis not present

## 2018-08-24 DIAGNOSIS — E669 Obesity, unspecified: Secondary | ICD-10-CM | POA: Diagnosis not present

## 2018-08-24 DIAGNOSIS — N049 Nephrotic syndrome with unspecified morphologic changes: Secondary | ICD-10-CM | POA: Diagnosis not present

## 2018-08-24 DIAGNOSIS — F5105 Insomnia due to other mental disorder: Secondary | ICD-10-CM | POA: Diagnosis not present

## 2018-08-24 DIAGNOSIS — F409 Phobic anxiety disorder, unspecified: Secondary | ICD-10-CM

## 2018-08-24 DIAGNOSIS — F5104 Psychophysiologic insomnia: Secondary | ICD-10-CM | POA: Diagnosis not present

## 2018-08-24 DIAGNOSIS — I151 Hypertension secondary to other renal disorders: Secondary | ICD-10-CM

## 2018-08-24 DIAGNOSIS — E781 Pure hyperglyceridemia: Secondary | ICD-10-CM | POA: Diagnosis not present

## 2018-08-24 DIAGNOSIS — E785 Hyperlipidemia, unspecified: Secondary | ICD-10-CM

## 2018-08-24 MED ORDER — ZOLPIDEM TARTRATE ER 12.5 MG PO TBCR: 12.5000 mg | EXTENDED_RELEASE_TABLET | Freq: Every evening | ORAL | 1 refills | Status: DC | PRN

## 2018-08-24 MED ORDER — CARVEDILOL 6.25 MG PO TABS: 6.2500 mg | ORAL_TABLET | Freq: Two times a day (BID) | ORAL | 0 refills | Status: DC

## 2018-08-24 MED ORDER — LOSARTAN POTASSIUM 50 MG PO TABS: 50.0000 mg | ORAL_TABLET | Freq: Every day | ORAL | 3 refills | Status: DC
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
## 2018-08-24 NOTE — Patient Instructions (Signed)
Please start by discontinuing the losartan-HCTZ medicine.  You will go back to your original losartan 50 mg daily. -We are adding carvedilol to your regiment.  Please do not take as written on bottle but start with 1/2 tablet twice daily.   If your blood pressure is well controlled and around 130s over 80s on a regular basis, then there is no need to go to the full tablet.  -When you follow-up in 3 months, we will recheck your cholesterol, vitamin D, and BMP w/ GFR  to see how your kidneys are doing.  We can do this 3 to 4 days prior to your appointment, so please make 2 appointments.  Melissa please put these in for future lab draw order. THNX     Nephrotic Syndrome Nephrotic syndrome is a set of findings that show there is a problem with the kidneys. These findings include:  High levels of protein in the urine (proteinuria).  High blood pressure (hypertension).  Low levels of the protein albumin in the blood (hypoalbuminemia).  High levels of cholesterol (hyperlipidemia) and triglycerides (hypertriglyceridemia) in the blood.  Swelling (edema) of the face, abdomen, arms, and legs. Nephrotic syndrome occurs when the kidneys' filters (glomeruli) are damaged. Glomeruli remove toxins and waste products from the bloodstream. As a result of damaged glomeruli, essential products such as proteins may also be removed from the bloodstream. Nephrotic syndrome results from the loss of proteins and other substances that the body needs. Nephrotic syndrome may increase your risk of further kidney damage and of health problems such as blood clots and infections. What are the causes? This condition may be caused by:  A kidney disease that damages the glomeruli, such as: ? Minimal change disease. ? Focal segmental glomerulosclerosis. ? Membranous nephropathy. ? Glomerulonephritis.  A condition or disease that affects other parts of the body (systemic), such as: ? Diabetes. ? Autoimmune diseases, such  as lupus. ? Amyloidosis. ? Multiple myeloma. ? Some types of cancers. ? An infection, such as hepatitis C.  Certain medicines, such as: ? Nonsteroidal anti-inflammatory drugs (NSAIDs). ? Some anticancer drugs. In some cases, the cause may not be known. What are the signs or symptoms? Symptoms of this condition include:  Edema.  Foamy urine.  Unexplained weight gain.  Loss of appetite. In some cases, there are no noticeable symptoms. How is this diagnosed? This condition is usually diagnosed with a dipstick urine test followed by a 24-hour urine test in which you collect all of the urine that you produce over a 24-hour period. If your test shows that you have this condition, more tests may be needed to find the cause. These may include blood, urine, imaging, or kidney biopsy tests. How is this treated? Treatment for this condition may include medicines to control symptoms or to prevent complications from occurring. These medicines may:  Decrease inflammation in the kidneys.  Lower blood pressure.  Lower cholesterol.  Reduce the blood's ability to clot.  Help control edema. Further treatment will depend on the cause of the condition. Your health care provider will discuss treatment options with you. Follow these instructions at home:  Follow diet instructions from your health care provider. This may include changes to help manage edema or hypertension, such as limiting your intake of salt or fluids.  Take over-the-counter and prescription medicines only as told by your health care provider. ? Do not take any new over-the-counter medicines or nutritional supplements unless approved by your health care provider. Many medicines can make this  condition worse. Doses may need to be adjusted.  Keep all follow-up visits as told by your health care provider. This is important. Contact a health care provider if:  Your symptoms do not go away as expected or you develop new  symptoms.  You continue to gain weight.  You have increased swelling of the feet, ankles, or legs. Get help right away if:  You stop producing urine.  You have prolonged bleeding.  You have shortness of breath.  You have a severe headache.  You have severe weakness. This information is not intended to replace advice given to you by your health care provider. Make sure you discuss any questions you have with your health care provider. Document Released: 06/20/2004 Document Revised: 07/21/2016 Document Reviewed: 07/21/2016 Elsevier Interactive Patient Education  2019 [employer].

## 2018-09-01 DIAGNOSIS — M79604 Pain in right leg: Secondary | ICD-10-CM | POA: Diagnosis not present

## 2018-09-03 ENCOUNTER — Other Ambulatory Visit (HOSPITAL_COMMUNITY): Payer: Self-pay | Admitting: Orthopedic Surgery

## 2018-09-03 ENCOUNTER — Other Ambulatory Visit: Payer: Self-pay | Admitting: Orthopedic Surgery

## 2018-09-03 DIAGNOSIS — M79604 Pain in right leg: Secondary | ICD-10-CM

## 2018-09-10 ENCOUNTER — Encounter (HOSPITAL_COMMUNITY)
Admission: RE | Admit: 2018-09-10 | Discharge: 2018-09-10 | Disposition: A | Discharge status: Home / Self Care | Payer: Medicare Other | Source: Ambulatory Visit | Attending: Orthopedic Surgery | Admitting: Orthopedic Surgery

## 2018-09-10 ENCOUNTER — Ambulatory Visit (HOSPITAL_COMMUNITY)
Admission: RE | Admit: 2018-09-10 | Discharge: 2018-09-10 | Disposition: A | Discharge status: Home / Self Care | Payer: Medicare Other | Source: Ambulatory Visit | Attending: Orthopedic Surgery | Admitting: Orthopedic Surgery

## 2018-09-10 DIAGNOSIS — M79604 Pain in right leg: Secondary | ICD-10-CM | POA: Diagnosis not present

## 2018-09-10 MED ORDER — TECHNETIUM TC 99M MEDRONATE IV KIT
20.0000 | PACK | Freq: Once | INTRAVENOUS | Status: AC | PRN
  Administered 2018-09-10: 20 via INTRAVENOUS

## 2018-09-24 DIAGNOSIS — M79604 Pain in right leg: Secondary | ICD-10-CM | POA: Diagnosis not present

## 2018-11-17 ENCOUNTER — Other Ambulatory Visit: Payer: Medicare Other

## 2018-11-17 ENCOUNTER — Other Ambulatory Visit: Payer: Self-pay

## 2018-11-17 ENCOUNTER — Telehealth: Payer: Self-pay | Admitting: Family Medicine

## 2018-11-17 DIAGNOSIS — F5104 Psychophysiologic insomnia: Secondary | ICD-10-CM

## 2018-11-17 DIAGNOSIS — F409 Phobic anxiety disorder, unspecified: Secondary | ICD-10-CM

## 2018-11-17 DIAGNOSIS — F5105 Insomnia due to other mental disorder: Secondary | ICD-10-CM

## 2018-11-17 NOTE — Telephone Encounter (Signed)
Notified patient that he has a refill on medication and to call pharmacy so they can get it ready for him. MPulliam, CMA/RT(R)

## 2018-11-17 NOTE — Telephone Encounter (Signed)
Patient called and requesting refill on zolpidem.  Last filled on 08/24/2018 for #90 1 RF.  Patient still has refill.  Called patient and notified him that he just needs to call the pharmacy for them to fill medication. MPulliam, CMA/RT(R)

## 2018-11-17 NOTE — Telephone Encounter (Signed)
Patient is requesting a refill of his Ambien, if approved please send to [facility]

## 2018-11-23 ENCOUNTER — Ambulatory Visit: Payer: Medicare Other | Admitting: Family Medicine

## 2018-12-27 ENCOUNTER — Telehealth: Payer: Self-pay | Admitting: Family Medicine

## 2018-12-27 NOTE — Telephone Encounter (Signed)
Patient called, says went to Ortho Specialist but was dissatisfied w/treatment outcome & is requesting a Referral to Pain Mgt &  He Right now wants a Rx to help get the pain under control.   --Patient uses :   [place], [place] - [place] [phone] (Phone) [phone] (Fax)   ---Forwarding pt's request to medical assistant for review with provider----- Call pt at [phone] if any questions .  --glh
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
## 2018-12-27 NOTE — Telephone Encounter (Signed)
I do not have an orthopedic doctor written down on patient's care team.  Please ask him who if he is currently seeing-the name of the doctor and practice.  Also, since he was already seen at that orthopedist, it really would be best if they refer him since they know the whole history of his leg pain.

## 2018-12-28 NOTE — Telephone Encounter (Signed)
Called the patient that per discussion with Dr. Raliegh Scarlet that it may be best for him to contact the ortho for referral and short term medication since they have been treating for the condition.  Per Patient he does not feel comfortable at [facility] does not feel like they know him or listen to him and he would prefer to keep appointment with Dr. Raliegh Scarlet tomorrow to discuss. MPulliam, CMA/RT(R)

## 2018-12-29 ENCOUNTER — Other Ambulatory Visit: Payer: Self-pay

## 2018-12-29 ENCOUNTER — Ambulatory Visit (INDEPENDENT_AMBULATORY_CARE_PROVIDER_SITE_OTHER): Payer: Medicare Other | Admitting: Family Medicine

## 2018-12-29 ENCOUNTER — Encounter: Payer: Self-pay | Admitting: Family Medicine

## 2018-12-29 VITALS — BP 135/94 | HR 98 | Ht 74.0 in | Wt 235.0 lb

## 2018-12-29 DIAGNOSIS — M79604 Pain in right leg: Secondary | ICD-10-CM | POA: Diagnosis not present
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
## 2019-01-05 ENCOUNTER — Ambulatory Visit: Payer: Medicare Other | Admitting: Family Medicine

## 2019-01-22 ENCOUNTER — Other Ambulatory Visit: Payer: Self-pay | Admitting: Family Medicine

## 2019-01-22 DIAGNOSIS — F411 Generalized anxiety disorder: Secondary | ICD-10-CM

## 2019-01-22 DIAGNOSIS — F409 Phobic anxiety disorder, unspecified: Secondary | ICD-10-CM

## 2019-01-24 ENCOUNTER — Other Ambulatory Visit: Payer: Self-pay

## 2019-01-24 NOTE — Telephone Encounter (Signed)
Opened in error. T. Nelson, CMA 

## 2019-02-08 ENCOUNTER — Ambulatory Visit: Payer: Medicare Other | Admitting: Family Medicine

## 2019-02-09 ENCOUNTER — Encounter: Payer: Self-pay | Admitting: Family Medicine

## 2019-02-09 ENCOUNTER — Ambulatory Visit (INDEPENDENT_AMBULATORY_CARE_PROVIDER_SITE_OTHER): Payer: Medicare Other | Admitting: Family Medicine

## 2019-02-09 ENCOUNTER — Other Ambulatory Visit: Payer: Self-pay

## 2019-02-09 VITALS — BP 108/78 | HR 79 | Temp 98.4°F | Ht 72.0 in | Wt 234.6 lb

## 2019-02-09 DIAGNOSIS — F5104 Psychophysiologic insomnia: Secondary | ICD-10-CM | POA: Diagnosis not present

## 2019-02-09 DIAGNOSIS — F1020 Alcohol dependence, uncomplicated: Secondary | ICD-10-CM | POA: Diagnosis not present

## 2019-02-09 DIAGNOSIS — J45909 Unspecified asthma, uncomplicated: Secondary | ICD-10-CM

## 2019-02-09 DIAGNOSIS — F4323 Adjustment disorder with mixed anxiety and depressed mood: Secondary | ICD-10-CM

## 2019-02-09 DIAGNOSIS — F411 Generalized anxiety disorder: Secondary | ICD-10-CM | POA: Diagnosis not present

## 2019-02-09 DIAGNOSIS — N049 Nephrotic syndrome with unspecified morphologic changes: Secondary | ICD-10-CM

## 2019-02-09 DIAGNOSIS — IMO0001 Reserved for inherently not codable concepts without codable children: Secondary | ICD-10-CM

## 2019-02-09 DIAGNOSIS — I151 Hypertension secondary to other renal disorders: Secondary | ICD-10-CM

## 2019-02-09 DIAGNOSIS — J449 Chronic obstructive pulmonary disease, unspecified: Secondary | ICD-10-CM

## 2019-02-09 DIAGNOSIS — J3089 Other allergic rhinitis: Secondary | ICD-10-CM | POA: Insufficient documentation

## 2019-02-09 DIAGNOSIS — Z87891 Personal history of nicotine dependence: Secondary | ICD-10-CM

## 2019-02-09 MED ORDER — ALBUTEROL SULFATE HFA 108 (90 BASE) MCG/ACT IN AERS: 1.0000 | INHALATION_SPRAY | RESPIRATORY_TRACT | 1 refills | Status: DC | PRN

## 2019-02-09 MED ORDER — MONTELUKAST SODIUM 10 MG PO TABS: 10.0000 mg | ORAL_TABLET | Freq: Every day | ORAL | 1 refills | Status: DC

## 2019-02-09 MED ORDER — BUDESONIDE-FORMOTEROL FUMARATE 80-4.5 MCG/ACT IN AERO: 2.0000 | INHALATION_SPRAY | Freq: Two times a day (BID) | RESPIRATORY_TRACT | 3 refills | Status: DC

## 2019-02-09 MED ORDER — CITALOPRAM HYDROBROMIDE 40 MG PO TABS: ORAL_TABLET | ORAL | 0 refills | Status: DC

## 2019-02-09 MED ORDER — ZOLPIDEM TARTRATE ER 12.5 MG PO TBCR: 12.5000 mg | EXTENDED_RELEASE_TABLET | Freq: Every evening | ORAL | 1 refills | Status: DC | PRN
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
## 2019-05-13 ENCOUNTER — Other Ambulatory Visit: Payer: Self-pay | Admitting: Family Medicine

## 2019-05-13 DIAGNOSIS — F411 Generalized anxiety disorder: Secondary | ICD-10-CM

## 2019-05-13 DIAGNOSIS — F4323 Adjustment disorder with mixed anxiety and depressed mood: Secondary | ICD-10-CM

## 2019-06-09 ENCOUNTER — Encounter (INDEPENDENT_AMBULATORY_CARE_PROVIDER_SITE_OTHER): Payer: Medicare Other | Admitting: Family Medicine

## 2019-06-09 ENCOUNTER — Other Ambulatory Visit: Payer: Self-pay

## 2019-06-09 ENCOUNTER — Encounter: Payer: Self-pay | Admitting: Family Medicine

## 2019-06-09 DIAGNOSIS — F4323 Adjustment disorder with mixed anxiety and depressed mood: Secondary | ICD-10-CM | POA: Insufficient documentation

## 2019-06-13 ENCOUNTER — Other Ambulatory Visit: Payer: Self-pay | Admitting: Family Medicine

## 2019-06-13 DIAGNOSIS — F411 Generalized anxiety disorder: Secondary | ICD-10-CM

## 2019-06-13 DIAGNOSIS — F4323 Adjustment disorder with mixed anxiety and depressed mood: Secondary | ICD-10-CM

## 2019-06-14 ENCOUNTER — Other Ambulatory Visit: Payer: Self-pay | Admitting: Family Medicine

## 2019-06-14 DIAGNOSIS — F4323 Adjustment disorder with mixed anxiety and depressed mood: Secondary | ICD-10-CM

## 2019-06-14 DIAGNOSIS — F411 Generalized anxiety disorder: Secondary | ICD-10-CM

## 2019-06-14 MED ORDER — CITALOPRAM HYDROBROMIDE 40 MG PO TABS: ORAL_TABLET | ORAL | 0 refills | Status: DC

## 2019-06-14 NOTE — Telephone Encounter (Signed)
Patient called concerned that his Rx  :  citalopram (CELEXA) 40 MG tablet [id number]   Order Details Dose, Route, Frequency: As Directed  Note to Pharmacy:  Needs appt      Dispense Quantity: 30 tablet Refills: 0 Fills remaining: --        Sig: TAKE 1 TABLET BY MOUTH DAILY. **NEEDS OFFICE VISIT FOR REFILLS**          ----Patient had scheduled appt for George [name] 10/29 but DUE to [place] it had to be cancelled & rescheduled for ( Friday - 06/17/2019)  --Pt says he really needs that medication & ask if provider will contact pharmacy to approve.   Pt uses :  [place], [place] - [place] [phone] (Phone) [phone] (Fax)   --glh

## 2019-06-14 NOTE — Telephone Encounter (Signed)
Sent in 15 pills for patient to make it until Friday. NO FURTHER REFILLS UNTIL PATIENT IS SEEN. Left message to advise patient of this. AS, CMA

## 2019-06-17 ENCOUNTER — Ambulatory Visit (INDEPENDENT_AMBULATORY_CARE_PROVIDER_SITE_OTHER): Payer: Medicare Other | Admitting: Family Medicine

## 2019-06-17 ENCOUNTER — Other Ambulatory Visit: Payer: Self-pay

## 2019-06-17 ENCOUNTER — Encounter: Payer: Self-pay | Admitting: Family Medicine

## 2019-06-17 VITALS — BP 110/80 | HR 80 | Temp 97.3°F | Ht 74.0 in | Wt 251.0 lb

## 2019-06-17 DIAGNOSIS — F411 Generalized anxiety disorder: Secondary | ICD-10-CM | POA: Diagnosis not present

## 2019-06-17 DIAGNOSIS — F5104 Psychophysiologic insomnia: Secondary | ICD-10-CM

## 2019-06-17 DIAGNOSIS — J449 Chronic obstructive pulmonary disease, unspecified: Secondary | ICD-10-CM

## 2019-06-17 DIAGNOSIS — F4323 Adjustment disorder with mixed anxiety and depressed mood: Secondary | ICD-10-CM | POA: Diagnosis not present

## 2019-06-17 DIAGNOSIS — I151 Hypertension secondary to other renal disorders: Secondary | ICD-10-CM

## 2019-06-17 MED ORDER — FLUTICASONE-SALMETEROL 250-50 MCG/DOSE IN AEPB: 1.0000 | INHALATION_SPRAY | Freq: Two times a day (BID) | RESPIRATORY_TRACT | 1 refills | Status: DC

## 2019-06-17 NOTE — Progress Notes (Signed)
Telehealth office visit note for George [name], D.O- at Primary Care at [facility]   I connected with current patient today and verified that I am speaking with the correct person using two identifiers.   . Location of the patient: Home . Location of the provider: Office Only the patient (+/- their family members at pt's discretion) and myself were participating in the encounter - This visit type was conducted due to national recommendations for restrictions regarding the COVID-19 Pandemic (e.g. social distancing) in an effort to limit this patient's exposure and mitigate transmission in our community.  This format is felt to be most appropriate for this patient at this time.   - The patient did not have access to video technology or had technical difficulties with video requiring transitioning to audio format only. - No physical exam could be performed with this format, beyond that communicated to [place] by the patient/ family members as noted.   - Additionally my office staff/ schedulers discussed with the patient that there may be a monetary charge related to this service, depending on their medical insurance.   The patient expressed understanding, and agreed to proceed.       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  - Recent Fall Notes he got a puppy; "he got under my feet and I fell."  Says he hurt his knee, his groin, and his hip; "it wasn't broken or anything, but I bruised myself."  Says he's still trying to get over it.  Notes "still sort of sore" even though nothing's broken; "hobblin' around."  Says "getting a little bit better day by day."  Doesn't believe he needs follow-up because he's "on the uphill."  Overall says "it's been alright; I'm just telling you about it."  - Mood & Sleep "Everything's been good; as far as the anxiety, I went from 20 to 40 (on the Celexa) and I've been good."  Says that the Ambien  "seems like it's done the trick."  Notes "if I forget to take it, I end up taking it 2-3 hours later."  He's sleeping good, "getting up in the mornings fine."  Confirms "Everything's been working out good."  - Breathing/COPD Notes his main concern is his breathing/COPD.  "I get winded real easy, and if I wear my mask and go places or whatever, I get winded even more."  Notes was given a prescription before, but didn't get it filled because it was too expensive.  Notes it was $250 and was told to ask the doctor for something more generic.  Patient states he doesn't have any insurance other than Medicare.  He continues using his rescue inhaler.  - Blood Pressure Notes his blood pressure has been good.  Has a dull headache every so often in the afternoon.  When this happens, notes he takes another half a blood pressure tablet "and in a half an hour everything is fine."  Says this only happens every once in a while.  "I've been feeling pretty good and everything."  - Patient reports good compliance with medication and/or lifestyle modification  - His denies acute concerns or problems related to treatment plan  - He denies new onset of: chest pain, exercise intolerance, shortness of breath, dizziness, visual changes, headache, lower extremity swelling or claudication.   Last 3 blood pressure readings in our office are as follows: BP Readings from Last 3 Encounters:  06/17/19 110/80  06/09/19 115/81  02/09/19 108/78   Filed Weights   06/17/19 0933  Weight: 251 lb (113.9 kg)          GAD 7 : Generalized Anxiety Score 02/09/2019 05/18/2018 12/23/2016 04/23/2016  Nervous, Anxious, on Edge 3 0 2 1  Control/stop worrying 3 0 2 1  Worry too much - different things 3 0 2 1  Trouble relaxing 3 0 2 2  Restless 0 0 0 2  Easily annoyed or irritable 3 0 0 1  Afraid - awful might happen 0 0 0 1  Total GAD 7 Score 15 0 8 9  Anxiety Difficulty Very difficult Not difficult at all Not difficult  at all Somewhat difficult    Depression screen [facility] 2/9 06/17/2019 06/09/2019 02/09/2019 12/29/2018 08/24/2018  Decreased Interest 0 0 3 3 0  Down, Depressed, Hopeless 0 0 0 3 0  PHQ - 2 Score 0 0 3 6 0  Altered sleeping 1 0 3 1 0  Tired, decreased energy 0 3 3 3  0  Change in appetite 1 1 0 0 0  Feeling bad or failure about yourself  0 0 0 0 0  Trouble concentrating 0 0 0 0 0  Moving slowly or fidgety/restless 0 0 0 0 0  Suicidal thoughts 0 0 0 0 0  PHQ-9 Score 2 4 9 10  0  Difficult doing work/chores Not difficult at all Somewhat difficult Very difficult Not difficult at all Not difficult at all  Some recent data might be hidden      Impression and Recommendations:    1. 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 GAD (generalized anxiety disorder)   4. Adjustment reaction with anxiety and depression   5. Psychophysiological insomnia      Recent Fall; Recovering Since - Advised patient to ice areas of pain for 15-20 minutes prudently. - Patient knows he may obtain further referral to specialist PRN. - Will continue to monitor.  COPD, Mixed Type - Per patient, could not afford prescription provided prior. - Discussed options for other medications today.  - As patient does not have insurance, advised patient to use GoodRx card. - Patient will come into clinic to obtain GoodRx card and use to obtain prescriptions PRN.  - Education provided regarding recommendations on obtaining reasonably priced medications without access to insurance.  Lengthy conversation held with patient today.  All questions answered.  - Patient knows to ask what the generic cheapest medicine is for COPD medicines.  - Will continue to monitor.  GAD, Adjustment Reaction w/ Anxiety & Depression - Stable at this time on Celexa. - Increased dose to 40 from 20 a couple of months ago. - Per pt, tolerating increased dose well.  Denies S-E.  - Continue treatment plan as  prescribed.  See med list.  - Will continue to monitor.  Psychophysiological Insomnia - Stable on current management with Ambien. - Per pt, if he does not take the Ambien, he cannot sleep at all. - Tolerating well, denies S-E.  - Continue treatment plan as established. See med list.  - Will continue to monitor.  Secondary Hypertension due to renal disease - Stable at this time. - No changes made to management today. - Continue medications as prescribed. See med list.  - Ongoing ambulatory BP monitoring encouraged.  - Advised patient to avoid use of NSAID's, as these can cause elevated BP and negatively affect the kidneys.  - Will continue  to monitor.  Recommendations - Return near future for Medicare Wellness with full fasting blood work.   - As part of my medical decision making, I reviewed the following data within the [place] History obtained from pt /family, CMA notes reviewed and incorporated if applicable, Labs reviewed, Radiograph/ tests reviewed if applicable and OV notes from prior OV's with me, as well as other specialists she/he has seen since seeing me last, were all reviewed and used in my medical decision making process today.    - Additionally, discussion had with patient regarding our treatment plan, and their biases/concerns about that plan were used in my medical decision making today.    - The patient agreed with the plan and demonstrated an understanding of the instructions.   No barriers to understanding were identified.    - Red flag symptoms and signs discussed in detail.  Patient expressed understanding regarding what to do in case of emergency\ urgent symptoms.   - The patient was advised to call back or seek an in-person evaluation if the symptoms worsen or if the condition fails to improve as anticipated.   Return for medicare wellness near future w/ full fasting blood work.    Meds ordered this encounter  Medications  .  DISCONTD: Fluticasone-Salmeterol (ADVAIR DISKUS) 250-50 MCG/DOSE AEPB    Sig: Inhale 1 puff into the lungs 2 (two) times daily.    Dispense:  1 each    Refill:  1  . Fluticasone-Salmeterol (ADVAIR DISKUS) 250-50 MCG/DOSE AEPB    Sig: Inhale 1 puff into the lungs 2 (two) times daily.    Dispense:  1 each    Refill:  1    Medications Discontinued During This Encounter  Medication Reason  . Fluticasone-Salmeterol (ADVAIR DISKUS) 250-50 MCG/DOSE AEPB       I provided 20++ minutes of non face-to-face time during this encounter.  Additional time was spent with charting and coordination of care after the actual visit commenced.   Note:  This note was prepared with assistance of Dragon voice recognition software. Occasional wrong-word or sound-a-like substitutions may have occurred due to the inherent limitations of voice recognition software.  This document serves as a record of services personally performed by George Dance, DO. It was created on her behalf by Toni Amend, a trained medical scribe. The creation of this record is based on the scribe's personal observations and the provider's statements to them.   This case required medical decision making of at least moderate complexity. The above documentation has been reviewed to be accurate and was completed by Marjory Sneddon, D.O.       Patient Care Team    Relationship Specialty Notifications Start End  George Dance, DO PCP - General Family Medicine  03/03/16   Lorretta Harp, MD Consulting Physician Cardiology  04/05/16   Estanislado Emms, MD Consulting Physician Nephrology  04/05/16   Evans Lance, MD Consulting Physician Clinical Cardiac Electrophysiology  04/05/16    Comment: his  EP  [facility]  Comer, Okey Regal, MD Consulting Physician Infectious Diseases  04/05/16    Comment: Hep C txmnt  Sharmon Revere Physician Assistant Cardiology  04/05/16    Comment: follows pt's fluid status---> L Ext edema   Dillingham, Loel Lofty, DO Attending Physician Plastic Surgery  04/05/16    Comment: Dr. Theodoro Kos   ( plastics and reconstuctive sx at [facility]) sees her in [place] [facility];  she is who has  diagnosed him with Berger's disease     -Vitals obtained; medications/ allergies reconciled;  personal medical, social, Sx etc.histories were updated by CMA, reviewed by me and are reflected in chart   Patient Active Problem List   Diagnosis Date Noted  . HLD (hyperlipidemia) 05/15/2017    Priority: High  . 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 Alcoholism /alcohol abuse- episodic binges 04/05/2016    Priority: High  . 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 Noncompliance with diet and medication regimen 04/05/2016    Priority: High  . Signficant h/o tobacco abuse (150pk/yr hx) 03/03/2016    Priority: High  . h/o Basal cell carcinoma of nose 03/03/2016    Priority: High  . GAD (generalized anxiety disorder) 03/03/2016    Priority: High  . h/o Hepatitis C infection 11/14/2015    Priority: High  . COPD mixed type ([place]) 10/12/2015    Priority: High  . Insomnia 04/05/2016    Priority: Medium  . Berger's disease: IgA Nephropathy  10/12/2015    Priority: Medium  . AAA -   (f/up US 12/2018) 09/24/2015    Priority: Medium  . h/o Hypothyroidism 09/22/2015    Priority: Medium  . Venous insufficiency of both lower extremities 09/21/2015    Priority: Medium  . H/O CHF 08/20/2015    Priority: Medium  . h/o SVT (supraventricular tachycardia) s/p Ablation 05/22/2015    Priority: Medium  . Obesity, Class I, BMI 30-34.9 04/29/2017    Priority: Low  . Elevated blood pressure reading- situational 04/29/2017    Priority: Low  . History of elevated glucose 04/29/2017    Priority: Low  . Colonoscopy refused 04/29/2017    Priority: Low  . Vitamin D deficiency 05/19/2016    Priority: Low  . Liver fibrosis 03/24/2016     Priority: Low  . Adjustment reaction with anxiety and depression 06/09/2019  . Reactive airway disease without complication [id number]  . Environmental and seasonal allergies 02/09/2019  . Abnormal skin growth- R inner nasal lacrimal fold 05/18/2018  . Excessive use of nonsteroidal anti-inflammatory drug (NSAID) 01/17/2018  . Chronic Right leg pain-  pt w/ history of ORIF femur 30-40 years ago increasing pain for 1 year, terrible pain 3 months 01/17/2018  . Insomnia due to anxiety and fear 09/15/2017  . Positive for macroalbuminuria 05/15/2017  . Low level of high density lipoprotein (HDL) 05/15/2017  . Hypertriglyceridemia 05/15/2017     Current Meds  Medication Sig  . acetaminophen (TYLENOL) 500 MG tablet Take 500 mg by mouth every 6 (six) hours as needed.  Marland Kitchen albuterol (VENTOLIN HFA) 108 (90 Base) MCG/ACT inhaler Inhale 1-2 puffs into the lungs every 4 (four) hours as needed for wheezing or shortness of breath.  Marland Kitchen aspirin EC 81 MG EC tablet Take 1 tablet (81 mg total) by mouth daily.  . Cholecalciferol (VITAMIN D3) 5000 units CAPS Take 1 capsule by mouth daily.  . citalopram (CELEXA) 40 MG tablet TAKE 1 TABLET BY MOUTH DAILY. **NEEDS OFFICE VISIT FOR REFILLS**  . losartan (COZAAR) 50 MG tablet Take 1 tablet (50 mg total) by mouth daily.  Marland Kitchen zolpidem (AMBIEN CR) 12.5 MG CR tablet Take 1 tablet (12.5 mg total) by mouth at bedtime as needed. for sleep     Allergies:  No Known Allergies   ROS:  See above HPI for pertinent positives and negatives   Objective:   Blood pressure 110/80, pulse  80, temperature (!) 97.3 F (36.3 C), temperature source Oral, height 6\' 2"  (1.88 m), weight 251 lb (113.9 kg).  (if some vitals are omitted, this means that patient was UNABLE to obtain them even though they were asked to get them prior to OV today.  They were asked to call us at their earliest convenience with these once obtained. )  General: A & O * 3; sounds in no acute distress; in usual  state of health.  Skin: Pt confirms warm and dry extremities and pink fingertips HEENT: Pt confirms lips non-cyanotic Chest: Patient confirms normal chest excursion and movement Respiratory: speaking in full sentences, no conversational dyspnea; patient confirms no use of accessory muscles Psych: insight appears good, mood- appears full

## 2019-06-27 ENCOUNTER — Other Ambulatory Visit: Payer: Self-pay | Admitting: Family Medicine

## 2019-06-27 DIAGNOSIS — F4323 Adjustment disorder with mixed anxiety and depressed mood: Secondary | ICD-10-CM

## 2019-06-27 DIAGNOSIS — F411 Generalized anxiety disorder: Secondary | ICD-10-CM

## 2019-07-25 ENCOUNTER — Other Ambulatory Visit: Payer: Self-pay | Admitting: Family Medicine

## 2019-07-25 DIAGNOSIS — J449 Chronic obstructive pulmonary disease, unspecified: Secondary | ICD-10-CM

## 2019-07-25 DIAGNOSIS — I151 Hypertension secondary to other renal disorders: Secondary | ICD-10-CM

## 2019-07-25 DIAGNOSIS — N049 Nephrotic syndrome with unspecified morphologic changes: Secondary | ICD-10-CM

## 2019-07-25 DIAGNOSIS — J45909 Unspecified asthma, uncomplicated: Secondary | ICD-10-CM

## 2019-07-25 DIAGNOSIS — J3089 Other allergic rhinitis: Secondary | ICD-10-CM

## 2019-08-17 ENCOUNTER — Other Ambulatory Visit: Payer: Self-pay | Admitting: Family Medicine

## 2019-08-17 DIAGNOSIS — F4323 Adjustment disorder with mixed anxiety and depressed mood: Secondary | ICD-10-CM

## 2019-08-17 DIAGNOSIS — F5104 Psychophysiologic insomnia: Secondary | ICD-10-CM

## 2019-08-17 NOTE — Telephone Encounter (Signed)
Patient was last seen on 06/17/19 and was told to follow up in near future for Medicare wellness. Patient has apt scheduled for 08/25/2019 at 845am.  Ambien given 02/09/19 90 tabs with 1 RF.   AS, CMA

## 2019-08-18 ENCOUNTER — Other Ambulatory Visit: Payer: Self-pay | Admitting: Family Medicine

## 2019-08-18 DIAGNOSIS — F4323 Adjustment disorder with mixed anxiety and depressed mood: Secondary | ICD-10-CM

## 2019-08-18 DIAGNOSIS — F5104 Psychophysiologic insomnia: Secondary | ICD-10-CM

## 2019-08-18 NOTE — Telephone Encounter (Signed)
Patient was seen on 06/17/19 and was told to follow up: Return for medicare wellness near future w/ full fasting blood work.

## 2019-08-18 NOTE — Telephone Encounter (Signed)
Patient called to make sure pharmacy sent [place] refill request on :   zolpidem (AMBIEN CR) 12.5 MG CR tablet [id number]   Order Details Dose: 12.5 mg Route: Oral Frequency: At bedtime PRN  Dispense Quantity: 90 tablet Refills: 1   Indications of Use: Insomnia      Sig: Take 1 tablet (12.5 mg total) by mouth at bedtime as needed. for sleep     --Forwarding request to med asst that if approved send to :   Preferred Pharmacies      [place], [place] - [address] [phone] (Phone) [phone] (Fax   --glh

## 2019-08-24 ENCOUNTER — Other Ambulatory Visit: Payer: Self-pay

## 2019-08-24 DIAGNOSIS — Z8639 Personal history of other endocrine, nutritional and metabolic disease: Secondary | ICD-10-CM

## 2019-08-24 DIAGNOSIS — E785 Hyperlipidemia, unspecified: Secondary | ICD-10-CM

## 2019-08-24 DIAGNOSIS — E559 Vitamin D deficiency, unspecified: Secondary | ICD-10-CM

## 2019-08-24 DIAGNOSIS — E781 Pure hyperglyceridemia: Secondary | ICD-10-CM

## 2019-08-24 DIAGNOSIS — I151 Hypertension secondary to other renal disorders: Secondary | ICD-10-CM

## 2019-08-24 DIAGNOSIS — IMO0001 Reserved for inherently not codable concepts without codable children: Secondary | ICD-10-CM

## 2019-08-24 DIAGNOSIS — F1020 Alcohol dependence, uncomplicated: Secondary | ICD-10-CM

## 2019-08-25 ENCOUNTER — Encounter: Payer: Self-pay | Admitting: Family Medicine

## 2019-08-25 ENCOUNTER — Ambulatory Visit (INDEPENDENT_AMBULATORY_CARE_PROVIDER_SITE_OTHER): Payer: Medicare Other | Admitting: Family Medicine

## 2019-08-25 ENCOUNTER — Other Ambulatory Visit: Payer: Self-pay

## 2019-08-25 ENCOUNTER — Other Ambulatory Visit: Payer: Medicare Other

## 2019-08-25 VITALS — BP 115/91 | Temp 96.2°F | Ht 74.0 in | Wt 250.0 lb

## 2019-08-25 DIAGNOSIS — B1920 Unspecified viral hepatitis C without hepatic coma: Secondary | ICD-10-CM

## 2019-08-25 DIAGNOSIS — Z0000 Encounter for general adult medical examination without abnormal findings: Secondary | ICD-10-CM | POA: Diagnosis not present

## 2019-08-25 DIAGNOSIS — I151 Hypertension secondary to other renal disorders: Secondary | ICD-10-CM

## 2019-08-25 DIAGNOSIS — Z5320 Procedure and treatment not carried out because of patient's decision for unspecified reasons: Secondary | ICD-10-CM | POA: Diagnosis not present

## 2019-08-25 DIAGNOSIS — Z2882 Immunization not carried out because of caregiver refusal: Secondary | ICD-10-CM

## 2019-08-25 DIAGNOSIS — F1020 Alcohol dependence, uncomplicated: Secondary | ICD-10-CM

## 2019-08-25 DIAGNOSIS — E785 Hyperlipidemia, unspecified: Secondary | ICD-10-CM

## 2019-08-25 DIAGNOSIS — Z87891 Personal history of nicotine dependence: Secondary | ICD-10-CM | POA: Diagnosis not present

## 2019-08-25 DIAGNOSIS — E559 Vitamin D deficiency, unspecified: Secondary | ICD-10-CM

## 2019-08-25 DIAGNOSIS — Z8639 Personal history of other endocrine, nutritional and metabolic disease: Secondary | ICD-10-CM

## 2019-08-25 DIAGNOSIS — E781 Pure hyperglyceridemia: Secondary | ICD-10-CM

## 2019-08-25 DIAGNOSIS — I7143 Infrarenal abdominal aortic aneurysm, without rupture: Secondary | ICD-10-CM

## 2019-08-25 DIAGNOSIS — I714 Abdominal aortic aneurysm, without rupture: Secondary | ICD-10-CM | POA: Diagnosis not present

## 2019-08-25 DIAGNOSIS — IMO0001 Reserved for inherently not codable concepts without codable children: Secondary | ICD-10-CM

## 2019-08-25 NOTE — Progress Notes (Signed)
Subjective:   George [name] is a 69 y.o. male who presents for Medicare Annual/Subsequent preventive examination.  HPI: Notes he's been so-so. He fell due to an accident back in November (two months ago, around November 4, his puppy "got under his feet") and notes "it hasn't gotten any better; I don't think it's a bone thing, I think it's more a muscle thing in my left thigh and left groin." States he spoke with [place] about these symptoms prior and was advised at the time to follow up with specialist, but he refused this referral at that time. However, now he thinks he needs further assessment. Says "I can't walk straight. If I lay on the couch, I'm pretty comfortable, but if I get up to walk anywhere, to the bathroom, outside or anything, it hurts." He is having trouble putting on his shoes and clothes. Regarding the pain, "it's concerning me. I thought it would go away in a week or two but it hasn't." Notes no better but no worse. "I'm trying to be a tough guy." Denies concerns about falls otherwise. States "I'm not unsteady; I just fell because of the pooch." His current difficulties with movement and ambulation are related to his recent fall only. Denies red flags such as loss of bladder or bowel control. Denies neurological concerns or signs of damage to spine. Per patient, regarding colonoscopy and several screenings, "I don't care for one." Denies family history of colon cancer. He quit smoking 4-5 years ago. Prior to that, he smoked for over 30 years, at least a pack per day. Denies concerns with hearing or memory problems.   Objective:    Vitals: BP (!) 115/91   Temp (!) 96.2 F (35.7 C) (Oral)   Ht 6\' 2"  (1.88 m)   Wt 250 lb (113.4 kg)   BMI 32.10 kg/m   Body mass index is 32.1 kg/m.  Advanced Directives 04/29/2017 03/03/2016 11/26/2015 09/26/2015 09/10/2015 08/22/2015 08/20/2015  Does Patient Have a Medical Advance Directive? Yes Yes No Yes No No No  Type of Research officer, political party of [place];Living will [place];Living will - - - - -  Does patient want to make changes to medical advance directive? - - - - - - -  Copy of [place] in Chart? - - - - - - -  Would patient like information on creating a medical advance directive? - - - - No - patient declined information No - patient declined information No - patient declined information    Tobacco Social History   Tobacco Use  Smoking Status Former Smoker  . Packs/day: 2.00  . Years: 50.00  . Pack years: 100.00  . Types: Cigarettes  . Quit date: 01/24/2015  . Years since quitting: 4.5  Smokeless Tobacco Never Used     Counseling given: Not Answered    Past Medical History:  Diagnosis Date  . AAA (abdominal aortic aneurysm) ([place])    a. [place] 09/2015 - 3cm distal AAA. //  b. [place] 5/17: 3.1 cm   . Basal cell carcinoma of right side of nose   . Berger's disease   . CKD (chronic kidney disease) 09/2015  . GERD (gastroesophageal reflux disease)   . Nephrotic syndrome   . Panic attacks   . SVT (supraventricular tachycardia) ([place]) 05/22/2015    Past Surgical History:  Procedure Laterality Date  . BASAL CELL CARCINOMA EXCISION Right ~ 06/2015   "side of my nose"  .  ELECTROPHYSIOLOGIC STUDY N/A 09/10/2015   Procedure: SVT Ablation;  Surgeon: Evans Lance, MD;  Location: [facility];  Service: Cardiovascular;  Laterality: N/A;  . FEMUR FRACTURE SURGERY Right 1970   "had plate & pin put in"  . FEMUR HARDWARE REMOVAL Right 1971   "removed plate; left pin in  . FRACTURE SURGERY    . KNEE ARTHROSCOPY Right ~ 1985  . TONSILLECTOMY AND ADENOIDECTOMY  ~ 1955    Family History  Problem Relation Age of Onset  . Heart disease Mother        Before age 81  . Heart attack Mother   . Cancer Mother        lung and colon  . Stroke Mother   . Heart failure Father   . Heart disease Father        After age 39  . Heart attack Father   . Hyperlipidemia Father   . Healthy  Maternal Grandmother   . Healthy Maternal Grandfather   . Diabetes Paternal Grandmother   . Healthy Paternal Grandfather   . Hypertension Neg Hx     Social History   Socioeconomic History  . Marital status: Divorced    Spouse name: Not on file  . Number of children: 0  . Years of education: 64  . Highest education level: Not on file  Occupational History  . Occupation: Retired  Tobacco Use  . Smoking status: Former Smoker    Packs/day: 2.00    Years: 50.00    Pack years: 100.00    Types: Cigarettes    Quit date: 01/24/2015    Years since quitting: 4.5  . Smokeless tobacco: Never Used  Substance and Sexual Activity  . Alcohol use: Yes    Alcohol/week: 3.0 - 4.0 standard drinks    Types: 3 - 4 Shots of liquor per week  . Drug use: No  . Sexual activity: Not Currently  Other Topics Concern  . Not on file  Social History Narrative   Fun: Trade out work, outdoors   Denies religious beliefs effecting health care.    Social Determinants of Health   Financial Resource Strain:   . Difficulty of Paying Living Expenses: Not on file  Food Insecurity:   . Worried About Charity fundraiser in the Last Year: Not on file  . Ran Out of Food in the Last Year: Not on file  Transportation Needs:   . Lack of Transportation (Medical): Not on file  . Lack of Transportation (Non-Medical): Not on file  Physical Activity:   . Days of Exercise per Week: Not on file  . Minutes of Exercise per Session: Not on file  Stress:   . Feeling of Stress : Not on file  Social Connections:   . Frequency of Communication with Friends and Family: Not on file  . Frequency of Social Gatherings with Friends and Family: Not on file  . Attends Religious Services: Not on file  . Active Member of Clubs or Organizations: Not on file  . Attends Archivist Meetings: Not on file  . Marital Status: Not on file    Outpatient Encounter Medications as of 08/25/2019  Medication Sig  . acetaminophen  (TYLENOL) 500 MG tablet Take 500 mg by mouth every 6 (six) hours as needed.  Marland Kitchen albuterol (VENTOLIN HFA) 108 (90 Base) MCG/ACT inhaler Inhale 1-2 puffs into the lungs every 4 (four) hours as needed for wheezing or shortness of breath.  Marland Kitchen aspirin EC  81 MG EC tablet Take 1 tablet (81 mg total) by mouth daily.  . Cholecalciferol (VITAMIN D3) 5000 units CAPS Take 1 capsule by mouth daily.  . citalopram (CELEXA) 40 MG tablet TAKE 1 TABLET BY MOUTH DAILY.  Marland Kitchen losartan (COZAAR) 50 MG tablet TAKE 1 TABLET (50 MG TOTAL) BY MOUTH DAILY.  . montelukast (SINGULAIR) 10 MG tablet TAKE 1 TABLET BY MOUTH AT BEDTIME. FOR BREATHING/ALLERGIES  . zolpidem (AMBIEN CR) 12.5 MG CR tablet TAKE 1 TABLET BY MOUTH AT BEDTIME AS NEEDED FOR SLEEP  . Fluticasone-Salmeterol (ADVAIR DISKUS) 250-50 MCG/DOSE AEPB Inhale 1 puff into the lungs 2 (two) times daily. (Patient not taking: Reported on 08/25/2019)   No facility-administered encounter medications on file as of 08/25/2019.    Activities of Daily Living In your present state of health, do you have any difficulty performing the following activities: 08/25/2019 06/09/2019  Hearing? N N  Vision? N N  Difficulty concentrating or making decisions? N N  Walking or climbing stairs? Y Y  Dressing or bathing? Y Y  Doing errands, shopping? [place]  Some recent data might be hidden    Patient Care Team: [name], DO as PCP - General (Family Medicine) Lorretta Harp, MD as Consulting Physician (Cardiology) Estanislado Emms, MD as Consulting Physician (Nephrology) Evans Lance, MD as Consulting Physician (Clinical Cardiac Electrophysiology) Comer, Okey Regal, MD as Consulting Physician (Infectious Diseases) Sharmon Revere as Physician Assistant (Cardiology) Dillingham, Loel Lofty, DO as Attending Physician (Plastic Surgery)     Assessment:     This is a routine wellness examination for [place].   Goals   None     Fall Risk Fall Risk  08/25/2019 06/17/2019  06/09/2019 08/24/2018 05/18/2018  Falls in the past year? 1 1 1  0 No  Number falls in past yr: 0 0 0 - -  Injury with Fall? 1 1 1  - -  Risk Factor Category  - - - - -  Risk for fall due to : - - - - -  Follow up Falls evaluation completed Falls evaluation completed - Falls evaluation completed -    Is the patient's home free of loose throw rugs in walkways, pet beds, electrical cords, etc?   yes      Grab bars in the bathroom? yes      Handrails on the stairs?   yes      Adequate lighting?   yes   Timed Get Up and Go Performed: Telehealth   Depression Screen PHQ 2/9 Scores 08/25/2019 06/17/2019 06/09/2019 02/09/2019  PHQ - 2 Score 0 0 0 3  PHQ- 9 Score 0 2 4 9     Cognitive Function MMSE - Mini Mental State Exam 01/31/2015  Orientation to time 5  Orientation to Place 5  Registration 3  Attention/ Calculation 5  Recall 3  Language- name 2 objects 2  Language- repeat 1  Language- follow 3 step command 3  Language- read & follow direction 1  Write a sentence 1  Copy design 1  Total score 30      6CIT Screen 08/25/2019 04/29/2017  What Year? 0 points 0 points  What month? 0 points 0 points  What time? 0 points 0 points  Count back from 20 0 points 0 points  Months in reverse 0 points 0 points  Repeat phrase 4 points 0 points  Total Score 4 0    Immunization History  Administered Date(s) Administered  . Hep A / Hep  B 04/29/2017  . Pneumococcal Conjugate-13 04/29/2017  . Pneumococcal Polysaccharide-23 08/23/2015  . Tdap 01/31/2015    Qualifies for Shingles Vaccine? Patient declines Shingrix Patient is UTD on Tdap vaccine Patient declines influenza vaccine. Patient is UTD on pneumococcal vaccine.    Screening Tests Health Maintenance  Topic Date Due  . INFLUENZA VACCINE  11/09/2019 (Originally 03/12/2019)  . COLONOSCOPY  02/09/2020 (Originally 05/10/1999)  . TETANUS/TDAP  01/30/2025  . Hepatitis C Screening  Completed  . PNA vac Low Risk Adult  Completed     Cancer Screenings: Lung: Low Dose CT Chest recommended if Age 53-80 years, 30 pack-year currently smoking OR have quit w/in 15years. Patient does qualify. Patient declines Patient qualifies for AAA screening-patient declines.  Colorectal: needed-never performed per patient. Patient declines.   Additional Screenings:  Hepatitis C Screening: done 06/26/2016 HIV Screening: Patient declines       Plan:      PLAN: - Advised patient to call clinic and schedule in-person evaluation for complaints of ongoing pain and difficulty after fall in November. Told patient to call sooner in the future to obtain follow-up. - Reviewed recommendations for immunizations and screenings per guidelines today. Education provided to patient today regarding critical need to obtain immunizations and screenings as recommended, to help reduce health risks. - Advised patient of critical need for colon cancer screening, with possibility of significant health risks if patient declines. Patient declines colonoscopy. Discussed alternative screening option of ColoGuard. Patient declines. - Patient with long history of smoking and 3.1 cm infrarenal AAA found on prior imaging in May of 2017, with follow-up ultrasound recommended in three years. Patient was due for additional imaging in May of 2020. Education provided to patient today regarding critical need for follow-up as advised, but patient declines AAA screening today. - Given patient's history of smoking, discussed need for low-dose CT lung cancer screening. Patient declines. - Need for shingles vaccination. Patient declines shingles vaccination. - Need for influenza vaccination. Patient declines influenza vaccination. - Declines Hep C/HIV screen today; per patient, has obtained this screening in past. - Lab work obtained this morning.     Return for f/up near future to discuss pain from recent fall, and recent lab work.  I have personally reviewed and noted the following  in the patient's chart:   . Medical and social history . Use of alcohol, tobacco or illicit drugs  . Current medications and supplements . Functional ability and status . Nutritional status . Physical activity . Advanced directives . List of other physicians . Hospitalizations, surgeries, and ER visits in previous 12 months . Vitals . Screenings to include cognitive, depression, and falls . Referrals and appointments  In addition, I have reviewed and discussed with patient certain preventive protocols, quality metrics, and best practice recommendations. A written personalized care plan for preventive services as well as general preventive health recommendations were provided to patient.     [name], DO  08/25/2019

## 2019-08-26 LAB — COMPREHENSIVE METABOLIC PANEL
ALT: 6 IU/L (ref 0–44)
AST: 14 IU/L (ref 0–40)
Albumin/Globulin Ratio: 2 (ref 1.2–2.2)
Albumin: 4.7 g/dL (ref 3.8–4.8)
Alkaline Phosphatase: 80 IU/L (ref 39–117)
BUN/Creatinine Ratio: 14 (ref 10–24)
BUN: 18 mg/dL (ref 8–27)
Bilirubin Total: 0.2 mg/dL (ref 0.0–1.2)
CO2: 22 mmol/L (ref 20–29)
Calcium: 9.5 mg/dL (ref 8.6–10.2)
Chloride: 101 mmol/L (ref 96–106)
Creatinine, Ser: 1.26 mg/dL (ref 0.76–1.27)
GFR calc Af Amer: 66 mL/min/{1.73_m2} (ref >59)
GFR calc non Af Amer: 57 mL/min/{1.73_m2} — ABNORMAL LOW (ref >59)
Globulin, Total: 2.4 g/dL (ref 1.5–4.5)
Glucose: 90 mg/dL (ref 65–99)
Potassium: 4.9 mmol/L (ref 3.5–5.2)
Sodium: 137 mmol/L (ref 134–144)
Total Protein: 7.1 g/dL (ref 6.0–8.5)

## 2019-08-26 LAB — TSH: TSH: 2.92 u[IU]/mL (ref 0.450–4.500)

## 2019-08-26 LAB — T4, FREE: Free T4: 0.84 ng/dL (ref 0.82–1.77)

## 2019-08-26 LAB — CBC WITH DIFFERENTIAL/PLATELET
Basophils Absolute: 0.1 10*3/uL (ref 0.0–0.2)
Basos: 2 %
EOS (ABSOLUTE): 0.1 10*3/uL (ref 0.0–0.4)
Eos: 1 %
Hematocrit: 42.7 % (ref 37.5–51.0)
Hemoglobin: 13.9 g/dL (ref 13.0–17.7)
Immature Grans (Abs): 0 10*3/uL (ref 0.0–0.1)
Immature Granulocytes: 1 %
Lymphocytes Absolute: 1.1 10*3/uL (ref 0.7–3.1)
Lymphs: 17 %
MCH: 26.6 pg (ref 26.6–33.0)
MCHC: 32.6 g/dL (ref 31.5–35.7)
MCV: 82 fL (ref 79–97)
Monocytes Absolute: 0.4 10*3/uL (ref 0.1–0.9)
Monocytes: 7 %
Neutrophils Absolute: 4.6 10*3/uL (ref 1.4–7.0)
Neutrophils: 72 %
Platelets: 290 10*3/uL (ref 150–450)
RBC: 5.22 x10E6/uL (ref 4.14–5.80)
RDW: 14.3 % (ref 11.6–15.4)
WBC: 6.3 10*3/uL (ref 3.4–10.8)

## 2019-08-26 LAB — VITAMIN D 25 HYDROXY (VIT D DEFICIENCY, FRACTURES): Vit D, 25-Hydroxy: 29.7 ng/mL — ABNORMAL LOW (ref 30.0–100.0)

## 2019-08-26 LAB — LIPID PANEL
Chol/HDL Ratio: 5.1 ratio — ABNORMAL HIGH (ref 0.0–5.0)
Cholesterol, Total: 179 mg/dL (ref 100–199)
HDL: 35 mg/dL — ABNORMAL LOW (ref >39)
LDL Chol Calc (NIH): 109 mg/dL — ABNORMAL HIGH (ref 0–99)
Triglycerides: 199 mg/dL — ABNORMAL HIGH (ref 0–149)
VLDL Cholesterol Cal: 35 mg/dL (ref 5–40)

## 2019-08-26 LAB — HEMOGLOBIN A1C
Est. average glucose Bld gHb Est-mCnc: 103 mg/dL
Hgb A1c MFr Bld: 5.2 % (ref 4.8–5.6)

## 2019-08-26 LAB — T3: T3, Total: 102 ng/dL (ref 71–180)

## 2019-08-30 ENCOUNTER — Ambulatory Visit (INDEPENDENT_AMBULATORY_CARE_PROVIDER_SITE_OTHER): Payer: Medicare Other | Admitting: Family Medicine

## 2019-08-30 ENCOUNTER — Other Ambulatory Visit: Payer: Self-pay

## 2019-08-30 ENCOUNTER — Encounter: Payer: Self-pay | Admitting: Family Medicine

## 2019-08-30 VITALS — BP 166/98 | HR 100 | Temp 98.1°F | Resp 18 | Ht 74.0 in | Wt 243.2 lb

## 2019-08-30 DIAGNOSIS — M25552 Pain in left hip: Secondary | ICD-10-CM

## 2019-08-30 DIAGNOSIS — I151 Hypertension secondary to other renal disorders: Secondary | ICD-10-CM | POA: Diagnosis not present

## 2019-08-30 DIAGNOSIS — E559 Vitamin D deficiency, unspecified: Secondary | ICD-10-CM | POA: Diagnosis not present

## 2019-08-30 DIAGNOSIS — F411 Generalized anxiety disorder: Secondary | ICD-10-CM

## 2019-08-30 DIAGNOSIS — Z9181 History of falling: Secondary | ICD-10-CM | POA: Diagnosis not present

## 2019-08-30 DIAGNOSIS — E781 Pure hyperglyceridemia: Secondary | ICD-10-CM

## 2019-08-30 DIAGNOSIS — E785 Hyperlipidemia, unspecified: Secondary | ICD-10-CM

## 2019-08-30 MED ORDER — HYDROCODONE-ACETAMINOPHEN 7.5-325 MG PO TABS: 1.0000 | ORAL_TABLET | Freq: Three times a day (TID) | ORAL | 0 refills | Status: DC | PRN

## 2019-08-30 MED ORDER — ROSUVASTATIN CALCIUM 40 MG PO TABS: 20.0000 mg | ORAL_TABLET | Freq: Every day | ORAL | 0 refills | Status: DC

## 2019-08-30 NOTE — Progress Notes (Signed)
Impression and Recommendations:    1. Left hip pain-  7-8/10 Worse with wt bearing   2. History of fall onto L hip 68mo ago- concern for fracture   3. New onset hyperlipidemia, now requiring medications   4. Hypertriglyceridemia   5. Vitamin D deficiency   6. 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 GAD (generalized anxiety disorder)     Left Hip Pain - History of Fall Onto L Hip 2 Months Ago, Concern for Fracture - Discussed patient's symptoms during examination. - Discussed critical need for urgent assessment by orthopedics as soon as possible. - Patient declines referral today. Says he can not go tonight. - Reviewed options for referral tomorrow. - Stat ambulatory referral to orthopedics.  - Prescription Vicodin provided today to tide the patient over until he sees orthopedic specialist.  Advised patient to take 1-2 pills at night for pain.   ---> Once patient is seen by specialist, they will take over the patient's care and treatment plan of hip, including pain management.  - Will continue to monitor.   Secondary Hypertension due to Renal Disease - Exacerbated due to Pain/ Injury - BP elevated in office today; controlled at home  - Per patient, BP at home runs 140/90 or less.  - Patient will continue current treatment regimen.  See med list.  - Counseled patient on pathophysiology of disease and discussed various treatment options, which always includes dietary and lifestyle modification as first line.   - Lifestyle changes such as dash and heart healthy diets and engaging in a regular exercise program discussed extensively with patient.   - Ambulatory blood pressure monitoring encouraged at least 3 times weekly.  Keep log and bring in every office visit.  Reminded patient that if they ever feel poorly in any way, to check their blood pressure and pulse.  - We will continue to monitor.   Hyperlipidemia, Hypertriglyceridemia,  Low HDL - Last FLP obtained five days ago.  - HDL = 35, low, but stable from 33 prior. - LDL = 109, elevated. - Triglycerides = 199, up from 142 prior. The 10-year ASCVD risk score Mikey Bussing DC Jr., et al., 2013) is: 34.1%  - Cholesterol levels are elevated at this time. - Discussed critical need to begin medication. - Advised starting statin today. - Begin Crestor.  See med list. - Begin half-tablet at bedtime, 20 mg.  - To help reduce incidence of side-effects on statin, advised patient to drink plenty of water and be as physically active as possible.  - Prudent dietary changes such as low saturated & trans fat diets for hyperlipidemia and low carb diets for hypertriglyceridemia discussed with patient.    - To improve HDL, encouraged patient to follow AHA guidelines for regular exercise and also engage in weight loss if BMI above 25.   - Educational handouts provided at patient's desire and/ or told to look online at the [employer] website for further information.  We will continue to monitor   Vitamin D Deficiency - Last measured at 29.7, low.  - Begin once weekly Vitamin D supplementation. - Advised patient to obtain a pillbox to organize his pills for the week.  - Will continue to monitor and re-check as discussed.   Recommendations - Advised patient to obtain urgent follow up with orthopedics ASAP as advised. - Return for CMP- lab only- in 6 weeks after starting statin,  - ALSO - FLP  in 2 months with ov 3 d later   Orders Placed This Encounter  Procedures  . Lipid panel  . Comprehensive metabolic panel    Meds ordered this encounter  Medications  . HYDROcodone-acetaminophen (NORCO) 7.5-325 MG tablet    Sig: Take 1 tablet by mouth every 8 (eight) hours as needed for moderate pain (cough).    Dispense:  30 tablet    Refill:  0  . rosuvastatin (CRESTOR) 40 MG tablet    Sig: Take 0.5 tablets (20 mg total) by mouth at bedtime.    Dispense:  45 tablet     Refill:  0    Gross side effects, risk and benefits, and alternatives of medications and treatment plan in general discussed with patient.  Patient is aware that all medications have potential side effects and we are unable to predict every side effect or drug-drug interaction that may occur.   Patient will call with any questions prior to using medication if they have concerns.    Expresses verbal understanding and consents to current therapy and treatment regimen.  No barriers to understanding were identified.  Red flag symptoms and signs discussed in detail.  Patient expressed understanding regarding what to do in case of emergency\urgent symptoms  Please see AVS handed out to patient at the end of our visit for further patient instructions/ counseling done pertaining to today's office visit.   Return for f/up 6 weeks for CMP, 2 months check FLP then OV 3 d later since started crestor.     Note:  This note was prepared with assistance of Dragon voice recognition software. Occasional wrong-word or sound-a-like substitutions may have occurred due to the inherent limitations of voice recognition software.   This document serves as a record of services personally performed by [name], DO. It was created on her behalf by Toni Amend, a trained medical scribe. The creation of this record is based on the scribe's personal observations and the provider's statements to them.    This case required medical decision making of at least moderate complexity. The above documentation has been reviewed to be accurate and was completed by Marjory Sneddon, D.O.    --------------------------------------------------------------------------------------------------------------------------------------------------------------------------------------   Subjective:    George [name], am serving as scribe for Dr. [name].    HPI: George [name] is a 69 y.o. male who  presents to [place] at [facility] today for issues as discussed below.  - Pain in Leg and Knee after Fall in November His dog is small and went underneath him, causing him to fall.  Notes this occurred at the hearth near the fireplace / wood stove, and "I believe I hit that hearth with my hip."  Notes today "I think it's my muscles."  He feels the complaint in his groin and thigh muscle in particular, and "my knee, if I stand up and move a little bit, it will pop."  Notes he has noticed swelling in the area.  States some of the swelling hasn't occurred until after he fell.  He mainly feels the pain in his thigh and groin, and around the inside of his knee.  Patient visibly shakes and can barely stand during appointment.  He isn't sure if he broke anything, but states "I may have."  He followed up with [place] long ago in the past, and does not wish to return there.  Denies numbness or tingling in the legs; states "I've got too much feeling" in  the legs.  Says he can get dressed, get to the truck, and get back again.  His brother has been helping take care of the patient.   HPI:  Hypertension:  -  His blood pressure at home has been running:  Normal overall.  States he took a couple of tylenol before he drove up here and isn't sure if the tylenol affected his blood pressure.  Notes his BP typically runs less than 140/around 90, with a heart rate of 85.  Notes "not above 140/90."  Notes he feels his COPD is exacerbated by wearing his mask.  - Patient reports good compliance with medication and/or lifestyle modification  - His denies acute concerns or problems related to treatment plan  - He denies new onset of: chest pain, exercise intolerance, shortness of breath, dizziness, visual changes, headache, lower extremity swelling or claudication.    Last 3 blood pressure readings in our office are as follows: BP Readings from Last 3 Encounters:    08/30/19 (!) 166/98  08/25/19 (!) 115/91  06/17/19 110/80   Filed Weights   08/30/19 1555  Weight: 243 lb 3.2 oz (110.3 kg)    Wt Readings from Last 3 Encounters:  08/30/19 243 lb 3.2 oz (110.3 kg)  08/25/19 250 lb (113.4 kg)  06/17/19 251 lb (113.9 kg)   BP Readings from Last 3 Encounters:  08/30/19 (!) 166/98  08/25/19 (!) 115/91  06/17/19 110/80   Pulse Readings from Last 3 Encounters:  08/30/19 100  06/17/19 80  06/09/19 84   BMI Readings from Last 3 Encounters:  08/30/19 31.23 kg/m  08/25/19 32.10 kg/m  06/17/19 32.23 kg/m     Patient Care Team    Relationship Specialty Notifications Start End  [name], DO PCP - General Family Medicine  03/03/16   Lorretta Harp, MD Consulting Physician Cardiology  04/05/16   Estanislado Emms, MD Consulting Physician Nephrology  04/05/16   Evans Lance, MD Consulting Physician Clinical Cardiac Electrophysiology  04/05/16    Comment: his  EP  [facility]  Comer, Okey Regal, MD Consulting Physician Infectious Diseases  04/05/16    Comment: Hep C txmnt  Liliane Shi, PA-C Physician Assistant Cardiology  04/05/16    Comment: follows pt's fluid status---> L Ext edema  Dillingham, Loel Lofty, DO Attending Physician Plastic Surgery  04/05/16    Comment: Dr. Lyndee Leo Sanger   ( plastics and reconstuctive sx at [facility]) sees her in [place] at [facility];  she is who has diagnosed him with Berger's disease     Patient Active Problem List   Diagnosis Date Noted  . HLD (hyperlipidemia) 05/15/2017  . 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 Alcoholism /alcohol abuse- episodic binges 04/05/2016  . 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 Noncompliance with diet and medication regimen 04/05/2016  . Signficant h/o tobacco abuse (150pk/yr hx) 03/03/2016  . h/o Basal cell carcinoma of nose 03/03/2016  . GAD (generalized anxiety disorder) 03/03/2016  . h/o Hepatitis C infection 11/14/2015  . COPD mixed  type ([place]) 10/12/2015  . Insomnia 04/05/2016  . Berger's disease: IgA Nephropathy  10/12/2015  . Aneurysm of infrarenal abdominal aorta ([place]) 09/24/2015  . h/o Hypothyroidism 09/22/2015  . Venous insufficiency of both lower extremities 09/21/2015  . H/O CHF 08/20/2015  . h/o SVT (supraventricular tachycardia) s/p Ablation 05/22/2015  . Obesity, Class I, BMI 30-34.9 04/29/2017  . Elevated blood pressure reading- situational 04/29/2017  . History of elevated glucose  04/29/2017  . Colonoscopy refused 04/29/2017  . Vitamin D deficiency 05/19/2016  . Liver fibrosis 03/24/2016  . Vaccination declined by pt 08/25/2019  . Adjustment reaction with anxiety and depression 06/09/2019  . Reactive airway disease without complication [id number]  . Environmental and seasonal allergies 02/09/2019  . Abnormal skin growth- R inner nasal lacrimal fold 05/18/2018  . Excessive use of nonsteroidal anti-inflammatory drug (NSAID) 01/17/2018  . Chronic Right leg pain-  pt w/ history of ORIF femur 30-40 years ago increasing pain for 1 year, terrible pain 3 months 01/17/2018  . Insomnia due to anxiety and fear 09/15/2017  . Positive for macroalbuminuria 05/15/2017  . Low level of high density lipoprotein (HDL) 05/15/2017  . Hypertriglyceridemia 05/15/2017    Past Medical history, Surgical history, Family history, Social history, Allergies and Medications have been entered into the medical record, reviewed and changed as needed.    Current Meds  Medication Sig  . acetaminophen (TYLENOL) 500 MG tablet Take 500 mg by mouth every 6 (six) hours as needed.  Marland Kitchen albuterol (VENTOLIN HFA) 108 (90 Base) MCG/ACT inhaler Inhale 1-2 puffs into the lungs every 4 (four) hours as needed for wheezing or shortness of breath.  Marland Kitchen aspirin EC 81 MG EC tablet Take 1 tablet (81 mg total) by mouth daily.  . Cholecalciferol (VITAMIN D3) 5000 units CAPS Take 1 capsule by mouth daily.  . citalopram (CELEXA) 40 MG tablet TAKE 1 TABLET BY  MOUTH DAILY.  Marland Kitchen losartan (COZAAR) 50 MG tablet TAKE 1 TABLET (50 MG TOTAL) BY MOUTH DAILY.  . montelukast (SINGULAIR) 10 MG tablet TAKE 1 TABLET BY MOUTH AT BEDTIME. FOR BREATHING/ALLERGIES  . zolpidem (AMBIEN CR) 12.5 MG CR tablet TAKE 1 TABLET BY MOUTH AT BEDTIME AS NEEDED FOR SLEEP    Allergies:  No Known Allergies   Review of Systems:  A fourteen system review of systems was performed and found to be positive as per HPI.   Objective:   Blood pressure (!) 166/98, pulse 100, temperature 98.1 F (36.7 C), temperature source Oral, resp. rate 18, height 6\' 2"  (1.88 m), weight 243 lb 3.2 oz (110.3 kg), SpO2 94 %. Body mass index is 31.23 kg/m.   General:  Well Developed, well nourished, appropriate for stated age.  Patient in acute distress today due to pain from his hip Neuro:  Alert and oriented,  extra-ocular muscles intact  HEENT:  Normocephalic, atraumatic, neck supple, no carotid bruits appreciated  Skin:  no gross rash, warm, pink. Respiratory:  Normal chest excursion Vascular:  Ext warm, no cyanosis apprec.; cap RF less 2 sec. Psych:  No HI/SI, judgement and insight good, Euthymic mood. Full Affect. Musculoskeletal: antalgic gait with severe dysfunction with moderate to severe amount of pain with ambulation. Hip: Patient is exquisitely tender to palpation over his greater trochanter of the hip and over hip joint. Leg: Left thigh especially the medial distal aspect with moderate to large amount of swelling and tenderness to palpation. Knee: Patient is tender to palpation in the medial and lateral aspects of his distal femur there is pain in the hip with flexion extension of his knee with minimal edema around the knee joint.  Patella nonballotable with mild crepitus appreciated with flexion extension

## 2019-08-30 NOTE — Patient Instructions (Signed)
Please see the handout we gave you on [facility].  We want she did be there first thing in the morning as this is a walk-in urgent care orthopedic clinic.  Please use the pain meds as instructed and as I discussed further pain management will be per the orthopedist.  Please try to avoid any weightbearing activity as much as possible until you are seen tomorrow

## 2019-08-31 ENCOUNTER — Telehealth: Payer: Self-pay | Admitting: Family Medicine

## 2019-08-31 DIAGNOSIS — M1612 Unilateral primary osteoarthritis, left hip: Secondary | ICD-10-CM | POA: Diagnosis not present

## 2019-08-31 NOTE — Telephone Encounter (Signed)
Pt needed clarification on directions,says Dr. Jenetta Downer told him to go to [facility] for his hip pain & he thought he needed a referral--- Per Dorothea Ogle no she told him to go in through their "Acute Care Walk in Clinic " (advised pt on how to proceed) & that no appt is necessary @ the Walk-in clinic.  --FYI to med staff  --glh

## 2019-08-31 NOTE — Telephone Encounter (Signed)
Noted. AS, CMA 

## 2019-09-13 ENCOUNTER — Other Ambulatory Visit: Payer: Self-pay | Admitting: Orthopedic Surgery

## 2019-09-13 DIAGNOSIS — M1612 Unilateral primary osteoarthritis, left hip: Secondary | ICD-10-CM | POA: Diagnosis not present

## 2019-09-14 ENCOUNTER — Other Ambulatory Visit (HOSPITAL_COMMUNITY): Payer: Self-pay | Admitting: *Deleted

## 2019-09-14 NOTE — Care Plan (Signed)
Ortho Bundle Case Management Note  Patient Details  Name: RAELYN SISNEROS MRN: [id number] Date of Birth: 1951-02-16   Patient plans to discharge to home with friends to assist. HHPT referral to [facility]. OPPT set up at [facility]. Rolling walker ordered from [place].  Patient and MD in agreement with plan. Choice offered.             DME Arranged:  [facility] Rile rolling DME Agency:  [facility]  HH Arranged:  PT [place] Agency:  [facility]  Additional Comments: Please contact me with any questions of if this plan should need to change.  Ladell Heads,  [place] [facility]  [phone] 09/14/2019, 11:53 AM

## 2019-09-14 NOTE — Patient Instructions (Signed)
DUE TO COVID-19 ONLY ONE VISITOR IS ALLOWED TO COME WITH YOU AND STAY IN THE WAITING ROOM ONLY DURING PRE OP AND PROCEDURE DAY OF SURGERY. THE 1 VISITOR MAY VISIT WITH YOU AFTER SURGERY IN YOUR PRIVATE ROOM DURING VISITING HOURS ONLY!   ONCE YOUR COVID TEST IS COMPLETED, PLEASE BEGIN THE QUARANTINE INSTRUCTIONS AS OUTLINED IN YOUR HANDOUT.                George [name]     Your procedure is scheduled on: Monday 09/19/2019   Report to [facility] Main  Entrance    Report to admitting at 0730  AM     Call this number [name] you have problems the morning of surgery [phone]    Remember: Do not eat food  :After Midnight.     NO SOLID FOOD AFTER MIDNIGHT THE NIGHT PRIOR TO SURGERY AND  NOTHING BY MOUTH EXCEPT CLEAR LIQUIDS UNTIL 0700 am .     PLEASE FINISH ENSURE DRINK PER SURGEON ORDER  WHICH NEEDS TO BE COMPLETED AT  0700 am .   CLEAR LIQUID DIET   Foods Allowed                                                                     Foods Excluded  Coffee and tea, regular and decaf                             liquids that you cannot  Plain Jell-O any favor except red or purple                                           see through such as: Fruit ices (not with fruit pulp)                                     milk, soups, orange juice  Iced Popsicles                                    All solid food Carbonated beverages, regular and diet                                    Cranberry, grape and apple juices Sports drinks like Gatorade Lightly seasoned clear broth or consume(fat free) Sugar, honey syrup  Sample Menu Breakfast                                Lunch                                     Supper Cranberry juice                    Beef  broth                            Chicken broth Jell-O                                     Grape juice                           Apple juice Coffee or tea                        Jell-O                                      Popsicle                                            Coffee or tea                        Coffee or tea  _____________________________________________________________________     BRUSH YOUR TEETH MORNING OF SURGERY AND RINSE YOUR MOUTH OUT, NO CHEWING GUM CANDY OR MINTS.     Take these medicines the morning of surgery with A SIP OF WATER: Citalopram (Celexa)                                 You may not have any metal on your body including hair pins and              piercings  Do not wear jewelry, make-up, lotions, powders or perfumes, deodorant                        Men may shave face and neck.   Do not bring valuables to the hospital. [place].  Contacts, dentures or bridgework may not be worn into surgery.  Leave suitcase in the car. After surgery it may be brought to your room.     Patients discharged the day of surgery will not be allowed to drive home. [name] YOU ARE HAVING SURGERY AND GOING HOME THE SAME DAY, YOU MUST HAVE AN ADULT TO DRIVE YOU HOME AND  BE WITH YOU FOR 24 HOURS. YOU MAY GO HOME BY TAXI OR UBER OR ORTHERWISE, BUT AN ADULT MUST ACCOMPANY YOU HOME AND STAY WITH YOU FOR 24 HOURS.  Name and phone number of your driver:                Please read over the following fact sheets you were given: _____________________________________________________________________             [facility] - Preparing for Surgery Before surgery, you can play an important role.  Because skin is not sterile, your skin needs to be as free of germs as possible.  You can reduce the number of germs on your skin by washing with CHG (chlorahexidine gluconate) soap before surgery.  CHG is an  antiseptic cleaner which kills germs and bonds with the skin to continue killing germs even after washing. Please DO NOT use [name] you have an allergy to CHG or antibacterial soaps.  [name] your skin becomes reddened/irritated stop using the CHG and inform your nurse when you  arrive at Short Stay. Do not shave (including legs and underarms) for at least 48 hours prior to the first CHG shower.  You may shave your face/neck. Please follow these instructions carefully:  1.  Shower with CHG Soap the night before surgery and the  morning of Surgery.  2.  [name] you choose to wash your hair, wash your hair first as usual with your  normal  shampoo.  3.  After you shampoo, rinse your hair and body thoroughly to remove the  shampoo.                           4.  Use CHG as you would any other liquid soap.  You can apply chg directly  to the skin and wash                       Gently with a scrungie or clean washcloth.  5.  Apply the CHG Soap to your body ONLY FROM THE NECK DOWN.   Do not use on face/ open                           Wound or open sores. Avoid contact with eyes, ears mouth and genitals (private parts).                       Wash face,  Genitals (private parts) with your normal soap.             6.  Wash thoroughly, paying special attention to the area where your surgery  will be performed.  7.  Thoroughly rinse your body with warm water from the neck down.  8.  DO NOT shower/wash with your normal soap after using and rinsing off  the CHG Soap.                9.  Pat yourself dry with a clean towel.            10.  Wear clean pajamas.            11.  Place clean sheets on your bed the night of your first shower and do not  sleep with pets. Day of Surgery : Do not apply any lotions/deodorants the morning of surgery.  Please wear clean clothes to the hospital/surgery center.  FAILURE TO FOLLOW THESE INSTRUCTIONS MAY RESULT IN THE CANCELLATION OF YOUR SURGERY PATIENT SIGNATURE_________________________________  NURSE SIGNATURE__________________________________  ________________________________________________________________________   George [name]  An incentive spirometer is a tool that can help keep your lungs clear and active. This tool measures how  well you are filling your lungs with each breath. Taking long deep breaths may help reverse or decrease the chance of developing breathing (pulmonary) problems (especially infection) following:  A long period of time when you are unable to move or be active. BEFORE THE PROCEDURE   [name] the spirometer includes an indicator to show your best effort, your nurse or respiratory therapist will set it to a desired goal.  [name] possible, sit up straight or lean slightly forward. Try not to slouch.  Hold the  incentive spirometer in an upright position. INSTRUCTIONS FOR USE  1. Sit on the edge of your bed [name] possible, or sit up as far as you can in bed or on a chair. 2. Hold the incentive spirometer in an upright position. 3. Breathe out normally. 4. Place the mouthpiece in your mouth and seal your lips tightly around it. 5. Breathe in slowly and as deeply as possible, raising the piston or the ball toward the top of the column. 6. Hold your breath for 3-5 seconds or for as long as possible. Allow the piston or ball to fall to the bottom of the column. 7. Remove the mouthpiece from your mouth and breathe out normally. 8. Rest for a few seconds and repeat Steps 1 through 7 at least 10 times every 1-2 hours when you are awake. Take your time and take a few normal breaths between deep breaths. 9. The spirometer may include an indicator to show your best effort. Use the indicator as a goal to work toward during each repetition. 10. After each set of 10 deep breaths, practice coughing to be sure your lungs are clear. [name] you have an incision (the cut made at the time of surgery), support your incision when coughing by placing a pillow or rolled up towels firmly against it. Once you are able to get out of bed, walk around indoors and cough well. You may stop using the incentive spirometer when instructed by your caregiver.  RISKS AND COMPLICATIONS  Take your time so you do not get dizzy or light-headed.  [name] you  are in pain, you may need to take or ask for pain medication before doing incentive spirometry. It is harder to take a deep breath [name] you are having pain. AFTER USE  Rest and breathe slowly and easily.  It can be helpful to keep track of a log of your progress. Your caregiver can provide you with a simple table to help with this. [name] you are using the spirometer at home, follow these instructions: George [name]:   You are having difficultly using the spirometer.  You have trouble using the spirometer as often as instructed.  Your pain medication is not giving enough relief while using the spirometer.  You develop fever of 100.5 F (38.1 C) or higher. SEEK IMMEDIATE MEDICAL CARE [name]:   You cough up bloody sputum that had not been present before.  You develop fever of 102 F (38.9 C) or greater.  You develop worsening pain at or near the incision site. MAKE SURE YOU:   Understand these instructions.  Will watch your condition.  Will get help right away [name] you are not doing well or get worse. Document Released: 12/08/2006 Document Revised: 10/20/2011 Document Reviewed: 02/08/2007 Exit[employer] Patient Information 2014 Exit[employer], [place].   ________________________________________________________________________  WHAT IS A BLOOD TRANSFUSION? Blood Transfusion Information  A transfusion is the replacement of blood or some of its parts. Blood is made up of multiple cells which provide different functions.  Red blood cells carry oxygen and are used for blood loss replacement.  White blood cells fight against infection.  Platelets control bleeding.  Plasma helps clot blood.  Other blood products are available for specialized needs, such as hemophilia or other clotting disorders. BEFORE THE TRANSFUSION  Who gives blood for transfusions?   Healthy volunteers who are fully evaluated to make sure their blood is safe. This is blood bank blood. Transfusion therapy is the safest  it has ever been in  the practice of medicine. Before blood is taken from a donor, a complete history is taken to make sure that person has no history of diseases nor engages in risky social behavior (examples are intravenous drug use or sexual activity with multiple partners). The donor's travel history is screened to minimize risk of transmitting infections, such as malaria. The donated blood is tested for signs of infectious diseases, such as HIV and hepatitis. The blood is then tested to be sure it is compatible with you in order to minimize the chance of a transfusion reaction. [name] you or a relative donates blood, this is often done in anticipation of surgery and is not appropriate for emergency situations. It takes many days to process the donated blood. RISKS AND COMPLICATIONS Although transfusion therapy is very safe and saves many lives, the main dangers of transfusion include:   Getting an infectious disease.  Developing a transfusion reaction. This is an allergic reaction to something in the blood you were given. Every precaution is taken to prevent this. The decision to have a blood transfusion has been considered carefully by your caregiver before blood is given. Blood is not given unless the benefits outweigh the risks. AFTER THE TRANSFUSION  Right after receiving a blood transfusion, you will usually feel much better and more energetic. This is especially true [name] your red blood cells have gotten low (anemic). The transfusion raises the level of the red blood cells which carry oxygen, and this usually causes an energy increase.  The nurse administering the transfusion will monitor you carefully for complications. HOME CARE INSTRUCTIONS  No special instructions are needed after a transfusion. You may find your energy is better. Speak with your caregiver about any limitations on activity for underlying diseases you may have. SEEK MEDICAL CARE [name]:   Your condition is not improving after  your transfusion.  You develop redness or irritation at the intravenous (IV) site. SEEK IMMEDIATE MEDICAL CARE [name]:  Any of the following symptoms occur over the next 12 hours:  Shaking chills.  You have a temperature by mouth above 102 F (38.9 C), not controlled by medicine.  Chest, back, or muscle pain.  People around you feel you are not acting correctly or are confused.  Shortness of breath or difficulty breathing.  Dizziness and fainting.  You get a rash or develop hives.  You have a decrease in urine output.  Your urine turns a dark color or changes to pink, red, or brown. Any of the following symptoms occur over the next 10 days:  You have a temperature by mouth above 102 F (38.9 C), not controlled by medicine.  Shortness of breath.  Weakness after normal activity.  The white part of the eye turns yellow (jaundice).  You have a decrease in the amount of urine or are urinating less often.  Your urine turns a dark color or changes to pink, red, or brown. Document Released: 07/25/2000 Document Revised: 10/20/2011 Document Reviewed: 03/13/2008 [facility] Patient Information 2014 [place], [place].  _______________________________________________________________________

## 2019-09-15 ENCOUNTER — Other Ambulatory Visit (HOSPITAL_COMMUNITY)
Admission: RE | Admit: 2019-09-15 | Discharge: 2019-09-15 | Disposition: A | Discharge status: Home / Self Care | Payer: Medicare Other | Source: Ambulatory Visit | Attending: Orthopedic Surgery | Admitting: Orthopedic Surgery

## 2019-09-15 DIAGNOSIS — Z20822 Contact with and (suspected) exposure to covid-19: Secondary | ICD-10-CM | POA: Insufficient documentation

## 2019-09-15 DIAGNOSIS — M1612 Unilateral primary osteoarthritis, left hip: Secondary | ICD-10-CM | POA: Diagnosis present

## 2019-09-15 DIAGNOSIS — Z01812 Encounter for preprocedural laboratory examination: Secondary | ICD-10-CM | POA: Diagnosis not present

## 2019-09-15 LAB — SARS CORONAVIRUS 2 (TAT 6-24 HRS): SARS Coronavirus 2: NEGATIVE

## 2019-09-15 NOTE — H&P (Signed)
TOTAL HIP ADMISSION H&P  Patient is admitted for left total hip arthroplasty.  Subjective:  Chief Complaint: left hip pain  HPI: George [name], 69 y.o. male, has a history of pain and functional disability in the left hip(s) due to arthritis and patient has failed non-surgical conservative treatments for greater than 12 weeks to include NSAID's and/or analgesics, use of assistive devices and activity modification.  Onset of symptoms was abrupt starting 1 years ago with gradually worsening course since that time.The patient noted no past surgery on the left hip(s).  Patient currently rates pain in the left hip at 10 out of 10 with activity. Patient has night pain, worsening of pain with activity and weight bearing, trendelenberg gait, pain that interfers with activities of daily living and pain with passive range of motion. Patient has evidence of periarticular osteophytes and joint space narrowing by imaging studies. This condition presents safety issues increasing the risk of falls.    There is no current active infection.  Patient Active Problem List   Diagnosis Date Noted  . Vaccination declined by pt 08/25/2019  . Adjustment reaction with anxiety and depression 06/09/2019  . Reactive airway disease without complication [id number]  . Environmental and seasonal allergies 02/09/2019  . Abnormal skin growth- R inner nasal lacrimal fold 05/18/2018  . Excessive use of nonsteroidal anti-inflammatory drug (NSAID) 01/17/2018  . Chronic Right leg pain-  pt w/ history of ORIF femur 30-40 years ago increasing pain for 1 year, terrible pain 3 months 01/17/2018  . Insomnia due to anxiety and fear 09/15/2017  . Positive for macroalbuminuria 05/15/2017  . HLD (hyperlipidemia) 05/15/2017  . Low level of high density lipoprotein (HDL) 05/15/2017  . Hypertriglyceridemia 05/15/2017  . Obesity, Class I, BMI 30-34.9 04/29/2017  . Elevated blood pressure reading- situational 04/29/2017  . History of  elevated glucose 04/29/2017  . Colonoscopy refused 04/29/2017  . Vitamin D deficiency 05/19/2016  . 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 Alcoholism /alcohol abuse- episodic binges 04/05/2016  . 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 Insomnia 04/05/2016  . Noncompliance with diet and medication regimen 04/05/2016  . Liver fibrosis 03/24/2016  . Signficant h/o tobacco abuse (150pk/yr hx) 03/03/2016  . h/o Basal cell carcinoma of nose 03/03/2016  . GAD (generalized anxiety disorder) 03/03/2016  . h/o Hepatitis C infection 11/14/2015  . Berger's disease: IgA Nephropathy  10/12/2015  . COPD mixed type ([place]) 10/12/2015  . Aneurysm of infrarenal abdominal aorta ([place]) 09/24/2015  . h/o Hypothyroidism 09/22/2015  . Venous insufficiency of both lower extremities 09/21/2015  . H/O CHF 08/20/2015  . h/o SVT (supraventricular tachycardia) s/p Ablation 05/22/2015   Past Medical History:  Diagnosis Date  . AAA (abdominal aortic aneurysm) ([place])    a. [place] 09/2015 - 3cm distal AAA. //  b. [place] 5/17: 3.1 cm   . Basal cell carcinoma of right side of nose   . Berger's disease   . CKD (chronic kidney disease) 09/2015  . GERD (gastroesophageal reflux disease)   . Nephrotic syndrome   . Panic attacks   . SVT (supraventricular tachycardia) ([place]) 05/22/2015    Past Surgical History:  Procedure Laterality Date  . BASAL CELL CARCINOMA EXCISION Right ~ 06/2015   "side of my nose"  . ELECTROPHYSIOLOGIC STUDY N/A 09/10/2015   Procedure: SVT Ablation;  Surgeon: Evans Lance, MD;  Location: [facility];  Service: Cardiovascular;  Laterality: N/A;  . FEMUR FRACTURE SURGERY Right 1970   "had  plate & pin put in"  . FEMUR HARDWARE REMOVAL Right 1971   "removed plate; left pin in  . FRACTURE SURGERY    . KNEE ARTHROSCOPY Right ~ 1985  . TONSILLECTOMY AND ADENOIDECTOMY  ~ 1955    No current facility-administered medications for this encounter.   Current Outpatient  Medications  Medication Sig Dispense Refill Last Dose  . acetaminophen (TYLENOL) 500 MG tablet Take 500 mg by mouth every 6 (six) hours as needed.     Marland Kitchen aspirin EC 81 MG EC tablet Take 1 tablet (81 mg total) by mouth daily.     . Cholecalciferol (VITAMIN D3) 5000 units CAPS Take 5,000 Units by mouth daily.      . citalopram (CELEXA) 40 MG tablet TAKE 1 TABLET BY MOUTH DAILY. (Patient taking differently: Take 40 mg by mouth daily. ) 90 tablet 0   . HYDROcodone-acetaminophen (NORCO) 7.5-325 MG tablet Take 1 tablet by mouth every 8 (eight) hours as needed for moderate pain (cough). (Patient taking differently: Take 1 tablet by mouth at bedtime. ) 30 tablet 0   . losartan (COZAAR) 50 MG tablet TAKE 1 TABLET (50 MG TOTAL) BY MOUTH DAILY. 90 tablet 3   . montelukast (SINGULAIR) 10 MG tablet TAKE 1 TABLET BY MOUTH AT BEDTIME. FOR BREATHING/ALLERGIES 90 tablet 1   . rosuvastatin (CRESTOR) 40 MG tablet Take 0.5 tablets (20 mg total) by mouth at bedtime. 45 tablet 0   . zolpidem (AMBIEN CR) 12.5 MG CR tablet TAKE 1 TABLET BY MOUTH AT BEDTIME AS NEEDED FOR SLEEP (Patient taking differently: Take 12.5 mg by mouth at bedtime as needed (sleep.). ) 90 tablet 1   . albuterol (VENTOLIN HFA) 108 (90 Base) MCG/ACT inhaler Inhale 1-2 puffs into the lungs every 4 (four) hours as needed for wheezing or shortness of breath. (Patient not taking: Reported on 09/13/2019) 14 g 1 Not Taking at Unknown time  . Fluticasone-Salmeterol (ADVAIR DISKUS) 250-50 MCG/DOSE AEPB Inhale 1 puff into the lungs 2 (two) times daily. (Patient not taking: Reported on 08/25/2019) 1 each 1    No Known Allergies  Social History   Tobacco Use  . Smoking status: Former Smoker    Packs/day: 2.00    Years: 50.00    Pack years: 100.00    Types: Cigarettes    Quit date: 01/24/2015    Years since quitting: 4.6  . Smokeless tobacco: Never Used  Substance Use Topics  . Alcohol use: Yes    Alcohol/week: 3.0 - 4.0 standard drinks    Types: 3 - 4  Shots of liquor per week    Family History  Problem Relation Age of Onset  . Heart disease Mother        Before age 41  . Heart attack Mother   . Cancer Mother        lung and colon  . Stroke Mother   . Heart failure Father   . Heart disease Father        After age 59  . Heart attack Father   . Hyperlipidemia Father   . Healthy Maternal Grandmother   . Healthy Maternal Grandfather   . Diabetes Paternal Grandmother   . Healthy Paternal Grandfather   . Hypertension Neg Hx      Review of Systems  Constitutional: Negative.   HENT: Negative.   Eyes: Negative.   Respiratory:       COPD  Cardiovascular: Negative.   Gastrointestinal: Negative.   Genitourinary: Negative.   Musculoskeletal: Positive  for arthralgias.  Skin: Negative.   Allergic/Immunologic: Negative.   Neurological: Negative.   Hematological: Negative.   Psychiatric/Behavioral: Negative.     Objective:  Physical Exam  Constitutional: He is oriented to person, place, and time. He appears well-developed and well-nourished.  HENT:  Head: Atraumatic.  Eyes: Pupils are equal, round, and reactive to light.  Cardiovascular: Intact distal pulses.  Respiratory: Effort normal.  Musculoskeletal:        General: Tenderness present.     Cervical back: Normal range of motion and neck supple.     Comments: Patient walks a profound left-sided limp.  Any attempts at internal extra rotation of the left hip cause severe pain he can internally rotate to -5 externally rotate to 20.    Neurological: He is alert and oriented to person, place, and time.  Skin: Skin is warm and dry.  Psychiatric: He has a normal mood and affect. His behavior is normal. Judgment and thought content normal.    Vital signs in last 24 hours:    Labs:   Estimated body mass index is 31.23 kg/m as calculated from the following:   Height as of 08/30/19: 6\' 2"  (1.88 m).   Weight as of 08/30/19: 110.3 kg.   Imaging Review Plain radiographs  demonstrate  erosive arthritis with partial collapse of the femoral head and some early protrusio.   Assessment/Plan:  End stage arthritis, left hip(s)  The patient history, physical examination, clinical judgement of the provider and imaging studies are consistent with end stage degenerative joint disease of the left hip(s) and total hip arthroplasty is deemed medically necessary. The treatment options including medical management, injection therapy, arthroscopy and arthroplasty were discussed at length. The risks and benefits of total hip arthroplasty were presented and reviewed. The risks due to aseptic loosening, infection, stiffness, dislocation/subluxation,  thromboembolic complications and other imponderables were discussed.  The patient acknowledged the explanation, agreed to proceed with the plan and consent was signed. Patient is being admitted for inpatient treatment for surgery, pain control, PT, OT, prophylactic antibiotics, VTE prophylaxis, progressive ambulation and ADL's and discharge planning.The patient is planning to be discharged home with home health services    Patient's anticipated LOS is less than 2 midnights, meeting these requirements: - Younger than 33 - Lives within 1 hour of care - Has a competent adult at home to recover with post-op recover - NO history of  - Chronic pain requiring opiods  - Diabetes  - Coronary Artery Disease  - Heart failure  - Heart attack  - Stroke  - DVT/VTE  - Cardiac arrhythmia  - Respiratory Failure/COPD  - Renal failure  - Anemia  - Advanced Liver disease

## 2019-09-16 ENCOUNTER — Encounter (HOSPITAL_COMMUNITY)
Admission: RE | Admit: 2019-09-16 | Discharge: 2019-09-16 | Disposition: A | Discharge status: Home / Self Care | Payer: Medicare Other | Source: Ambulatory Visit | Attending: Orthopedic Surgery | Admitting: Orthopedic Surgery

## 2019-09-16 ENCOUNTER — Encounter: Payer: Self-pay | Admitting: Family Medicine

## 2019-09-16 ENCOUNTER — Other Ambulatory Visit: Payer: Self-pay

## 2019-09-16 ENCOUNTER — Encounter (HOSPITAL_COMMUNITY): Admission: RE | Admit: 2019-09-16 | Payer: Medicare Other | Source: Ambulatory Visit

## 2019-09-16 ENCOUNTER — Ambulatory Visit (INDEPENDENT_AMBULATORY_CARE_PROVIDER_SITE_OTHER): Payer: Medicare Other | Admitting: Family Medicine

## 2019-09-16 VITALS — BP 115/83 | HR 88 | Temp 97.8°F | Resp 16 | Ht 74.0 in | Wt 241.4 lb

## 2019-09-16 DIAGNOSIS — I151 Hypertension secondary to other renal disorders: Secondary | ICD-10-CM

## 2019-09-16 DIAGNOSIS — E785 Hyperlipidemia, unspecified: Secondary | ICD-10-CM | POA: Diagnosis not present

## 2019-09-16 DIAGNOSIS — I714 Abdominal aortic aneurysm, without rupture: Secondary | ICD-10-CM

## 2019-09-16 DIAGNOSIS — M25552 Pain in left hip: Secondary | ICD-10-CM

## 2019-09-16 DIAGNOSIS — Z87891 Personal history of nicotine dependence: Secondary | ICD-10-CM | POA: Diagnosis not present

## 2019-09-16 DIAGNOSIS — I7143 Infrarenal abdominal aortic aneurysm, without rupture: Secondary | ICD-10-CM

## 2019-09-16 DIAGNOSIS — I471 Supraventricular tachycardia: Secondary | ICD-10-CM | POA: Diagnosis not present

## 2019-09-16 DIAGNOSIS — E559 Vitamin D deficiency, unspecified: Secondary | ICD-10-CM | POA: Diagnosis not present

## 2019-09-16 DIAGNOSIS — Z01818 Encounter for other preprocedural examination: Secondary | ICD-10-CM | POA: Diagnosis not present

## 2019-09-16 DIAGNOSIS — N049 Nephrotic syndrome with unspecified morphologic changes: Secondary | ICD-10-CM

## 2019-09-16 LAB — EKG 12-LEAD

## 2019-09-16 MED ORDER — VITAMIN D (ERGOCALCIFEROL) 1.25 MG (50000 UNIT) PO CAPS: ORAL_CAPSULE | ORAL | 3 refills | Status: DC

## 2019-09-16 NOTE — Progress Notes (Signed)
Impression and Recommendations:    1. Left hip pain   2. 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 Signficant h/o tobacco abuse (150pk/yr hx)   4. 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 Hyperlipidemia, unspecified hyperlipidemia type   6. Aneurysm of infrarenal abdominal aorta (HCC)   7. h/o SVT (supraventricular tachycardia) s/p Ablation   8. Preop examination   9. Vitamin D deficiency      Need for cardiac clearance.  Patient is not optimized from a medical and cardiac standpoint.  Please evaluate his cardiac status due to history of SVT, status post-ablation, and AAA from cardiology, with whom patient was lost to follow-up with since 2017.   -Urgent referral to cardiology placed, and he will also need CMP, CBC prior to surgery, besides other necessary labs per surgery, due to his multiple medical problems.   Surgical Clearance - Left Hip Pain - Discussed patient's chronic health conditions during appointment today. - Education provided regarding need for surgical clearance prior to sedation. - Discussed risks of anesthesia and need for patient's health to be optimized prior. - Advised that surgery causes significant stress to the body.  All questions answered.  - Reviewed patient history of CKD, AAA, history of SVT ablation. - Last echocardiogram obtained in 2 of 2017.    - Discussed need for cardiac clearance prior to surgery.   - Ambulatory referral to cardiology placed today.  See orders.  - Patient has seen Dr. [name] in the past.  - Last labs drawn 3 weeks ago on 08/25/2019, and serum creatinine was 1.26, WNL for the patient. - Per patient, obtaining additional labs at [facility]  - Liver function WNL.  - Lengthy conversation held with patient to educate him regarding expectations for surgery, anesthesia, etc.  All questions answered.  - Will continue to monitor closely.   Secondary Hypertension due to Renal Disease - Blood pressure  improved on re-check in office today. -  Stable at this time. At GOAL - Will continue to monitor.  - Serum Crt at goal/ stable when last checked- approx 2 wks ago   Hyperlipidemia - Cholesterol suboptimally controlled last check. - Triglycerides elevated at 199, HDL low at 35 (unchanged from prior), and LDL elevated at 109.  - Patient began Crestor after recent blood work (08/25/2019). - Prescription sent on 08/30/2019.  - Advised patient to continue Crestor as recently prescribed.  See med list. - Will re-check cholesterol 8-10 weeks since last check.   Vitamin D Deficiency - Once weekly Vitamin D prescribed today.   Patient was interviewed and evaluated for coordination of care by me/ [facility] staff members in the clinic today for 50+ minutes, with over 50% of my time spent in face to face counseling of patients various medical conditions, treatment plans of those medical conditions including medicine management and lifestyle modification, strategies to improve health and well being; and in coordination of care.   SEE TREATMENT PLAN FOR DETAILS   Orders Placed This Encounter  Procedures  . Ambulatory referral to Cardiology  . EKG 12-Lead    Meds ordered this encounter  Medications  . Vitamin D, Ergocalciferol, (DRISDOL) 1.25 MG (50000 UNIT) CAPS capsule    Sig: Take one tablet wkly    Dispense:  12 capsule    Refill:  3    Medications Discontinued During This Encounter  Medication Reason  . Fluticasone-Salmeterol (ADVAIR DISKUS) 250-50 MCG/DOSE AEPB Error     Gross  side effects, risk and benefits, and alternatives of medications and treatment plan in general discussed with patient.  Patient is aware that all medications have potential side effects and we are unable to predict every side effect or drug-drug interaction that may occur.   Patient will call with any questions prior to using medication if they have concerns.    Expresses verbal understanding and consents to  current therapy and treatment regimen.  No barriers to understanding were identified.  Red flag symptoms and signs discussed in detail.  Patient expressed understanding regarding what to do in case of emergency\urgent symptoms  Please see AVS handed out to patient at the end of our visit for further patient instructions/ counseling done pertaining to today's office visit.   Follow-up:    - Return for CMP- lab only- in 6 weeks after starting statin on 08/30/19,  - ALSO - FLP in 2 months after starting statin/ crestor with ov 3 d later   Note:  This note was prepared with assistance of Systems analyst. Occasional wrong-word or sound-a-like substitutions may have occurred due to the inherent limitations of voice recognition software.   This document serves as a record of services personally performed by [name], DO. It was created on her behalf by Toni Amend, a trained medical scribe. The creation of this record is based on the scribe's personal observations and the provider's statements to them.   This case required medical decision making of at least moderate complexity. The above documentation from Toni Amend, medical scribe, has been reviewed by Marjory Sneddon, D.O.      --------------------------------------------------------------------------------------------------------------------------------------------------------------------------------------------------------------------------------------------    Subjective:     HPI: George [name] is a 69 y.o. male who presents to [place] [facility] today for issues as discussed below.   - Surgical Clearance Last surgical procedure was in 2017; notes he had an ablation / cauterization where "I was semi in and semi out."  Notes he can walk up a flight of stairs fine while carrying groceries if not for his hip pain.  Denies concerns with his breathing and COPD.  Denies  chest pain, SOB.  Denies difficulty lying flat or breathing at night. No new LE swelling.  Denies known family history of problems with anesthesia. Patient has never had local anesthesia or spinal anesthesia before.  Notes he is taking his Crestor as prescribed.  He has continued taking 5000 IU's Vitamin D daily, but has not begun once-weekly prescription yet.   Wt Readings from Last 3 Encounters:  09/16/19 241 lb 6.4 oz (109.5 kg)  08/30/19 243 lb 3.2 oz (110.3 kg)  08/25/19 250 lb (113.4 kg)   BP Readings from Last 3 Encounters:  09/16/19 115/83  08/30/19 (!) 166/98  08/25/19 (!) 115/91   Pulse Readings from Last 3 Encounters:  09/16/19 88  08/30/19 100  06/17/19 80   BMI Readings from Last 3 Encounters:  09/16/19 30.99 kg/m  08/30/19 31.23 kg/m  08/25/19 32.10 kg/m     Patient Care Team    Relationship Specialty Notifications Start End  [name], DO PCP - General Family Medicine  03/03/16   Lorretta Harp, MD Consulting Physician Cardiology  04/05/16   Estanislado Emms, MD Consulting Physician Nephrology  04/05/16   Evans Lance, MD Consulting Physician Clinical Cardiac Electrophysiology  04/05/16    Comment: his  EP  [facility]  Comer, Okey Regal, MD Consulting Physician Infectious Diseases  04/05/16  Comment: Hep C txmnt  Liliane Shi, PA-C Physician Assistant Cardiology  04/05/16    Comment: follows pt's fluid status---> L Ext edema  Dillingham, Loel Lofty, DO Attending Physician Plastic Surgery  04/05/16    Comment: Dr. Theodoro Kos   ( plastics and reconstuctive sx at [facility]) sees her in [place];  she is who has diagnosed him with Berger's disease     Patient Active Problem List   Diagnosis Date Noted  . HLD (hyperlipidemia) 05/15/2017  . 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 Alcoholism /alcohol abuse- episodic binges 04/05/2016  . 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 Noncompliance with diet and  medication regimen 04/05/2016  . Signficant h/o tobacco abuse (150pk/yr hx) 03/03/2016  . h/o Basal cell carcinoma of nose 03/03/2016  . GAD (generalized anxiety disorder) 03/03/2016  . h/o Hepatitis C infection 11/14/2015  . COPD mixed type ([place]) 10/12/2015  . Insomnia 04/05/2016  . Berger's disease: IgA Nephropathy  10/12/2015  . Aneurysm of infrarenal abdominal aorta ([place]) 09/24/2015  . h/o Hypothyroidism 09/22/2015  . Venous insufficiency of both lower extremities 09/21/2015  . H/O CHF 08/20/2015  . h/o SVT (supraventricular tachycardia) s/p Ablation 05/22/2015  . Obesity, Class I, BMI 30-34.9 04/29/2017  . Elevated blood pressure reading- situational 04/29/2017  . History of elevated glucose 04/29/2017  . Colonoscopy refused 04/29/2017  . Vitamin D deficiency 05/19/2016  . Liver fibrosis 03/24/2016  . Osteoarthritis of left hip 09/15/2019  . Vaccination declined by pt 08/25/2019  . Adjustment reaction with anxiety and depression 06/09/2019  . Reactive airway disease without complication [id number]  . Environmental and seasonal allergies 02/09/2019  . Abnormal skin growth- R inner nasal lacrimal fold 05/18/2018  . Excessive use of nonsteroidal anti-inflammatory drug (NSAID) 01/17/2018  . Chronic Right leg pain-  pt w/ history of ORIF femur 30-40 years ago increasing pain for 1 year, terrible pain 3 months 01/17/2018  . Insomnia due to anxiety and fear 09/15/2017  . Positive for macroalbuminuria 05/15/2017  . Low level of high density lipoprotein (HDL) 05/15/2017  . Hypertriglyceridemia 05/15/2017    Past Medical history, Surgical history, Family history, Social history, Allergies and Medications have been entered into the medical record, reviewed and changed as needed.    Current Meds  Medication Sig  . acetaminophen (TYLENOL) 500 MG tablet Take 500 mg by mouth every 6 (six) hours as needed.  Marland Kitchen aspirin EC 81 MG EC tablet Take 1 tablet (81 mg total) by mouth daily.  .  Cholecalciferol (VITAMIN D3) 5000 units CAPS Take 5,000 Units by mouth daily.   . citalopram (CELEXA) 40 MG tablet TAKE 1 TABLET BY MOUTH DAILY. (Patient taking differently: Take 40 mg by mouth daily. )  . HYDROcodone-acetaminophen (NORCO) 7.5-325 MG tablet Take 1 tablet by mouth every 8 (eight) hours as needed for moderate pain (cough). (Patient taking differently: Take 1 tablet by mouth at bedtime. )  . losartan (COZAAR) 50 MG tablet TAKE 1 TABLET (50 MG TOTAL) BY MOUTH DAILY.  . montelukast (SINGULAIR) 10 MG tablet TAKE 1 TABLET BY MOUTH AT BEDTIME. FOR BREATHING/ALLERGIES  . rosuvastatin (CRESTOR) 40 MG tablet Take 0.5 tablets (20 mg total) by mouth at bedtime.  Marland Kitchen zolpidem (AMBIEN CR) 12.5 MG CR tablet TAKE 1 TABLET BY MOUTH AT BEDTIME AS NEEDED FOR SLEEP (Patient taking differently: Take 12.5 mg by mouth at bedtime as needed (sleep.). )    Allergies:  No Known Allergies  Review of Systems:  A fourteen system review of systems was performed and found to be positive as per HPI.   Objective:   Blood pressure 115/83, pulse 88, temperature 97.8 F (36.6 C), temperature source Oral, resp. rate 16, height 6\' 2"  (1.88 m), weight 241 lb 6.4 oz (109.5 kg), SpO2 97 %. Body mass index is 30.99 kg/m. General:  Well Developed, well nourished, appropriate for stated age.  Neuro:  Alert and oriented,  extra-ocular muscles intact  HEENT:  Normocephalic, atraumatic, neck supple, no carotid bruits appreciated  Skin:  no gross rash, warm, pink. Cardiac:  RRR, S1 S2 Respiratory:  ECTA B/L and A/P, Not using accessory muscles, speaking in full sentences- unlabored. Vascular:  Ext warm, no cyanosis apprec.; cap RF less 2 sec. Psych:  No HI/SI, judgement and insight good, Euthymic mood. Full Affect.

## 2019-09-16 NOTE — Patient Instructions (Signed)
-   Return for CMP- lab only- in 6 weeks after starting statin on 08/30/19,  - ALSO - FLP in 2 months after starting statin/ crestor with ov 3 d later

## 2019-09-19 ENCOUNTER — Encounter (HOSPITAL_COMMUNITY): Admission: RE | Payer: Self-pay | Source: Home / Self Care

## 2019-09-19 ENCOUNTER — Ambulatory Visit (HOSPITAL_COMMUNITY): Admission: RE | Admit: 2019-09-19 | Payer: Medicare Other | Source: Home / Self Care | Admitting: Orthopedic Surgery

## 2019-09-19 SURGERY — ARTHROPLASTY, HIP, TOTAL, ANTERIOR APPROACH
Anesthesia: Spinal | Site: Hip | Laterality: Left

## 2019-09-21 ENCOUNTER — Other Ambulatory Visit: Payer: Self-pay | Admitting: Family Medicine

## 2019-09-21 DIAGNOSIS — F411 Generalized anxiety disorder: Secondary | ICD-10-CM

## 2019-09-21 DIAGNOSIS — F4323 Adjustment disorder with mixed anxiety and depressed mood: Secondary | ICD-10-CM

## 2019-09-29 ENCOUNTER — Ambulatory Visit: Payer: Medicare Other | Admitting: Cardiovascular Disease

## 2019-10-07 ENCOUNTER — Ambulatory Visit: Payer: Medicare Other | Admitting: Cardiovascular Disease

## 2019-10-19 ENCOUNTER — Other Ambulatory Visit: Payer: Self-pay | Admitting: Orthopedic Surgery

## 2019-10-19 ENCOUNTER — Ambulatory Visit (INDEPENDENT_AMBULATORY_CARE_PROVIDER_SITE_OTHER): Payer: Medicare Other | Admitting: Cardiovascular Disease

## 2019-10-19 ENCOUNTER — Other Ambulatory Visit: Payer: Self-pay

## 2019-10-19 ENCOUNTER — Encounter: Payer: Self-pay | Admitting: Cardiovascular Disease

## 2019-10-19 ENCOUNTER — Telehealth: Payer: Self-pay | Admitting: Cardiovascular Disease

## 2019-10-19 VITALS — BP 100/70 | HR 87 | Temp 97.3°F | Ht 74.0 in | Wt 243.0 lb

## 2019-10-19 DIAGNOSIS — Z87891 Personal history of nicotine dependence: Secondary | ICD-10-CM

## 2019-10-19 DIAGNOSIS — E782 Mixed hyperlipidemia: Secondary | ICD-10-CM | POA: Diagnosis not present

## 2019-10-19 DIAGNOSIS — I714 Abdominal aortic aneurysm, without rupture: Secondary | ICD-10-CM

## 2019-10-19 DIAGNOSIS — I7143 Infrarenal abdominal aortic aneurysm, without rupture: Secondary | ICD-10-CM

## 2019-10-19 DIAGNOSIS — Z01810 Encounter for preprocedural cardiovascular examination: Secondary | ICD-10-CM | POA: Diagnosis not present

## 2019-10-19 DIAGNOSIS — I872 Venous insufficiency (chronic) (peripheral): Secondary | ICD-10-CM

## 2019-10-19 DIAGNOSIS — I471 Supraventricular tachycardia: Secondary | ICD-10-CM

## 2019-10-19 DIAGNOSIS — Z8679 Personal history of other diseases of the circulatory system: Secondary | ICD-10-CM | POA: Diagnosis not present

## 2019-10-19 DIAGNOSIS — Z01818 Encounter for other preprocedural examination: Secondary | ICD-10-CM | POA: Insufficient documentation

## 2019-10-19 NOTE — Assessment & Plan Note (Signed)
History of PSVT status post AVNRT ablation by Dr. Lovena Le January 2017 with subsequent improvement in his echo.  Not had a recurrent episode of SVT since.

## 2019-10-19 NOTE — Telephone Encounter (Signed)
Wells Guiles from [facility] is requesting appt notes today for patients preop clearance. Please fax to [phone] with Attention to Kathrynn Humble.

## 2019-10-19 NOTE — Assessment & Plan Note (Signed)
Patient was referred back to me for preoperative clearance before elective left total knee replacement by Dr. Oneal Grout.  His risk factors include tobacco abuse and mild treated hyperlipidemia.  Is never had a heart attack or stroke.  He did have a normal 2D echo performed 09/20/2015 and a normal Myoview stress test performed 05/22/2015.  He denies chest pain or shortness of breath.  He is cleared at low risk for his upcoming orthopedic procedure.

## 2019-10-19 NOTE — Assessment & Plan Note (Signed)
History of diastolic heart failure in the past with normal LV function currently not on a diuretic.  He denies shortness of breath or swelling.

## 2019-10-19 NOTE — Assessment & Plan Note (Signed)
History of 150 pack years of tobacco use having quit 4 years ago.  He does have COPD

## 2019-10-19 NOTE — Assessment & Plan Note (Signed)
History of a small abdominal aortic aneurysm measuring 3 cm by duplex ultrasound February 2017.  We will repeat an abdominal ultrasound

## 2019-10-19 NOTE — Patient Instructions (Addendum)
Medication Instructions:  Your physician recommends that you continue on your current medications as directed. Please refer to the Current Medication list given to you today.  If you need a refill on your cardiac medications before your next appointment, please call your pharmacy.   Lab work: NONE  Testing/Procedures: Your physician has requested that you have an abdominal aorta duplex. During this test, an ultrasound is used to evaluate the aorta. Allow 30 minutes for this exam. Do not eat after midnight the day before and avoid carbonated beverages  Follow-Up: At [facility], you and your health needs are our priority.  As part of our continuing mission to provide you with exceptional heart care, we have created designated Provider Care Teams.  These Care Teams include your primary Cardiologist (physician) and Advanced Practice Providers (APPs -  Physician Assistants and Nurse Practitioners) who all work together to provide you with the care you need, when you need it. You may see Dr. [name] or one of the following Advanced Practice Providers on your designated Care Team:    Kerin Ransom, PA-C  [place], [place]  Coletta Memos, [place]  Your physician wants you to follow-up: as needed

## 2019-10-19 NOTE — Assessment & Plan Note (Signed)
History of hyperlipidemia on Crestor followed by his PCP 

## 2019-10-19 NOTE — Progress Notes (Signed)
10/19/2019 George [name]   1950-11-04  [id number]  Primary Physician [name], DO Primary Cardiologist: Lorretta Harp MD Lupe Carney, [place]  HPI:  George [name] is a 69 y.o.  thin-appearing divorced Caucasian male with no children is retired from doing [employer] work. I last saw him in the office 02/06/2016.  He is accompanied by his friend [name] today.  He is referred back for preoperative clearance before elective left total hip replacement by Dr. Darylene Price.Marland Kitchen He was recently seen at [facility] after he was admitted with PSVT and subsequent chest pain converting with intravenous adenosine. He had mildly elevated troponins which led to a Myoview stress test which was low risk. A CT scan angiogram  showed no evidence of pulmonary emboli but did show a 5 mm nodule which will need to be followed by repeat CT scanning to rule out malignancy given his tobacco abuse history. 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 Lovena Le in January with subsequent improvement in his ejection fraction by 2-D echocardiography. He also has been treated for chronic hepatitis C with Harvoni. Over the last several weeks he's had increasing shortness of breath with PND and orthopnea. He has had a temperature weight gain with 2-  3+ pitting edema. He does admit to taking his diuretics on an every other day basis not how they were prescribed.  He also admitted to  dietary indiscretion with regards to salt.    Since I saw him 4 years ago he is remained stable.  He denies chest pain or shortness of breath.  He did stop smoking 4 years ago after smoking 150 pack years and does have COPD.  He apparently needs left total hip replacement by Dr. Darylene Price electively.  He did have a normal 2D echocardiogram performed  09/21/2015 and a normal Myoview stress test performed 05/22/2015.   Current Meds  Medication Sig  . acetaminophen (TYLENOL) 500 MG tablet Take 500 mg by mouth every 6 (six) hours as needed.  Marland Kitchen aspirin EC 81 MG EC tablet Take 1 tablet (81 mg total) by mouth daily.  . Cholecalciferol (VITAMIN D3) 5000 units CAPS Take 5,000 Units by mouth daily.   . citalopram (CELEXA) 40 MG tablet TAKE 1 TABLET BY MOUTH DAILY.  Marland Kitchen HYDROcodone-acetaminophen (NORCO) 7.5-325 MG tablet Take 1 tablet by mouth every 8 (eight) hours as needed for moderate pain (cough). (Patient taking differently: Take 1 tablet by mouth at bedtime. )  . losartan (COZAAR) 50 MG tablet TAKE 1 TABLET (50 MG TOTAL) BY MOUTH DAILY.  . montelukast (SINGULAIR) 10 MG tablet TAKE 1 TABLET BY MOUTH AT BEDTIME. FOR BREATHING/ALLERGIES  . rosuvastatin (CRESTOR) 40 MG tablet Take 0.5 tablets (20 mg total) by mouth at bedtime.  . Vitamin D, Ergocalciferol, (DRISDOL) 1.25 MG (50000 UNIT) CAPS capsule Take one tablet wkly  . zolpidem (AMBIEN CR) 12.5 MG CR tablet TAKE 1 TABLET BY MOUTH AT BEDTIME AS NEEDED FOR SLEEP (Patient taking differently: Take 12.5 mg by mouth at bedtime as needed (sleep.). )  . [DISCONTINUED] albuterol (VENTOLIN HFA) 108 (90 Base) MCG/ACT inhaler Inhale 1-2 puffs into the lungs every 4 (four) hours as needed for wheezing or shortness of breath.     No Known Allergies  Social History   Socioeconomic History  .  Marital status: Divorced    Spouse name: Not on file  . Number of children: 0  . Years of education: 40  . Highest education level: Not on file  Occupational History  . Occupation: Retired  Tobacco Use  . Smoking status: Former Smoker    Packs/day: 2.00    Years: 50.00    Pack years: 100.00    Types: Cigarettes    Quit date: 01/24/2015    Years since quitting: 4.7  . Smokeless tobacco: Never Used  Substance and Sexual Activity  . Alcohol use: Yes    Alcohol/week: 3.0 - 4.0 standard drinks    Types: 3 - 4  Shots of liquor per week  . Drug use: No  . Sexual activity: Not Currently  Other Topics Concern  . Not on file  Social History Narrative   Fun: Trade out work, outdoors   Denies religious beliefs effecting health care.    Social Determinants of Health   Financial Resource Strain:   . Difficulty of Paying Living Expenses: Not on file  Food Insecurity:   . Worried About Charity fundraiser in the Last Year: Not on file  . Ran Out of Food in the Last Year: Not on file  Transportation Needs:   . Lack of Transportation (Medical): Not on file  . Lack of Transportation (Non-Medical): Not on file  Physical Activity:   . Days of Exercise per Week: Not on file  . Minutes of Exercise per Session: Not on file  Stress:   . Feeling of Stress : Not on file  Social Connections:   . Frequency of Communication with Friends and Family: Not on file  . Frequency of Social Gatherings with Friends and Family: Not on file  . Attends Religious Services: Not on file  . Active Member of Clubs or Organizations: Not on file  . Attends Archivist Meetings: Not on file  . Marital Status: Not on file  Intimate Partner Violence:   . Fear of Current or Ex-Partner: Not on file  . Emotionally Abused: Not on file  . Physically Abused: Not on file  . Sexually Abused: Not on file     Review of Systems: General: negative for chills, fever, night sweats or weight changes.  Cardiovascular: negative for chest pain, dyspnea on exertion, edema, orthopnea, palpitations, paroxysmal nocturnal dyspnea or shortness of breath Dermatological: negative for rash Respiratory: negative for cough or wheezing Urologic: negative for hematuria Abdominal: negative for nausea, vomiting, diarrhea, bright red blood per rectum, melena, or hematemesis Neurologic: negative for visual changes, syncope, or dizziness All other systems reviewed and are otherwise negative except as noted above.    Blood pressure 100/70, pulse  87, temperature (!) 97.3 F (36.3 C), height 6\' 2"  (1.88 m), weight 243 lb (110.2 kg).  General appearance: alert and no distress Neck: no adenopathy, no carotid bruit, no JVD, supple, symmetrical, trachea midline and thyroid not enlarged, symmetric, no tenderness/mass/nodules Lungs: clear to auscultation bilaterally Heart: regular rate and rhythm, S1, S2 normal, no murmur, click, rub or gallop Extremities: extremities normal, atraumatic, no cyanosis or edema Pulses: 2+ and symmetric Skin: Skin color, texture, turgor normal. No rashes or lesions Neurologic: Alert and oriented X 3, normal strength and tone. Normal symmetric reflexes. Normal coordination and gait  EKG sinus rhythm at 87 with borderline LVH voltage.  I personally reviewed this EKG.  ASSESSMENT AND PLAN:   h/o SVT (supraventricular tachycardia) s/p Ablation History of PSVT status post AVNRT ablation  by Dr. Lovena Le January 2017 with subsequent improvement in his echo.  Not had a recurrent episode of SVT since.  H/O CHF History of diastolic heart failure in the past with normal LV function currently not on a diuretic.  He denies shortness of breath or swelling.  Signficant h/o tobacco abuse (150pk/yr hx) History of 150 pack years of tobacco use having quit 4 years ago.  He does have COPD  HLD (hyperlipidemia) History of hyperlipidemia on Crestor followed by his PCP  Aneurysm of infrarenal abdominal aorta (HCC) History of a small abdominal aortic aneurysm measuring 3 cm by duplex ultrasound February 2017.  We will repeat an abdominal ultrasound  Preoperative clearance Patient was referred back to me for preoperative clearance before elective left total knee replacement by Dr. Oneal Grout.  His risk factors include tobacco abuse and mild treated hyperlipidemia.  Is never had a heart attack or stroke.  He did have a normal 2D echo performed 09/20/2015 and a normal Myoview stress test performed 05/22/2015.  He denies chest pain or  shortness of breath.  He is cleared at low risk for his upcoming orthopedic procedure.      Lorretta Harp MD FACP,FACC,FAHA, [facility] 10/19/2019 9:51 AM

## 2019-10-19 NOTE — Telephone Encounter (Signed)
Forwarded via [employer] function to requesting [party

## 2019-11-03 ENCOUNTER — Ambulatory Visit (HOSPITAL_COMMUNITY)
Admission: RE | Admit: 2019-11-03 | Payer: Medicare Other | Source: Ambulatory Visit | Attending: Cardiovascular Disease | Admitting: Cardiovascular Disease

## 2019-11-04 NOTE — Care Plan (Signed)
Ortho Bundle Case Management Note  Patient Details  Name: George [name] MRN: [id number] Date of Birth: 04/26/1951  Spoke with patient prior to admission. Will discharge to home with sister to help. Has all DME at home. HHPT referral to [facility] and OPPT set up at [facility]. Patient and MD in agreement with plan. Choice offered                    DME Arranged:    DME Agency:     HH Arranged:  PT HH Agency:  [facility]  Additional Comments: Please contact me with any questions of if this plan should need to change.  Ladell Heads,  [place] Specialist  [phone] 11/04/2019, 12:01 PM

## 2019-11-04 NOTE — H&P (Signed)
TOTAL HIP ADMISSION H&P  Patient is admitted for left total hip arthroplasty.  Subjective:  Chief Complaint: left hip pain  HPI: George [name], 69 y.o. male, has a history of pain and functional disability in the left hip(s) due to arthritis and patient has failed non-surgical conservative treatments for greater than 12 weeks to include NSAID's and/or analgesics, use of assistive devices and activity modification.  Onset of symptoms was abrupt starting 1 years ago with rapidlly worsening course since that time.The patient noted no past surgery on the left hip(s).  Patient currently rates pain in the left hip at 10 out of 10 with activity. Patient has night pain, worsening of pain with activity and weight bearing, trendelenberg gait, pain that interfers with activities of daily living and pain with passive range of motion. Patient has evidence of periarticular osteophytes and joint space narrowing by imaging studies. This condition presents safety issues increasing the risk of falls.   There is no current active infection.  Patient Active Problem List   Diagnosis Date Noted  . Preoperative clearance 10/19/2019  . Osteoarthritis of left hip 09/15/2019  . Vaccination declined by pt 08/25/2019  . Adjustment reaction with anxiety and depression 06/09/2019  . Reactive airway disease without complication [id number]  . Environmental and seasonal allergies 02/09/2019  . Abnormal skin growth- R inner nasal lacrimal fold 05/18/2018  . Excessive use of nonsteroidal anti-inflammatory drug (NSAID) 01/17/2018  . Chronic Right leg pain-  pt w/ history of ORIF femur 30-40 years ago increasing pain for 1 year, terrible pain 3 months 01/17/2018  . Insomnia due to anxiety and fear 09/15/2017  . Positive for macroalbuminuria 05/15/2017  . HLD (hyperlipidemia) 05/15/2017  . Low level of high density lipoprotein (HDL) 05/15/2017  . Hypertriglyceridemia 05/15/2017  . Obesity, Class I, BMI 30-34.9 04/29/2017  .  Elevated blood pressure reading- situational 04/29/2017  . History of elevated glucose 04/29/2017  . Colonoscopy refused 04/29/2017  . Vitamin D deficiency 05/19/2016  . 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 Alcoholism /alcohol abuse- episodic binges 04/05/2016  . 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 Insomnia 04/05/2016  . Noncompliance with diet and medication regimen 04/05/2016  . Liver fibrosis 03/24/2016  . Signficant h/o tobacco abuse (150pk/yr hx) 03/03/2016  . h/o Basal cell carcinoma of nose 03/03/2016  . GAD (generalized anxiety disorder) 03/03/2016  . h/o Hepatitis C infection 11/14/2015  . Berger's disease: IgA Nephropathy  10/12/2015  . COPD mixed type ([place]) 10/12/2015  . Aneurysm of infrarenal abdominal aorta ([place]) 09/24/2015  . h/o Hypothyroidism 09/22/2015  . Venous insufficiency of both lower extremities 09/21/2015  . H/O CHF 08/20/2015  . h/o SVT (supraventricular tachycardia) s/p Ablation 05/22/2015   Past Medical History:  Diagnosis Date  . AAA (abdominal aortic aneurysm) ([place])    a. [place] 09/2015 - 3cm distal AAA. //  b. [place] 5/17: 3.1 cm   . Basal cell carcinoma of right side of nose   . Berger's disease   . CKD (chronic kidney disease) 09/2015  . GERD (gastroesophageal reflux disease)   . Nephrotic syndrome   . Panic attacks   . SVT (supraventricular tachycardia) ([place]) 05/22/2015    Past Surgical History:  Procedure Laterality Date  . BASAL CELL CARCINOMA EXCISION Right ~ 06/2015   "side of my nose"  . ELECTROPHYSIOLOGIC STUDY N/A 09/10/2015   Procedure: SVT Ablation;  Surgeon: Evans Lance, MD;  Location: [facility];  Service: Cardiovascular;  Laterality:  N/A;  . FEMUR FRACTURE SURGERY Right 1970   "had plate & pin put in"  . FEMUR HARDWARE REMOVAL Right 1971   "removed plate; left pin in  . FRACTURE SURGERY    . KNEE ARTHROSCOPY Right ~ 1985  . TONSILLECTOMY AND ADENOIDECTOMY  ~ 1955    No current  facility-administered medications for this encounter.   Current Outpatient Medications  Medication Sig Dispense Refill Last Dose  . acetaminophen (TYLENOL) 500 MG tablet Take 500 mg by mouth every 6 (six) hours as needed.     Marland Kitchen aspirin EC 81 MG EC tablet Take 1 tablet (81 mg total) by mouth daily.     . Cholecalciferol (VITAMIN D3) 5000 units CAPS Take 5,000 Units by mouth daily.      . citalopram (CELEXA) 40 MG tablet TAKE 1 TABLET BY MOUTH DAILY. (Patient taking differently: Take 40 mg by mouth daily. ) 90 tablet 0   . HYDROcodone-acetaminophen (NORCO) 7.5-325 MG tablet Take 1 tablet by mouth every 8 (eight) hours as needed for moderate pain (cough). (Patient taking differently: Take 1 tablet by mouth at bedtime. ) 30 tablet 0   . losartan (COZAAR) 50 MG tablet TAKE 1 TABLET (50 MG TOTAL) BY MOUTH DAILY. 90 tablet 3   . montelukast (SINGULAIR) 10 MG tablet TAKE 1 TABLET BY MOUTH AT BEDTIME. FOR BREATHING/ALLERGIES (Patient taking differently: Take 10 mg by mouth at bedtime. ) 90 tablet 1   . rosuvastatin (CRESTOR) 40 MG tablet Take 0.5 tablets (20 mg total) by mouth at bedtime. 45 tablet 0   . Vitamin D, Ergocalciferol, (DRISDOL) 1.25 MG (50000 UNIT) CAPS capsule Take one tablet wkly (Patient taking differently: Take 50,000 Units by mouth every 7 (seven) days. ) 12 capsule 3   . zolpidem (AMBIEN CR) 12.5 MG CR tablet TAKE 1 TABLET BY MOUTH AT BEDTIME AS NEEDED FOR SLEEP (Patient taking differently: Take 12.5 mg by mouth at bedtime as needed (sleep.). ) 90 tablet 1    No Known Allergies  Social History   Tobacco Use  . Smoking status: Former Smoker    Packs/day: 2.00    Years: 50.00    Pack years: 100.00    Types: Cigarettes    Quit date: 01/24/2015    Years since quitting: 4.7  . Smokeless tobacco: Never Used  Substance Use Topics  . Alcohol use: Yes    Alcohol/week: 3.0 - 4.0 standard drinks    Types: 3 - 4 Shots of liquor per week    Family History  Problem Relation Age of Onset   . Heart disease Mother        Before age 71  . Heart attack Mother   . Cancer Mother        lung and colon  . Stroke Mother   . Heart failure Father   . Heart disease Father        After age 16  . Heart attack Father   . Hyperlipidemia Father   . Healthy Maternal Grandmother   . Healthy Maternal Grandfather   . Diabetes Paternal Grandmother   . Healthy Paternal Grandfather   . Hypertension Neg Hx      Review of Systems  Constitutional: Negative.   HENT: Negative.   Eyes: Negative.   Respiratory: Negative.        COPD  Cardiovascular: Negative.   Gastrointestinal: Negative.   Endocrine: Negative.   Genitourinary: Negative.   Musculoskeletal: Negative.   Skin: Negative.   Allergic/Immunologic: Negative.  Neurological:       Nash Mantis disease  Hematological: Negative.   Psychiatric/Behavioral: Negative.     Objective:  Physical Exam  Constitutional: He is oriented to person, place, and time. He appears well-developed and well-nourished.  HENT:  Head: Normocephalic and atraumatic.  Eyes: Pupils are equal, round, and reactive to light.  Cardiovascular: Intact distal pulses.  Respiratory: Effort normal.  Musculoskeletal:        General: Tenderness present.     Cervical back: Normal range of motion and neck supple.     Comments: Patient walks a profound left-sided limp.  Any attempts at internal extra rotation of the left hip cause severe pain he can internally rotate to -5 externally rotate to 20.    Neurological: He is alert and oriented to person, place, and time.  Skin: Skin is warm and dry.  Psychiatric: He has a normal mood and affect. His behavior is normal. Judgment and thought content normal.    Vital signs in last 24 hours:    Labs:   Estimated body mass index is 31.2 kg/m as calculated from the following:   Height as of 10/19/19: 6\' 2"  (1.88 m).   Weight as of 10/19/19: 110.2 kg.   Imaging Review Plain radiographs demonstrate erosive arthritis  with partial collapse of the femoral head and some early protrusio.    Assessment/Plan:  End stage arthritis, left hip(s)  The patient history, physical examination, clinical judgement of the provider and imaging studies are consistent with end stage degenerative joint disease of the left hip(s) and total hip arthroplasty is deemed medically necessary. The treatment options including medical management, injection therapy, arthroscopy and arthroplasty were discussed at length. The risks and benefits of total hip arthroplasty were presented and reviewed. The risks due to aseptic loosening, infection, stiffness, dislocation/subluxation,  thromboembolic complications and other imponderables were discussed.  The patient acknowledged the explanation, agreed to proceed with the plan and consent was signed. Patient is being admitted for inpatient treatment for surgery, pain control, PT, OT, prophylactic antibiotics, VTE prophylaxis, progressive ambulation and ADL's and discharge planning.The patient is planning to be discharged home with home health services    Patient's anticipated LOS is less than 2 midnights, meeting these requirements: - Younger than 83 - Lives within 1 hour of care - Has a competent adult at home to recover with post-op recover - NO history of  - Chronic pain requiring opiods  - Diabetes  - Coronary Artery Disease  - Heart failure  - Heart attack  - Stroke  - DVT/VTE  - Cardiac arrhythmia  - Respiratory Failure/COPD  - Renal failure  - Anemia  - Advanced Liver disease

## 2019-11-07 NOTE — Patient Instructions (Addendum)
DUE TO COVID-19 ONLY TWO VISITORS ARE ALLOWED TO COME WITH YOU AND STAY IN THE WAITING ROOM ONLY DURING PRE OP AND PROCEDURE DAY OF SURGERY. THE 2 VISITORS MAY VISIT WITH YOU AFTER SURGERY IN YOUR PRIVATE ROOM DURING VISITING HOURS ONLY!  YOU NEED TO HAVE A COVID 19 TEST ON: 11/10/19 @ 12:30 pm , THIS TEST MUST BE DONE BEFORE SURGERY, COME  [address] [place] , [id number].  ([place]) ONCE YOUR COVID TEST IS COMPLETED, PLEASE BEGIN THE QUARANTINE INSTRUCTIONS AS OUTLINED IN YOUR HANDOUT.                [name]   Your procedure is scheduled on: 11/14/19   Report to [facility] Main  Entrance   Report to admitting at: 7:00 AM     Call this number if you have problems the morning of surgery [phone]    Remember:    [place], NO [place].     Take these medicines the morning of surgery with A SIP OF WATER: CITALOPRAM.                                 You may not have any metal on your body including hair pins and              piercings  Do not wear jewelry, lotions, powders or perfumes, deodorant             Men may shave face and neck.   Do not bring valuables to the hospital. [place].  Contacts, dentures or bridgework may not be worn into surgery.  Leave suitcase in the car. After surgery it may be brought to your room.     Patients discharged the day of surgery will not be allowed to drive home. IF YOU ARE HAVING SURGERY AND GOING HOME THE SAME DAY, YOU MUST HAVE AN ADULT TO DRIVE YOU HOME AND BE WITH YOU FOR 24 HOURS. YOU MAY GO HOME BY TAXI OR UBER OR ORTHERWISE, BUT AN ADULT MUST ACCOMPANY YOU HOME AND STAY WITH YOU FOR 24 HOURS.  Name and phone number of your driver:  Special Instructions: N/A              Please read over the following fact sheets you were  given: _____________________________________________________________________             NO SOLID FOOD AFTER MIDNIGHT THE NIGHT PRIOR TO SURGERY. NOTHING BY MOUTH EXCEPT CLEAR LIQUIDS UNTIL: 6:30 AM . PLEASE FINISH ENSURE DRINK PER SURGEON ORDER  WHICH NEEDS TO BE COMPLETED AT: 6:30 AM .   CLEAR LIQUID DIET   Foods Allowed                                                                     Foods Excluded  Coffee and tea, regular and decaf  liquids that you cannot  Plain Jell-O any favor except red or purple                                           see through such as: Fruit ices (not with fruit pulp)                                     milk, soups, orange juice  Iced Popsicles                                    All solid food Carbonated beverages, regular and diet                                    Cranberry, grape and apple juices Sports drinks like Gatorade Lightly seasoned clear broth or consume(fat free) Sugar, honey syrup  Sample Menu Breakfast                                Lunch                                     Supper Cranberry juice                    Beef broth                            Chicken broth Jell-O                                     Grape juice                           Apple juice Coffee or tea                        Jell-O                                      Popsicle                                                Coffee or tea                        Coffee or tea  _____________________________________________________________________  [facility] - Preparing for Surgery Before surgery, you can play an important role.  Because skin is not sterile, your skin needs to be as free of germs as possible.  You can reduce the number of germs on your skin by washing with CHG (chlorahexidine gluconate) soap before surgery.  CHG is an antiseptic cleaner which kills  germs and bonds with the skin to continue killing germs even after  washing. Please DO NOT use if you have an allergy to CHG or antibacterial soaps.  If your skin becomes reddened/irritated stop using the CHG and inform your nurse when you arrive at Short Stay. Do not shave (including legs and underarms) for at least 48 hours prior to the first CHG shower.  You may shave your face/neck. Please follow these instructions carefully:  1.  Shower with CHG Soap the night before surgery and the  morning of Surgery.  2.  If you choose to wash your hair, wash your hair first as usual with your  normal  shampoo.  3.  After you shampoo, rinse your hair and body thoroughly to remove the  shampoo.                           4.  Use CHG as you would any other liquid soap.  You can apply chg directly  to the skin and wash                       Gently with a scrungie or clean washcloth.  5.  Apply the CHG Soap to your body ONLY FROM THE NECK DOWN.   Do not use on face/ open                           Wound or open sores. Avoid contact with eyes, ears mouth and genitals (private parts).                       Wash face,  Genitals (private parts) with your normal soap.             6.  Wash thoroughly, paying special attention to the area where your surgery  will be performed.  7.  Thoroughly rinse your body with warm water from the neck down.  8.  DO NOT shower/wash with your normal soap after using and rinsing off  the CHG Soap.                9.  Pat yourself dry with a clean towel.            10.  Wear clean pajamas.            11.  Place clean sheets on your bed the night of your first shower and do not  sleep with pets. Day of Surgery : Do not apply any lotions/deodorants the morning of surgery.  Please wear clean clothes to the hospital/surgery center.  FAILURE TO FOLLOW THESE INSTRUCTIONS MAY RESULT IN THE CANCELLATION OF YOUR SURGERY PATIENT SIGNATURE_________________________________  NURSE  SIGNATURE__________________________________  ________________________________________________________________________   Adam Phenix  An incentive spirometer is a tool that can help keep your lungs clear and active. This tool measures how well you are filling your lungs with each breath. Taking long deep breaths may help reverse or decrease the chance of developing breathing (pulmonary) problems (especially infection) following:  A long period of time when you are unable to move or be active. BEFORE THE PROCEDURE   If the spirometer includes an indicator to show your best effort, your nurse or respiratory therapist will set it to a desired goal.  If possible, sit up straight or lean slightly forward. Try not to slouch.  Hold the incentive spirometer in an  upright position. INSTRUCTIONS FOR USE  1. Sit on the edge of your bed if possible, or sit up as far as you can in bed or on a chair. 2. Hold the incentive spirometer in an upright position. 3. Breathe out normally. 4. Place the mouthpiece in your mouth and seal your lips tightly around it. 5. Breathe in slowly and as deeply as possible, raising the piston or the ball toward the top of the column. 6. Hold your breath for 3-5 seconds or for as long as possible. Allow the piston or ball to fall to the bottom of the column. 7. Remove the mouthpiece from your mouth and breathe out normally. 8. Rest for a few seconds and repeat Steps 1 through 7 at least 10 times every 1-2 hours when you are awake. Take your time and take a few normal breaths between deep breaths. 9. The spirometer may include an indicator to show your best effort. Use the indicator as a goal to work toward during each repetition. 10. After each set of 10 deep breaths, practice coughing to be sure your lungs are clear. If you have an incision (the cut made at the time of surgery), support your incision when coughing by placing a pillow or rolled up towels firmly  against it. Once you are able to get out of bed, walk around indoors and cough well. You may stop using the incentive spirometer when instructed by your caregiver.  RISKS AND COMPLICATIONS  Take your time so you do not get dizzy or light-headed.  If you are in pain, you may need to take or ask for pain medication before doing incentive spirometry. It is harder to take a deep breath if you are having pain. AFTER USE  Rest and breathe slowly and easily.  It can be helpful to keep track of a log of your progress. Your caregiver can provide you with a simple table to help with this. If you are using the spirometer at home, follow these instructions: [place] IF:   You are having difficultly using the spirometer.  You have trouble using the spirometer as often as instructed.  Your pain medication is not giving enough relief while using the spirometer.  You develop fever of 100.5 F (38.1 C) or higher. SEEK IMMEDIATE MEDICAL CARE IF:   You cough up bloody sputum that had not been present before.  You develop fever of 102 F (38.9 C) or greater.  You develop worsening pain at or near the incision site. MAKE SURE YOU:   Understand these instructions.  Will watch your condition.  Will get help right away if you are not doing well or get worse. Document Released: 12/08/2006 Document Revised: 10/20/2011 Document Reviewed: 02/08/2007 [facility] Patient Information 2014 [place], [place].   ________________________________________________________________________

## 2019-11-08 ENCOUNTER — Encounter (HOSPITAL_COMMUNITY)
Admission: RE | Admit: 2019-11-08 | Discharge: 2019-11-08 | Disposition: A | Discharge status: Home / Self Care | Payer: Medicare Other | Source: Ambulatory Visit | Attending: Orthopedic Surgery | Admitting: Orthopedic Surgery

## 2019-11-08 ENCOUNTER — Other Ambulatory Visit: Payer: Self-pay

## 2019-11-08 ENCOUNTER — Encounter (HOSPITAL_COMMUNITY): Payer: Self-pay

## 2019-11-08 DIAGNOSIS — Z01818 Encounter for other preprocedural examination: Secondary | ICD-10-CM | POA: Insufficient documentation

## 2019-11-08 HISTORY — DX: Sleep apnea, unspecified: G47.30

## 2019-11-08 HISTORY — DX: Chronic obstructive pulmonary disease, unspecified: J44.9

## 2019-11-08 NOTE — Progress Notes (Signed)
PCP - Dr. Raliegh Scarlet D. LOV: 09/16/19. Cardiologist - Dr. Adora Fridge. LOV: 10/19/19.:clearance.  Chest x-ray -  EKG - 10/19/19 Stress Test -  ECHO - 09/21/15 Cardiac Cath -   Sleep Study -  CPAP -   Fasting Blood Sugar -  Checks Blood Sugar _____ times a day  Blood Thinner Instructions: Aspirin Instructions: From Dr. Damita Dunnings office. Last Dose:  Anesthesia review: Hx. AAA,STV,CKD,panic attacks.  Patient denies shortness of breath, fever, cough and chest pain at PAT appointment   Patient verbalized understanding of instructions that were given to them at the PAT appointment. Patient was also instructed that they will need to review over the PAT instructions again at home before surgery.

## 2019-11-10 ENCOUNTER — Other Ambulatory Visit (HOSPITAL_COMMUNITY)
Admission: RE | Admit: 2019-11-10 | Discharge: 2019-11-10 | Disposition: A | Discharge status: Home / Self Care | Payer: Medicare Other | Source: Ambulatory Visit | Attending: Orthopedic Surgery | Admitting: Orthopedic Surgery

## 2019-11-10 ENCOUNTER — Encounter (HOSPITAL_COMMUNITY)
Admission: RE | Admit: 2019-11-10 | Discharge: 2019-11-10 | Disposition: A | Discharge status: Home / Self Care | Payer: Medicare Other | Source: Ambulatory Visit | Attending: Orthopedic Surgery | Admitting: Orthopedic Surgery

## 2019-11-10 ENCOUNTER — Ambulatory Visit (HOSPITAL_COMMUNITY)
Admission: RE | Admit: 2019-11-10 | Discharge: 2019-11-10 | Disposition: A | Discharge status: Home / Self Care | Payer: Medicare Other | Source: Ambulatory Visit | Attending: Orthopedic Surgery | Admitting: Orthopedic Surgery

## 2019-11-10 ENCOUNTER — Other Ambulatory Visit: Payer: Self-pay

## 2019-11-10 DIAGNOSIS — Z20822 Contact with and (suspected) exposure to covid-19: Secondary | ICD-10-CM | POA: Insufficient documentation

## 2019-11-10 DIAGNOSIS — Z01818 Encounter for other preprocedural examination: Secondary | ICD-10-CM

## 2019-11-10 DIAGNOSIS — J439 Emphysema, unspecified: Secondary | ICD-10-CM | POA: Diagnosis not present

## 2019-11-10 LAB — CBC WITH DIFFERENTIAL/PLATELET
Abs Immature Granulocytes: 0.02 10*3/uL (ref 0.00–0.07)
Basophils Absolute: 0.1 10*3/uL (ref 0.0–0.1)
Basophils Relative: 1 %
Eosinophils Absolute: 0.1 10*3/uL (ref 0.0–0.5)
Eosinophils Relative: 1 %
HCT: 43.6 % (ref 39.0–52.0)
Hemoglobin: 13.7 g/dL (ref 13.0–17.0)
Immature Granulocytes: 0 %
Lymphocytes Relative: 12 %
Lymphs Abs: 0.8 10*3/uL (ref 0.7–4.0)
MCH: 28.2 pg (ref 26.0–34.0)
MCHC: 31.4 g/dL (ref 30.0–36.0)
MCV: 89.7 fL (ref 80.0–100.0)
Monocytes Absolute: 0.4 10*3/uL (ref 0.1–1.0)
Monocytes Relative: 6 %
Neutro Abs: 5 10*3/uL (ref 1.7–7.7)
Neutrophils Relative %: 80 %
Platelets: 256 10*3/uL (ref 150–400)
RBC: 4.86 MIL/uL (ref 4.22–5.81)
RDW: 18.5 % — ABNORMAL HIGH (ref 11.5–15.5)
WBC: 6.3 10*3/uL (ref 4.0–10.5)
nRBC: 0 % (ref 0.0–0.2)

## 2019-11-10 LAB — BASIC METABOLIC PANEL
Anion gap: 10 (ref 5–15)
BUN: 17 mg/dL (ref 8–23)
CO2: 23 mmol/L (ref 22–32)
Calcium: 9.3 mg/dL (ref 8.9–10.3)
Chloride: 101 mmol/L (ref 98–111)
Creatinine, Ser: 1.08 mg/dL (ref 0.61–1.24)
GFR calc Af Amer: >60 mL/min (ref >60)
GFR calc non Af Amer: >60 mL/min (ref >60)
Glucose, Bld: 138 mg/dL — ABNORMAL HIGH (ref 70–99)
Potassium: 5 mmol/L (ref 3.5–5.1)
Sodium: 134 mmol/L — ABNORMAL LOW (ref 135–145)

## 2019-11-10 LAB — APTT: aPTT: 27 seconds (ref 24–36)

## 2019-11-10 LAB — SARS CORONAVIRUS 2 (TAT 6-24 HRS): SARS Coronavirus 2: NEGATIVE

## 2019-11-10 LAB — SURGICAL PCR SCREEN
MRSA, PCR: POSITIVE — AB
Staphylococcus aureus: POSITIVE — AB

## 2019-11-10 LAB — PROTIME-INR
INR: 1 (ref 0.8–1.2)
Prothrombin Time: 13.5 seconds (ref 11.4–15.2)

## 2019-11-10 NOTE — Progress Notes (Signed)
PCR results routed to Dr. Mayer Camel for review.

## 2019-11-10 NOTE — Progress Notes (Signed)
Pt. Came for PST lab appointment,he was unable to give urine specimen.RN encourage him to wait and try after a big glass of water was given.Pt. waited for an hour at the waiting room,he said he will not be able to void until 5:00 pm because he only goes twice,and he void before he left the house.RN explained the importance of getting the specimen before surgery,but he insisted that he couldn't get the urine sample.

## 2019-11-12 NOTE — Anesthesia Preprocedure Evaluation (Addendum)
Anesthesia Evaluation  Patient identified by MRN, date of birth, ID band Patient awake    Reviewed: Allergy & Precautions, NPO status , Patient's Chart, lab work & pertinent test results  History of Anesthesia Complications Negative for: history of anesthetic complications  Airway Mallampati: II  TM Distance: >3 FB Neck ROM: Full    Dental  (+) Edentulous Upper, Missing,    Pulmonary sleep apnea , COPD, former smoker,    Pulmonary exam normal        Cardiovascular hypertension, Pt. on medications Normal cardiovascular exam+ dysrhythmias (2016) Supra Ventricular Tachycardia   TTE 2017: EF 50-55%, mild LAE   Neuro/Psych Anxiety negative neurological ROS     GI/Hepatic GERD  Controlled,(+) Hepatitis -, C  Endo/Other  Hypothyroidism   Renal/GU Renal disease (Berger's dx)  negative genitourinary   Musculoskeletal  (+) Arthritis ,   Abdominal   Peds  Hematology negative hematology ROS (+)   Anesthesia Other Findings Day of surgery medications reviewed with patient.  Reproductive/Obstetrics negative OB ROS                            Anesthesia Physical Anesthesia Plan  ASA: II  Anesthesia Plan: Spinal   Post-op Pain Management:    Induction:   PONV Risk Score and Plan: 2 and Treatment may vary due to age or medical condition, Ondansetron, Dexamethasone and Propofol infusion  Airway Management Planned: Natural Airway and Simple Face Mask  Additional Equipment: None  Intra-op Plan:   Post-operative Plan:   Informed Consent: I have reviewed the patients History and Physical, chart, labs and discussed the procedure including the risks, benefits and alternatives for the proposed anesthesia with the patient or authorized representative who has indicated his/her understanding and acceptance.       Plan Discussed with: CRNA  Anesthesia Plan Comments:        Anesthesia Quick  Evaluation

## 2019-11-13 MED ORDER — BUPIVACAINE LIPOSOME 1.3 % IJ SUSP
10.0000 mL | Freq: Once | INTRAMUSCULAR | Status: DC
  Filled 2019-11-13: qty 10

## 2019-11-13 MED ORDER — TRANEXAMIC ACID 1000 MG/10ML IV SOLN
2000.0000 mg | INTRAVENOUS | Status: DC
  Filled 2019-11-13: qty 20

## 2019-11-13 MED ORDER — VANCOMYCIN HCL 1500 MG/300ML IV SOLN
1500.0000 mg | INTRAVENOUS | Status: AC
  Administered 2019-11-14: 08:00:00 1500 mg via INTRAVENOUS
  Filled 2019-11-13: qty 300

## 2019-11-14 ENCOUNTER — Encounter (HOSPITAL_COMMUNITY): Payer: Self-pay | Admitting: Orthopedic Surgery

## 2019-11-14 ENCOUNTER — Ambulatory Visit (HOSPITAL_COMMUNITY): Payer: Medicare Other

## 2019-11-14 ENCOUNTER — Ambulatory Visit (HOSPITAL_COMMUNITY): Payer: Medicare Other | Admitting: Certified Registered Nurse Anesthetist

## 2019-11-14 ENCOUNTER — Encounter (HOSPITAL_COMMUNITY)
Admission: RE | Disposition: A | Discharge status: Home Health | Payer: Self-pay | Source: Home / Self Care | Attending: Orthopedic Surgery

## 2019-11-14 ENCOUNTER — Inpatient Hospital Stay (HOSPITAL_COMMUNITY)
Admission: RE | Admit: 2019-11-14 | Discharge: 2019-11-17 | DRG: 470 | Disposition: A | Discharge status: Home Health | Payer: Medicare Other | Attending: Orthopedic Surgery | Admitting: Orthopedic Surgery

## 2019-11-14 DIAGNOSIS — I10 Essential (primary) hypertension: Secondary | ICD-10-CM | POA: Diagnosis present

## 2019-11-14 DIAGNOSIS — M87052 Idiopathic aseptic necrosis of left femur: Secondary | ICD-10-CM | POA: Diagnosis not present

## 2019-11-14 DIAGNOSIS — Z96642 Presence of left artificial hip joint: Secondary | ICD-10-CM | POA: Diagnosis not present

## 2019-11-14 DIAGNOSIS — Z7982 Long term (current) use of aspirin: Secondary | ICD-10-CM

## 2019-11-14 DIAGNOSIS — Z87441 Personal history of nephrotic syndrome: Secondary | ICD-10-CM

## 2019-11-14 DIAGNOSIS — D62 Acute posthemorrhagic anemia: Secondary | ICD-10-CM | POA: Diagnosis not present

## 2019-11-14 DIAGNOSIS — E669 Obesity, unspecified: Secondary | ICD-10-CM | POA: Diagnosis present

## 2019-11-14 DIAGNOSIS — Z79899 Other long term (current) drug therapy: Secondary | ICD-10-CM

## 2019-11-14 DIAGNOSIS — F411 Generalized anxiety disorder: Secondary | ICD-10-CM | POA: Diagnosis not present

## 2019-11-14 DIAGNOSIS — J449 Chronic obstructive pulmonary disease, unspecified: Secondary | ICD-10-CM | POA: Diagnosis present

## 2019-11-14 DIAGNOSIS — Z85828 Personal history of other malignant neoplasm of skin: Secondary | ICD-10-CM | POA: Diagnosis not present

## 2019-11-14 DIAGNOSIS — E781 Pure hyperglyceridemia: Secondary | ICD-10-CM | POA: Diagnosis present

## 2019-11-14 DIAGNOSIS — Z6831 Body mass index (BMI) 31.0-31.9, adult: Secondary | ICD-10-CM

## 2019-11-14 DIAGNOSIS — Z20822 Contact with and (suspected) exposure to covid-19: Secondary | ICD-10-CM | POA: Diagnosis not present

## 2019-11-14 DIAGNOSIS — E1122 Type 2 diabetes mellitus with diabetic chronic kidney disease: Secondary | ICD-10-CM | POA: Diagnosis present

## 2019-11-14 DIAGNOSIS — E039 Hypothyroidism, unspecified: Secondary | ICD-10-CM | POA: Diagnosis not present

## 2019-11-14 DIAGNOSIS — Z471 Aftercare following joint replacement surgery: Secondary | ICD-10-CM | POA: Diagnosis not present

## 2019-11-14 DIAGNOSIS — M1612 Unilateral primary osteoarthritis, left hip: Secondary | ICD-10-CM | POA: Diagnosis not present

## 2019-11-14 DIAGNOSIS — K7400 Hepatic fibrosis, unspecified: Secondary | ICD-10-CM | POA: Diagnosis present

## 2019-11-14 DIAGNOSIS — F410 Panic disorder [episodic paroxysmal anxiety] without agoraphobia: Secondary | ICD-10-CM | POA: Diagnosis present

## 2019-11-14 DIAGNOSIS — K219 Gastro-esophageal reflux disease without esophagitis: Secondary | ICD-10-CM | POA: Diagnosis present

## 2019-11-14 DIAGNOSIS — Z87891 Personal history of nicotine dependence: Secondary | ICD-10-CM

## 2019-11-14 DIAGNOSIS — I951 Orthostatic hypotension: Secondary | ICD-10-CM | POA: Diagnosis not present

## 2019-11-14 DIAGNOSIS — E785 Hyperlipidemia, unspecified: Secondary | ICD-10-CM | POA: Diagnosis present

## 2019-11-14 HISTORY — PX: TOTAL HIP ARTHROPLASTY: SHX124

## 2019-11-14 LAB — BPAM RBC
Blood Product Expiration Date: 202104192359
Blood Product Expiration Date: 202104192359
Unit Type and Rh: 6200
Unit Type and Rh: 6200

## 2019-11-14 LAB — TYPE AND SCREEN
ABO/RH(D): A POS
Antibody Screen: POSITIVE
PT AG Type: NEGATIVE
Unit division: 0
Unit division: 0

## 2019-11-14 SURGERY — ARTHROPLASTY, HIP, TOTAL, ANTERIOR APPROACH
Anesthesia: Spinal | Site: Hip | Laterality: Left

## 2019-11-14 MED ORDER — GABAPENTIN 100 MG PO CAPS
100.0000 mg | ORAL_CAPSULE | Freq: Three times a day (TID) | ORAL | Status: DC
  Administered 2019-11-14 – 2019-11-17 (×9): 100 mg via ORAL
  Filled 2019-11-14 (×8): qty 1

## 2019-11-14 MED ORDER — POLYETHYLENE GLYCOL 3350 17 G PO PACK: 17.0000 g | PACK | Freq: Every day | ORAL | Status: DC | PRN

## 2019-11-14 MED ORDER — PROPOFOL 10 MG/ML IV BOLUS
INTRAVENOUS | Status: AC
  Filled 2019-11-14: qty 20

## 2019-11-14 MED ORDER — TRANEXAMIC ACID-NACL 1000-0.7 MG/100ML-% IV SOLN
1000.0000 mg | INTRAVENOUS | Status: AC
  Administered 2019-11-14: 1000 mg via INTRAVENOUS
  Filled 2019-11-14: qty 100

## 2019-11-14 MED ORDER — ACETAMINOPHEN 500 MG PO TABS
1000.0000 mg | ORAL_TABLET | Freq: Four times a day (QID) | ORAL | Status: AC
  Administered 2019-11-14 (×3): 1000 mg via ORAL
  Filled 2019-11-14 (×5): qty 2

## 2019-11-14 MED ORDER — METOCLOPRAMIDE HCL 5 MG PO TABS: 5.0000 mg | ORAL_TABLET | Freq: Three times a day (TID) | ORAL | Status: DC | PRN

## 2019-11-14 MED ORDER — BUPIVACAINE LIPOSOME 1.3 % IJ SUSP
INTRAMUSCULAR | Status: DC | PRN
  Administered 2019-11-14: 10 mL

## 2019-11-14 MED ORDER — PHENYLEPHRINE HCL-NACL 10-0.9 MG/250ML-% IV SOLN
INTRAVENOUS | Status: DC | PRN
  Administered 2019-11-14: 30 ug/min via INTRAVENOUS

## 2019-11-14 MED ORDER — HYDROMORPHONE HCL 1 MG/ML IJ SOLN: 0.5000 mg | INTRAMUSCULAR | Status: DC | PRN

## 2019-11-14 MED ORDER — DEXAMETHASONE SODIUM PHOSPHATE 10 MG/ML IJ SOLN
INTRAMUSCULAR | Status: DC | PRN
  Administered 2019-11-14: 10 mg via INTRAVENOUS

## 2019-11-14 MED ORDER — BISACODYL 5 MG PO TBEC: 5.0000 mg | DELAYED_RELEASE_TABLET | Freq: Every day | ORAL | Status: DC | PRN

## 2019-11-14 MED ORDER — FENTANYL CITRATE (PF) 100 MCG/2ML IJ SOLN
INTRAMUSCULAR | Status: AC
  Filled 2019-11-14: qty 2

## 2019-11-14 MED ORDER — LACTATED RINGERS IV SOLN: INTRAVENOUS | Status: DC

## 2019-11-14 MED ORDER — BUPIVACAINE-EPINEPHRINE 0.25% -1:200000 IJ SOLN
INTRAMUSCULAR | Status: DC | PRN
  Administered 2019-11-14: 30 mL

## 2019-11-14 MED ORDER — ONDANSETRON HCL 4 MG/2ML IJ SOLN
INTRAMUSCULAR | Status: AC
  Filled 2019-11-14: qty 2

## 2019-11-14 MED ORDER — PHENYLEPHRINE 40 MCG/ML (10ML) SYRINGE FOR IV PUSH (FOR BLOOD PRESSURE SUPPORT)
PREFILLED_SYRINGE | INTRAVENOUS | Status: AC
  Filled 2019-11-14: qty 10

## 2019-11-14 MED ORDER — ALUM & MAG HYDROXIDE-SIMETH 200-200-20 MG/5ML PO SUSP: 15.0000 mL | ORAL | Status: DC | PRN

## 2019-11-14 MED ORDER — SODIUM CHLORIDE (PF) 0.9 % IJ SOLN
INTRAMUSCULAR | Status: AC
  Filled 2019-11-14: qty 50

## 2019-11-14 MED ORDER — CHLORHEXIDINE GLUCONATE 4 % EX LIQD: 60.0000 mL | Freq: Once | CUTANEOUS | Status: DC

## 2019-11-14 MED ORDER — DIPHENHYDRAMINE HCL 12.5 MG/5ML PO ELIX: 12.5000 mg | ORAL_SOLUTION | ORAL | Status: DC | PRN

## 2019-11-14 MED ORDER — OXYCODONE HCL 5 MG/5ML PO SOLN: 5.0000 mg | Freq: Once | ORAL | Status: DC | PRN

## 2019-11-14 MED ORDER — ACETAMINOPHEN 500 MG PO TABS
1000.0000 mg | ORAL_TABLET | Freq: Once | ORAL | Status: AC
  Administered 2019-11-14: 1000 mg via ORAL
  Filled 2019-11-14: qty 2

## 2019-11-14 MED ORDER — ZOLPIDEM TARTRATE 5 MG PO TABS
5.0000 mg | ORAL_TABLET | Freq: Every evening | ORAL | Status: DC | PRN
  Administered 2019-11-14 – 2019-11-16 (×3): 5 mg via ORAL
  Filled 2019-11-14 (×3): qty 1

## 2019-11-14 MED ORDER — MENTHOL 3 MG MT LOZG: 1.0000 | LOZENGE | OROMUCOSAL | Status: DC | PRN

## 2019-11-14 MED ORDER — FLEET ENEMA 7-19 GM/118ML RE ENEM: 1.0000 | ENEMA | Freq: Once | RECTAL | Status: DC | PRN

## 2019-11-14 MED ORDER — KCL IN DEXTROSE-NACL 20-5-0.45 MEQ/L-%-% IV SOLN
INTRAVENOUS | Status: DC
  Filled 2019-11-14 (×4): qty 1000

## 2019-11-14 MED ORDER — FENTANYL CITRATE (PF) 100 MCG/2ML IJ SOLN
INTRAMUSCULAR | Status: DC | PRN
  Administered 2019-11-14 (×2): 50 ug via INTRAVENOUS

## 2019-11-14 MED ORDER — DEXAMETHASONE SODIUM PHOSPHATE 10 MG/ML IJ SOLN
INTRAMUSCULAR | Status: AC
  Filled 2019-11-14: qty 1

## 2019-11-14 MED ORDER — PHENYLEPHRINE HCL (PRESSORS) 10 MG/ML IV SOLN
INTRAVENOUS | Status: AC
  Filled 2019-11-14: qty 1

## 2019-11-14 MED ORDER — ASPIRIN EC 81 MG PO TBEC: 81.0000 mg | DELAYED_RELEASE_TABLET | Freq: Two times a day (BID) | ORAL | 0 refills | Status: AC

## 2019-11-14 MED ORDER — DEXAMETHASONE SODIUM PHOSPHATE 10 MG/ML IJ SOLN
10.0000 mg | Freq: Once | INTRAMUSCULAR | Status: AC
  Administered 2019-11-15: 09:00:00 10 mg via INTRAVENOUS
  Filled 2019-11-14: qty 1

## 2019-11-14 MED ORDER — ONDANSETRON HCL 4 MG PO TABS: 4.0000 mg | ORAL_TABLET | Freq: Four times a day (QID) | ORAL | Status: DC | PRN

## 2019-11-14 MED ORDER — PROPOFOL 10 MG/ML IV BOLUS
INTRAVENOUS | Status: DC | PRN
  Administered 2019-11-14 (×3): 10 mg via INTRAVENOUS
  Administered 2019-11-14: 20 mg via INTRAVENOUS
  Administered 2019-11-14 (×2): 10 mg via INTRAVENOUS

## 2019-11-14 MED ORDER — HYDROMORPHONE HCL 1 MG/ML IJ SOLN: 0.2500 mg | INTRAMUSCULAR | Status: DC | PRN

## 2019-11-14 MED ORDER — ROSUVASTATIN CALCIUM 20 MG PO TABS
20.0000 mg | ORAL_TABLET | Freq: Every day | ORAL | Status: DC
  Administered 2019-11-14 – 2019-11-16 (×3): 20 mg via ORAL
  Filled 2019-11-14 (×3): qty 1

## 2019-11-14 MED ORDER — CEFAZOLIN SODIUM-DEXTROSE 2-4 GM/100ML-% IV SOLN
2.0000 g | INTRAVENOUS | Status: AC
  Administered 2019-11-14: 09:00:00 2 g via INTRAVENOUS
  Filled 2019-11-14: qty 100

## 2019-11-14 MED ORDER — METHOCARBAMOL 500 MG PO TABS
500.0000 mg | ORAL_TABLET | Freq: Four times a day (QID) | ORAL | Status: DC | PRN
  Filled 2019-11-14: qty 1

## 2019-11-14 MED ORDER — SODIUM CHLORIDE 0.9% FLUSH
INTRAVENOUS | Status: DC | PRN
  Administered 2019-11-14: 50 mL

## 2019-11-14 MED ORDER — OXYCODONE-ACETAMINOPHEN 5-325 MG PO TABS: 1.0000 | ORAL_TABLET | ORAL | 0 refills | Status: DC | PRN

## 2019-11-14 MED ORDER — SODIUM CHLORIDE 0.9 % IV BOLUS
500.0000 mL | Freq: Once | INTRAVENOUS | Status: AC
  Administered 2019-11-14: 22:00:00 500 mL via INTRAVENOUS

## 2019-11-14 MED ORDER — PROPOFOL 500 MG/50ML IV EMUL
INTRAVENOUS | Status: DC | PRN
  Administered 2019-11-14: 50 ug/kg/min via INTRAVENOUS

## 2019-11-14 MED ORDER — PROMETHAZINE HCL 25 MG/ML IJ SOLN: 6.2500 mg | INTRAMUSCULAR | Status: DC | PRN

## 2019-11-14 MED ORDER — BUPIVACAINE IN DEXTROSE 0.75-8.25 % IT SOLN
INTRATHECAL | Status: DC | PRN
  Administered 2019-11-14: 1.8 mL via INTRATHECAL

## 2019-11-14 MED ORDER — PHENOL 1.4 % MT LIQD: 1.0000 | OROMUCOSAL | Status: DC | PRN

## 2019-11-14 MED ORDER — OXYCODONE HCL 5 MG PO TABS
5.0000 mg | ORAL_TABLET | ORAL | Status: DC | PRN
  Administered 2019-11-14 (×2): 10 mg via ORAL
  Administered 2019-11-14 – 2019-11-15 (×2): 5 mg via ORAL
  Administered 2019-11-15 – 2019-11-17 (×7): 10 mg via ORAL
  Filled 2019-11-14 (×8): qty 2
  Filled 2019-11-14: qty 1
  Filled 2019-11-14 (×2): qty 2

## 2019-11-14 MED ORDER — TRANEXAMIC ACID 1000 MG/10ML IV SOLN
INTRAVENOUS | Status: DC | PRN
  Administered 2019-11-14: 2000 mg via TOPICAL

## 2019-11-14 MED ORDER — PANTOPRAZOLE SODIUM 40 MG PO TBEC
40.0000 mg | DELAYED_RELEASE_TABLET | Freq: Every day | ORAL | Status: DC
  Administered 2019-11-14 – 2019-11-17 (×4): 40 mg via ORAL
  Filled 2019-11-14 (×4): qty 1

## 2019-11-14 MED ORDER — BUPIVACAINE HCL (PF) 0.25 % IJ SOLN
INTRAMUSCULAR | Status: AC
  Filled 2019-11-14: qty 30

## 2019-11-14 MED ORDER — CITALOPRAM HYDROBROMIDE 20 MG PO TABS
40.0000 mg | ORAL_TABLET | Freq: Every day | ORAL | Status: DC
  Administered 2019-11-15 – 2019-11-17 (×3): 40 mg via ORAL
  Filled 2019-11-14 (×3): qty 2

## 2019-11-14 MED ORDER — POVIDONE-IODINE 10 % EX SWAB
2.0000 "application " | Freq: Once | CUTANEOUS | Status: AC
  Administered 2019-11-14: 2 via TOPICAL

## 2019-11-14 MED ORDER — ACETAMINOPHEN 325 MG PO TABS
325.0000 mg | ORAL_TABLET | Freq: Four times a day (QID) | ORAL | Status: DC | PRN
  Administered 2019-11-15 – 2019-11-16 (×3): 650 mg via ORAL
  Filled 2019-11-14 (×3): qty 2

## 2019-11-14 MED ORDER — PHENYLEPHRINE 40 MCG/ML (10ML) SYRINGE FOR IV PUSH (FOR BLOOD PRESSURE SUPPORT)
PREFILLED_SYRINGE | INTRAVENOUS | Status: DC | PRN
  Administered 2019-11-14 (×3): 80 ug via INTRAVENOUS

## 2019-11-14 MED ORDER — METHOCARBAMOL 500 MG IVPB - SIMPLE MED
500.0000 mg | Freq: Four times a day (QID) | INTRAVENOUS | Status: DC | PRN
  Filled 2019-11-14: qty 50

## 2019-11-14 MED ORDER — TRANEXAMIC ACID-NACL 1000-0.7 MG/100ML-% IV SOLN
1000.0000 mg | Freq: Once | INTRAVENOUS | Status: AC
  Administered 2019-11-14: 14:00:00 1000 mg via INTRAVENOUS
  Filled 2019-11-14: qty 100

## 2019-11-14 MED ORDER — PROPOFOL 500 MG/50ML IV EMUL
INTRAVENOUS | Status: AC
  Filled 2019-11-14: qty 50

## 2019-11-14 MED ORDER — LIDOCAINE 2% (20 MG/ML) 5 ML SYRINGE
INTRAMUSCULAR | Status: DC | PRN
  Administered 2019-11-14: 40 mg via INTRAVENOUS

## 2019-11-14 MED ORDER — 0.9 % SODIUM CHLORIDE (POUR BTL) OPTIME
TOPICAL | Status: DC | PRN
  Administered 2019-11-14: 09:00:00 1000 mL

## 2019-11-14 MED ORDER — CELECOXIB 200 MG PO CAPS
200.0000 mg | ORAL_CAPSULE | Freq: Two times a day (BID) | ORAL | Status: DC
  Administered 2019-11-14 – 2019-11-17 (×7): 200 mg via ORAL
  Filled 2019-11-14 (×7): qty 1

## 2019-11-14 MED ORDER — TIZANIDINE HCL 2 MG PO TABS: 2.0000 mg | ORAL_TABLET | Freq: Four times a day (QID) | ORAL | 0 refills | Status: DC | PRN

## 2019-11-14 MED ORDER — ONDANSETRON HCL 4 MG/2ML IJ SOLN
INTRAMUSCULAR | Status: DC | PRN
  Administered 2019-11-14: 4 mg via INTRAVENOUS

## 2019-11-14 MED ORDER — ONDANSETRON HCL 4 MG/2ML IJ SOLN: 4.0000 mg | Freq: Four times a day (QID) | INTRAMUSCULAR | Status: DC | PRN

## 2019-11-14 MED ORDER — METOCLOPRAMIDE HCL 5 MG/ML IJ SOLN: 5.0000 mg | Freq: Three times a day (TID) | INTRAMUSCULAR | Status: DC | PRN

## 2019-11-14 MED ORDER — DOCUSATE SODIUM 100 MG PO CAPS
100.0000 mg | ORAL_CAPSULE | Freq: Two times a day (BID) | ORAL | Status: DC
  Administered 2019-11-14 – 2019-11-17 (×6): 100 mg via ORAL
  Filled 2019-11-14 (×6): qty 1

## 2019-11-14 MED ORDER — ASPIRIN 81 MG PO CHEW
81.0000 mg | CHEWABLE_TABLET | Freq: Two times a day (BID) | ORAL | Status: DC
  Administered 2019-11-14 – 2019-11-17 (×6): 81 mg via ORAL
  Filled 2019-11-14 (×6): qty 1

## 2019-11-14 MED ORDER — LOSARTAN POTASSIUM 50 MG PO TABS
50.0000 mg | ORAL_TABLET | Freq: Every day | ORAL | Status: DC
  Filled 2019-11-14: qty 1

## 2019-11-14 MED ORDER — MONTELUKAST SODIUM 10 MG PO TABS
10.0000 mg | ORAL_TABLET | Freq: Every day | ORAL | Status: DC
  Administered 2019-11-14 – 2019-11-16 (×3): 10 mg via ORAL
  Filled 2019-11-14 (×3): qty 1

## 2019-11-14 MED ORDER — OXYCODONE HCL 5 MG PO TABS: 5.0000 mg | ORAL_TABLET | Freq: Once | ORAL | Status: DC | PRN

## 2019-11-14 SURGICAL SUPPLY — 51 items
ACETAB CUP W GRIPTION 54MM (Plate) ×1 IMPLANT
ACETAB CUP W/GRIPTION 54 (Plate) ×2 IMPLANT
BAG DECANTER FOR FLEXI CONT (MISCELLANEOUS) ×3 IMPLANT
BLADE SAW SGTL 18X1.27X75 (BLADE) ×2 IMPLANT
BLADE SAW SGTL 18X1.27X75MM (BLADE) ×1
BLADE SURG SZ10 CARB STEEL (BLADE) ×6 IMPLANT
CNTNR URN SCR LID CUP LEK RST (MISCELLANEOUS) ×1 IMPLANT
CONT SPEC 4OZ STRL OR WHT (MISCELLANEOUS) ×3
COVER PERINEAL POST (MISCELLANEOUS) ×3 IMPLANT
COVER SURGICAL LIGHT HANDLE (MISCELLANEOUS) ×3 IMPLANT
COVER WAND RF STERILE (DRAPES) ×3 IMPLANT
CUP ACETAB W/GRIPTION 54 (Plate) IMPLANT
DECANTER SPIKE VIAL GLASS SM (MISCELLANEOUS) ×6 IMPLANT
DRAPE STERI IOBAN 125X83 (DRAPES) ×3 IMPLANT
DRAPE U-SHAPE 47X51 STRL (DRAPES) ×6 IMPLANT
DRSG AQUACEL AG ADV 3.5X10 (GAUZE/BANDAGES/DRESSINGS) ×3 IMPLANT
DURAPREP 26ML APPLICATOR (WOUND CARE) ×3 IMPLANT
ELECT BLADE TIP CTD 4 INCH (ELECTRODE) ×3 IMPLANT
ELECT REM PT RETURN 15FT ADLT (MISCELLANEOUS) ×3 IMPLANT
GLOVE BIO SURGEON STRL SZ7.5 (GLOVE) ×3 IMPLANT
GLOVE BIO SURGEON STRL SZ8.5 (GLOVE) ×3 IMPLANT
GLOVE BIOGEL PI IND STRL 8 (GLOVE) ×1 IMPLANT
GLOVE BIOGEL PI IND STRL 9 (GLOVE) ×1 IMPLANT
GLOVE BIOGEL PI INDICATOR 8 (GLOVE) ×2
GLOVE BIOGEL PI INDICATOR 9 (GLOVE) ×2
GOWN STRL REUS W/TWL XL LVL3 (GOWN DISPOSABLE) ×6 IMPLANT
HEAD CERAMIC 36 PLUS5 (Hips) ×2 IMPLANT
HOLDER FOLEY CATH W/STRAP (MISCELLANEOUS) ×3 IMPLANT
KIT TURNOVER KIT A (KITS) IMPLANT
LINER NEUTRAL 54X36MM PLUS 4 (Hips) ×2 IMPLANT
MANIFOLD NEPTUNE II (INSTRUMENTS) ×3 IMPLANT
NDL HYPO 21X1.5 SAFETY (NEEDLE) ×2 IMPLANT
NEEDLE HYPO 21X1.5 SAFETY (NEEDLE) ×6 IMPLANT
NS IRRIG 1000ML POUR BTL (IV SOLUTION) ×3 IMPLANT
PACK ANTERIOR HIP CUSTOM (KITS) ×3 IMPLANT
PENCIL SMOKE EVACUATOR (MISCELLANEOUS) IMPLANT
SLEEVE SUCTION 125 (MISCELLANEOUS) ×2 IMPLANT
SLEEVE SUCTION CATH 165 (SLEEVE) IMPLANT
STEM FEM ACTIS HIGH SZ7 (Stem) ×2 IMPLANT
SUT ETHIBOND NAB CT1 #1 30IN (SUTURE) ×3 IMPLANT
SUT VIC AB 0 CT1 27 (SUTURE) ×3
SUT VIC AB 0 CT1 27XBRD ANBCTR (SUTURE) ×1 IMPLANT
SUT VIC AB 1 CTX 36 (SUTURE) ×3
SUT VIC AB 1 CTX36XBRD ANBCTR (SUTURE) ×1 IMPLANT
SUT VIC AB 2-0 CT1 27 (SUTURE)
SUT VIC AB 2-0 CT1 TAPERPNT 27 (SUTURE) IMPLANT
SUT VIC AB 3-0 CT1 27 (SUTURE) ×3
SUT VIC AB 3-0 CT1 TAPERPNT 27 (SUTURE) ×1 IMPLANT
SYR CONTROL 10ML LL (SYRINGE) ×9 IMPLANT
TRAY FOLEY MTR SLVR 16FR STAT (SET/KITS/TRAYS/PACK) ×2 IMPLANT
YANKAUER SUCT BULB TIP 10FT TU (MISCELLANEOUS) ×3 IMPLANT

## 2019-11-14 NOTE — Discharge Instructions (Signed)

## 2019-11-14 NOTE — Anesthesia Postprocedure Evaluation (Signed)
Anesthesia Post Note  Patient: JAHSIAH SIEGLER  Procedure(s) Performed: LEFT TOTAL HIP ARTHROPLASTY ANTERIOR APPROACH (Left Hip)     Patient location during evaluation: PACU Anesthesia Type: Spinal Level of consciousness: awake and alert and oriented Pain management: pain level controlled Vital Signs Assessment: post-procedure vital signs reviewed and stable Respiratory status: spontaneous breathing, nonlabored ventilation and respiratory function stable Cardiovascular status: blood pressure returned to baseline Postop Assessment: no apparent nausea or vomiting, spinal receding, no headache and no backache Anesthetic complications: no    Last Vitals:  Vitals:   11/14/19 1145 11/14/19 1200  BP: 99/66 102/67  Pulse: 67 61  Resp: 12 13  Temp:  (!) 36.3 C  SpO2: 99% 100%    Last Pain:  Vitals:   11/14/19 1200  TempSrc:   PainSc: 0-No pain                 Brennan Bailey

## 2019-11-14 NOTE — Evaluation (Signed)
Physical Therapy Evaluation Patient Details Name: George [name] MRN: [id number] DOB: [birth year] Today's Date: 11/14/2019   History of Present Illness  s/p DA THA.PMH: including but not limited to-->femur fx, COPD, systolic CHF, HTN, nephrotic syndrome d/t Berger's dz, PSVTs, AAA  Clinical Impression  Pt is s/p THA resulting in the deficits listed below (see PT Problem List).  Pt amb ~ 15 with RW and +2 assist for safety. 2 significant episodes of L knee buckling (one prior to gait and one during gait). brought chair to pt after pt's second episode of knee buckling.   Pt will benefit from skilled PT to increase their independence and safety with mobility to allow discharge to the venue listed below.      Follow Up Recommendations Follow surgeon's recommendation for DC plan and follow-up therapies    Equipment Recommendations  None recommended by PT    Recommendations for Other Services       Precautions / Restrictions Precautions Precautions: Fall Restrictions Weight Bearing Restrictions: No Other Position/Activity Restrictions: WBAT      Mobility  Bed Mobility Overal bed mobility: Needs Assistance Bed Mobility: Supine to Sit     Supine to sit: Min assist     General bed mobility comments: assist with LLE  Transfers Overall transfer level: Needs assistance Equipment used: Rolling walker (2 wheeled) Transfers: Sit to/from Stand Sit to Stand: Min assist;Mod assist;+2 safety/equipment;From elevated surface         General transfer comment: cues for hand placement. assist to rise and transition to RW. one episode of L  knee buckling during standing lateral wt shifting  Ambulation/Gait Ambulation/Gait assistance: Min assist;Mod assist;+2 safety/equipment Gait Distance (Feet): 15 Feet Assistive device: Rolling walker (2 wheeled) Gait Pattern/deviations: Step-to pattern;Decreased step length - right;Decreased step length - left     General Gait Details: pt with  second episode of L knee buckling during gait requring mod assist to recover. cues for sequence and RW position, safety  Stairs            Wheelchair Mobility    Modified Rankin (Stroke Patients Only)       Balance Overall balance assessment: Needs assistance Sitting-balance support: Feet supported Sitting balance-Leahy Scale: Good       Standing balance-Leahy Scale: Poor Standing balance comment: reliant on UEs and external support                             Pertinent Vitals/Pain Pain Assessment: 0-10 Pain Score: 1  Pain Location: left hip Pain Descriptors / Indicators: Discomfort Pain Intervention(s): Limited activity within patient's tolerance;Monitored during session;Repositioned;Premedicated before session;Ice applied    Home Living Family/patient expects to be discharged to:: Private residence Living Arrangements: Alone Available Help at Discharge: Family;Available PRN/intermittently Type of Home: House Home Access: Stairs to enter Entrance Stairs-Rails: Right;Left;Can reach both Entrance Stairs-Number of Steps: 4 Home Layout: One level Home Equipment: Walker - 2 wheels;Cane - single point      Prior Function Level of Independence: Independent         Comments: amb with cane at times     Hand Dominance        Extremity/Trunk Assessment   Upper Extremity Assessment Upper Extremity Assessment: Overall WFL for tasks assessed    Lower Extremity Assessment Lower Extremity Assessment: LLE deficits/detail LLE Deficits / Details: AAROM grossly WFL; knee and hip 3/5. ankle WFL       Communication   Communication: No  difficulties  Cognition Arousal/Alertness: Awake/alert Behavior During Therapy: WFL for tasks assessed/performed Overall Cognitive Status: Within Functional Limits for tasks assessed                                        General Comments      Exercises     Assessment/Plan    PT Assessment  Patient needs continued PT services  PT Problem List Decreased strength;Decreased activity tolerance;Decreased knowledge of use of DME;Decreased mobility;Decreased balance       PT Treatment Interventions DME instruction;Gait training;Functional mobility training;Therapeutic activities;Patient/family education;Therapeutic exercise;Stair training    PT Goals (Current goals can be found in the Care Plan section)  Acute Rehab PT Goals Patient Stated Goal: home PT Goal Formulation: With patient Time For Goal Achievement: 11/21/19 Potential to Achieve Goals: Good    Frequency 7X/week   Barriers to discharge        Co-evaluation               AM-PAC PT "6 Clicks" Mobility  Outcome Measure Help needed turning from your back to your side while in a flat bed without using bedrails?: A Little Help needed moving from lying on your back to sitting on the side of a flat bed without using bedrails?: A Little Help needed moving to and from a bed to a chair (including a wheelchair)?: A Little Help needed standing up from a chair using your arms (e.g., wheelchair or bedside chair)?: A Little Help needed to walk in hospital room?: A Lot Help needed climbing 3-5 steps with a railing? : Total 6 Click Score: 15    End of Session Equipment Utilized During Treatment: Gait belt Activity Tolerance: Patient tolerated treatment well Patient left: in chair;with call bell/phone within reach;with chair alarm set Nurse Communication: Mobility status(advised knee buckling) PT Visit Diagnosis: Difficulty in walking, not elsewhere classified (R26.2);Other abnormalities of gait and mobility (R26.89)    Time: 1510-1530 PT Time Calculation (min) (ACUTE ONLY): 20 min   Charges:   PT Evaluation $PT Eval Low Complexity: 1 Low          Delrico Minehart, PT   Acute Rehab Dept [facility]): [id number]   11/14/2019   [facility] 11/14/2019, 4:31 PM

## 2019-11-14 NOTE — Anesthesia Procedure Notes (Signed)
Spinal  Patient location during procedure: OR Start time: 11/14/2019 8:40 AM End time: 11/14/2019 8:44 AM Staffing Anesthesiologist: Brennan Bailey, MD Resident/CRNA: Montel Clock, CRNA Preanesthetic Checklist Completed: patient identified, IV checked, risks and benefits discussed, surgical consent, monitors and equipment checked, pre-op evaluation and timeout performed Spinal Block Patient position: sitting Prep: DuraPrep Patient monitoring: heart rate, cardiac monitor, continuous pulse ox and blood pressure Approach: midline Location: L3-4 Injection technique: single-shot Needle Needle type: Pencan  Needle gauge: 24 G Needle length: 10 cm Needle insertion depth: 7 cm Assessment Sensory level: T6

## 2019-11-14 NOTE — Transfer of Care (Signed)
Immediate Anesthesia Transfer of Care Note  Patient: George [name]  Procedure(s) Performed: LEFT TOTAL HIP ARTHROPLASTY ANTERIOR APPROACH (Left Hip)  Patient Location: PACU  Anesthesia Type:Spinal  Level of Consciousness: drowsy and patient cooperative  Airway & Oxygen Therapy: Patient Spontanous Breathing and Patient connected to face mask oxygen  Post-op Assessment: Report given to RN and Post -op Vital signs reviewed and stable  Post vital signs: Reviewed and stable  Last Vitals:  Vitals Value Taken Time  BP 66/52 11/14/19 1058  Temp    Pulse 93 11/14/19 1059  Resp 11 11/14/19 1100  SpO2 81 % 11/14/19 1059  Vitals shown include unvalidated device data.  Last Pain:  Vitals:   11/14/19 0715  TempSrc:   PainSc: 5          Complications: No apparent anesthesia complications

## 2019-11-14 NOTE — Interval H&P Note (Signed)
History and Physical Interval Note:  11/14/2019 7:01 AM  George [name]  has presented today for surgery, with the diagnosis of LEFT HIP [place].  The various methods of treatment have been discussed with the patient and family. After consideration of risks, benefits and other options for treatment, the patient has consented to  Procedure(s): LEFT TOTAL HIP ARTHROPLASTY ANTERIOR APPROACH (Left) as a surgical intervention.  The patient's history has been reviewed, patient examined, no change in status, stable for surgery.  I have reviewed the patient's chart and labs.  Questions were answered to the patient's satisfaction.     Kerin Salen

## 2019-11-14 NOTE — Op Note (Signed)
PATIENT ID:      George [name]  MRN:     [id number] DOB/AGE:    [birth year] / 69 y.o.  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 [employer]  (present throughout entire procedure and necessary for timely completion of the procedure)   ANESTHESIA: Spinal, Exparel 133mg  injection BLOOD LOSS: 500 cc FLUID REPLACEMENT: 1800 cc crystalloid TRANEXAMIC ACID: 1gm IV, 2gm Topical COMPLICATIONS: none    INDICATIONS FOR PROCEDURE: A 69 y.o. year-old With  LEFT HIP [place]   for 3 years, x-rays show bone-on-bone arthritic changes, and osteophytes. Despite conservative measures with observation, anti-inflammatory medicine, narcotics, use of a cane, has severe unremitting pain and can ambulate only a few blocks before resting. Patient desires elective L total hip arthroplasty to decrease pain and increase function. The risks, benefits, and alternatives were discussed at length including but not limited to the risks of infection, bleeding, nerve injury, stiffness, blood clots, the need for revision surgery, cardiopulmonary complications, among others, and they were willing to proceed. Questions answered      PROCEDURE IN DETAIL: The patient was identified by armband,   received preoperative IV antibiotics in the holding area at [facility], taken to the operating room , appropriate anesthetic monitors   were attached and anesthesia was induced with the patient on the gurney. HANA boots were  applied to the feet, and the patient  was transferred to the HANA table with a peroneal post and support underneath the non-operative leg. Theoperative lower extremity was then prepped and draped in the usual sterile fashion from just above the iliac crest to the knee. And a timeout procedure was performed. Kerry Hough. Hardin Negus [facility] was present and scrubbed throughout the case, critical for assistance with, positioning, exposure, retraction, instrumentation, and closure.Skin along incision area was injected with 10 cc of Exparel solution. We then made a 12 cm incision along the interval at the leading edge of the tensor fascia lata of starting at 2 cm lateral to the ASIS. Small bleeders in the skin and subcutaneous tissue identified and cauterized we dissected down to the fascia and made an incision in the fascia allowing [place] to elevate the fascia of the tensor muscle and exploited the interval between the rectus and the tensor fascia lata. A Cobra retractor was then placed along the superior neck of the femur. A cerebellar retractor was used to expose the interval between the  tensor fascia lata and the rectus femoris.  We identified and cauterized the ascending branch of the anterior circumflex artery. A second Cobra retractor along the inferior neck of the femur. A small Hohmann retractor was placed underneath the origin of the rectus femoris, giving [place] good medial exposure. Using Ronguers fatty tissue was removed from in front of the anterior capsule. The capsule was then incised, starting out at the superior anterior rim of the acetabulum going laterally along the anterior neck. The capsule was then teed along the neck superiorly and inferiorly. Electrocautery was used to release capsule from the anterior and medial neck of the femur to allow external rotation. Cobra retractors were then placed along the inferior and superior neck allowing [place] to perform a standard neck cut and removed the femoral head with a power  corkscrew. We then placed a medium bent homan retractor in the cotyloid notch and posteriorly along the acetabular rim a narrow Cobra retractor. Exposed labral tissue and osteophytes were then removed. We then sequentially reamed up to a 53 mm basket reamer obtaining good coverage in all quadrants, verified by C-arm imaging. Under C-arm control we then hammered into place a 54 mm Gryption sector cup in 45 of abduction and 15 of anteversion. The cup seated nicely and required no supplemental screws. We then placed a central hole Eliminator and a 0 polyethylene liner. The foot was then externally rotated to 130-140. The limb was extended and adducted to the floor, delivering the proximal femur up into the wound. A medium curved Hohmann retractor was placed over the greater trochanter and a long Homan retractor along the posterior femoral neck completing the exposure and lateralizing the femur. We then performed releases superiorly and and inferiorly of the capsule going back to the pirformis fossa superiorly and to the lesser trochanter inferiorly. We then entered the proximal femur with the box cutting offset chisel followed by, a canal sounder, the chili pepper and broaching up to a 7 broach. This seated nicely and we reamed the calcar. A trial reduction was performed with a 5 mm X 36 mm head.The limb lengths were excellent the hip was stable in 90 of external rotation. At this point the trial components removed and we hammered into place a # 7 hi  Offset Actis stem with Gryption coating. A + 5 mm x 36 ceramic head was then hammered into place. The hip was reduced and final C-arm images obtained. The wound was thoroughly irrigated with normal saline solution. We repaired the ant capsule and the tensor fascia lot a with running 0 vicryl suture. the subcutaneous tissue was closed with 2-0 and 3-0 Vicryl suture followed by an Aquacil dressing. At this point the patient was awaken and transferred to hospital gurney  without difficulty.   Kerin Salen 11/14/2019, 7:01 AM

## 2019-11-15 ENCOUNTER — Encounter (HOSPITAL_COMMUNITY): Payer: Self-pay | Admitting: Orthopedic Surgery

## 2019-11-15 DIAGNOSIS — D62 Acute posthemorrhagic anemia: Secondary | ICD-10-CM | POA: Diagnosis not present

## 2019-11-15 DIAGNOSIS — Z87891 Personal history of nicotine dependence: Secondary | ICD-10-CM | POA: Diagnosis not present

## 2019-11-15 DIAGNOSIS — E781 Pure hyperglyceridemia: Secondary | ICD-10-CM | POA: Diagnosis present

## 2019-11-15 DIAGNOSIS — I10 Essential (primary) hypertension: Secondary | ICD-10-CM | POA: Diagnosis present

## 2019-11-15 DIAGNOSIS — K7400 Hepatic fibrosis, unspecified: Secondary | ICD-10-CM | POA: Diagnosis present

## 2019-11-15 DIAGNOSIS — Z85828 Personal history of other malignant neoplasm of skin: Secondary | ICD-10-CM | POA: Diagnosis not present

## 2019-11-15 DIAGNOSIS — F411 Generalized anxiety disorder: Secondary | ICD-10-CM | POA: Diagnosis present

## 2019-11-15 DIAGNOSIS — J449 Chronic obstructive pulmonary disease, unspecified: Secondary | ICD-10-CM | POA: Diagnosis present

## 2019-11-15 DIAGNOSIS — E039 Hypothyroidism, unspecified: Secondary | ICD-10-CM | POA: Diagnosis present

## 2019-11-15 DIAGNOSIS — Z96642 Presence of left artificial hip joint: Secondary | ICD-10-CM

## 2019-11-15 DIAGNOSIS — E1122 Type 2 diabetes mellitus with diabetic chronic kidney disease: Secondary | ICD-10-CM | POA: Diagnosis present

## 2019-11-15 DIAGNOSIS — Z6831 Body mass index (BMI) 31.0-31.9, adult: Secondary | ICD-10-CM | POA: Diagnosis not present

## 2019-11-15 DIAGNOSIS — Z20822 Contact with and (suspected) exposure to covid-19: Secondary | ICD-10-CM | POA: Diagnosis present

## 2019-11-15 DIAGNOSIS — M1612 Unilateral primary osteoarthritis, left hip: Secondary | ICD-10-CM | POA: Diagnosis present

## 2019-11-15 DIAGNOSIS — Z7982 Long term (current) use of aspirin: Secondary | ICD-10-CM | POA: Diagnosis not present

## 2019-11-15 DIAGNOSIS — E669 Obesity, unspecified: Secondary | ICD-10-CM | POA: Diagnosis present

## 2019-11-15 DIAGNOSIS — K219 Gastro-esophageal reflux disease without esophagitis: Secondary | ICD-10-CM | POA: Diagnosis present

## 2019-11-15 DIAGNOSIS — I951 Orthostatic hypotension: Secondary | ICD-10-CM | POA: Diagnosis not present

## 2019-11-15 DIAGNOSIS — E785 Hyperlipidemia, unspecified: Secondary | ICD-10-CM | POA: Diagnosis present

## 2019-11-15 DIAGNOSIS — Z79899 Other long term (current) drug therapy: Secondary | ICD-10-CM | POA: Diagnosis not present

## 2019-11-15 DIAGNOSIS — Z87441 Personal history of nephrotic syndrome: Secondary | ICD-10-CM | POA: Diagnosis not present

## 2019-11-15 DIAGNOSIS — F410 Panic disorder [episodic paroxysmal anxiety] without agoraphobia: Secondary | ICD-10-CM | POA: Diagnosis present

## 2019-11-15 LAB — BASIC METABOLIC PANEL
Anion gap: 6 (ref 5–15)
BUN: 19 mg/dL (ref 8–23)
CO2: 22 mmol/L (ref 22–32)
Calcium: 8.3 mg/dL — ABNORMAL LOW (ref 8.9–10.3)
Chloride: 104 mmol/L (ref 98–111)
Creatinine, Ser: 1.5 mg/dL — ABNORMAL HIGH (ref 0.61–1.24)
GFR calc Af Amer: 54 mL/min — ABNORMAL LOW (ref >60)
GFR calc non Af Amer: 46 mL/min — ABNORMAL LOW (ref >60)
Glucose, Bld: 165 mg/dL — ABNORMAL HIGH (ref 70–99)
Potassium: 4.8 mmol/L (ref 3.5–5.1)
Sodium: 132 mmol/L — ABNORMAL LOW (ref 135–145)

## 2019-11-15 LAB — CBC
HCT: 26.4 % — ABNORMAL LOW (ref 39.0–52.0)
Hemoglobin: 8.1 g/dL — ABNORMAL LOW (ref 13.0–17.0)
MCH: 28.3 pg (ref 26.0–34.0)
MCHC: 30.7 g/dL (ref 30.0–36.0)
MCV: 92.3 fL (ref 80.0–100.0)
Platelets: 176 10*3/uL (ref 150–400)
RBC: 2.86 MIL/uL — ABNORMAL LOW (ref 4.22–5.81)
RDW: 18.3 % — ABNORMAL HIGH (ref 11.5–15.5)
WBC: 11.7 10*3/uL — ABNORMAL HIGH (ref 4.0–10.5)
nRBC: 0 % (ref 0.0–0.2)

## 2019-11-15 LAB — HEMOGLOBIN AND HEMATOCRIT, BLOOD
HCT: 26 % — ABNORMAL LOW (ref 39.0–52.0)
Hemoglobin: 8.2 g/dL — ABNORMAL LOW (ref 13.0–17.0)

## 2019-11-15 MED ORDER — SODIUM CHLORIDE 0.9 % IV BOLUS
500.0000 mL | Freq: Once | INTRAVENOUS | Status: AC
  Administered 2019-11-15: 12:00:00 500 mL via INTRAVENOUS

## 2019-11-15 MED ORDER — SODIUM CHLORIDE 0.9 % IV BOLUS
500.0000 mL | Freq: Once | INTRAVENOUS | Status: AC
  Administered 2019-11-15: 500 mL via INTRAVENOUS

## 2019-11-15 NOTE — Progress Notes (Signed)
Patient having orthostatic bps with therapy all vitals signs charted. Marylou Flesher PA notified and orders received. Bethann Punches RN

## 2019-11-15 NOTE — Progress Notes (Signed)
   11/14/19 2123  Vitals  Temp 98.3 F (36.8 C)  Temp Source Oral  BP (!) 71/55  MAP (mmHg) (!) 62  BP Location Right Arm  BP Method Automatic  Patient Position (if appropriate) Lying  Pulse Rate 78  Pulse Rate Source Monitor  Resp 18  Oxygen Therapy  SpO2 97 %  O2 Device Room Air  MEWS Score  MEWS Temp 0  MEWS Systolic 2  MEWS Pulse 0  MEWS RR 0  MEWS LOC 0  MEWS Score 2  MEWS Score Color Yellow  MEWS Assessment  Is this an acute change? Yes  MEWS guidelines implemented *See Row Information* Yellow  Provider Notification  Provider Name/Title Radene Journey  Date Provider Notified 11/14/19  Time Provider Notified 2127  Notification Type Page  Notification Reason Change in status  Response See new orders  Date of Provider Response 11/14/19  Time of Provider Response 2131  VS checked. MEWS now changed to yellow. Provider on call paged with order to give x1 545ml boluse of NS -carried out. Yellow MEWS guidelines initiated. Pt is alert, no c/o any distress or chest pain. Will continue to monitor pt.

## 2019-11-15 NOTE — Progress Notes (Signed)
Physical Therapy Treatment Patient Details Name: THIBAULT ULLOM MRN: [id number] DOB: 07-03-1951 Today's Date: 11/15/2019    History of Present Illness s/p DA THA.PMH: including but not limited to-->femur fx, COPD, systolic CHF, HTN, nephrotic syndrome d/t Berger's dz, PSVTs, AAA    PT Comments    POD # 1 am session Assisted OOB to amb only 12 feet due to positive orthostatic.   Pt was positive dizzy, "fuzzy" vision with amb 10 feet  this morning.  Also noted to become pale and shaky.  Reported to RN.    EOB              BP 133/83   HR 102 Standing        BP 81/63  HR 120 Amb 10 feet   BP 65/53 HR 129  Dizzy,fuzzy,shaky assisted to recliner Back in recliner B LE elevated with head semi down BP 103/86    HR 118   Follow Up Recommendations  Follow surgeon's recommendation for DC plan and follow-up therapies     Equipment Recommendations  None recommended by PT    Recommendations for Other Services       Precautions / Restrictions Precautions Precautions: Fall Precaution Comments: monitor BP Restrictions Weight Bearing Restrictions: No Other Position/Activity Restrictions: WBAT    Mobility  Bed Mobility Overal bed mobility: Needs Assistance Bed Mobility: Supine to Sit     Supine to sit: Min guard     General bed mobility comments: demonstarted and instructed how to use belt to self assist LE OOB  Transfers Overall transfer level: Needs assistance Equipment used: Rolling walker (2 wheeled) Transfers: Sit to/from Stand Sit to Stand: Min assist         General transfer comment: cues for hand placement to avoid pulling up on walker.  Ambulation/Gait Ambulation/Gait assistance: Min assist Gait Distance (Feet): 12 Feet Assistive device: Rolling walker (2 wheeled) Gait Pattern/deviations: Step-to pattern;Decreased step length - right;Decreased step length - left Gait velocity: decreased   General Gait Details: amb distance limted due to positive  orthostatic   Stairs             Wheelchair Mobility    Modified Rankin (Stroke Patients Only)       Balance                                            Cognition Arousal/Alertness: Awake/alert Behavior During Therapy: WFL for tasks assessed/performed Overall Cognitive Status: Within Functional Limits for tasks assessed                                        Exercises      General Comments        Pertinent Vitals/Pain Pain Assessment: 0-10 Pain Score: 3  Pain Location: left hip Pain Descriptors / Indicators: Discomfort;Aching;Tender;Operative site guarding Pain Intervention(s): Monitored during session;Premedicated before session;Repositioned;Ice applied    Home Living                      Prior Function            PT Goals (current goals can now be found in the care plan section) Progress towards PT goals: Progressing toward goals    Frequency    7X/week  PT Plan Current plan remains appropriate    Co-evaluation              AM-PAC PT "6 Clicks" Mobility   Outcome Measure  Help needed turning from your back to your side while in a flat bed without using bedrails?: A Little Help needed moving from lying on your back to sitting on the side of a flat bed without using bedrails?: A Little Help needed moving to and from a bed to a chair (including a wheelchair)?: A Little Help needed standing up from a chair using your arms (e.g., wheelchair or bedside chair)?: A Little Help needed to walk in hospital room?: A Lot Help needed climbing 3-5 steps with a railing? : A Lot 6 Click Score: 16    End of Session Equipment Utilized During Treatment: Gait belt Activity Tolerance: Other (comment)(orthostatic) Patient left: in chair;with call bell/phone within reach;with chair alarm set Nurse Communication: Mobility status PT Visit Diagnosis: Difficulty in walking, not elsewhere classified (R26.2);Other  abnormalities of gait and mobility (R26.89)     Time: 1035-1100 PT Time Calculation (min) (ACUTE ONLY): 25 min  Charges:  $Gait Training: 8-22 mins $Therapeutic Activity: 8-22 mins                     Rica Koyanagi  PTA Acute  Rehabilitation Services Pager      [phone] Office      [phone]

## 2019-11-15 NOTE — Progress Notes (Signed)
PHYSICAL THERAPY afternoon session  Slight  improvement with Bolus Supine       BP 113/73  HR 77 Seated       BP 104/70  HR 101 Standing    BP 88/70    HR  108 3 min         BP 76/62    HR 111 Fuzzy, clammy, blurry vision assisted to recliner.   Recliner     BP 84/56  Performed a few TE's while in recliner.   Rica Koyanagi  PTA Acute  Rehabilitation Services Pager      [phone] Office      [phone]

## 2019-11-15 NOTE — Progress Notes (Signed)
Physical Therapy Treatment Patient Details Name: George [name] MRN: [id number] DOB: Aug 05, 1951 Today's Date: 11/15/2019    History of Present Illness s/p DA THA.PMH: including but not limited to-->femur fx, COPD, systolic CHF, HTN, nephrotic syndrome d/t Berger's dz, PSVTs, AAA    PT Comments    POD # 1 pm session Pt OOB in recliner.  Slight  improvement with Bolus Supine       BP 113/73  HR 77 Seated       BP 104/70  HR 101 Standing    BP 88/70    HR  108 3 min         BP 76/62    HR 111 Fuzzy, clammy, blurry vision assisted to recliner.   Recliner     BP 84/56 Unable to attempt further gait or stairs.  Performed a few TE's while in recliner.    Follow Up Recommendations  Follow surgeon's recommendation for DC plan and follow-up therapies     Equipment Recommendations  None recommended by PT    Recommendations for Other Services       Precautions / Restrictions Precautions Precautions: Fall Precaution Comments: monitor BP Restrictions Weight Bearing Restrictions: No Other Position/Activity Restrictions: WBAT    Mobility  Bed Mobility Overal bed mobility: Needs Assistance Bed Mobility: Supine to Sit     Supine to sit: Min guard     General bed mobility comments: OOB in recliner  Transfers Overall transfer level: Needs assistance Equipment used: Rolling walker (2 wheeled) Transfers: Sit to/from Stand Sit to Stand: Min assist         General transfer comment: cues for hand placement to avoid pulling up on walker.  Ambulation/Gait Ambulation/Gait assistance: Min assist Gait Distance (Feet): 2 Feet Assistive device: Rolling walker (2 wheeled) Gait Pattern/deviations: Step-to pattern;Decreased step length - right;Decreased step length - left Gait velocity: decreased   General Gait Details: amb distance limted due to positive orthostatic   Stairs             Wheelchair Mobility    Modified Rankin (Stroke Patients Only)       Balance                                             Cognition Arousal/Alertness: Awake/alert Behavior During Therapy: WFL for tasks assessed/performed Overall Cognitive Status: Within Functional Limits for tasks assessed                                        Exercises   Total Hip Replacement TE's following HEP Handout 10 reps ankle pumps 05 reps knee presses 05 reps heel slides 05 reps SAQ's 05 reps ABD Instructed how to use a belt loop to assist  Followed by ICE     General Comments        Pertinent Vitals/Pain Pain Assessment: 0-10 Pain Score: 3  Pain Location: left hip Pain Descriptors / Indicators: Discomfort;Aching;Tender;Operative site guarding Pain Intervention(s): Monitored during session;Premedicated before session;Repositioned;Ice applied    Home Living                      Prior Function            PT Goals (current goals can now be found in the care plan  section) Progress towards PT goals: Progressing toward goals    Frequency    7X/week      PT Plan Current plan remains appropriate    Co-evaluation              AM-PAC PT "6 Clicks" Mobility   Outcome Measure  Help needed turning from your back to your side while in a flat bed without using bedrails?: A Little Help needed moving from lying on your back to sitting on the side of a flat bed without using bedrails?: A Little Help needed moving to and from a bed to a chair (including a wheelchair)?: A Little Help needed standing up from a chair using your arms (e.g., wheelchair or bedside chair)?: A Little Help needed to walk in hospital room?: A Lot Help needed climbing 3-5 steps with a railing? : A Lot 6 Click Score: 16    End of Session Equipment Utilized During Treatment: Gait belt Activity Tolerance: Other (comment)(orthostatic) Patient left: in chair;with call bell/phone within reach;with chair alarm set Nurse Communication: Mobility status PT  Visit Diagnosis: Difficulty in walking, not elsewhere classified (R26.2);Other abnormalities of gait and mobility (R26.89)     Time: [id number] PT Time Calculation (min) (ACUTE ONLY): 28 min  Charges:  $Gait Training: 8-22 mins $Therapeutic Activity: 8-22 mins                     Rica Koyanagi  PTA Acute  Rehabilitation Services Pager      [phone] Office      [phone]

## 2019-11-15 NOTE — Progress Notes (Signed)
PATIENT ID: George [name]  MRN: [id number]  DOB/AGE:  71/20/1952 / 71 y.o.  1 Day Post-Op Procedure(s) (LRB): LEFT TOTAL HIP ARTHROPLASTY ANTERIOR APPROACH (Left)    PROGRESS NOTE Subjective: Patient is alert, oriented, no Nausea, no Vomiting, yes passing gas, . Taking PO well. Denies SOB, Chest or Calf Pain. Using Incentive Spirometer, PAS in place. Ambulate 15 Patient reports pain as  2/10  .    Objective: Vital signs in last 24 hours: Vitals:   11/14/19 2346 11/14/19 2349 11/15/19 0123 11/15/19 0517  BP: 104/62 108/68 100/60 108/79  Pulse: 77 77 73 73  Resp: 18  17 18   Temp: 97.6 F (36.4 C)  97.8 F (36.6 C) (Abnormal) 97.5 F (36.4 C)  TempSrc: Oral  Oral Oral  SpO2: 99%  99% 99%  Weight:          Intake/Output from previous day: I/O last 3 completed shifts: In: 4034.3 [P.O.:720; I.V.:3254.4; IV Piggyback:59.9] Out: 1805 [Urine:1305; Blood:500]   Intake/Output this shift: No intake/output data recorded.   LABORATORY DATA: Recent Labs    11/15/19 0315  WBC 11.7*  HGB 8.1*  HCT 26.4*  PLT 176  NA 132*  K 4.8  CL 104  CO2 22  BUN 19  CREATININE 1.50*  GLUCOSE 165*  CALCIUM 8.3*    Examination: Neurologically intact ABD soft Neurovascular intact Sensation intact distally Intact pulses distally Dorsiflexion/Plantar flexion intact Incision: dressing C/D/I No cellulitis present Compartment soft} Thigh is soft with no significant swelling XR AP&Lat of hip shows well placed\fixed THA  Assessment:   1 Day Post-Op Procedure(s) (LRB): LEFT TOTAL HIP ARTHROPLASTY ANTERIOR APPROACH (Left) ADDITIONAL DIAGNOSIS:  Expected Acute Blood Loss Anemia, Hypertension, history of congestive heart failure,History of tobacco abuse 150-pack-year,History of liver fibrosis,COPD, Buerger's disease, nephropathy,  Patient's anticipated LOS is less than 2 midnights, meeting these requirements: - Younger than 55 - Lives within 1 hour of care - Has a competent adult at home  to recover with post-op recover - NO history of  - Chronic pain requiring opiods  - Diabetes  - Coronary Artery Disease  - Heart failure  - Heart attack  - Stroke  - DVT/VTE  - Cardiac arrhythmia  - Respiratory Failure/COPD  - Renal failure  - Anemia  - Advanced Liver disease       Plan: PT/OT WBAT, THA  DVT Prophylaxis: SCDx72 hrs, ASA 81 mg BID x 2 weeks  DISCHARGE PLAN: Home ,Today if he is able to pass physical therapy goals. DISCHARGE NEEDS: HHPT, Walker and 3-in-1 comode seatPatient ID: George [name], male   DOB: 02/12/1951, 69 y.o.   MRN: [id number]

## 2019-11-15 NOTE — Progress Notes (Signed)
PHYSICAL Therapy  Pt was positive dizzy, "fuzzy" vision with amb 10 feet  this morning.  Also noted to become pale and shaky.  Reported to RN.    EOB              BP 133/83   HR 102 Standing        BP 81/63  HR 120 Amb 10 feet   BP 65/53 HR 129  Dizzy,fuzzy,shaky assisted to recliner Back in recliner B LE elevated with head semi down BP 103/86    HR [place]  PTA Acute  Rehabilitation Services Pager      [phone] Office      [phone]

## 2019-11-16 LAB — CBC
HCT: 23 % — ABNORMAL LOW (ref 39.0–52.0)
Hemoglobin: 7.2 g/dL — ABNORMAL LOW (ref 13.0–17.0)
MCH: 28.7 pg (ref 26.0–34.0)
MCHC: 31.3 g/dL (ref 30.0–36.0)
MCV: 91.6 fL (ref 80.0–100.0)
Platelets: 165 10*3/uL (ref 150–400)
RBC: 2.51 MIL/uL — ABNORMAL LOW (ref 4.22–5.81)
RDW: 18.6 % — ABNORMAL HIGH (ref 11.5–15.5)
WBC: 8.1 10*3/uL (ref 4.0–10.5)
nRBC: 0 % (ref 0.0–0.2)

## 2019-11-16 LAB — PREPARE RBC (CROSSMATCH)

## 2019-11-16 MED ORDER — SODIUM CHLORIDE 0.9% IV SOLUTION: Freq: Once | INTRAVENOUS | Status: DC

## 2019-11-16 NOTE — TOC Transition Note (Signed)
Transition of Care [facility]) - CM/SW Discharge Note   Patient Details  Name: George [name] MRN: [id number] Date of Birth: [birth year]  Transition of Care [facility]) CM/SW Contact:  Lennart Pall, LCSW Phone Number: 11/16/2019, 9:31 AM   Clinical Narrative:  Pt likely for d/c today after transfusion.  Has all needed DME.  Have alerted Amy with [facility] of probable d/c.  No further needs.     Final next level of care: [place] Barriers to Discharge: Barriers Resolved   Patient Goals and CMS Choice Patient states their goals for this hospitalization and ongoing recovery are:: to go home CMS Medicare.gov Compare Post Acute Care list provided to:: Patient Choice offered to / list presented to : Patient  Discharge Placement                       Discharge Plan and Services In-house Referral: Clinical Social Work   Post Acute Care Choice: Durable Medical Equipment, Home Health          DME Arranged: N/A(has all needed DME)         HH Arranged: PT HH Agency: [facility] Date [place]: 11/16/19 Time [place]: [phone] Representative spoke with at [place]: [place] ([place]) Interventions     Readmission Risk Interventions Readmission Risk Prevention Plan 11/16/2019  Post Dischage Appt Complete  Medication Screening Complete  Transportation Screening Complete  Some recent data might be hidden

## 2019-11-16 NOTE — Progress Notes (Signed)
PATIENT ID: George [name]  MRN: [id number]  DOB/AGE:  Jul 28, 1951 / 69 y.o.  2 Days Post-Op Procedure(s) (LRB): LEFT TOTAL HIP ARTHROPLASTY ANTERIOR APPROACH (Left)    PROGRESS NOTE Subjective: Patient is alert, oriented, no Nausea, no Vomiting, yes passing gas, . Taking PO well. Denies SOB, Chest or Calf Pain. Using Incentive Spirometer, PAS in place. Ambulate WBAT with pt walking 2 ft .  Pt was limited due to orthostatic hypotension. Patient reports pain as 5-6/10  .    Objective: Vital signs in last 24 hours: Vitals:   11/15/19 1114 11/15/19 1327 11/15/19 2026 11/16/19 0325  BP: 109/74 114/74 130/82 137/79  Pulse: 87 78 72 78  Resp:  18 14 18   Temp:  98.4 F (36.9 C) 98 F (36.7 C) 97.8 F (36.6 C)  TempSrc:  Oral Oral Oral  SpO2: 98% 98% 96% 99%  Weight:          Intake/Output from previous day: I/O last 3 completed shifts: In: 3445.8 [P.O.:1080; I.V.:1629.4; IV Piggyback:736.4] Out: 1655 [Urine:1655]   Intake/Output this shift: No intake/output data recorded.   LABORATORY DATA: Recent Labs    11/15/19 0315 11/15/19 0315 11/15/19 1733 11/16/19 0316  WBC 11.7*  --   --  8.1  HGB 8.1*   < > 8.2* 7.2*  HCT 26.4*   < > 26.0* 23.0*  PLT 176  --   --  165  NA 132*  --   --   --   K 4.8  --   --   --   CL 104  --   --   --   CO2 22  --   --   --   BUN 19  --   --   --   CREATININE 1.50*  --   --   --   GLUCOSE 165*  --   --   --   CALCIUM 8.3*  --   --   --    < > = values in this interval not displayed.    Examination: Neurologically intact Neurovascular intact Sensation intact distally Intact pulses distally Dorsiflexion/Plantar flexion intact Incision: dressing C/D/I and no drainage No cellulitis present Compartment soft} XR AP&Lat of hip shows well placed\fixed THA  Assessment:   2 Days Post-Op Procedure(s) (LRB): LEFT TOTAL HIP ARTHROPLASTY ANTERIOR APPROACH (Left) ADDITIONAL DIAGNOSIS:  Expected Acute Blood Loss Anemia, Hypertension, history of  congestive heart failure,History of tobacco abuse 150-pack-year,History of liver fibrosis,COPD, Buerger's disease, nephropathy  Anticipated LOS equal to or greater than 2 midnights due to - Age 50 and older with one or more of the following:  - Obesity  - Expected need for hospital services (PT, OT, Nursing) required for safe  discharge  - Anticipated need for postoperative skilled nursing care or inpatient rehab   OR   - Unanticipated findings during/Post Surgery: Slow post-op progression: GI, pain control, mobility      Plan: PT/OT WBAT, THA  DVT Prophylaxis: SCDx72 hrs, ASA 81 mg BID x 2 weeks  DISCHARGE PLAN: Home, when pt meets therapy goals  DISCHARGE NEEDS: HHPT, Walker and 3-in-1 comode seat

## 2019-11-16 NOTE — Progress Notes (Signed)
PHYSICAL THERAPY  Pt completed one unit so assisted OOB  Supine       BP 117/71   HR 85 EOB          BP 12-/79    HR 105 Standing    BP 135/71   HR 105 3 min walk BP 92/44     HR 126   A little fuzzy recliner brought to pt  Improvement.  Pt set to receive another unit.  Left pt OOB in recliner and performed some TE's.   Rica Koyanagi  PTA Acute  Rehabilitation Services Pager      [phone] Office      [phone]

## 2019-11-16 NOTE — Progress Notes (Signed)
E phillips PA notified of patients slow progression with PT orders received. Bethann Punches RN

## 2019-11-16 NOTE — Progress Notes (Signed)
Physical Therapy Treatment Patient Details Name: George [name] MRN: [id number] DOB: 1951/02/11 Today's Date: 11/16/2019    History of Present Illness s/p DA THA.PMH: including but not limited to-->femur fx, COPD, systolic CHF, HTN, nephrotic syndrome d/t Berger's dz, PSVTs, AAA    PT Comments    POD # 2 (first session after one unit blood) Assisted OOB.  General bed mobility comments: demonstarted and instructed how to use a gait belt to self assist LE off bed.  General transfer comment: cues for hand placement to avoid pulling up on walker. Tolerated an increased distance before c/o feeling "fuzzy".   Pt completed one unit so assisted OOB  Supine       BP 117/71   HR 85 EOB          BP 12-/79    HR 105 Standing    BP 135/71   HR 105 3 min walk BP 92/44     HR 126   A little fuzzy recliner brought to pt  Improvement.  Pt set to receive another unit.  Left pt OOB in recliner and performed some TE's.    Follow Up Recommendations  Follow surgeon's recommendation for DC plan and follow-up therapies     Equipment Recommendations  None recommended by PT    Recommendations for Other Services       Precautions / Restrictions Precautions Precautions: Fall Precaution Comments: monitor BP Restrictions Weight Bearing Restrictions: No Other Position/Activity Restrictions: WBAT    Mobility  Bed Mobility Overal bed mobility: Needs Assistance Bed Mobility: Supine to Sit     Supine to sit: Min guard     General bed mobility comments: demonstarted and instructed how to use a gait belt to self assist LE off bed  Transfers Overall transfer level: Needs assistance Equipment used: Rolling walker (2 wheeled) Transfers: Sit to/from Stand Sit to Stand: Min assist;Min guard         General transfer comment: cues for hand placement to avoid pulling up on walker.  Ambulation/Gait Ambulation/Gait assistance: Min assist Gait Distance (Feet): 18 Feet Assistive device: Rolling  walker (2 wheeled) Gait Pattern/deviations: Step-to pattern;Decreased step length - right;Decreased step length - left Gait velocity: decreased   General Gait Details: amb distance limted due to positive orthostatic after one unit.  Scheduled to receive another.   Stairs             Wheelchair Mobility    Modified Rankin (Stroke Patients Only)       Balance                                            Cognition   Behavior During Therapy: WFL for tasks assessed/performed Overall Cognitive Status: Within Functional Limits for tasks assessed                                        Exercises   Total Knee Replacement TE's following HEP handout 10 reps B LE ankle pumps 05 reps towel squeezes 05 reps knee presses 05 reps heel slides  05 reps SAQ's 05 reps SLR's 05 reps ABD Educated on use of gait belt to assist with TE's Followed by ICE     General Comments        Pertinent Vitals/Pain Pain Assessment: 0-10  Pain Score: 5  Pain Location: left hip Pain Descriptors / Indicators: Discomfort;Aching;Tender;Operative site guarding Pain Intervention(s): Monitored during session;Premedicated before session;Repositioned;Ice applied    Home Living                      Prior Function            PT Goals (current goals can now be found in the care plan section) Progress towards PT goals: Progressing toward goals    Frequency    7X/week      PT Plan Current plan remains appropriate    Co-evaluation              AM-PAC PT "6 Clicks" Mobility   Outcome Measure  Help needed turning from your back to your side while in a flat bed without using bedrails?: A Little Help needed moving from lying on your back to sitting on the side of a flat bed without using bedrails?: A Little Help needed moving to and from a bed to a chair (including a wheelchair)?: A Little Help needed standing up from a chair using your arms  (e.g., wheelchair or bedside chair)?: A Little Help needed to walk in hospital room?: A Little Help needed climbing 3-5 steps with a railing? : A Lot 6 Click Score: 17    End of Session Equipment Utilized During Treatment: Gait belt Activity Tolerance: Treatment limited secondary to medical complications (Comment) Patient left: in chair;with call bell/phone within reach;with chair alarm set Nurse Communication: Mobility status PT Visit Diagnosis: Difficulty in walking, not elsewhere classified (R26.2);Other abnormalities of gait and mobility (R26.89)     Time: 1315-1340 PT Time Calculation (min) (ACUTE ONLY): 25 min  Charges:  $Gait Training: 8-22 mins $Therapeutic Exercise: 8-22 mins                     Rica Koyanagi  PTA Acute  Rehabilitation Services Pager      [phone] Office      [phone]

## 2019-11-16 NOTE — Progress Notes (Signed)
PHYSICAL THERAPY  Pt still receiving his second unit of blood.  Based on this mornings performance, pt will need the full 2 units before we can safely attempt stairs.  Pt lives home alone and plans to "have a sister help"? But have not seen any family here today.    Pt not meeting goals for a safe D/C today.  Rica Koyanagi  PTA Acute  Rehabilitation Services Pager      [phone] Office      [phone]

## 2019-11-16 NOTE — Discharge Summary (Addendum)
Patient ID: George [name] MRN: [id number] DOB/AGE: [birth year] 69 y.o.  Admit date: 11/14/2019 Discharge date: 11/17/2019  Admission Diagnoses:  Active Problems:   Status post total replacement of left hip   H/O total hip arthroplasty, left   Discharge Diagnoses:  Same  Past Medical History:  Diagnosis Date  . AAA (abdominal aortic aneurysm) ([place])    a. [place] 09/2015 - 3cm distal AAA. //  b. [place] 5/17: 3.1 cm   . Basal cell carcinoma of right side of nose   . Berger's disease   . CKD (chronic kidney disease) 09/2015  . COPD (chronic obstructive pulmonary disease) ([place])   . GERD (gastroesophageal reflux disease)   . Nephrotic syndrome   . Panic attacks   . Sleep apnea   . SVT (supraventricular tachycardia) ([place]) 05/22/2015    Surgeries: Procedure(s): LEFT TOTAL HIP ARTHROPLASTY ANTERIOR APPROACH on 11/14/2019   Consultants:   Discharged Condition: Improved  Hospital Course: AVIMAEL GRISCOM is an 69 y.o. male who was admitted 11/14/2019 for operative treatment of<principal problem not specified>. Patient has severe unremitting pain that affects sleep, daily activities, and work/hobbies. After pre-op clearance the patient was taken to the operating room on 11/14/2019 and underwent  Procedure(s): LEFT TOTAL HIP ARTHROPLASTY ANTERIOR APPROACH.    Patient was given perioperative antibiotics:  Anti-infectives (From admission, onward)   Start     Dose/Rate Route Frequency Ordered Stop   11/14/19 0645  ceFAZolin (ANCEF) IVPB 2g/100 mL premix     2 g 200 mL/hr over 30 Minutes Intravenous On call to O.R. 11/14/19 0644 11/14/19 0900   11/14/19 0600  vancomycin (VANCOREADY) IVPB 1500 mg/300 mL     1,500 mg 150 mL/hr over 120 Minutes Intravenous STAT 11/13/19 0942 11/14/19 1004       Patient was given sequential compression devices, early ambulation, and chemoprophylaxis to prevent DVT.  Patient benefited maximally from hospital stay and there were no complications.    Recent vital signs:   Patient Vitals for the past 24 hrs:  BP Temp Temp src Pulse Resp SpO2  11/17/19 0548 133/86 98.1 F (36.7 C) Oral 79 18 100 %  11/16/19 2146 125/71 98.2 F (36.8 C) Oral 73 18 97 %  11/16/19 1731 112/78 98 F (36.7 C) -- 87 18 98 %  11/16/19 1445 120/78 98.2 F (36.8 C) Oral 91 20 97 %  11/16/19 1412 112/72 98.4 F (36.9 C) Oral 95 18 97 %  11/16/19 1258 123/66 97.8 F (36.6 C) Oral 80 20 97 %     Recent laboratory studies:  Recent Labs    11/15/19 0315 11/15/19 1733 11/16/19 0316 11/17/19 0318  WBC 11.7*  --  8.1 5.8  HGB 8.1*   < > 7.2* 8.1*  HCT 26.4*   < > 23.0* 25.5*  PLT 176  --  165 142*  NA 132*  --   --   --   K 4.8  --   --   --   CL 104  --   --   --   CO2 22  --   --   --   BUN 19  --   --   --   CREATININE 1.50*  --   --   --   GLUCOSE 165*  --   --   --   CALCIUM 8.3*  --   --   --    < > = values in this interval not displayed.     Discharge  Medications:   Allergies as of 11/17/2019   No Known Allergies     Medication List    STOP taking these medications   acetaminophen 500 MG tablet Commonly known as: TYLENOL   HYDROcodone-acetaminophen 7.5-325 MG tablet Commonly known as: NORCO     TAKE these medications   aspirin EC 81 MG tablet Take 1 tablet (81 mg total) by mouth 2 (two) times daily. What changed: when to take this   citalopram 40 MG tablet Commonly known as: CELEXA TAKE 1 TABLET BY MOUTH DAILY.   losartan 50 MG tablet Commonly known as: COZAAR TAKE 1 TABLET (50 MG TOTAL) BY MOUTH DAILY.   montelukast 10 MG tablet Commonly known as: SINGULAIR TAKE 1 TABLET BY MOUTH AT BEDTIME. FOR BREATHING/ALLERGIES What changed: See the new instructions.   oxyCODONE-acetaminophen 5-325 MG tablet Commonly known as: PERCOCET/ROXICET Take 1 tablet by mouth every 4 (four) hours as needed for severe pain.   rosuvastatin 40 MG tablet Commonly known as: Crestor Take 0.5 tablets (20 mg total) by mouth at bedtime.   tiZANidine 2 MG  tablet Commonly known as: ZANAFLEX Take 1 tablet (2 mg total) by mouth every 6 (six) hours as needed.   Vitamin D (Ergocalciferol) 1.25 MG (50000 UNIT) Caps capsule Commonly known as: DRISDOL Take one tablet wkly What changed:   how much to take  how to take this  when to take this  additional instructions   Vitamin D3 125 MCG (5000 UT) Caps Take 5,000 Units by mouth daily.   zolpidem 12.5 MG CR tablet Commonly known as: AMBIEN CR TAKE 1 TABLET BY MOUTH AT BEDTIME AS NEEDED FOR SLEEP What changed:   reasons to take this  additional instructions            Durable Medical Equipment  (From admission, onward)         Start     Ordered   11/14/19 1244  DME Walker rolling  Once    Question:  Patient needs a walker to treat with the following condition  Answer:  Status post total hip replacement, left   11/14/19 1243   11/14/19 1244  DME 3 n 1  Once     11/14/19 1243           Discharge Care Instructions  (From admission, onward)         Start     Ordered   11/16/19 0000  Weight bearing as tolerated     11/16/19 0843          Diagnostic Studies: DG Chest 2 View  Result Date: 11/10/2019 CLINICAL DATA:  Preoperative examination. Patient for left hip replacement. EXAM: CHEST - 2 VIEW COMPARISON:  PA and lateral chest 09/21/2015 and single view of the chest 05/22/2015. FINDINGS: The lungs are emphysematous but clear. No pneumothorax or pleural effusion. Heart size is normal. No acute or focal bony abnormality. IMPRESSION: Emphysema without acute disease. Electronically Signed   By: Inge Rise M.D.   On: 11/10/2019 16:14   DG C-Arm 1-60 Min-No Report  Result Date: 11/14/2019 CLINICAL DATA:  Total hip replacement on the left EXAM: OPERATIVE LEFT HIP (WITH PELVIS IF PERFORMED) 1 VIEW TECHNIQUE: Fluoroscopic spot image(s) were submitted for interpretation post-operatively. FLUOROSCOPY TIME:  0 minutes 19 seconds; 2 acquired images COMPARISON:  None.  FINDINGS: Frontal view of the lower pelvis and frontal view of left hip obtained. There is a total hip replacement on the left with prosthetic components well-seated. No fracture or  dislocation. There is slight protrusio acetabula on the left. There is slight narrowing of the right hip joint. IMPRESSION: Total hip replacement on the left with prosthetic components well-seated on frontal view. No fracture or dislocation. 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 Mild narrowing right hip joint on frontal view. Electronically Signed   By: Lowella Grip III M.D.   On: 11/14/2019 10:43   DG HIP OPERATIVE UNILAT WITH PELVIS LEFT  Result Date: 11/14/2019 CLINICAL DATA:  Total hip replacement on the left EXAM: OPERATIVE LEFT HIP (WITH PELVIS IF PERFORMED) 1 VIEW TECHNIQUE: Fluoroscopic spot image(s) were submitted for interpretation post-operatively. FLUOROSCOPY TIME:  0 minutes 19 seconds; 2 acquired images COMPARISON:  None. FINDINGS: Frontal view of the lower pelvis and frontal view of left hip obtained. There is a total hip replacement on the left with prosthetic components well-seated. No fracture or dislocation. There is slight protrusio acetabula on the left. There is slight narrowing of the right hip joint. IMPRESSION: Total hip replacement on the left with prosthetic components well-seated on frontal view. No fracture or dislocation. 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 Mild narrowing right hip joint on frontal view. Electronically Signed   By: Lowella Grip III M.D.   On: 11/14/2019 10:43    Disposition: Discharge disposition: 01-Home or Self Care       Discharge Instructions    Call MD / Call 911   Complete by: As directed    If you experience chest pain or shortness of breath, CALL 911 and be transported to the hospital emergency room.  If you develope a fever above 101 F, pus (white drainage) or increased drainage or redness at the wound, or calf pain, call your surgeon's office.    Constipation Prevention   Complete by: As directed    Drink plenty of fluids.  Prune juice may be helpful.  You may use a stool softener, such as Colace (over the counter) 100 mg twice a day.  Use MiraLax (over the counter) for constipation as needed.   Diet - low sodium heart healthy   Complete by: As directed    Driving restrictions   Complete by: As directed    No driving for 2 weeks   Increase activity slowly as tolerated   Complete by: As directed    Patient may shower   Complete by: As directed    You may shower without a dressing once there is no drainage.  Do not wash over the wound.  If drainage remains, cover wound with plastic wrap and then shower.   Weight bearing as tolerated   Complete by: As directed       Follow-up Information    Frederik Pear, MD. Go on 11/24/2019.   Specialty: Orthopedic Surgery Why: Your appointment has been scheduled for 9:30 Contact information: [place] [address] [phone]        [facility] Go on 11/24/2019.   Why: You are scheduled to start outpatient physical therapy at 10:40.  Contact information: Physical Therapy [place] [address] [phone]        [facility] Follow up.   Specialty: [place] Why: You will be seen at home for 5 HHPT visits prior to starting Outpatient physical therapy  Contact information: [place] [id number] [phone]            Signed: Joanell Rising 11/17/2019, 12:50 PM

## 2019-11-17 ENCOUNTER — Other Ambulatory Visit: Payer: Self-pay

## 2019-11-17 LAB — TYPE AND SCREEN
ABO/RH(D): A POS
Antibody Screen: POSITIVE
Donor AG Type: NEGATIVE
Donor AG Type: NEGATIVE
Unit division: 0
Unit division: 0

## 2019-11-17 LAB — BPAM RBC
Blood Product Expiration Date: 202104192359
Blood Product Expiration Date: 202104192359
ISSUE DATE / TIME: 202104071000
ISSUE DATE / TIME: 202104071420
Unit Type and Rh: 6200
Unit Type and Rh: 6200

## 2019-11-17 LAB — CBC
HCT: 25.5 % — ABNORMAL LOW (ref 39.0–52.0)
Hemoglobin: 8.1 g/dL — ABNORMAL LOW (ref 13.0–17.0)
MCH: 29.5 pg (ref 26.0–34.0)
MCHC: 31.8 g/dL (ref 30.0–36.0)
MCV: 92.7 fL (ref 80.0–100.0)
Platelets: 142 10*3/uL — ABNORMAL LOW (ref 150–400)
RBC: 2.75 MIL/uL — ABNORMAL LOW (ref 4.22–5.81)
RDW: 18.1 % — ABNORMAL HIGH (ref 11.5–15.5)
WBC: 5.8 10*3/uL (ref 4.0–10.5)
nRBC: 0 % (ref 0.0–0.2)

## 2019-11-17 NOTE — Progress Notes (Signed)
Physical Therapy Treatment Patient Details Name: George [name] MRN: [id number] DOB: [birth year] Today's Date: 11/17/2019    History of Present Illness s/p DA THA.PMH: including but not limited to-->femur fx, COPD, systolic CHF, HTN, nephrotic syndrome d/t Berger's dz, PSVTs, AAA    PT Comments    POD # 3 second session Practiced stairs with sister and also had sister "hands on" assist pt with amb a functional household distance. Pt tolerated well.  Standing BP after session was 116/72  HR 119.  Pt has met goals to safely D/C to home today.  Sister plans to assist pt.  Addressed all mobility questions, discussed appropriate activity, educated on use of ICE.  Pt ready for D/C to home.   Follow Up Recommendations  Follow surgeon's recommendation for DC plan and follow-up therapies     Equipment Recommendations  None recommended by PT    Recommendations for Other Services       Precautions / Restrictions Precautions Precautions: Fall Precaution Comments: monitor BP Restrictions Other Position/Activity Restrictions: WBAT    Mobility  Bed Mobility Overal bed mobility: Needs Assistance Bed Mobility: Supine to Sit     Supine to sit: Min guard     General bed mobility comments: pt OOB in recliner  Transfers Overall transfer level: Needs assistance Equipment used: Rolling walker (2 wheeled) Transfers: Sit to/from Stand Sit to Stand: Supervision;Min guard         General transfer comment: cues for hand placement to avoid pulling up on walker.  Had sister "hands on" asssit.  Ambulation/Gait Ambulation/Gait assistance: Supervision;Min guard Gait Distance (Feet): 48 Feet Assistive device: Rolling walker (2 wheeled) Gait Pattern/deviations: Step-to pattern;Decreased step length - right;Decreased step length - left Gait velocity: decreased   General Gait Details: amb with sister "hands on" with instruction on safe handling.  Tolerated a functional house hold distance.  No  c/o's.   Stairs Stairs: Yes Stairs assistance: Supervision;Min guard Stair Management: Two rails;Forwards Number of Stairs: 2 General stair comments: with sister present for "hands on " instruction and 25% VC's on proper sequencing/safe handling   Wheelchair Mobility    Modified Rankin (Stroke Patients Only)       Balance                                            Cognition   Behavior During Therapy: WFL for tasks assessed/performed Overall Cognitive Status: Within Functional Limits for tasks assessed                                        Exercises      General Comments        Pertinent Vitals/Pain Pain Assessment: 0-10 Pain Score: 5  Pain Location: left hip Pain Descriptors / Indicators: Discomfort;Aching;Tender;Operative site guarding Pain Intervention(s): Monitored during session;Premedicated before session;Repositioned;Ice applied    Home Living                      Prior Function            PT Goals (current goals can now be found in the care plan section) Progress towards PT goals: Progressing toward goals    Frequency    7X/week      PT Plan Current plan remains appropriate  Co-evaluation              AM-PAC PT "6 Clicks" Mobility   Outcome Measure  Help needed turning from your back to your side while in a flat bed without using bedrails?: A Little Help needed moving from lying on your back to sitting on the side of a flat bed without using bedrails?: A Little Help needed moving to and from a bed to a chair (including a wheelchair)?: A Little Help needed standing up from a chair using your arms (e.g., wheelchair or bedside chair)?: A Little Help needed to walk in hospital room?: A Little Help needed climbing 3-5 steps with a railing? : A Lot 6 Click Score: 17    End of Session Equipment Utilized During Treatment: Gait belt Activity Tolerance: Patient tolerated treatment  well Patient left: in chair;with call bell/phone within reach;with chair alarm set;with family/visitor present Nurse Communication: Mobility status(pt did better, sister is here.  Will need another quick second session to pratice stairs.) PT Visit Diagnosis: Difficulty in walking, not elsewhere classified (R26.2);Other abnormalities of gait and mobility (R26.89)     Time: 8719-9412 PT Time Calculation (min) (ACUTE ONLY): 15 min  Charges:  $Gait Training: 8-22 mins $Therapeutic Activity: 8-22 mins                     Rica Koyanagi  PTA Acute  Rehabilitation Services Pager      [phone] Office      [phone]

## 2019-11-17 NOTE — Progress Notes (Signed)
PATIENT ID: George [name]  MRN: [id number]  DOB/AGE:  71-06-1951 / 69 y.o.  3 Days Post-Op Procedure(s) (LRB): LEFT TOTAL HIP ARTHROPLASTY ANTERIOR APPROACH (Left)    PROGRESS NOTE Subjective: Patient is alert, oriented, no Nausea, no Vomiting, yes passing gas, . Taking PO well. Denies SOB, Chest or Calf Pain. Using Incentive Spirometer, PAS in place. Ambulate WBAT with therapy limited yesterday due to receiving transfusion. Patient reports pain as  5/10  .    Objective: Vital signs in last 24 hours: Vitals:   11/16/19 1445 11/16/19 1731 11/16/19 2146 11/17/19 0548  BP: 120/78 112/78 125/71 133/86  Pulse: 91 87 73 79  Resp: 20 18 18 18   Temp: 98.2 F (36.8 C) 98 F (36.7 C) 98.2 F (36.8 C) 98.1 F (36.7 C)  TempSrc: Oral  Oral Oral  SpO2: 97% 98% 97% 100%  Weight:          Intake/Output from previous day: I/O last 3 completed shifts: In: 1864.7 [P.O.:1140; I.V.:32.7; Blood:692] Out: 2020 [Urine:2020]   Intake/Output this shift: No intake/output data recorded.   LABORATORY DATA: Recent Labs    11/15/19 0315 11/15/19 1733 11/16/19 0316 11/17/19 0318  WBC 11.7*  --  8.1 5.8  HGB 8.1*   < > 7.2* 8.1*  HCT 26.4*   < > 23.0* 25.5*  PLT 176  --  165 142*  NA 132*  --   --   --   K 4.8  --   --   --   CL 104  --   --   --   CO2 22  --   --   --   BUN 19  --   --   --   CREATININE 1.50*  --   --   --   GLUCOSE 165*  --   --   --   CALCIUM 8.3*  --   --   --    < > = values in this interval not displayed.    Examination: Neurologically intact Neurovascular intact Sensation intact distally Intact pulses distally Dorsiflexion/Plantar flexion intact Incision: dressing C/D/I and no drainage No cellulitis present Compartment soft} XR AP&Lat of hip shows well placed\fixed THA  Assessment:   3 Days Post-Op Procedure(s) (LRB): LEFT TOTAL HIP ARTHROPLASTY ANTERIOR APPROACH (Left) ADDITIONAL DIAGNOSIS:  Expected Acute Blood Loss Anemia, Hypertension, history of  congestive heart failure,History of tobacco abuse 150-pack-year,History of liver fibrosis,COPD, Buerger's disease, nephropathy   Anticipated LOS equal to or greater than 2 midnights due to - Age 71 and older with one or more of the following:  - Obesity  - Expected need for hospital services (PT, OT, Nursing) required for safe  discharge  - Anticipated need for postoperative skilled nursing care or inpatient rehab   OR   - Unanticipated findings during/Post Surgery: Slow post-op progression: GI, pain control, mobility   Plan: PT/OT WBAT, THA  DVT Prophylaxis: SCDx72 hrs, ASA 81 mg BID x 2 weeks  DISCHARGE PLAN: Home, when passes therapy  DISCHARGE NEEDS: HHPT, Walker and 3-in-1 comode seat

## 2019-11-17 NOTE — Progress Notes (Signed)
Physical Therapy Treatment Patient Details Name: George [name] MRN: [id number] DOB: March 23, 1951 Today's Date: 11/17/2019    History of Present Illness s/p DA THA.PMH: including but not limited to-->femur fx, COPD, systolic CHF, HTN, nephrotic syndrome d/t Berger's dz, PSVTs, AAA    PT Comments    POD # 3 am session Pt feeling "much better".  Sister  Benjamine Mola in room.  Assisted OOB.  General bed mobility comments: demonstarted and instructed how to use a gait belt to self assist LE off bed.  General transfer comment: cues for hand placement to avoid pulling up on walker. General Gait Details: tolerated an increased, functional distance.  "Feeling better" than yesterday.  <255 VC's on activity tolerance and walking freq once home.  Supine      BP 152/85    HR 87 EOB         BP 127/79    HR 97 Standing   BP 102/60    HR 117 3 min        BP 91/57      HR 126 No c/o "fuzzy" or "dizzy" just fatigue "time to stop and rest".  Also instructed on Purse Lip breathing due to dyspnea 3/4 Hx  COPD.  Instructed pt to "do a little and rest a little".   Will allow pt to rest and come back a little later to practice stairs   Follow Up Recommendations  Follow surgeon's recommendation for DC plan and follow-up therapies     Equipment Recommendations  None recommended by PT    Recommendations for Other Services       Precautions / Restrictions Precautions Precautions: Fall Precaution Comments: monitor BP Restrictions Other Position/Activity Restrictions: WBAT    Mobility  Bed Mobility Overal bed mobility: Needs Assistance Bed Mobility: Supine to Sit     Supine to sit: Min guard     General bed mobility comments: demonstarted and instructed how to use a gait belt to self assist LE off bed  Transfers Overall transfer level: Needs assistance Equipment used: Rolling walker (2 wheeled) Transfers: Sit to/from Stand Sit to Stand: Supervision;Min guard         General transfer comment:  cues for hand placement to avoid pulling up on walker.  Ambulation/Gait Ambulation/Gait assistance: Supervision;Min guard Gait Distance (Feet): 55 Feet Assistive device: Rolling walker (2 wheeled) Gait Pattern/deviations: Step-to pattern;Decreased step length - right;Decreased step length - left Gait velocity: decreased   General Gait Details: tolerated an increased, functional distance.  "Feeling better" than yesterday.  <255 VC's on activity tolerance and walking freq once home.   Stairs             Wheelchair Mobility    Modified Rankin (Stroke Patients Only)       Balance                                            Cognition   Behavior During Therapy: WFL for tasks assessed/performed Overall Cognitive Status: Within Functional Limits for tasks assessed                                        Exercises      General Comments        Pertinent Vitals/Pain Pain Assessment: 0-10 Pain Score: 5  Pain Location: left hip Pain Descriptors / Indicators: Discomfort;Aching;Tender;Operative site guarding Pain Intervention(s): Monitored during session;Premedicated before session;Repositioned;Ice applied    Home Living                      Prior Function            PT Goals (current goals can now be found in the care plan section) Progress towards PT goals: Progressing toward goals    Frequency    7X/week      PT Plan Current plan remains appropriate    Co-evaluation              AM-PAC PT "6 Clicks" Mobility   Outcome Measure  Help needed turning from your back to your side while in a flat bed without using bedrails?: A Little Help needed moving from lying on your back to sitting on the side of a flat bed without using bedrails?: A Little Help needed moving to and from a bed to a chair (including a wheelchair)?: A Little Help needed standing up from a chair using your arms (e.g., wheelchair or bedside  chair)?: A Little Help needed to walk in hospital room?: A Little Help needed climbing 3-5 steps with a railing? : A Lot 6 Click Score: 17    End of Session Equipment Utilized During Treatment: Gait belt Activity Tolerance: Patient tolerated treatment well Patient left: in chair;with call bell/phone within reach;with chair alarm set;with family/visitor present Nurse Communication: Mobility status(pt did better, sister is here.  Will need another quick second session to pratice stairs.) PT Visit Diagnosis: Difficulty in walking, not elsewhere classified (R26.2);Other abnormalities of gait and mobility (R26.89)     Time: [id number] PT Time Calculation (min) (ACUTE ONLY): 25 min  Charges:  $Gait Training: 8-22 mins $Therapeutic Activity: 8-22 mins                     Rica Koyanagi  PTA Acute  Rehabilitation Services Pager      [phone] Office      [phone]

## 2019-11-19 DIAGNOSIS — Z87891 Personal history of nicotine dependence: Secondary | ICD-10-CM | POA: Diagnosis not present

## 2019-11-19 DIAGNOSIS — Z471 Aftercare following joint replacement surgery: Secondary | ICD-10-CM | POA: Diagnosis not present

## 2019-11-19 DIAGNOSIS — F4323 Adjustment disorder with mixed anxiety and depressed mood: Secondary | ICD-10-CM | POA: Diagnosis not present

## 2019-11-19 DIAGNOSIS — Z6831 Body mass index (BMI) 31.0-31.9, adult: Secondary | ICD-10-CM | POA: Diagnosis not present

## 2019-11-19 DIAGNOSIS — I714 Abdominal aortic aneurysm, without rupture: Secondary | ICD-10-CM | POA: Diagnosis not present

## 2019-11-19 DIAGNOSIS — I731 Thromboangiitis obliterans [Buerger's disease]: Secondary | ICD-10-CM | POA: Diagnosis not present

## 2019-11-19 DIAGNOSIS — I130 Hypertensive heart and chronic kidney disease with heart failure and stage 1 through stage 4 chronic kidney disease, or unspecified chronic kidney disease: Secondary | ICD-10-CM | POA: Diagnosis not present

## 2019-11-19 DIAGNOSIS — I872 Venous insufficiency (chronic) (peripheral): Secondary | ICD-10-CM | POA: Diagnosis not present

## 2019-11-19 DIAGNOSIS — E669 Obesity, unspecified: Secondary | ICD-10-CM | POA: Diagnosis not present

## 2019-11-19 DIAGNOSIS — Z96642 Presence of left artificial hip joint: Secondary | ICD-10-CM | POA: Diagnosis not present

## 2019-11-19 DIAGNOSIS — I509 Heart failure, unspecified: Secondary | ICD-10-CM | POA: Diagnosis not present

## 2019-11-19 DIAGNOSIS — R2681 Unsteadiness on feet: Secondary | ICD-10-CM | POA: Diagnosis not present

## 2019-11-19 DIAGNOSIS — J449 Chronic obstructive pulmonary disease, unspecified: Secondary | ICD-10-CM | POA: Diagnosis not present

## 2019-11-23 DIAGNOSIS — R2681 Unsteadiness on feet: Secondary | ICD-10-CM | POA: Diagnosis not present

## 2019-11-23 DIAGNOSIS — Z96642 Presence of left artificial hip joint: Secondary | ICD-10-CM | POA: Diagnosis not present

## 2019-11-23 DIAGNOSIS — Z471 Aftercare following joint replacement surgery: Secondary | ICD-10-CM | POA: Diagnosis not present

## 2019-11-23 DIAGNOSIS — E669 Obesity, unspecified: Secondary | ICD-10-CM | POA: Diagnosis not present

## 2019-11-23 DIAGNOSIS — I130 Hypertensive heart and chronic kidney disease with heart failure and stage 1 through stage 4 chronic kidney disease, or unspecified chronic kidney disease: Secondary | ICD-10-CM | POA: Diagnosis not present

## 2019-11-23 DIAGNOSIS — Z6831 Body mass index (BMI) 31.0-31.9, adult: Secondary | ICD-10-CM | POA: Diagnosis not present

## 2019-11-24 DIAGNOSIS — M1612 Unilateral primary osteoarthritis, left hip: Secondary | ICD-10-CM | POA: Diagnosis not present

## 2019-11-25 DIAGNOSIS — Z96642 Presence of left artificial hip joint: Secondary | ICD-10-CM | POA: Diagnosis not present

## 2019-11-25 DIAGNOSIS — R2681 Unsteadiness on feet: Secondary | ICD-10-CM | POA: Diagnosis not present

## 2019-11-25 DIAGNOSIS — Z471 Aftercare following joint replacement surgery: Secondary | ICD-10-CM | POA: Diagnosis not present

## 2019-11-25 DIAGNOSIS — E669 Obesity, unspecified: Secondary | ICD-10-CM | POA: Diagnosis not present

## 2019-11-25 DIAGNOSIS — I130 Hypertensive heart and chronic kidney disease with heart failure and stage 1 through stage 4 chronic kidney disease, or unspecified chronic kidney disease: Secondary | ICD-10-CM | POA: Diagnosis not present

## 2019-11-25 DIAGNOSIS — Z6831 Body mass index (BMI) 31.0-31.9, adult: Secondary | ICD-10-CM | POA: Diagnosis not present

## 2019-11-28 DIAGNOSIS — M25652 Stiffness of left hip, not elsewhere classified: Secondary | ICD-10-CM | POA: Diagnosis not present

## 2019-11-28 DIAGNOSIS — Z96642 Presence of left artificial hip joint: Secondary | ICD-10-CM | POA: Diagnosis not present

## 2019-11-28 DIAGNOSIS — M6281 Muscle weakness (generalized): Secondary | ICD-10-CM | POA: Diagnosis not present

## 2019-12-02 DIAGNOSIS — M25562 Pain in left knee: Secondary | ICD-10-CM | POA: Diagnosis not present

## 2019-12-02 DIAGNOSIS — M6281 Muscle weakness (generalized): Secondary | ICD-10-CM | POA: Diagnosis not present

## 2019-12-02 DIAGNOSIS — Z96642 Presence of left artificial hip joint: Secondary | ICD-10-CM | POA: Diagnosis not present

## 2019-12-09 ENCOUNTER — Other Ambulatory Visit: Payer: Self-pay | Admitting: Family Medicine

## 2019-12-09 DIAGNOSIS — F4323 Adjustment disorder with mixed anxiety and depressed mood: Secondary | ICD-10-CM

## 2019-12-09 DIAGNOSIS — Z96652 Presence of left artificial knee joint: Secondary | ICD-10-CM | POA: Diagnosis not present

## 2019-12-09 DIAGNOSIS — M6281 Muscle weakness (generalized): Secondary | ICD-10-CM | POA: Diagnosis not present

## 2019-12-09 DIAGNOSIS — M25652 Stiffness of left hip, not elsewhere classified: Secondary | ICD-10-CM | POA: Diagnosis not present

## 2019-12-09 DIAGNOSIS — F411 Generalized anxiety disorder: Secondary | ICD-10-CM

## 2019-12-16 DIAGNOSIS — Z96642 Presence of left artificial hip joint: Secondary | ICD-10-CM | POA: Diagnosis not present

## 2019-12-16 DIAGNOSIS — M6281 Muscle weakness (generalized): Secondary | ICD-10-CM | POA: Diagnosis not present

## 2019-12-16 DIAGNOSIS — M25652 Stiffness of left hip, not elsewhere classified: Secondary | ICD-10-CM | POA: Diagnosis not present

## 2019-12-22 ENCOUNTER — Other Ambulatory Visit: Payer: Self-pay

## 2019-12-22 ENCOUNTER — Encounter (HOSPITAL_COMMUNITY): Payer: Self-pay

## 2019-12-22 ENCOUNTER — Observation Stay (HOSPITAL_COMMUNITY)
Admission: EM | Admit: 2019-12-22 | Discharge: 2019-12-23 | Disposition: A | Discharge status: Home / Self Care | Payer: Medicare Other | Attending: Internal Medicine | Admitting: Internal Medicine

## 2019-12-22 ENCOUNTER — Emergency Department (HOSPITAL_COMMUNITY): Payer: Medicare Other

## 2019-12-22 DIAGNOSIS — R5383 Other fatigue: Secondary | ICD-10-CM | POA: Diagnosis not present

## 2019-12-22 DIAGNOSIS — E1151 Type 2 diabetes mellitus with diabetic peripheral angiopathy without gangrene: Secondary | ICD-10-CM | POA: Diagnosis not present

## 2019-12-22 DIAGNOSIS — Z833 Family history of diabetes mellitus: Secondary | ICD-10-CM | POA: Insufficient documentation

## 2019-12-22 DIAGNOSIS — R000 Tachycardia, unspecified: Secondary | ICD-10-CM | POA: Diagnosis not present

## 2019-12-22 DIAGNOSIS — Z801 Family history of malignant neoplasm of trachea, bronchus and lung: Secondary | ICD-10-CM | POA: Insufficient documentation

## 2019-12-22 DIAGNOSIS — E1122 Type 2 diabetes mellitus with diabetic chronic kidney disease: Secondary | ICD-10-CM | POA: Insufficient documentation

## 2019-12-22 DIAGNOSIS — J449 Chronic obstructive pulmonary disease, unspecified: Secondary | ICD-10-CM | POA: Insufficient documentation

## 2019-12-22 DIAGNOSIS — R0902 Hypoxemia: Secondary | ICD-10-CM | POA: Diagnosis not present

## 2019-12-22 DIAGNOSIS — E781 Pure hyperglyceridemia: Secondary | ICD-10-CM | POA: Insufficient documentation

## 2019-12-22 DIAGNOSIS — R4182 Altered mental status, unspecified: Secondary | ICD-10-CM | POA: Diagnosis present

## 2019-12-22 DIAGNOSIS — K219 Gastro-esophageal reflux disease without esophagitis: Secondary | ICD-10-CM | POA: Insufficient documentation

## 2019-12-22 DIAGNOSIS — N179 Acute kidney failure, unspecified: Secondary | ICD-10-CM | POA: Diagnosis not present

## 2019-12-22 DIAGNOSIS — Z96642 Presence of left artificial hip joint: Secondary | ICD-10-CM | POA: Insufficient documentation

## 2019-12-22 DIAGNOSIS — Z6830 Body mass index (BMI) 30.0-30.9, adult: Secondary | ICD-10-CM | POA: Insufficient documentation

## 2019-12-22 DIAGNOSIS — Z7982 Long term (current) use of aspirin: Secondary | ICD-10-CM | POA: Insufficient documentation

## 2019-12-22 DIAGNOSIS — F411 Generalized anxiety disorder: Secondary | ICD-10-CM | POA: Insufficient documentation

## 2019-12-22 DIAGNOSIS — T50901A Poisoning by unspecified drugs, medicaments and biological substances, accidental (unintentional), initial encounter: Secondary | ICD-10-CM | POA: Diagnosis not present

## 2019-12-22 DIAGNOSIS — E860 Dehydration: Secondary | ICD-10-CM | POA: Diagnosis not present

## 2019-12-22 DIAGNOSIS — I10 Essential (primary) hypertension: Secondary | ICD-10-CM

## 2019-12-22 DIAGNOSIS — Z800 Family history of malignant neoplasm of digestive organs: Secondary | ICD-10-CM | POA: Insufficient documentation

## 2019-12-22 DIAGNOSIS — N028 Recurrent and persistent hematuria with other morphologic changes: Secondary | ICD-10-CM | POA: Insufficient documentation

## 2019-12-22 DIAGNOSIS — Z03818 Encounter for observation for suspected exposure to other biological agents ruled out: Secondary | ICD-10-CM | POA: Diagnosis not present

## 2019-12-22 DIAGNOSIS — R400 Somnolence: Secondary | ICD-10-CM | POA: Diagnosis not present

## 2019-12-22 DIAGNOSIS — E785 Hyperlipidemia, unspecified: Secondary | ICD-10-CM | POA: Insufficient documentation

## 2019-12-22 DIAGNOSIS — E669 Obesity, unspecified: Secondary | ICD-10-CM | POA: Insufficient documentation

## 2019-12-22 DIAGNOSIS — Y929 Unspecified place or not applicable: Secondary | ICD-10-CM | POA: Insufficient documentation

## 2019-12-22 DIAGNOSIS — G4730 Sleep apnea, unspecified: Secondary | ICD-10-CM | POA: Insufficient documentation

## 2019-12-22 DIAGNOSIS — T426X1A Poisoning by other antiepileptic and sedative-hypnotic drugs, accidental (unintentional), initial encounter: Secondary | ICD-10-CM | POA: Diagnosis not present

## 2019-12-22 DIAGNOSIS — Z79899 Other long term (current) drug therapy: Secondary | ICD-10-CM | POA: Diagnosis not present

## 2019-12-22 DIAGNOSIS — I509 Heart failure, unspecified: Secondary | ICD-10-CM | POA: Insufficient documentation

## 2019-12-22 DIAGNOSIS — T40601A Poisoning by unspecified narcotics, accidental (unintentional), initial encounter: Secondary | ICD-10-CM

## 2019-12-22 DIAGNOSIS — Z87891 Personal history of nicotine dependence: Secondary | ICD-10-CM | POA: Diagnosis not present

## 2019-12-22 DIAGNOSIS — G4700 Insomnia, unspecified: Secondary | ICD-10-CM | POA: Insufficient documentation

## 2019-12-22 DIAGNOSIS — I872 Venous insufficiency (chronic) (peripheral): Secondary | ICD-10-CM | POA: Insufficient documentation

## 2019-12-22 DIAGNOSIS — Z20822 Contact with and (suspected) exposure to covid-19: Secondary | ICD-10-CM | POA: Diagnosis not present

## 2019-12-22 DIAGNOSIS — I130 Hypertensive heart and chronic kidney disease with heart failure and stage 1 through stage 4 chronic kidney disease, or unspecified chronic kidney disease: Secondary | ICD-10-CM | POA: Insufficient documentation

## 2019-12-22 DIAGNOSIS — G92 Toxic encephalopathy: Secondary | ICD-10-CM | POA: Insufficient documentation

## 2019-12-22 DIAGNOSIS — Z85828 Personal history of other malignant neoplasm of skin: Secondary | ICD-10-CM | POA: Insufficient documentation

## 2019-12-22 DIAGNOSIS — R404 Transient alteration of awareness: Secondary | ICD-10-CM | POA: Diagnosis not present

## 2019-12-22 DIAGNOSIS — I714 Abdominal aortic aneurysm, without rupture: Secondary | ICD-10-CM | POA: Diagnosis not present

## 2019-12-22 DIAGNOSIS — T402X1A Poisoning by other opioids, accidental (unintentional), initial encounter: Principal | ICD-10-CM | POA: Insufficient documentation

## 2019-12-22 DIAGNOSIS — T50904A Poisoning by unspecified drugs, medicaments and biological substances, undetermined, initial encounter: Secondary | ICD-10-CM | POA: Diagnosis not present

## 2019-12-22 DIAGNOSIS — T887XXA Unspecified adverse effect of drug or medicament, initial encounter: Secondary | ICD-10-CM | POA: Diagnosis not present

## 2019-12-22 DIAGNOSIS — N183 Chronic kidney disease, stage 3 unspecified: Secondary | ICD-10-CM | POA: Diagnosis not present

## 2019-12-22 DIAGNOSIS — E559 Vitamin D deficiency, unspecified: Secondary | ICD-10-CM | POA: Insufficient documentation

## 2019-12-22 DIAGNOSIS — Z8249 Family history of ischemic heart disease and other diseases of the circulatory system: Secondary | ICD-10-CM | POA: Insufficient documentation

## 2019-12-22 DIAGNOSIS — K7400 Hepatic fibrosis, unspecified: Secondary | ICD-10-CM | POA: Diagnosis not present

## 2019-12-22 DIAGNOSIS — Z8349 Family history of other endocrine, nutritional and metabolic diseases: Secondary | ICD-10-CM | POA: Insufficient documentation

## 2019-12-22 DIAGNOSIS — E039 Hypothyroidism, unspecified: Secondary | ICD-10-CM | POA: Insufficient documentation

## 2019-12-22 LAB — CBC WITH DIFFERENTIAL/PLATELET
Abs Immature Granulocytes: 0.08 10*3/uL — ABNORMAL HIGH (ref 0.00–0.07)
Basophils Absolute: 0.1 10*3/uL (ref 0.0–0.1)
Basophils Relative: 1 %
Eosinophils Absolute: 0 10*3/uL (ref 0.0–0.5)
Eosinophils Relative: 0 %
HCT: 36 % — ABNORMAL LOW (ref 39.0–52.0)
Hemoglobin: 11.5 g/dL — ABNORMAL LOW (ref 13.0–17.0)
Immature Granulocytes: 1 %
Lymphocytes Relative: 5 %
Lymphs Abs: 0.7 10*3/uL (ref 0.7–4.0)
MCH: 30.5 pg (ref 26.0–34.0)
MCHC: 31.9 g/dL (ref 30.0–36.0)
MCV: 95.5 fL (ref 80.0–100.0)
Monocytes Absolute: 1.1 10*3/uL — ABNORMAL HIGH (ref 0.1–1.0)
Monocytes Relative: 9 %
Neutro Abs: 10.4 10*3/uL — ABNORMAL HIGH (ref 1.7–7.7)
Neutrophils Relative %: 84 %
Platelets: 263 10*3/uL (ref 150–400)
RBC: 3.77 MIL/uL — ABNORMAL LOW (ref 4.22–5.81)
RDW: 15.1 % (ref 11.5–15.5)
WBC: 12.3 10*3/uL — ABNORMAL HIGH (ref 4.0–10.5)
nRBC: 0 % (ref 0.0–0.2)

## 2019-12-22 LAB — COMPREHENSIVE METABOLIC PANEL
ALT: 10 U/L (ref 0–44)
AST: 45 U/L — ABNORMAL HIGH (ref 15–41)
Albumin: 4 g/dL (ref 3.5–5.0)
Alkaline Phosphatase: 64 U/L (ref 38–126)
Anion gap: 11 (ref 5–15)
BUN: 22 mg/dL (ref 8–23)
CO2: 22 mmol/L (ref 22–32)
Calcium: 9.1 mg/dL (ref 8.9–10.3)
Chloride: 105 mmol/L (ref 98–111)
Creatinine, Ser: 1.91 mg/dL — ABNORMAL HIGH (ref 0.61–1.24)
GFR calc Af Amer: 40 mL/min — ABNORMAL LOW (ref >60)
GFR calc non Af Amer: 35 mL/min — ABNORMAL LOW (ref >60)
Glucose, Bld: 91 mg/dL (ref 70–99)
Potassium: 5.1 mmol/L (ref 3.5–5.1)
Sodium: 138 mmol/L (ref 135–145)
Total Bilirubin: 1.5 mg/dL — ABNORMAL HIGH (ref 0.3–1.2)
Total Protein: 6.6 g/dL (ref 6.5–8.1)

## 2019-12-22 LAB — ACETAMINOPHEN LEVEL: Acetaminophen (Tylenol), Serum: <10 ug/mL — ABNORMAL LOW (ref 10–30)

## 2019-12-22 LAB — ETHANOL: Alcohol, Ethyl (B): <10 mg/dL (ref <10)

## 2019-12-22 LAB — SALICYLATE LEVEL: Salicylate Lvl: <7 mg/dL — ABNORMAL LOW (ref 7.0–30.0)

## 2019-12-22 LAB — CBG MONITORING, ED: Glucose-Capillary: 95 mg/dL (ref 70–99)

## 2019-12-22 LAB — SARS CORONAVIRUS 2 BY RT PCR (HOSPITAL ORDER, PERFORMED IN ~~LOC~~ HOSPITAL LAB): SARS Coronavirus 2: NEGATIVE

## 2019-12-22 MED ORDER — HEPARIN SODIUM (PORCINE) 5000 UNIT/ML IJ SOLN
5000.0000 [IU] | Freq: Three times a day (TID) | INTRAMUSCULAR | Status: DC
  Administered 2019-12-23: 5000 [IU] via SUBCUTANEOUS
  Filled 2019-12-22: qty 1

## 2019-12-22 MED ORDER — NALOXONE HCL 0.4 MG/ML IJ SOLN
0.4000 mg | Freq: Once | INTRAMUSCULAR | Status: AC
  Administered 2019-12-22: 0.4 mg via INTRAVENOUS
  Filled 2019-12-22: qty 1

## 2019-12-22 MED ORDER — SODIUM CHLORIDE 0.9 % IV SOLN: INTRAVENOUS | Status: DC

## 2019-12-22 MED ORDER — SODIUM CHLORIDE 0.9 % IV BOLUS
500.0000 mL | Freq: Once | INTRAVENOUS | Status: AC
  Administered 2019-12-22: 500 mL via INTRAVENOUS

## 2019-12-22 NOTE — ED Provider Notes (Addendum)
[facility] DEPT Provider Note   CSN: [id number] Arrival date & time: 12/22/19  2011     History Chief Complaint  Patient presents with  . Drug Overdose    George [name] is a 69 y.o. male.  HPI History is limited.  EMS history is that a friend was checking on the patient and found him poorly responsive and called EMS.  Reportedly, he had a prior episode of similar presentation with drug overdose/overuse.  EMS did not administer Narcan.  He was transported with stable airway.  The patient gives a somewhat rambling and disjointed history.  He admits mitts that he has some dependency for narcotic pill such as Percocet.  He described getting pain medications after his hip surgery and that he really likes them.  Now he can periodically get extra tablets from a source.  He reports initially he would take 1 extra maybe twice a day.  Then it got up to 2 at a time.  He describes this evening taking 3 oxycodone tablets.  Patient's history however is somewhat rambling and seems possibly unreliable.  He describes several people coming over and hanging out with him for a while.  Then he reports the mother is leaving and someone giving him some pills called "Jayes".  He reports he took them.  He says he does not know what they were.  He also then later reports that he injected heroin.  It is unclear to me if the patient is confabulating as he seems somewhat confused.    Past Medical History:  Diagnosis Date  . AAA (abdominal aortic aneurysm) ([place])    a. [place] 09/2015 - 3cm distal AAA. //  b. [place] 5/17: 3.1 cm   . Basal cell carcinoma of right side of nose   . Berger's disease   . CKD (chronic kidney disease) 09/2015  . COPD (chronic obstructive pulmonary disease) ([employer])   . GERD (gastroesophageal reflux disease)   . Nephrotic syndrome   . Panic attacks   . Sleep apnea   . SVT (supraventricular tachycardia) ([place]) 05/22/2015    Patient Active Problem List   Diagnosis Date  Noted  . H/O total hip arthroplasty, left 11/15/2019  . Status post total replacement of left hip 11/14/2019  . Preoperative clearance 10/19/2019  . Osteoarthritis of left hip 09/15/2019  . Vaccination declined by pt 08/25/2019  . Adjustment reaction with anxiety and depression 06/09/2019  . Reactive airway disease without complication [id number]  . Environmental and seasonal allergies 02/09/2019  . Abnormal skin growth- R inner nasal lacrimal fold 05/18/2018  . Excessive use of nonsteroidal anti-inflammatory drug (NSAID) 01/17/2018  . Chronic Right leg pain-  pt w/ history of ORIF femur 30-40 years ago increasing pain for 1 year, terrible pain 3 months 01/17/2018  . Insomnia due to anxiety and fear 09/15/2017  . Positive for macroalbuminuria 05/15/2017  . HLD (hyperlipidemia) 05/15/2017  . Low level of high density lipoprotein (HDL) 05/15/2017  . Hypertriglyceridemia 05/15/2017  . Obesity, Class I, BMI 30-34.9 04/29/2017  . Elevated blood pressure reading- situational 04/29/2017  . History of elevated glucose 04/29/2017  . Colonoscopy refused 04/29/2017  . Vitamin D deficiency 05/19/2016  . 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 Alcoholism /alcohol abuse- episodic binges 04/05/2016  . 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 Insomnia 04/05/2016  . Noncompliance with diet and medication regimen 04/05/2016  . Liver fibrosis 03/24/2016  . Signficant h/o tobacco abuse (150pk/yr hx) 03/03/2016  .  h/o Basal cell carcinoma of nose 03/03/2016  . GAD (generalized anxiety disorder) 03/03/2016  . h/o Hepatitis C infection 11/14/2015  . Berger's disease: IgA Nephropathy  10/12/2015  . COPD mixed type ([place]) 10/12/2015  . Aneurysm of infrarenal abdominal aorta ([place]) 09/24/2015  . h/o Hypothyroidism 09/22/2015  . Venous insufficiency of both lower extremities 09/21/2015  . H/O CHF 08/20/2015  . h/o SVT (supraventricular tachycardia) s/p Ablation 05/22/2015      Past Surgical History:  Procedure Laterality Date  . BASAL CELL CARCINOMA EXCISION Right ~ 06/2015   "side of my nose"  . ELECTROPHYSIOLOGIC STUDY N/A 09/10/2015   Procedure: SVT Ablation;  Surgeon: Evans Lance, MD;  Location: [facility];  Service: Cardiovascular;  Laterality: N/A;  . FEMUR FRACTURE SURGERY Right 1970   "had plate & pin put in"  . FEMUR HARDWARE REMOVAL Right 1971   "removed plate; left pin in  . FRACTURE SURGERY    . KNEE ARTHROSCOPY Right ~ 1985  . TONSILLECTOMY AND ADENOIDECTOMY  ~ 1955  . TOTAL HIP ARTHROPLASTY Left 11/14/2019   Procedure: LEFT TOTAL HIP ARTHROPLASTY ANTERIOR APPROACH;  Surgeon: Frederik Pear, MD;  Location: [facility];  Service: Orthopedics;  Laterality: Left;       Family History  Problem Relation Age of Onset  . Heart disease Mother        Before age 44  . Heart attack Mother   . Cancer Mother        lung and colon  . Stroke Mother   . Heart failure Father   . Heart disease Father        After age 73  . Heart attack Father   . Hyperlipidemia Father   . Healthy Maternal Grandmother   . Healthy Maternal Grandfather   . Diabetes Paternal Grandmother   . Healthy Paternal Grandfather   . Hypertension Neg Hx     Social History   Tobacco Use  . Smoking status: Former Smoker    Packs/day: 2.00    Years: 50.00    Pack years: 100.00    Types: Cigarettes    Quit date: 01/24/2015    Years since quitting: 4.9  . Smokeless tobacco: Never Used  Substance Use Topics  . Alcohol use: Not Currently    Alcohol/week: 3.0 - 4.0 standard drinks    Types: 3 - 4 Shots of liquor per week  . Drug use: No    Home Medications Prior to Admission medications   Medication Sig Start Date End Date Taking? Authorizing Provider  aspirin EC 81 MG tablet Take 1 tablet (81 mg total) by mouth 2 (two) times daily. 11/14/19   Leighton Parody, PA-C  Cholecalciferol (VITAMIN D3) 5000 units CAPS Take 5,000 Units by mouth daily.     [provider]  citalopram (CELEXA) 40 MG tablet TAKE 1 TABLET BY MOUTH DAILY. 12/09/19   Opalski, Deborah, DO  losartan (COZAAR) 50 MG tablet TAKE 1 TABLET (50 MG TOTAL) BY MOUTH DAILY. 07/25/19   Opalski, Deborah, DO  montelukast (SINGULAIR) 10 MG tablet TAKE 1 TABLET BY MOUTH AT BEDTIME. FOR BREATHING/ALLERGIES Patient taking differently: Take 10 mg by mouth at bedtime.  07/25/19   [name], DO  oxyCODONE-acetaminophen (PERCOCET/ROXICET) 5-325 MG tablet Take 1 tablet by mouth every 4 (four) hours as needed for severe pain. 11/14/19   Leighton Parody, PA-C  rosuvastatin (CRESTOR) 40 MG tablet Take 0.5 tablets (20 mg total) by mouth at bedtime. 08/30/19  Opalski, Deborah, DO  tiZANidine (ZANAFLEX) 2 MG tablet Take 1 tablet (2 mg total) by mouth every 6 (six) hours as needed. 11/14/19   Leighton Parody, PA-C  Vitamin D, Ergocalciferol, (DRISDOL) 1.25 MG (50000 UNIT) CAPS capsule Take one tablet wkly Patient taking differently: Take 50,000 Units by mouth every 7 (seven) days.  09/16/19   Opalski, Neoma Laming, DO  zolpidem (AMBIEN CR) 12.5 MG CR tablet TAKE 1 TABLET BY MOUTH AT BEDTIME AS NEEDED FOR SLEEP Patient taking differently: Take 12.5 mg by mouth at bedtime as needed (sleep.).  08/18/19   [name], DO    Allergies    Patient has no known allergies.  Review of Systems   Review of Systems level 5 caveat cannot obtain review of systems due to confusion. Physical Exam Updated Vital Signs BP 96/69   Pulse 100   Temp 97.8 F (36.6 C) (Oral)   Resp (!) 21   SpO2 95%   Physical Exam Constitutional:      Comments: Patient has slightly somnolent.  He seems mildly confused but is answering questions.  No respiratory distress.  HENT:     Head: Normocephalic and atraumatic.     Comments: Patient has evidence of old head trauma with healed scars and a skull defect on the left.    Mouth/Throat:     Mouth: Mucous membranes are dry.     Comments: Airway is patent.  Mucous membranes are very  dry. Eyes:     Extraocular Movements: Extraocular movements intact.     Comments: Pupils are about 3 mm and symmetric.  Extraocular motions intact.  Cardiovascular:     Rate and Rhythm: Normal rate and regular rhythm.  Pulmonary:     Effort: Pulmonary effort is normal.     Breath sounds: Normal breath sounds.  Abdominal:     General: There is no distension.     Palpations: Abdomen is soft.     Tenderness: There is no abdominal tenderness. There is no guarding.  Musculoskeletal:        General: No swelling or deformity. Normal range of motion.     Cervical back: Neck supple.     Comments: Patient has some very superficial abrasions to tops of his toes.  No deformities.  Patient can use all 4 extremities appropriately  Skin:    General: Skin is warm and dry.  Neurological:     Comments: Patient is slightly somnolent but is speaking in full sentences.  The content is slightly disjointed.  He does follow commands.  He is moving all 4 extremities without evident deficit.     ED Results / Procedures / Treatments   Labs (all labs ordered are listed, but only abnormal results are displayed) Labs Reviewed  COMPREHENSIVE METABOLIC PANEL - Abnormal; Notable for the following components:      Result Value   Creatinine, Ser 1.91 (*)    AST 45 (*)    Total Bilirubin 1.5 (*)    GFR calc non Af Amer 35 (*)    GFR calc Af Amer 40 (*)    All other components within normal limits  SALICYLATE LEVEL - Abnormal; Notable for the following components:   Salicylate Lvl [id number] (*)    All other components within normal limits  ACETAMINOPHEN LEVEL - Abnormal; Notable for the following components:   Acetaminophen (Tylenol), Serum <10 (*)    All other components within normal limits  CBC WITH DIFFERENTIAL/PLATELET - Abnormal; Notable for the following components:  WBC 12.3 (*)    RBC 3.77 (*)    Hemoglobin 11.5 (*)    HCT 36.0 (*)    Neutro Abs 10.4 (*)    Monocytes Absolute 1.1 (*)    Abs Immature  Granulocytes 0.08 (*)    All other components within normal limits  SARS CORONAVIRUS 2 BY RT PCR (HOSPITAL ORDER, [facility])  ETHANOL  RAPID URINE DRUG SCREEN, HOSP PERFORMED  URINALYSIS, ROUTINE W REFLEX MICROSCOPIC  CBG MONITORING, ED    EKG None  Radiology CT HEAD WO CONTRAST  Result Date: 12/22/2019 CLINICAL DATA:  Altered mental status, possible drug overdose EXAM: CT HEAD WITHOUT CONTRAST TECHNIQUE: Contiguous axial images were obtained from the base of the skull through the vertex without intravenous contrast. COMPARISON:  November 26, 2015 FINDINGS: Brain: No evidence of acute territorial infarction, hemorrhage, hydrocephalus,extra-axial collection or mass lesion/mass effect. There is dilatation the ventricles and sulci consistent with age-related atrophy. Low-attenuation changes in the deep white matter consistent with small vessel ischemia. Vascular: No hyperdense vessel or unexpected calcification. Skull: The skull is intact. No fracture or focal lesion identified. Sinuses/Orbits: The visualized paranasal sinuses and mastoid air cells are clear. The orbits and globes intact. Other: None IMPRESSION: No acute intracranial abnormality. Findings consistent with age related atrophy and chronic small vessel ischemia Electronically Signed   By: Prudencio Pair M.D.   On: 12/22/2019 21:18   DG Chest Port 1 View  Result Date: 12/22/2019 CLINICAL DATA:  Overdose EXAM: PORTABLE CHEST 1 VIEW COMPARISON:  11/10/2019 FINDINGS: The heart size and mediastinal contours are within normal limits. Both lungs are clear. The visualized skeletal structures are unremarkable. IMPRESSION: No active disease. Electronically Signed   By: Randa Ngo M.D.   On: 12/22/2019 21:04    Procedures Procedures (including critical care time) CRITICAL CARE Performed by: Charlesetta Shanks   Total critical care time: 45 minutes  Critical care time was exclusive of separately billable procedures  and treating other patients.  Critical care was necessary to treat or prevent imminent or life-threatening deterioration.  Critical care was time spent personally by me on the following activities: development of treatment plan with patient and/or surrogate as well as nursing, discussions with consultants, evaluation of patient's response to treatment, examination of patient, obtaining history from patient or surrogate, ordering and performing treatments and interventions, ordering and review of laboratory studies, ordering and review of radiographic studies, pulse oximetry and re-evaluation of patient's condition. Medications Ordered in ED Medications  0.9 %  sodium chloride infusion ( Intravenous New Bag/Given 12/22/19 2043)  sodium chloride 0.9 % bolus 500 mL (has no administration in time range)  naloxone [facility]) injection 0.4 mg (0.4 mg Intravenous Given 12/22/19 2040)    ED Course  I have reviewed the triage vital signs and the nursing notes.  Pertinent labs & imaging results that were available during my care of the patient were reviewed by me and considered in my medical decision making (see chart for details).    MDM Rules/Calculators/A&P                     Consult: [facility] hospitalist Dr. Josephine Cables for admission.  Patient presents confused but responsive.  Narcan did not significantly change his mental status.  Patient describes taking at least 3 oxycodone tablets.  It is unclear what he may have additionally taken.  There are no additional historian is here to give more specific information.  I did try to contact  his sister who is listed as the patient's only contact on the chart.  A message was left.  There was a Geneticist, molecular.  Patient's labs are fairly unremarkable at this time.  CT head obtained without acute injury.  Patient will require ongoing observation for older patient with possible polysubstance overdose.  At this time he does not endorse suicidal ideation but  will require further questioning her mental status is more clear. Final Clinical Impression(s) / ED Diagnoses Final diagnoses:  Opiate overdose, accidental or unintentional, initial encounter [facility])    Rx / [place] Orders ED Discharge Orders    None       Charlesetta Shanks, MD 12/22/19 2245    Charlesetta Shanks, MD 12/22/19 2311

## 2019-12-22 NOTE — ED Triage Notes (Signed)
Per EMS, patient states he took 3 oxycodone, but is a bit altered. Bystander said they found patient in a deep sleep on the couch in his home and says this has happened before about a month before, but with [place]. A&ox2. Patient has hx of hip replacement and says he was "just trying to take the edge off". Patient denies SI.

## 2019-12-22 NOTE — H&P (Signed)
History and Physical  MAARTEN BAISCH [id number] DOB: 07-Mar-1951 DOA: 12/22/2019  Referring physician: Charlesetta Shanks MD PCP: Lorrene Reid, PA-C  Patient coming from: Home  Chief Complaint: Altered mental status  HPI: George [name] is a 69 y.o. male with medical history significant for hypertension, hyperlipidemia, COPD, vitamin D deficiency, insomnia who presents to the emergency department via EMS due to mental status.  Patient was unable to provide history.  History was obtained from ED physician and ED medical record.  Per report, patient's friend went to check on him and noted that he was poorly responsive, so he activated EMS.  Patient was reported to have had prior episode of similar presentation with drug overdose/overuse.  No Narcan was administered by EMS.  Patient was reported to present with rambling and disjointed history.  He was reported to be on pain medication after having hip surgery and that he has obtained a source where he could obtain extra tablets periodically.  Patient was reported to admit to taking 3 oxycodone tablets this evening and to present with rambling, disjointed and unreliable history, 1 of which is receiving some pills known as "Jayes" from someone and then injecting heroin.  There was no reported fever, chills, headache, palpitation, nausea, vomiting or abdominal pain.  ED Course:  In the emergency department, he was hemodynamically stable.  Work-up in the ED showed normal CBC and BMP except for elevated creatinine at 1.91 (baseline creatinine appears to range within 1.0-1.26).  SARS coronavirus 2 by RT-PCR was negative.  CT of head without contrast showed no acute intracranial normality.  Chest x-ray showed no active disease.  Narcan 0.4 mg was given without any effect.  IV hydration was provided.  Hospitalist was asked to admit.  For further evaluation and management.  Review of Systems: This cannot be obtained at this time due to patient's current  condition.    Past Medical History:  Diagnosis Date  . AAA (abdominal aortic aneurysm) ([place])    a. [place] 09/2015 - 3cm distal AAA. //  b. [place] 5/17: 3.1 cm   . Basal cell carcinoma of right side of nose   . Berger's disease   . CKD (chronic kidney disease) 09/2015  . COPD (chronic obstructive pulmonary disease) ([place])   . GERD (gastroesophageal reflux disease)   . Nephrotic syndrome   . Panic attacks   . Sleep apnea   . SVT (supraventricular tachycardia) ([place]) 05/22/2015   Past Surgical History:  Procedure Laterality Date  . BASAL CELL CARCINOMA EXCISION Right ~ 06/2015   "side of my nose"  . ELECTROPHYSIOLOGIC STUDY N/A 09/10/2015   Procedure: SVT Ablation;  Surgeon: Evans Lance, MD;  Location: [facility];  Service: Cardiovascular;  Laterality: N/A;  . FEMUR FRACTURE SURGERY Right 1970   "had plate & pin put in"  . FEMUR HARDWARE REMOVAL Right 1971   "removed plate; left pin in  . FRACTURE SURGERY    . KNEE ARTHROSCOPY Right ~ 1985  . TONSILLECTOMY AND ADENOIDECTOMY  ~ 1955  . TOTAL HIP ARTHROPLASTY Left 11/14/2019   Procedure: LEFT TOTAL HIP ARTHROPLASTY ANTERIOR APPROACH;  Surgeon: Frederik Pear, MD;  Location: [facility];  Service: Orthopedics;  Laterality: Left;    Social History:  reports that he quit smoking about 4 years ago. His smoking use included cigarettes. He has a 100.00 pack-year smoking history. He has never used smokeless tobacco. He reports previous alcohol use of about 3.0 - 4.0 standard drinks of alcohol per week.  He reports that he does not use drugs.   No Known Allergies  Family History  Problem Relation Age of Onset  . Heart disease Mother        Before age 35  . Heart attack Mother   . Cancer Mother        lung and colon  . Stroke Mother   . Heart failure Father   . Heart disease Father        After age 30  . Heart attack Father   . Hyperlipidemia Father   . Healthy Maternal Grandmother   . Healthy Maternal Grandfather   . Diabetes Paternal  Grandmother   . Healthy Paternal Grandfather   . Hypertension Neg Hx      Prior to Admission medications   Medication Sig Start Date End Date Taking? Authorizing Provider  aspirin EC 81 MG tablet Take 1 tablet (81 mg total) by mouth 2 (two) times daily. 11/14/19   Leighton Parody, PA-C  Cholecalciferol (VITAMIN D3) 5000 units CAPS Take 5,000 Units by mouth daily.     [provider]  citalopram (CELEXA) 40 MG tablet TAKE 1 TABLET BY MOUTH DAILY. 12/09/19   Opalski, Deborah, DO  losartan (COZAAR) 50 MG tablet TAKE 1 TABLET (50 MG TOTAL) BY MOUTH DAILY. 07/25/19   Opalski, Deborah, DO  montelukast (SINGULAIR) 10 MG tablet TAKE 1 TABLET BY MOUTH AT BEDTIME. FOR BREATHING/ALLERGIES Patient taking differently: Take 10 mg by mouth at bedtime.  07/25/19   [name], DO  oxyCODONE-acetaminophen (PERCOCET/ROXICET) 5-325 MG tablet Take 1 tablet by mouth every 4 (four) hours as needed for severe pain. 11/14/19   Leighton Parody, PA-C  rosuvastatin (CRESTOR) 40 MG tablet Take 0.5 tablets (20 mg total) by mouth at bedtime. 08/30/19   Opalski, Neoma Laming, DO  tiZANidine (ZANAFLEX) 2 MG tablet Take 1 tablet (2 mg total) by mouth every 6 (six) hours as needed. 11/14/19   Leighton Parody, PA-C  Vitamin D, Ergocalciferol, (DRISDOL) 1.25 MG (50000 UNIT) CAPS capsule Take one tablet wkly Patient taking differently: Take 50,000 Units by mouth every 7 (seven) days.  09/16/19   Opalski, Neoma Laming, DO  zolpidem (AMBIEN CR) 12.5 MG CR tablet TAKE 1 TABLET BY MOUTH AT BEDTIME AS NEEDED FOR SLEEP Patient taking differently: Take 12.5 mg by mouth at bedtime as needed (sleep.).  08/18/19   [name], DO    Physical Exam: BP 118/67   Pulse 92   Temp 97.8 F (36.6 C) (Oral)   Resp (!) 21   SpO2 97%   . General: 69 y.o. year-old male well developed well nourished in no acute distress.  Alert and oriented x1. Marland Kitchen HEENT: Dry mucous membrane.  Normocephalic, atraumatic. PERRLA . Neck: Supple, trachea  medial . Cardiovascular: Regular rate and rhythm with no rubs or gallops.  No thyromegaly or JVD noted.  No lower extremity edema. 2/4 pulses in all 4 extremities. Marland Kitchen Respiratory: Clear to auscultation with no wheezes or rales. Good inspiratory effort. . Abdomen: Soft nontender nondistended with normal bowel sounds x4 quadrants. . Muskuloskeletal: No cyanosis, clubbing or edema noted bilaterally . Neuro: Patient follows commands and moves all 4 extremities.  Was only alert and oriented to person, but not to place and time.  Strength and sensation intact . Skin: No ulcerative lesions noted or rashes . Psychiatry: Judgement and insight appear impaired.          Labs on Admission:  Basic Metabolic Panel: Recent Labs  Lab 12/22/19 2045  NA 138  K 5.1  CL 105  CO2 22  GLUCOSE 91  BUN 22  CREATININE 1.91*  CALCIUM 9.1   Liver Function Tests: Recent Labs  Lab 12/22/19 2045  AST 45*  ALT 10  ALKPHOS 64  BILITOT 1.5*  PROT 6.6  ALBUMIN 4.0   No results for input(s): LIPASE, AMYLASE in the last 168 hours. No results for input(s): AMMONIA in the last 168 hours. CBC: Recent Labs  Lab 12/22/19 2045  WBC 12.3*  NEUTROABS 10.4*  HGB 11.5*  HCT 36.0*  MCV 95.5  PLT 263   Cardiac Enzymes: No results for input(s): CKTOTAL, CKMB, CKMBINDEX, TROPONINI in the last 168 hours.  BNP (last 3 results) No results for input(s): BNP in the last 8760 hours.  ProBNP (last 3 results) No results for input(s): PROBNP in the last 8760 hours.  CBG: Recent Labs  Lab 12/22/19 2041  GLUCAP 95    Radiological Exams on Admission: CT HEAD WO CONTRAST  Result Date: 12/22/2019 CLINICAL DATA:  Altered mental status, possible drug overdose EXAM: CT HEAD WITHOUT CONTRAST TECHNIQUE: Contiguous axial images were obtained from the base of the skull through the vertex without intravenous contrast. COMPARISON:  November 26, 2015 FINDINGS: Brain: No evidence of acute territorial infarction, hemorrhage,  hydrocephalus,extra-axial collection or mass lesion/mass effect. There is dilatation the ventricles and sulci consistent with age-related atrophy. Low-attenuation changes in the deep white matter consistent with small vessel ischemia. Vascular: No hyperdense vessel or unexpected calcification. Skull: The skull is intact. No fracture or focal lesion identified. Sinuses/Orbits: The visualized paranasal sinuses and mastoid air cells are clear. The orbits and globes intact. Other: None IMPRESSION: No acute intracranial abnormality. Findings consistent with age related atrophy and chronic small vessel ischemia Electronically Signed   By: Prudencio Pair M.D.   On: 12/22/2019 21:18   DG Chest Port 1 View  Result Date: 12/22/2019 CLINICAL DATA:  Overdose EXAM: PORTABLE CHEST 1 VIEW COMPARISON:  11/10/2019 FINDINGS: The heart size and mediastinal contours are within normal limits. Both lungs are clear. The visualized skeletal structures are unremarkable. IMPRESSION: No active disease. Electronically Signed   By: Randa Ngo M.D.   On: 12/22/2019 21:04    EKG: I independently viewed the EKG done and my findings are as followed: No EKG was done in the ED  Assessment/Plan Present on Admission: . Altered mental status . COPD mixed type ([place]) . HLD (hyperlipidemia)  Active Problems:   COPD mixed type ([facility])   HLD (hyperlipidemia)   H/O total hip arthroplasty, left   Altered mental status   Essential hypertension  Altered mental status possibly secondary to multifactorial Patient was reported to have taken some medication including oxycodone prior to arrival at the ED.  Salicylate level and acetaminophen level as well as alcohol level were within normal range He was noted to be on opioids at home possibly due to his history of left total hip arthroplasty All CNS drugs will be held at this time Continue fall precaution, seizure precaution, aspiration precaution and neuro checks  Dehydration Continue IV  hydration  Acute kidney injury on CKD stage III Creatinine on admission=  1.91 (baseline creatinine appears to range within 1.0-1.26).  Renally adjust medications, avoid nephrotoxic agents/dehydration/hypotension   Hypertension Continue losartan  Hyperlipidemia Continue Crestor  COPD Continue Singulair  History of left total hip arthroplasty Home Percocet will be held at this time  DVT prophylaxis: Subcu heparin  Code Status: Full  Family Communication: None at bedside  Disposition Plan: To discharge patient home within next 24 hours pending improved mental status  Consults called: None  Admission status: Observation    Bernadette Hoit MD [facility]   If 7PM-7AM, please contact night-coverage [email or web address]  12/23/2019, 12:41 AM

## 2019-12-22 NOTE — ED Notes (Signed)
Patient transported to ct

## 2019-12-23 DIAGNOSIS — T40601A Poisoning by unspecified narcotics, accidental (unintentional), initial encounter: Secondary | ICD-10-CM

## 2019-12-23 DIAGNOSIS — J449 Chronic obstructive pulmonary disease, unspecified: Secondary | ICD-10-CM | POA: Diagnosis not present

## 2019-12-23 DIAGNOSIS — I10 Essential (primary) hypertension: Secondary | ICD-10-CM | POA: Diagnosis not present

## 2019-12-23 DIAGNOSIS — E785 Hyperlipidemia, unspecified: Secondary | ICD-10-CM | POA: Diagnosis not present

## 2019-12-23 DIAGNOSIS — R400 Somnolence: Secondary | ICD-10-CM | POA: Diagnosis not present

## 2019-12-23 LAB — BLOOD GAS, ARTERIAL
Acid-base deficit: 3.4 mmol/L — ABNORMAL HIGH (ref 0.0–2.0)
Bicarbonate: 19.8 mmol/L — ABNORMAL LOW (ref 20.0–28.0)
Drawn by: 422461
FIO2: 21
O2 Saturation: 95.8 %
Patient temperature: 97.8
pCO2 arterial: 30.7 mmHg — ABNORMAL LOW (ref 32.0–48.0)
pH, Arterial: 7.422 (ref 7.350–7.450)
pO2, Arterial: 82.6 mmHg — ABNORMAL LOW (ref 83.0–108.0)

## 2019-12-23 LAB — URINALYSIS, ROUTINE W REFLEX MICROSCOPIC
Bacteria, UA: NONE SEEN
Bilirubin Urine: NEGATIVE
Glucose, UA: NEGATIVE mg/dL
Ketones, ur: NEGATIVE mg/dL
Leukocytes,Ua: NEGATIVE
Nitrite: NEGATIVE
Protein, ur: 30 mg/dL — AB
Specific Gravity, Urine: 1.015 (ref 1.005–1.030)
pH: 5 (ref 5.0–8.0)

## 2019-12-23 LAB — RAPID URINE DRUG SCREEN, HOSP PERFORMED
Amphetamines: NOT DETECTED
Barbiturates: NOT DETECTED
Benzodiazepines: NOT DETECTED
Cocaine: NOT DETECTED
Opiates: NOT DETECTED
Tetrahydrocannabinol: NOT DETECTED

## 2019-12-23 LAB — CBC
HCT: 36.6 % — ABNORMAL LOW (ref 39.0–52.0)
Hemoglobin: 11.7 g/dL — ABNORMAL LOW (ref 13.0–17.0)
MCH: 30.4 pg (ref 26.0–34.0)
MCHC: 32 g/dL (ref 30.0–36.0)
MCV: 95.1 fL (ref 80.0–100.0)
Platelets: 236 10*3/uL (ref 150–400)
RBC: 3.85 MIL/uL — ABNORMAL LOW (ref 4.22–5.81)
RDW: 15.1 % (ref 11.5–15.5)
WBC: 6.4 10*3/uL (ref 4.0–10.5)
nRBC: 0 % (ref 0.0–0.2)

## 2019-12-23 LAB — COMPREHENSIVE METABOLIC PANEL
ALT: 15 U/L (ref 0–44)
AST: 78 U/L — ABNORMAL HIGH (ref 15–41)
Albumin: 4 g/dL (ref 3.5–5.0)
Alkaline Phosphatase: 67 U/L (ref 38–126)
Anion gap: 9 (ref 5–15)
BUN: 23 mg/dL (ref 8–23)
CO2: 22 mmol/L (ref 22–32)
Calcium: 9.1 mg/dL (ref 8.9–10.3)
Chloride: 104 mmol/L (ref 98–111)
Creatinine, Ser: 1.75 mg/dL — ABNORMAL HIGH (ref 0.61–1.24)
GFR calc Af Amer: 45 mL/min — ABNORMAL LOW (ref >60)
GFR calc non Af Amer: 39 mL/min — ABNORMAL LOW (ref >60)
Glucose, Bld: 131 mg/dL — ABNORMAL HIGH (ref 70–99)
Potassium: 4.4 mmol/L (ref 3.5–5.1)
Sodium: 135 mmol/L (ref 135–145)
Total Bilirubin: 0.8 mg/dL (ref 0.3–1.2)
Total Protein: 6.7 g/dL (ref 6.5–8.1)

## 2019-12-23 LAB — PROTIME-INR
INR: 1.2 (ref 0.8–1.2)
Prothrombin Time: 14.2 seconds (ref 11.4–15.2)

## 2019-12-23 LAB — HIV ANTIBODY (ROUTINE TESTING W REFLEX): HIV Screen 4th Generation wRfx: NONREACTIVE

## 2019-12-23 LAB — APTT: aPTT: 22 seconds — ABNORMAL LOW (ref 24–36)

## 2019-12-23 NOTE — ED Notes (Signed)
Spoke with George [name] who mentioned that she can come pick her brother up between 33 and 1 pm.

## 2019-12-23 NOTE — Discharge Summary (Signed)
Physician Discharge Summary  YAKIR MOFFIT [id number] DOB: [birth year] DOA: 12/22/2019  PCP: Lorrene Reid, PA-C  Admit date: 12/22/2019 Discharge date: 12/23/2019  Admitted From: home Disposition:  home  Recommendations for Outpatient Follow-up:  1. Follow up with PCP in 1-2 weeks 2. Patient was recommended to stop using narcotics and Ambien  Home Health: None Equipment/Devices: None  Discharge Condition: Stable CODE STATUS: Full code Diet recommendation: Regular diet  HPI: Per admitting MD, NAME SEESE is a 69 y.o. male with medical history significant for hypertension, hyperlipidemia, COPD, vitamin D deficiency, insomnia who presents to the emergency department via EMS due to mental status.  Patient was unable to provide history.  History was obtained from ED physician and ED medical record.  Per report, patient's friend went to check on him and noted that he was poorly responsive, so he activated EMS.  Patient was reported to have had prior episode of similar presentation with drug overdose/overuse.  No Narcan was administered by EMS.  Patient was reported to present with rambling and disjointed history.  He was reported to be on pain medication after having hip surgery and that he has obtained a source where he could obtain extra tablets periodically.  Patient was reported to admit to taking 3 oxycodone tablets this evening and to present with rambling, disjointed and unreliable history, 1 of which is receiving some pills known as "Jayes" from someone and then injecting heroin.  There was no reported fever, chills, headache, palpitation, nausea, vomiting or abdominal pain. ED Course: In the emergency department, he was hemodynamically stable.  Work-up in the ED showed normal CBC and BMP except for elevated creatinine at 1.91 (baseline creatinine appears to range within 1.0-1.26).  SARS coronavirus 2 by RT-PCR was negative.  CT of head without contrast showed no acute  intracranial normality.  Chest x-ray showed no active disease.  Narcan 0.4 mg was given without any effect.  IV hydration was provided.  Hospitalist was asked to admit.  For further evaluation and management.  Hospital Course / Discharge diagnoses: Acute metabolic encephalopathy due to medication overdose-patient admitted to the hospital with confusion, lethargy.  He was observed overnight, his lethargy resolved, he returned to baseline and is now alert and oriented x4.  He tells me that he likes to "get high" at times, and took 3 oxycodone as well as 2 Ambien last night and the next thing he was here.  I advised him to avoid this medication, however did not seem to be particularly engaged with this idea.  Recommend outpatient follow-up with PCP for tapering/alternatives/perhaps referral to pain management.  Of note, denies depression, denies suicidal ideation, denies intent for self-harm Dehydration/acute kidney injury on chronic kidney disease stage III-likely dehydrated on admission, creatinine improved with fluids, recommend repeat as an outpatient within a week. Essential hypertension-continue medications Hyperlipidemia-continue medications History of COPD-on room air, no wheezing, respiratory status at baseline. History of left total hip arthroplasty-hold narcotics, Pmay need outpatient referral to pain management  Discharge Instructions   Allergies as of 12/23/2019   No Known Allergies     Medication List    STOP taking these medications   oxyCODONE-acetaminophen 5-325 MG tablet Commonly known as: PERCOCET/ROXICET   zolpidem 12.5 MG CR tablet Commonly known as: AMBIEN CR     TAKE these medications   aspirin EC 81 MG tablet Take 1 tablet (81 mg total) by mouth 2 (two) times daily.   citalopram 40 MG tablet Commonly known as: CELEXA TAKE  1 TABLET BY MOUTH DAILY.   losartan 50 MG tablet Commonly known as: COZAAR TAKE 1 TABLET (50 MG TOTAL) BY MOUTH DAILY.   montelukast 10 MG  tablet Commonly known as: SINGULAIR TAKE 1 TABLET BY MOUTH AT BEDTIME. FOR BREATHING/ALLERGIES What changed: See the new instructions.   rosuvastatin 40 MG tablet Commonly known as: Crestor Take 0.5 tablets (20 mg total) by mouth at bedtime.   tiZANidine 2 MG tablet Commonly known as: ZANAFLEX Take 1 tablet (2 mg total) by mouth every 6 (six) hours as needed.   Vitamin D (Ergocalciferol) 1.25 MG (50000 UNIT) Caps capsule Commonly known as: DRISDOL Take one tablet wkly What changed:   how much to take  how to take this  when to take this  additional instructions      Follow-up Information    Lorrene Reid, PA-C. Schedule an appointment as soon as possible for a visit in 1 week(s).   Specialty: Physician Assistant Contact information: [place] [id number] [phone]           Consultations:  None   Procedures/Studies:  CT HEAD WO CONTRAST  Result Date: 12/22/2019 CLINICAL DATA:  Altered mental status, possible drug overdose EXAM: CT HEAD WITHOUT CONTRAST TECHNIQUE: Contiguous axial images were obtained from the base of the skull through the vertex without intravenous contrast. COMPARISON:  November 26, 2015 FINDINGS: Brain: No evidence of acute territorial infarction, hemorrhage, hydrocephalus,extra-axial collection or mass lesion/mass effect. There is dilatation the ventricles and sulci consistent with age-related atrophy. Low-attenuation changes in the deep white matter consistent with small vessel ischemia. Vascular: No hyperdense vessel or unexpected calcification. Skull: The skull is intact. No fracture or focal lesion identified. Sinuses/Orbits: The visualized paranasal sinuses and mastoid air cells are clear. The orbits and globes intact. Other: None IMPRESSION: No acute intracranial abnormality. Findings consistent with age related atrophy and chronic small vessel ischemia Electronically Signed   By: Prudencio Pair M.D.   On:  12/22/2019 21:18   DG Chest Port 1 View  Result Date: 12/22/2019 CLINICAL DATA:  Overdose EXAM: PORTABLE CHEST 1 VIEW COMPARISON:  11/10/2019 FINDINGS: The heart size and mediastinal contours are within normal limits. Both lungs are clear. The visualized skeletal structures are unremarkable. IMPRESSION: No active disease. Electronically Signed   By: Randa Ngo M.D.   On: 12/22/2019 21:04     Subjective: - no chest pain, shortness of breath, no abdominal pain, nausea or vomiting.   Discharge Exam: BP (!) 160/131   Pulse (!) 122   Temp 97.8 F (36.6 C) (Oral)   Resp 16   SpO2 97%   General: Pt is alert, awake, not in acute distress Cardiovascular: RRR, S1/S2 +, no rubs, no gallops Respiratory: CTA bilaterally, no wheezing, no rhonchi Abdominal: Soft, NT, ND, bowel sounds + Extremities: no edema, no cyanosis   The results of significant diagnostics from this hospitalization (including imaging, microbiology, ancillary and laboratory) are listed below for reference.     Microbiology: Recent Results (from the past 240 hour(s))  SARS Coronavirus 2 by RT PCR (hospital order, performed in [facility] hospital lab) Nasopharyngeal Nasopharyngeal Swab     Status: None   Collection Time: 12/22/19 10:45 PM   Specimen: Nasopharyngeal Swab  Result Value Ref Range Status   SARS Coronavirus 2 NEGATIVE NEGATIVE Final    Comment: (NOTE) SARS-CoV-2 target nucleic acids are NOT DETECTED. The SARS-CoV-2 RNA is generally detectable in upper and lower respiratory specimens during the acute  phase of infection. The lowest concentration of SARS-CoV-2 viral copies this assay can detect is 250 copies / mL. A negative result does not preclude SARS-CoV-2 infection and should not be used as the sole basis for treatment or other patient management decisions.  A negative result may occur with improper specimen collection / handling, submission of specimen other than nasopharyngeal swab, presence of  viral mutation(s) within the areas targeted by this assay, and inadequate number of viral copies (<250 copies / mL). A negative result must be combined with clinical observations, patient history, and epidemiological information. Fact Sheet for Patients:   StrictlyIdeas.no Fact Sheet for Healthcare Providers: BankingDealers.co.za This test is not yet approved or cleared  by the [place] FDA and has been authorized for detection and/or diagnosis of SARS-CoV-2 by FDA under an Emergency Use Authorization (EUA).  This EUA will remain in effect (meaning this test can be used) for the duration of the COVID-19 declaration under Section 564(b)(1) of the Act, 21 U.S.C. section 360bbb-3(b)(1), unless the authorization is terminated or revoked sooner. Performed at [facility], [place] [address], [place], [place] [address]      Labs: Basic Metabolic Panel: Recent Labs  Lab 12/22/19 2045 12/23/19 0438  NA 138 135  K 5.1 4.4  CL 105 104  CO2 22 22  GLUCOSE 91 131*  BUN 22 23  CREATININE 1.91* 1.75*  CALCIUM 9.1 9.1   Liver Function Tests: Recent Labs  Lab 12/22/19 2045 12/23/19 0438  AST 45* 78*  ALT 10 15  ALKPHOS 64 67  BILITOT 1.5* 0.8  PROT 6.6 6.7  ALBUMIN 4.0 4.0   CBC: Recent Labs  Lab 12/22/19 2045 12/23/19 0438  WBC 12.3* 6.4  NEUTROABS 10.4*  --   HGB 11.5* 11.7*  HCT 36.0* 36.6*  MCV 95.5 95.1  PLT 263 236   CBG: Recent Labs  Lab 12/22/19 2041  GLUCAP 95   Hgb A1c No results for input(s): HGBA1C in the last 72 hours. Lipid Profile No results for input(s): CHOL, HDL, LDLCALC, TRIG, CHOLHDL, LDLDIRECT in the last 72 hours. Thyroid function studies No results for input(s): TSH, T4TOTAL, T3FREE, THYROIDAB in the last 72 hours.  Invalid input(s): FREET3 Urinalysis    Component Value Date/Time   COLORURINE YELLOW 12/23/2019 [place] 12/23/2019 0135   LABSPEC 1.015  12/23/2019 0135   PHURINE 5.0 12/23/2019 0135   GLUCOSEU NEGATIVE 12/23/2019 0135   HGBUR LARGE (A) 12/23/2019 0135   BILIRUBINUR NEGATIVE 12/23/2019 0135   KETONESUR NEGATIVE 12/23/2019 0135   PROTEINUR 30 (A) 12/23/2019 0135   UROBILINOGEN 1.0 05/22/2015 1252   NITRITE NEGATIVE 12/23/2019 0135   LEUKOCYTESUR NEGATIVE 12/23/2019 0135    FURTHER DISCHARGE INSTRUCTIONS:   Get Medicines reviewed and adjusted: Please take all your medications with you for your next visit with your Primary MD   Laboratory/radiological data: Please request your Primary MD to go over all hospital tests and procedure/radiological results at the follow up, please ask your Primary MD to get all Hospital records sent to his/her office.   In some cases, they will be blood work, cultures and biopsy results pending at the time of your discharge. Please request that your primary care M.D. goes through all the records of your hospital data and follows up on these results.   Also Note the following: If you experience worsening of your admission symptoms, develop shortness of breath, life threatening emergency, suicidal or homicidal thoughts you must seek medical attention immediately  by calling 911 or calling your MD immediately  if symptoms less severe.   You must read complete instructions/literature along with all the possible adverse reactions/side effects for all the Medicines you take and that have been prescribed to you. Take any new Medicines after you have completely understood and accpet all the possible adverse reactions/side effects.    Do not drive when taking Pain medications or sleeping medications (Benzodaizepines)   Do not take more than prescribed Pain, Sleep and Anxiety Medications. It is not advisable to combine anxiety,sleep and pain medications without talking with your primary care practitioner   Special Instructions: If you have smoked or chewed Tobacco  in the last 2 yrs please stop smoking,  stop any regular Alcohol  and or any Recreational drug use.   Wear Seat belts while driving.   Please note: You were cared for by a hospitalist during your hospital stay. Once you are discharged, your primary care physician will handle any further medical issues. Please note that NO REFILLS for any discharge medications will be authorized once you are discharged, as it is imperative that you return to your primary care physician (or establish a relationship with a primary care physician if you do not have one) for your post hospital discharge needs so that they can reassess your need for medications and monitor your lab values.  Time coordinating discharge: 35 minutes  SIGNED:  Marzetta Board, MD, PhD 12/23/2019, 10:24 AM

## 2019-12-23 NOTE — ED Notes (Signed)
PT at bedside with patient

## 2019-12-23 NOTE — Discharge Instructions (Signed)
Follow with Lorrene Reid, PA-C in 5-7 days  Please get a complete blood count and chemistry panel checked by your Primary MD at your next visit, and again as instructed by your Primary MD. Please get your medications reviewed and adjusted by your Primary MD.  Please request your Primary MD to go over all Hospital Tests and Procedure/Radiological results at the follow up, please get all Hospital records sent to your Prim MD by signing hospital release before you go home.  In some cases, there will be blood work, cultures and biopsy results pending at the time of your discharge. Please request that your primary care M.D. goes through all the records of your hospital data and follows up on these results.  If you had Pneumonia of Lung problems at the Hospital: Please get a 2 view Chest X ray done in 6-8 weeks after hospital discharge or sooner if instructed by your Primary MD.  If you have Congestive Heart Failure: Please call your Cardiologist or Primary MD anytime you have any of the following symptoms:  1) 3 pound weight gain in 24 hours or 5 pounds in 1 week  2) shortness of breath, with or without a dry hacking cough  3) swelling in the hands, feet or stomach  4) if you have to sleep on extra pillows at night in order to breathe  Follow cardiac low salt diet and 1.5 lit/day fluid restriction.  If you have diabetes Accuchecks 4 times/day, Once in AM empty stomach and then before each meal. Log in all results and show them to your primary doctor at your next visit. If any glucose reading is under 80 or above 300 call your primary MD immediately.  If you have Seizure/Convulsions/Epilepsy: Please do not drive, operate heavy machinery, participate in activities at heights or participate in high speed sports until you have seen by Primary MD or a Neurologist and advised to do so again. Per [facility] statutes, patients with seizures are not allowed to drive until they have been  seizure-free for six months.  Use caution when using heavy equipment or power tools. Avoid working on ladders or at heights. Take showers instead of baths. Ensure the water temperature is not too high on the home water heater. Do not go swimming alone. Do not lock yourself in a room alone (i.e. bathroom). When caring for infants or small children, sit down when holding, feeding, or changing them to minimize risk of injury to the child in the event you have a seizure. Maintain good sleep hygiene. Avoid alcohol.   If you had Gastrointestinal Bleeding: Please ask your Primary MD to check a complete blood count within one week of discharge or at your next visit. Your endoscopic/colonoscopic biopsies that are pending at the time of discharge, will also need to followed by your Primary MD.  Get Medicines reviewed and adjusted. Please take all your medications with you for your next visit with your Primary MD  Please request your Primary MD to go over all hospital tests and procedure/radiological results at the follow up, please ask your Primary MD to get all Hospital records sent to his/her office.  If you experience worsening of your admission symptoms, develop shortness of breath, life threatening emergency, suicidal or homicidal thoughts you must seek medical attention immediately by calling 911 or calling your MD immediately  if symptoms less severe.  You must read complete instructions/literature along with all the possible adverse reactions/side effects for all the Medicines you take  and that have been prescribed to you. Take any new Medicines after you have completely understood and accpet all the possible adverse reactions/side effects.   Do not drive or operate heavy machinery when taking Pain medications.   Do not take more than prescribed Pain, Sleep and Anxiety Medications  Special Instructions: If you have smoked or chewed Tobacco  in the last 2 yrs please stop smoking, stop any regular  Alcohol  and or any Recreational drug use.  Wear Seat belts while driving.  Please note You were cared for by a hospitalist during your hospital stay. If you have any questions about your discharge medications or the care you received while you were in the hospital after you are discharged, you can call the unit and asked to speak with the hospitalist on call if the hospitalist that took care of you is not available. Once you are discharged, your primary care physician will handle any further medical issues. Please note that NO REFILLS for any discharge medications will be authorized once you are discharged, as it is imperative that you return to your primary care physician (or establish a relationship with a primary care physician if you do not have one) for your aftercare needs so that they can reassess your need for medications and monitor your lab values.  You can reach the hospitalist office at phone [phone] or fax [phone]   If you do not have a primary care physician, you can call [phone] for a physician referral.  Activity: As tolerated with Full fall precautions use walker/cane & assistance as needed    Diet: regular  Disposition Home

## 2019-12-23 NOTE — Evaluation (Signed)
Physical Therapy Evaluation Patient Details Name: George [name] MRN: [id number] DOB: 1950/11/26 Today's Date: 12/23/2019   History of Present Illness  Pt admitted through ED with apparent OD and with hx of COPD, CKD, AAA, and L thr 11/15/19  Clinical Impression  Pt admitted as above and presenting with functional mobility limitations 2* generalized weakness and mild ambulatory balance deficits.  Pt currently mobilizing at sup level with SPC and could benefit from higher level balance work but states he is not interested in pursuing this "I don't leave the house and I think I do ok there".  Pt eager for dc home with assist of siblings PRN.    Follow Up Recommendations No PT follow up    Equipment Recommendations  None recommended by PT    Recommendations for Other Services       Precautions / Restrictions Precautions Precautions: Fall Restrictions Weight Bearing Restrictions: No      Mobility  Bed Mobility Overal bed mobility: Modified Independent             General bed mobility comments: pt unassisted supine<>sit  Transfers Overall transfer level: Modified independent Equipment used: Straight cane             General transfer comment: Pt unassisted sit<>stand  Ambulation/Gait Ambulation/Gait assistance: Min guard;Supervision Gait Distance (Feet): 200 Feet Assistive device: Straight cane Gait Pattern/deviations: Step-through pattern;Decreased step length - right;Decreased step length - left;Shuffle;Antalgic;Trunk flexed Gait velocity: decr   General Gait Details: decreased pace with cues to place [facility] further to side; Limited by SOB 2* COPD  Stairs            Wheelchair Mobility    Modified Rankin (Stroke Patients Only)       Balance Overall balance assessment: Needs assistance Sitting-balance support: No upper extremity supported;Feet supported Sitting balance-Leahy Scale: Good     Standing balance support: Single extremity  supported Standing balance-Leahy Scale: Good                               Pertinent Vitals/Pain Pain Assessment: 0-10 Pain Score: 3  Pain Location: L hip Pain Descriptors / Indicators: Aching Pain Intervention(s): Limited activity within patient's tolerance;Monitored during session    Home Living Family/patient expects to be discharged to:: Private residence Living Arrangements: Alone Available Help at Discharge: Family;Available PRN/intermittently Type of Home: House Home Access: Stairs to enter Entrance Stairs-Rails: Right;Left;Can reach both Entrance Stairs-Number of Steps: 4 Home Layout: One level Home Equipment: Walker - 2 wheels;Cane - single point Additional Comments: Pt states siblings can assist as much as needed    Prior Function Level of Independence: Independent with assistive device(s)         Comments: amb with cane      Hand Dominance   Dominant Hand: Right    Extremity/Trunk Assessment   Upper Extremity Assessment Upper Extremity Assessment: RUE deficits/detail;LUE deficits/detail;Generalized weakness RUE Deficits / Details: Tremor R>L    Lower Extremity Assessment Lower Extremity Assessment: Generalized weakness       Communication   Communication: No difficulties  Cognition Arousal/Alertness: Awake/alert Behavior During Therapy: WFL for tasks assessed/performed Overall Cognitive Status: Within Functional Limits for tasks assessed                                        General Comments  Exercises     Assessment/Plan    PT Assessment Patient needs continued PT services  PT Problem List Decreased strength;Decreased activity tolerance;Decreased balance;Pain;Decreased knowledge of use of DME;Decreased mobility       PT Treatment Interventions DME instruction;Gait training;Stair training;Functional mobility training;Therapeutic activities;Therapeutic exercise;Balance training;Patient/family education     PT Goals (Current goals can be found in the Care Plan section)  Acute Rehab PT Goals Patient Stated Goal: HOME PT Goal Formulation: With patient Time For Goal Achievement: 12/30/19 Potential to Achieve Goals: Good    Frequency Min 3X/week   Barriers to discharge        Co-evaluation               AM-PAC PT "6 Clicks" Mobility  Outcome Measure Help needed turning from your back to your side while in a flat bed without using bedrails?: None Help needed moving from lying on your back to sitting on the side of a flat bed without using bedrails?: None Help needed moving to and from a bed to a chair (including a wheelchair)?: None Help needed standing up from a chair using your arms (e.g., wheelchair or bedside chair)?: None Help needed to walk in hospital room?: A Little Help needed climbing 3-5 steps with a railing? : A Little 6 Click Score: 22    End of Session Equipment Utilized During Treatment: Gait belt Activity Tolerance: Patient tolerated treatment well;Patient limited by fatigue Patient left: in bed;with call bell/phone within reach Nurse Communication: Mobility status PT Visit Diagnosis: Difficulty in walking, not elsewhere classified (R26.2)    Time: [id number] PT Time Calculation (min) (ACUTE ONLY): 16 min   Charges:   PT Evaluation $PT Eval Low Complexity: 1 Low          Francis Pager [phone] Office [phone]   Terez Freimark 12/23/2019, 10:10 AM

## 2020-01-31 ENCOUNTER — Telehealth: Payer: Self-pay | Admitting: Physician Assistant

## 2020-01-31 NOTE — Telephone Encounter (Signed)
Please advise. AS, CMA 

## 2020-01-31 NOTE — Telephone Encounter (Signed)
Patient came by the office to drop off jury summons, he is requesting a drafted letter from PCP to excuse him from jury duty. Patient has history of COPD and worries about having to be masked for an extended period of time. Please draft letter if appropriate.

## 2020-02-02 ENCOUNTER — Other Ambulatory Visit: Payer: Self-pay | Admitting: Family Medicine

## 2020-02-02 DIAGNOSIS — J45909 Unspecified asthma, uncomplicated: Secondary | ICD-10-CM

## 2020-02-02 DIAGNOSIS — J3089 Other allergic rhinitis: Secondary | ICD-10-CM

## 2020-02-02 DIAGNOSIS — J449 Chronic obstructive pulmonary disease, unspecified: Secondary | ICD-10-CM

## 2020-02-02 NOTE — Telephone Encounter (Signed)
Spoke with Herb Grays who states we are unable to provide a letter to dismiss patient from jury duty due to mask. There is proven data that shows masks do not decrease the amount of oxygen a person intakes and should not hinder his ability to perform jury duty.    Left message for patient to call back. Placing summons letter at front desk under patient last name. AS, CMA

## 2020-02-06 NOTE — Telephone Encounter (Signed)
Left message for patient to call back in regards to below. AS, CMA °

## 2020-02-06 NOTE — Telephone Encounter (Signed)
Left message for patient to call bac

## 2020-02-06 NOTE — Telephone Encounter (Signed)
Patient is aware of the below and verbalized understanding. AS, CMA 

## 2020-02-14 ENCOUNTER — Other Ambulatory Visit: Payer: Self-pay | Admitting: Family Medicine

## 2020-02-14 DIAGNOSIS — F4323 Adjustment disorder with mixed anxiety and depressed mood: Secondary | ICD-10-CM

## 2020-02-14 DIAGNOSIS — F411 Generalized anxiety disorder: Secondary | ICD-10-CM

## 2020-02-17 ENCOUNTER — Encounter: Payer: Self-pay | Admitting: Physician Assistant

## 2020-02-17 ENCOUNTER — Ambulatory Visit (INDEPENDENT_AMBULATORY_CARE_PROVIDER_SITE_OTHER): Payer: Medicare Other | Admitting: Physician Assistant

## 2020-02-17 ENCOUNTER — Other Ambulatory Visit: Payer: Self-pay

## 2020-02-17 VITALS — BP 135/81 | HR 104 | Temp 97.6°F | Ht 74.0 in | Wt 235.1 lb

## 2020-02-17 DIAGNOSIS — F411 Generalized anxiety disorder: Secondary | ICD-10-CM

## 2020-02-17 DIAGNOSIS — N049 Nephrotic syndrome with unspecified morphologic changes: Secondary | ICD-10-CM | POA: Diagnosis not present

## 2020-02-17 DIAGNOSIS — F4323 Adjustment disorder with mixed anxiety and depressed mood: Secondary | ICD-10-CM

## 2020-02-17 DIAGNOSIS — E781 Pure hyperglyceridemia: Secondary | ICD-10-CM | POA: Diagnosis not present

## 2020-02-17 DIAGNOSIS — Z0000 Encounter for general adult medical examination without abnormal findings: Secondary | ICD-10-CM

## 2020-02-17 DIAGNOSIS — I151 Hypertension secondary to other renal disorders: Secondary | ICD-10-CM | POA: Diagnosis not present

## 2020-02-17 MED ORDER — CITALOPRAM HYDROBROMIDE 40 MG PO TABS: 40.0000 mg | ORAL_TABLET | Freq: Every day | ORAL | 1 refills | Status: DC

## 2020-02-17 MED ORDER — LOSARTAN POTASSIUM 50 MG PO TABS: 50.0000 mg | ORAL_TABLET | Freq: Every day | ORAL | 1 refills | Status: DC

## 2020-02-17 NOTE — Patient Instructions (Signed)

## 2020-02-17 NOTE — Progress Notes (Signed)
Subjective:   George [name] is a 69 y.o. male who presents for Medicare Annual/Subsequent preventive examination.  Review of Systems    General:   No F/C, wt loss Pulm:   No DIB, SOB, pleuritic chest pain Card:  No CP, palpitations Abd:  No n/v/d or pain Ext:  No inc edema from baseline  Objective:    Today's Vitals   02/17/20 0933  BP: 135/81  Pulse: (!) 104  Temp: 97.6 F (36.4 C)  TempSrc: Oral  SpO2: 96%  Weight: 235 lb 1.6 oz (106.6 kg)  Height: 6\' 2"  (1.88 m)   Body mass index is 30.19 kg/m.  Advanced Directives 12/23/2019 12/22/2019 11/17/2019 11/14/2019 11/08/2019 04/29/2017 03/03/2016  Does Patient Have a Medical Advance Directive? Yes No Yes Yes Yes Yes Yes  Type of Paramedic of [place];Living will - [place];Living will [place];Living will Living will;Healthcare Power of [place];Living will [place];Living will  Does patient want to make changes to medical advance directive? No - Patient declined - No - Patient declined - - - -  Copy of [place] in Chart? No - copy requested - - - - - -  Would patient like information on creating a medical advance directive? - - - - - - -    Current Medications (verified) Outpatient Encounter Medications as of 02/17/2020  Medication Sig  . aspirin EC 81 MG tablet Take 1 tablet (81 mg total) by mouth 2 (two) times daily.  . citalopram (CELEXA) 40 MG tablet TAKE 1 TABLET BY MOUTH DAILY. (Patient taking differently: Take 40 mg by mouth daily. )  . losartan (COZAAR) 50 MG tablet TAKE 1 TABLET (50 MG TOTAL) BY MOUTH DAILY.  . montelukast (SINGULAIR) 10 MG tablet TAKE 1 TABLET BY MOUTH AT BEDTIME. FOR BREATHING/ALLERGIES (Patient taking differently: Take 10 mg by mouth at bedtime. )  . rosuvastatin (CRESTOR) 40 MG tablet Take 0.5 tablets (20 mg total) by mouth at bedtime.  Marland Kitchen tiZANidine (ZANAFLEX) 2 MG  tablet Take 1 tablet (2 mg total) by mouth every 6 (six) hours as needed.  . Vitamin D, Ergocalciferol, (DRISDOL) 1.25 MG (50000 UNIT) CAPS capsule Take one tablet wkly (Patient taking differently: Take 50,000 Units by mouth every 7 (seven) days. )   No facility-administered encounter medications on file as of 02/17/2020.    Allergies (verified) Patient has no known allergies.   History: Past Medical History:  Diagnosis Date  . AAA (abdominal aortic aneurysm) ([place])    a. [place] 09/2015 - 3cm distal AAA. //  b. [place] 5/17: 3.1 cm   . Basal cell carcinoma of right side of nose   . Berger's disease   . CKD (chronic kidney disease) 09/2015  . COPD (chronic obstructive pulmonary disease) ([place])   . GERD (gastroesophageal reflux disease)   . Nephrotic syndrome   . Panic attacks   . Sleep apnea   . SVT (supraventricular tachycardia) ([place]) 05/22/2015   Past Surgical History:  Procedure Laterality Date  . BASAL CELL CARCINOMA EXCISION Right ~ 06/2015   "side of my nose"  . ELECTROPHYSIOLOGIC STUDY N/A 09/10/2015   Procedure: SVT Ablation;  Surgeon: Evans Lance, MD;  Location: [facility];  Service: Cardiovascular;  Laterality: N/A;  . FEMUR FRACTURE SURGERY Right 1970   "had plate & pin put in"  . FEMUR HARDWARE REMOVAL Right 1971   "removed plate; left pin  in  . FRACTURE SURGERY    . KNEE ARTHROSCOPY Right ~ 1985  . TONSILLECTOMY AND ADENOIDECTOMY  ~ 1955  . TOTAL HIP ARTHROPLASTY Left 11/14/2019   Procedure: LEFT TOTAL HIP ARTHROPLASTY ANTERIOR APPROACH;  Surgeon: Frederik Pear, MD;  Location: [facility];  Service: Orthopedics;  Laterality: Left;   Family History  Problem Relation Age of Onset  . Heart disease Mother        Before age 49  . Heart attack Mother   . Cancer Mother        lung and colon  . Stroke Mother   . Heart failure Father   . Heart disease Father        After age 26  . Heart attack Father   . Hyperlipidemia Father   . Healthy Maternal Grandmother   . Healthy  Maternal Grandfather   . Diabetes Paternal Grandmother   . Healthy Paternal Grandfather   . Hypertension Neg Hx    Social History   Socioeconomic History  . Marital status: Divorced    Spouse name: Not on file  . Number of children: 0  . Years of education: 58  . Highest education level: Not on file  Occupational History  . Occupation: Retired  Tobacco Use  . Smoking status: Former Smoker    Packs/day: 2.00    Years: 50.00    Pack years: 100.00    Types: Cigarettes    Quit date: 01/24/2015    Years since quitting: 5.0  . Smokeless tobacco: Never Used  Vaping Use  . Vaping Use: Never used  Substance and Sexual Activity  . Alcohol use: Not Currently    Alcohol/week: 3.0 - 4.0 standard drinks    Types: 3 - 4 Shots of liquor per week  . Drug use: No  . Sexual activity: Not Currently  Other Topics Concern  . Not on file  Social History Narrative   Fun: Trade out work, outdoors   Denies religious beliefs effecting health care.    Social Determinants of Health   Financial Resource Strain:   . Difficulty of Paying Living Expenses:   Food Insecurity:   . Worried About Charity fundraiser in the Last Year:   . Arboriculturist in the Last Year:   Transportation Needs:   . Film/video editor (Medical):   Marland Kitchen Lack of Transportation (Non-Medical):   Physical Activity:   . Days of Exercise per Week:   . Minutes of Exercise per Session:   Stress:   . Feeling of Stress :   Social Connections:   . Frequency of Communication with Friends and Family:   . Frequency of Social Gatherings with Friends and Family:   . Attends Religious Services:   . Active Member of Clubs or Organizations:   . Attends Archivist Meetings:   Marland Kitchen Marital Status:     Tobacco Counseling Counseling given: Not Answered  Diabetic? No    Activities of Daily Living In your present state of health, do you have any difficulty performing the following activities: 02/17/2020 12/23/2019   Hearing? N N  Vision? N N  Difficulty concentrating or making decisions? N Y  Walking or climbing stairs? Y Y  Dressing or bathing? N Y  Doing errands, shopping? N N  Some recent data might be hidden    Patient Care Team: Lorrene Reid, PA-C as PCP - General [name] Pearletha Forge, MD as Consulting Physician (Cardiology) Estanislado Emms, MD as Consulting Physician (  Nephrology) Evans Lance, MD as Consulting Physician (Clinical Cardiac Electrophysiology) Comer, Okey Regal, MD as Consulting Physician (Infectious Diseases) Sharmon Revere as Physician Assistant (Cardiology) Dillingham, Loel Lofty, DO as Attending Physician (Plastic Surgery)  Indicate any recent Medical Services you may have received from other than [facility] providers in the past year (date may be approximate).     Assessment:   This is a routine wellness examination for Jovanni.  Hearing/Vision screen No exam data present  Dietary issues and exercise activities discussed: -Follow heart healthy diet and recommend to increase physical activity- stationary bike 3 times per week for at least 30 minutes  Goals   None    Depression Screen PHQ 2/9 Scores 02/17/2020 09/16/2019 08/30/2019 08/25/2019 06/17/2019 06/09/2019 02/09/2019  PHQ - 2 Score 0 0 0 0 0 0 3  PHQ- 9 Score 1 0 0 0 2 4 9     Fall Risk Fall Risk  02/17/2020 08/30/2019 08/25/2019 06/17/2019 06/09/2019  Falls in the past year? 0 1 1 1 1   Number falls in past yr: - 0 0 0 0  Injury with Fall? - 1 1 1 1   Risk Factor Category  - - - - -  Risk for fall due to : - - - - -  Follow up Falls evaluation completed Falls evaluation completed Falls evaluation completed Falls evaluation completed -    Any stairs in or around the home? Yes  If so, are there any without handrails? Yes  Home free of loose throw rugs in walkways, pet beds, electrical cords, etc? Yes  Adequate lighting in your home to reduce risk of falls? Yes   ASSISTIVE DEVICES UTILIZED TO PREVENT  FALLS:  Life alert? No  Use of a cane, walker or w/c? No  Grab bars in the bathroom? Yes  Shower chair or bench in shower? Yes  Elevated toilet seat or a handicapped toilet? Yes   TIMED UP AND GO:  Was the test performed? Yes .  Length of time to ambulate 10 feet: 13 sec.   Gait slow and steady without use of assistive device  Cognitive Function: wnl MMSE - Mini Mental State Exam 01/31/2015  Orientation to time 5  Orientation to Place 5  Registration 3  Attention/ Calculation 5  Recall 3  Language- name 2 objects 2  Language- repeat 1  Language- follow 3 step command 3  Language- read & follow direction 1  Write a sentence 1  Copy design 1  Total score 30     6CIT Screen 02/17/2020 08/25/2019 04/29/2017  What Year? 0 points 0 points 0 points  What month? 0 points 0 points 0 points  What time? 0 points 0 points 0 points  Count back from 20 0 points 0 points 0 points  Months in reverse 0 points 0 points 0 points  Repeat phrase 0 points 4 points 0 points  Total Score 0 4 0    Immunizations Immunization History  Administered Date(s) Administered  . Hep A / Hep B 04/29/2017  . Pneumococcal Conjugate-13 04/29/2017  . Pneumococcal Polysaccharide-23 08/23/2015  . Tdap 01/31/2015    TDAP status: Up to date Flu Vaccine status: Up to date Pneumococcal vaccine status: Up to date Covid-19 vaccine status: Information provided on how to obtain vaccines.   Qualifies for Shingles Vaccine? Yes   Zostavax completed No   Shingrix Completed?: No.    Education has been provided regarding the importance of this vaccine. Patient has been advised to call  insurance company to determine out of pocket expense if they have not yet received this vaccine. Advised may also receive vaccine at local pharmacy or Health Dept. Verbalized acceptance and understanding.  Screening Tests Health Maintenance  Topic Date Due  . COVID-19 Vaccine (1) Never done  . COLONOSCOPY  Never done  . INFLUENZA  VACCINE  03/11/2020  . TETANUS/TDAP  01/30/2025  . Hepatitis C Screening  Completed  . PNA vac Low Risk Adult  Completed    Health Maintenance  Health Maintenance Due  Topic Date Due  . COVID-19 Vaccine (1) Never done  . COLONOSCOPY  Never done    Patient decline Colonoscopy  Lung Cancer Screening: (Low Dose CT Chest recommended if Age 56-80 years, 30 pack-year currently smoking OR have quit w/in 15years.) does qualify. Patient Declined  Lung Cancer Screening Referral: Patient declined  Additional Screening:  Hepatitis C Screening: does not qualify; Completed   Vision Screening: Recommended annual ophthalmology exams for early detection of glaucoma and other disorders of the eye. Is the patient up to date with their annual eye exam?  Yes  Who is the provider or what is the name of the office in which the patient attends annual eye exams?  If pt is not established with a provider, would they like to be referred to a provider to establish care? Yes .   Dental Screening: Recommended annual dental exams for proper oral hygiene  Community Resource Referral / Chronic Care Management: CRR required this visit?  No   CCM required this visit?  No, pt reports he has no concerns     Plan:  -Continue to follow-up with various specialists. -Continue current medication regimen.  Provided requested med refills. -  Patient reports he forgets to take his rosuvastatin dose at bedtime. Checking lipid panel and CMP today. -Follow up in 4 months for regular OV: HTN, HLD, Mood  I have personally reviewed and noted the following in the patient's chart:   . Medical and social history . Use of alcohol, tobacco or illicit drugs  . Current medications and supplements . Functional ability and status . Nutritional status . Physical activity . Advanced directives . List of other physicians . Hospitalizations, surgeries, and ER visits in previous 12 months . Vitals . Screenings to include  cognitive, depression, and falls . Referrals and appointments  In addition, I have reviewed and discussed with patient certain preventive protocols, quality metrics, and best practice recommendations. A written personalized care plan for preventive services as well as general preventive health recommendations were provided to patient.     Lorrene Reid, PA-C   02/17/2020   Nurse Notes: none

## 2020-02-18 LAB — COMPREHENSIVE METABOLIC PANEL
ALT: 13 IU/L (ref 0–44)
AST: 17 IU/L (ref 0–40)
Albumin/Globulin Ratio: 1.8 (ref 1.2–2.2)
Albumin: 4.6 g/dL (ref 3.8–4.8)
Alkaline Phosphatase: 74 IU/L (ref 48–121)
BUN/Creatinine Ratio: 18 (ref 10–24)
BUN: 23 mg/dL (ref 8–27)
Bilirubin Total: 0.3 mg/dL (ref 0.0–1.2)
CO2: 20 mmol/L (ref 20–29)
Calcium: 9.3 mg/dL (ref 8.6–10.2)
Chloride: 102 mmol/L (ref 96–106)
Creatinine, Ser: 1.27 mg/dL (ref 0.76–1.27)
GFR calc Af Amer: 66 mL/min/{1.73_m2} (ref >59)
GFR calc non Af Amer: 57 mL/min/{1.73_m2} — ABNORMAL LOW (ref >59)
Globulin, Total: 2.5 g/dL (ref 1.5–4.5)
Glucose: 94 mg/dL (ref 65–99)
Potassium: 5.4 mmol/L — ABNORMAL HIGH (ref 3.5–5.2)
Sodium: 137 mmol/L (ref 134–144)
Total Protein: 7.1 g/dL (ref 6.0–8.5)

## 2020-02-18 LAB — LIPID PANEL
Chol/HDL Ratio: 3.5 ratio (ref 0.0–5.0)
Cholesterol, Total: 183 mg/dL (ref 100–199)
HDL: 53 mg/dL (ref >39)
LDL Chol Calc (NIH): 111 mg/dL — ABNORMAL HIGH (ref 0–99)
Triglycerides: 107 mg/dL (ref 0–149)
VLDL Cholesterol Cal: 19 mg/dL (ref 5–40)

## 2020-02-27 ENCOUNTER — Other Ambulatory Visit: Payer: Self-pay | Admitting: Physician Assistant

## 2020-02-27 DIAGNOSIS — E875 Hyperkalemia: Secondary | ICD-10-CM

## 2020-03-07 ENCOUNTER — Other Ambulatory Visit: Payer: Medicare Other

## 2020-07-16 ENCOUNTER — Telehealth: Payer: Self-pay | Admitting: Physician Assistant

## 2020-07-16 ENCOUNTER — Other Ambulatory Visit: Payer: Self-pay | Admitting: Physician Assistant

## 2020-07-16 DIAGNOSIS — N049 Nephrotic syndrome with unspecified morphologic changes: Secondary | ICD-10-CM

## 2020-07-16 DIAGNOSIS — I151 Hypertension secondary to other renal disorders: Secondary | ICD-10-CM

## 2020-07-16 NOTE — Telephone Encounter (Signed)
Please call patient to schedule an apt for med refills. AS, CMA

## 2020-07-17 NOTE — Telephone Encounter (Signed)
Left voicemail 12-7

## 2020-08-13 ENCOUNTER — Other Ambulatory Visit: Payer: Self-pay | Admitting: Physician Assistant

## 2020-08-13 DIAGNOSIS — F4323 Adjustment disorder with mixed anxiety and depressed mood: Secondary | ICD-10-CM

## 2020-08-13 DIAGNOSIS — F411 Generalized anxiety disorder: Secondary | ICD-10-CM

## 2020-09-07 ENCOUNTER — Ambulatory Visit (INDEPENDENT_AMBULATORY_CARE_PROVIDER_SITE_OTHER): Payer: Medicare Other | Admitting: Physician Assistant

## 2020-09-07 ENCOUNTER — Other Ambulatory Visit: Payer: Self-pay

## 2020-09-07 ENCOUNTER — Encounter: Payer: Self-pay | Admitting: Physician Assistant

## 2020-09-07 VITALS — BP 126/85 | HR 95 | Ht 74.0 in | Wt 255.8 lb

## 2020-09-07 DIAGNOSIS — I471 Supraventricular tachycardia: Secondary | ICD-10-CM | POA: Diagnosis not present

## 2020-09-07 DIAGNOSIS — R21 Rash and other nonspecific skin eruption: Secondary | ICD-10-CM | POA: Diagnosis not present

## 2020-09-07 DIAGNOSIS — F411 Generalized anxiety disorder: Secondary | ICD-10-CM

## 2020-09-07 DIAGNOSIS — E785 Hyperlipidemia, unspecified: Secondary | ICD-10-CM

## 2020-09-07 DIAGNOSIS — N049 Nephrotic syndrome with unspecified morphologic changes: Secondary | ICD-10-CM | POA: Diagnosis not present

## 2020-09-07 DIAGNOSIS — I151 Hypertension secondary to other renal disorders: Secondary | ICD-10-CM | POA: Diagnosis not present

## 2020-09-07 DIAGNOSIS — J449 Chronic obstructive pulmonary disease, unspecified: Secondary | ICD-10-CM | POA: Diagnosis not present

## 2020-09-07 MED ORDER — SERTRALINE HCL 25 MG PO TABS: 25.0000 mg | ORAL_TABLET | Freq: Every day | ORAL | 2 refills | Status: DC

## 2020-09-07 MED ORDER — LOSARTAN POTASSIUM 50 MG PO TABS: 50.0000 mg | ORAL_TABLET | Freq: Every day | ORAL | 1 refills | Status: DC

## 2020-09-07 MED ORDER — DESONIDE 0.05 % EX CREA: TOPICAL_CREAM | Freq: Two times a day (BID) | CUTANEOUS | 0 refills | Status: DC

## 2020-09-07 NOTE — Assessment & Plan Note (Signed)
-  Reviewed previous chest X-rays which revealed underlying chronic interstitial lung disease ( 09/2015) and emphysema (11/2019). -Unable to locate prior Pulmonology records. -Patient will benefit from pulmonology consultation and PFTs. However, not interested at this time.

## 2020-09-07 NOTE — Patient Instructions (Signed)
Tapering instructions: -Take 40 mg once daily x 1 week, then take 20 mg once daily x 1 week. Then start Sertraline 25 mg once daily x 6 weeks. If dose ineffective, let me know and will increase dose to 50 mg.    Heart-Healthy Eating Plan Many factors influence your heart (coronary) health, including eating and exercise habits. Coronary risk increases with abnormal blood fat (lipid) levels. Heart-healthy meal planning includes limiting unhealthy fats, increasing healthy fats, and making other diet and lifestyle changes. What is my plan? Your health care provider may recommend that you:  Limit your fat intake to _________% or less of your total calories each day.  Limit your saturated fat intake to _________% or less of your total calories each day.  Limit the amount of cholesterol in your diet to less than _________ mg per day. What are tips for following this plan? Cooking Cook foods using methods other than frying. Baking, boiling, grilling, and broiling are all good options. Other ways to reduce fat include:  Removing the skin from poultry.  Removing all visible fats from meats.  Steaming vegetables in water or broth. Meal planning  At meals, imagine dividing your plate into fourths: ? Fill one-half of your plate with vegetables and green salads. ? Fill one-fourth of your plate with whole grains. ? Fill one-fourth of your plate with lean protein foods.  Eat 4-5 servings of vegetables per day. One serving equals 1 cup raw or cooked vegetable, or 2 cups raw leafy greens.  Eat 4-5 servings of fruit per day. One serving equals 1 medium whole fruit,  cup dried fruit,  cup fresh, frozen, or canned fruit, or  cup 100% fruit juice.  Eat more foods that contain soluble fiber. Examples include apples, broccoli, carrots, beans, peas, and barley. Aim to get 25-30 g of fiber per day.  Increase your consumption of legumes, nuts, and seeds to 4-5 servings per week. One serving of dried  beans or legumes equals  cup cooked, 1 serving of nuts is  cup, and 1 serving of seeds equals 1 tablespoon.   Fats  Choose healthy fats more often. Choose monounsaturated and polyunsaturated fats, such as olive and canola oils, flaxseeds, walnuts, almonds, and seeds.  Eat more omega-3 fats. Choose salmon, mackerel, sardines, tuna, flaxseed oil, and ground flaxseeds. Aim to eat fish at least 2 times each week.  Check food labels carefully to identify foods with trans fats or high amounts of saturated fat.  Limit saturated fats. These are found in animal products, such as meats, butter, and cream. Plant sources of saturated fats include palm oil, palm kernel oil, and coconut oil.  Avoid foods with partially hydrogenated oils in them. These contain trans fats. Examples are stick margarine, some tub margarines, cookies, crackers, and other baked goods.  Avoid fried foods. General information  Eat more home-cooked food and less restaurant, buffet, and fast food.  Limit or avoid alcohol.  Limit foods that are high in starch and sugar.  Lose weight if you are overweight. Losing just 5-10% of your body weight can help your overall health and prevent diseases such as diabetes and heart disease.  Monitor your salt (sodium) intake, especially if you have high blood pressure. Talk with your health care provider about your sodium intake.  Try to incorporate more vegetarian meals weekly. What foods can I eat? Fruits All fresh, canned (in natural juice), or frozen fruits. Vegetables Fresh or frozen vegetables (raw, steamed, roasted, or grilled). Green salads.  Grains Most grains. Choose whole wheat and whole grains most of the time. Rice and pasta, including brown rice and pastas made with whole wheat. Meats and other proteins Lean, well-trimmed beef, veal, pork, and lamb. Chicken and [place] without skin. All fish and shellfish. Wild duck, rabbit, pheasant, and venison. Egg whites or  low-cholesterol egg substitutes. Dried beans, peas, lentils, and tofu. Seeds and most nuts. Dairy Low-fat or nonfat cheeses, including ricotta and mozzarella. Skim or 1% milk (liquid, powdered, or evaporated). Buttermilk made with low-fat milk. Nonfat or low-fat yogurt. Fats and oils Non-hydrogenated (trans-free) margarines. Vegetable oils, including soybean, sesame, sunflower, olive, peanut, safflower, corn, canola, and cottonseed. Salad dressings or mayonnaise made with a vegetable oil. Beverages Water (mineral or sparkling). Coffee and tea. Diet carbonated beverages. Sweets and desserts Sherbet, gelatin, and fruit ice. Small amounts of dark chocolate. Limit all sweets and desserts. Seasonings and condiments All seasonings and condiments. The items listed above may not be a complete list of foods and beverages you can eat. Contact a dietitian for more options. What foods are not recommended? Fruits Canned fruit in heavy syrup. Fruit in cream or butter sauce. Fried fruit. Limit coconut. Vegetables Vegetables cooked in cheese, cream, or butter sauce. Fried vegetables. Grains Breads made with saturated or trans fats, oils, or whole milk. Croissants. Sweet rolls. Donuts. High-fat crackers, such as cheese crackers. Meats and other proteins Fatty meats, such as hot dogs, ribs, sausage, bacon, rib-eye roast or steak. High-fat deli meats, such as salami and bologna. Caviar. Domestic duck and goose. Organ meats, such as liver. Dairy Cream, sour cream, cream cheese, and creamed cottage cheese. Whole milk cheeses. Whole or 2% milk (liquid, evaporated, or condensed). Whole buttermilk. Cream sauce or high-fat cheese sauce. Whole-milk yogurt. Fats and oils Meat fat, or shortening. Cocoa butter, hydrogenated oils, palm oil, coconut oil, palm kernel oil. Solid fats and shortenings, including bacon fat, salt pork, lard, and butter. Nondairy cream substitutes. Salad dressings with cheese or sour  cream. Beverages Regular sodas and any drinks with added sugar. Sweets and desserts Frosting. Pudding. Cookies. Cakes. Pies. Milk chocolate or white chocolate. Buttered syrups. Full-fat ice cream or ice cream drinks. The items listed above may not be a complete list of foods and beverages to avoid. Contact a dietitian for more information. Summary  Heart-healthy meal planning includes limiting unhealthy fats, increasing healthy fats, and making other diet and lifestyle changes.  Lose weight if you are overweight. Losing just 5-10% of your body weight can help your overall health and prevent diseases such as diabetes and heart disease.  Focus on eating a balance of foods, including fruits and vegetables, low-fat or nonfat dairy, lean protein, nuts and legumes, whole grains, and heart-healthy oils and fats. This information is not intended to replace advice given to you by your health care provider. Make sure you discuss any questions you have with your health care provider. Document Revised: 09/04/2017 Document Reviewed: 09/04/2017 Elsevier Patient Education  2021 [employer].

## 2020-09-07 NOTE — Assessment & Plan Note (Signed)
-  s/p AVNRT ablation in 2017 -Asymptomatic.

## 2020-09-07 NOTE — Assessment & Plan Note (Signed)
-  Last CMP: Cr 1.27, GFR 57 -Will continue to monitor.

## 2020-09-07 NOTE — Assessment & Plan Note (Signed)
-  PHQ-9 score of 10, increased from prior.  Discussed with patient alternative treatment options and agreeable to switch from Celexa to Zoloft.  Provided tapering instructions.  Advised to let me know if unable to tolerate Zoloft or if dose ineffective. -Follow-up in 3 months to reassess symptoms and medication therapy.

## 2020-09-07 NOTE — Assessment & Plan Note (Signed)
-  Controlled. -Continue current medication regimen. Provided refill. -Recommend to follow low sodium diet and stay well hydrated. -Will continue to monitor.

## 2020-09-07 NOTE — Progress Notes (Signed)
Established Patient Office Visit  Subjective:  Patient ID: George [name], male    DOB: 1951-03-10  Age: 70 y.o. MRN: [id number]  CC:  Chief Complaint  Patient presents with  . Depression  . Hyperlipidemia  . Hypertension    HPI [name] presents for follow-up on mood hypertension, hyperlipidemia and mood.  Has complaints of rash on his face for about a month. Denies pruritus, pain, new soaps or detergents.  HTN: Pt denies chest pain, palpitations, dizziness or lower extremity swelling.  Has been out of blood pressure medication for 1 week.  Tries to stay hydrated.  HLD: Pt self discontinued Crestor. Reports finances are limited and did not see benefit of taking medication.  Mood: Feels like Celexa is not working anymore. Has noticed getting anxious especially towards the evening and finds his mind racing.  Has been on medication for 4-5 years. Denies SI/HI. Has not tried other medications in the past except Ambien for sleep.  COPD: States breathing is about the same. Gets winded with activity. Tried Trelegy in the past but discontinued due to thrush and is not interested on trying different inhaler if its not beneficial. States has seen pulmonology in the past.  Past Medical History:  Diagnosis Date  . AAA (abdominal aortic aneurysm) ([place])    a. [place] 09/2015 - 3cm distal AAA. //  b. [place] 5/17: 3.1 cm   . Basal cell carcinoma of right side of nose   . Berger's disease   . CKD (chronic kidney disease) 09/2015  . COPD (chronic obstructive pulmonary disease) ([place])   . GERD (gastroesophageal reflux disease)   . Nephrotic syndrome   . Panic attacks   . Sleep apnea   . SVT (supraventricular tachycardia) ([place]) 05/22/2015    Past Surgical History:  Procedure Laterality Date  . BASAL CELL CARCINOMA EXCISION Right ~ 06/2015   "side of my nose"  . ELECTROPHYSIOLOGIC STUDY N/A 09/10/2015   Procedure: SVT Ablation;  Surgeon: Evans Lance, MD;  Location: [facility];   Service: Cardiovascular;  Laterality: N/A;  . FEMUR FRACTURE SURGERY Right 1970   "had plate & pin put in"  . FEMUR HARDWARE REMOVAL Right 1971   "removed plate; left pin in  . FRACTURE SURGERY    . KNEE ARTHROSCOPY Right ~ 1985  . TONSILLECTOMY AND ADENOIDECTOMY  ~ 1955  . TOTAL HIP ARTHROPLASTY Left 11/14/2019   Procedure: LEFT TOTAL HIP ARTHROPLASTY ANTERIOR APPROACH;  Surgeon: Frederik Pear, MD;  Location: [facility];  Service: Orthopedics;  Laterality: Left;    Family History  Problem Relation Age of Onset  . Heart disease Mother        Before age 75  . Heart attack Mother   . Cancer Mother        lung and colon  . Stroke Mother   . Heart failure Father   . Heart disease Father        After age 88  . Heart attack Father   . Hyperlipidemia Father   . Healthy Maternal Grandmother   . Healthy Maternal Grandfather   . Diabetes Paternal Grandmother   . Healthy Paternal Grandfather   . Hypertension Neg Hx     Social History   Socioeconomic History  . Marital status: Divorced    Spouse name: Not on file  . Number of children: 0  . Years of education: 34  . Highest education level: Not on file  Occupational History  . Occupation: Retired  Tobacco Use  . Smoking status: Former Smoker    Packs/day: 2.00    Years: 50.00    Pack years: 100.00    Types: Cigarettes    Quit date: 01/24/2015    Years since quitting: 5.6  . Smokeless tobacco: Never Used  Vaping Use  . Vaping Use: Never used  Substance and Sexual Activity  . Alcohol use: Not Currently    Alcohol/week: 3.0 - 4.0 standard drinks    Types: 3 - 4 Shots of liquor per week  . Drug use: No  . Sexual activity: Not Currently  Other Topics Concern  . Not on file  Social History Narrative   Fun: Trade out work, outdoors   Denies religious beliefs effecting health care.    Social Determinants of Health   Financial Resource Strain: Not on file  Food Insecurity: Not on file  Transportation Needs: Not on file   Physical Activity: Not on file  Stress: Not on file  Social Connections: Not on file  Intimate Partner Violence: Not on file    Outpatient Medications Prior to Visit  Medication Sig Dispense Refill  . aspirin EC 81 MG tablet Take 1 tablet (81 mg total) by mouth 2 (two) times daily. 60 tablet 0  . citalopram (CELEXA) 40 MG tablet Take 1 tablet (40 mg total) by mouth daily. **NEEDS APT FOR MED REFILLS** 60 tablet 0  . losartan (COZAAR) 50 MG tablet Take 1 tablet (50 mg total) by mouth daily. **NEED APT FOR MED REFILLS** 60 tablet 0  . montelukast (SINGULAIR) 10 MG tablet TAKE 1 TABLET BY MOUTH AT BEDTIME. FOR BREATHING/ALLERGIES (Patient not taking: Reported on 09/07/2020) 90 tablet 1  . rosuvastatin (CRESTOR) 40 MG tablet Take 0.5 tablets (20 mg total) by mouth at bedtime. (Patient not taking: Reported on 09/07/2020) 45 tablet 0  . tiZANidine (ZANAFLEX) 2 MG tablet Take 1 tablet (2 mg total) by mouth every 6 (six) hours as needed. (Patient not taking: Reported on 09/07/2020) 60 tablet 0  . Vitamin D, Ergocalciferol, (DRISDOL) 1.25 MG (50000 UNIT) CAPS capsule Take one tablet wkly (Patient not taking: Reported on 09/07/2020) 12 capsule 3   No facility-administered medications prior to visit.    No Known Allergies  ROS Review of Systems A fourteen system review of systems was performed and found to be positive as per HPI.  Objective:    Physical Exam General:  Well Developed, well nourished, appropriate for stated age.  Neuro:  Alert and oriented,  extra-ocular muscles intact  HEENT:  Normocephalic, atraumatic, neck supple Skin:  Small flat macules along mandible present Cardiac:  RRR, S1 S2 w/o murmur Respiratory:  ECTA with scattered rhonchi, Not using accessory muscles, speaking in full sentences- unlabored. Vascular:  Ext warm, no cyanosis apprec.; cap RF less 2 sec. Psych:  No HI/SI, judgement and insight good, Euthymic mood. Full Affect.   BP 126/85   Pulse 95   Ht 6\' 2"   (1.88 m)   Wt 255 lb 12.8 oz (116 kg)   SpO2 96%   BMI 32.84 kg/m  Wt Readings from Last 3 Encounters:  09/07/20 255 lb 12.8 oz (116 kg)  02/17/20 235 lb 1.6 oz (106.6 kg)  11/17/19 242 lb 15.2 oz (110.2 kg)     Health Maintenance Due  Topic Date Due  . COVID-19 Vaccine (1) Never done  . COLONOSCOPY (Pts 45-32yrs Insurance coverage will need to be confirmed)  Never done  . INFLUENZA VACCINE  Never done  There are no preventive care reminders to display for this patient.  Lab Results  Component Value Date   TSH 2.920 08/25/2019   Lab Results  Component Value Date   WBC 6.4 12/23/2019   HGB 11.7 (L) 12/23/2019   HCT 36.6 (L) 12/23/2019   MCV 95.1 12/23/2019   PLT 236 12/23/2019   Lab Results  Component Value Date   NA 137 02/17/2020   K 5.4 (H) 02/17/2020   CO2 20 02/17/2020   GLUCOSE 94 02/17/2020   BUN 23 02/17/2020   CREATININE 1.27 02/17/2020   BILITOT 0.3 02/17/2020   ALKPHOS 74 02/17/2020   AST 17 02/17/2020   ALT 13 02/17/2020   PROT 7.1 02/17/2020   ALBUMIN 4.6 02/17/2020   CALCIUM 9.3 02/17/2020   ANIONGAP 9 12/23/2019   GFR 61.36 08/29/2015   GFR 61.36 08/29/2015   Lab Results  Component Value Date   CHOL 183 02/17/2020   Lab Results  Component Value Date   HDL 53 02/17/2020   Lab Results  Component Value Date   LDLCALC 111 (H) 02/17/2020   Lab Results  Component Value Date   TRIG 107 02/17/2020   Lab Results  Component Value Date   CHOLHDL 3.5 02/17/2020   Lab Results  Component Value Date   HGBA1C 5.2 08/25/2019      Assessment & Plan:   Problem List Items Addressed This Visit      Cardiovascular and Mediastinum   h/o SVT (supraventricular tachycardia) s/p Ablation (Chronic)    -s/p AVNRT ablation in 2017 -Asymptomatic.      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 -Continue current medication regimen. Provided refill. -Recommend  to follow low sodium diet and stay well hydrated. -Will continue to monitor.      Relevant Medications   losartan (COZAAR) 50 MG tablet     Respiratory   COPD mixed type (HCC) (Chronic)    -Reviewed previous chest X-rays which revealed underlying chronic interstitial lung disease ( 09/2015) and emphysema (11/2019). -Unable to locate prior Pulmonology records. -Patient will benefit from pulmonology consultation and PFTs. However, not interested at this time.        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      Relevant Medications   losartan (COZAAR) 50 MG tablet     Other   GAD (generalized anxiety disorder) (Chronic)    -PHQ-9 score of 10, increased from prior.  Discussed with patient alternative treatment options and agreeable to switch from Celexa to Zoloft.  Provided tapering instructions.  Advised to let me know if unable to tolerate Zoloft or if dose ineffective. -Follow-up in 3 months to reassess symptoms and medication therapy.      Relevant Medications   sertraline (ZOLOFT) 25 MG tablet   HLD (hyperlipidemia) (Chronic)    -Last lipid panel wnl's with exception of elevated LDL at 111. -Discussed potential CVD risks and declines resuming statin therapy. Recommend to follow a diet low in saturated and trans fats. -Continue to stay as active as possible. -Will continue to monitor.      Relevant Medications   losartan (COZAAR) 50 MG tablet    Other Visit Diagnoses    Rash       Relevant Medications   desonide (DESOWEN) 0.05 % cream  Rash:  -Appearance, pattern and location along mandible suggests possible irritant dermatitis from wearing mask. Will send low potency topical corticosteroid to apply. -If symptoms fail to improve or worsen recommend further evaluation.    Meds ordered this encounter  Medications  . sertraline (ZOLOFT) 25 MG tablet    Sig: Take 1  tablet (25 mg total) by mouth daily.    Dispense:  30 tablet    Refill:  2    Order Specific Question:   Supervising Provider    Answer:   Beatrice Lecher D [[id number]]  . losartan (COZAAR) 50 MG tablet    Sig: Take 1 tablet (50 mg total) by mouth daily.    Dispense:  90 tablet    Refill:  1    Order Specific Question:   Supervising Provider    Answer:   Beatrice Lecher D [[id number]]  . desonide (DESOWEN) 0.05 % cream    Sig: Apply topically 2 (two) times daily.    Dispense:  30 g    Refill:  0    Order Specific Question:   Supervising Provider    Answer:   Beatrice Lecher D [[id number]]    Follow-up: Return in about 3 months (around 12/06/2020) for Mood- changed med.   Note:  This note was prepared with assistance of Dragon voice recognition software. Occasional wrong-word or sound-a-like substitutions may have occurred due to the inherent limitations of voice recognition software.  Lorrene Reid, PA-C

## 2020-09-07 NOTE — Assessment & Plan Note (Signed)
-  Last lipid panel wnl's with exception of elevated LDL at 111. -Discussed potential CVD risks and declines resuming statin therapy. Recommend to follow a diet low in saturated and trans fats. -Continue to stay as active as possible. -Will continue to monitor.

## 2020-11-29 ENCOUNTER — Other Ambulatory Visit: Payer: Self-pay | Admitting: Physician Assistant

## 2020-11-29 DIAGNOSIS — F411 Generalized anxiety disorder: Secondary | ICD-10-CM

## 2020-12-07 ENCOUNTER — Ambulatory Visit (INDEPENDENT_AMBULATORY_CARE_PROVIDER_SITE_OTHER): Payer: Medicare Other | Admitting: Physician Assistant

## 2020-12-07 ENCOUNTER — Encounter: Payer: Self-pay | Admitting: Physician Assistant

## 2020-12-07 DIAGNOSIS — F411 Generalized anxiety disorder: Secondary | ICD-10-CM

## 2020-12-07 DIAGNOSIS — J449 Chronic obstructive pulmonary disease, unspecified: Secondary | ICD-10-CM

## 2020-12-07 DIAGNOSIS — L700 Acne vulgaris: Secondary | ICD-10-CM | POA: Diagnosis not present

## 2020-12-07 DIAGNOSIS — I151 Hypertension secondary to other renal disorders: Secondary | ICD-10-CM

## 2020-12-07 MED ORDER — AMLODIPINE BESYLATE 5 MG PO TABS: 5.0000 mg | ORAL_TABLET | Freq: Every day | ORAL | 0 refills | Status: DC

## 2020-12-07 MED ORDER — TRETINOIN 0.025 % EX CREA: TOPICAL_CREAM | Freq: Every day | CUTANEOUS | 0 refills | Status: DC

## 2020-12-07 MED ORDER — SERTRALINE HCL 50 MG PO TABS: 50.0000 mg | ORAL_TABLET | Freq: Every day | ORAL | 0 refills | Status: DC

## 2020-12-07 NOTE — Patient Instructions (Addendum)
Acne  Acne is a skin problem that causes small, red bumps (pimples) and other skin changes. The skin has tiny holes called pores. Each pore has an oil gland. Acne happens when the pores get blocked. The pores may become red, sore, and swollen. They may also become infected. Acne is common among teenagers. Acne usually goes away over time. What are the causes? This condition may be caused when:  Oil glands get blocked by oil, dead skin cells, and dirt.  Bacteria that live in the oil glands increase in number and cause infection. Acne can start with changes in hormones. These changes can occur:  When children mature into their teens (adolescence).  When women get their period (menstrual cycle).  When women are pregnant. Some things can make acne worse. They include:  Cosmetics and hair products that have oil in them.  Stress.  Diseases that cause changes in hormones.  Some medicines.  Headbands, backpacks, or shoulder pads.  Being near certain oils and chemicals.  Foods that are high in sugars. These include dairy products, sweets, and chocolates. What increases the risk? You are more likely to develop this condition if:  You are a teenager.  You have a family history of acne. What are the signs or symptoms? Symptoms of this condition include:  Small, red bumps (pimples or papules).  Whiteheads.  Blackheads.  Small, pus-filled pimples (pustules).  Big, red pimples or pustules that feel tender. Acne that is very bad can cause:  An abscess. This is an area that has pus.  Cysts. These are hard, painful sacs that have fluid.  Scars. These can happen after large pimples heal. How is this treated? Treatment for this condition depends on how bad your acne is. It may include:  Creams and lotions. These can: ? Keep the pores of your skin open. ? Prevent infections and swelling.  Medicines that treat infections (antibiotics). These can be put on your skin or taken  as pills.  Pills that decrease the amount of oil in your skin.  Birth control pills.  Light or laser treatments.  Shots of medicine into the areas with acne.  Chemicals that cause the skin to peel.  Surgery. Follow these instructions at home: Good skin care is the most important thing you can do to treat your acne. Take care of your skin as told by your doctor. You may be told to do these things:  Wash your skin gently at least two times each day. You should also wash your skin: ? After you exercise. ? Before you go to bed.  Use mild soap.  Use a water-based skin moisturizer after you wash your skin.  Use a sunscreen or sunblock with SPF 30 or greater. This is very important if you are using acne medicines.  Choose cosmetics that will not block your oil glands (are noncomedogenic). Medicines  Take over-the-counter and prescription medicines only as told by your doctor.  If you were prescribed an antibiotic medicine, use it or take it as told by your doctor. Do not stop using the antibiotic even if your acne gets better. General instructions  Keep your hair clean and off your face. Shampoo your hair on a regular basis. If you have oily hair, you may need to wash it every day.  Avoid wearing tight headbands or hats.  Avoid picking or squeezing your pimples. That can make your acne worse and cause it to scar.  Shave gently. Only shave when you have to.    Keep a food journal. This can help you see if any foods are linked to your acne.  Keep all follow-up visits as told by your doctor. This is important. Contact a doctor if:  Your acne is not better after eight weeks.  Your acne gets worse.  You have a large area of skin that is red or tender.  You think that you are having side effects from any acne medicine. Summary  Acne is a skin problem that causes pimples. Acne is common among teenagers. Acne usually goes away over time.  Acne starts with changes in your  hormones. Other causes include stress, diet, and some medicines.  Follow your doctor's instructions on how to take care of your skin. Good skin care is the most important thing you can do to treat your acne.  Take over-the-counter and prescription medicines only as told by your doctor.  Contact your doctor if you think that you are having side effects from any acne medicine. This information is not intended to replace advice given to you by your health care provider. Make sure you discuss any questions you have with your health care provider. Document Revised: 12/08/2017 Document Reviewed: 12/08/2017 Elsevier Patient Education  2021 [employer]  

## 2020-12-07 NOTE — Progress Notes (Signed)
Telehealth office visit note for George Reid, PA-C- at Primary Care at [facility]   I connected with current patient today by telephone and verified that I am speaking with the correct person   . Location of the patient: Home . Location of the provider: Office (home) - This visit type was conducted due to national recommendations for restrictions regarding the COVID-19 Pandemic (e.g. social distancing) in an effort to limit this patient's exposure and mitigate transmission in our community.    - No physical exam could be performed with this format, beyond that communicated to [place] by the patient/ family members as noted.   - Additionally my office staff/ schedulers were to discuss with the patient that there may be a monetary charge related to this service, depending on their medical insurance.  My understanding is that patient understood and consented to proceed.     _________________________________________________________________________________   History of Present Illness: Patient calls in to follow up on mood management. Patient reports medication compliance. States has not been able to notice a significant difference since changing from Celexa to Sertraline. Denies SI/HI.  HTN: Pt denies chest pain, palpitations, dizziness or increased lower extremity swelling from baseline. Taking medication as directed without side effects. Checks BP at home and has noticed most blood pressure readings have been elevated in the afternoon 180s/120s and feels symptomatic with a dull headache. Reports has taken half tablet of Losartan in the afternoon which improved his blood pressure. States when he checks blood pressure in the mornings it is usually 130-135/80-85. Pt continues to monitor sodium and denies lifestyle changes.   Rash: States skin lesions on his face are about the same. The steroid cream helped some but did not resolve the rash. No prior history of acne but does report chronic skin  issues.     COPD: Patient denies worsening shortness of breath. Patient quit smoking about 4-5 years ago.  GAD 7 : Generalized Anxiety Score 02/09/2019 05/18/2018 12/23/2016 04/23/2016  Nervous, Anxious, on Edge 3 0 2 1  Control/stop worrying 3 0 2 1  Worry too much - different things 3 0 2 1  Trouble relaxing 3 0 2 2  Restless 0 0 0 2  Easily annoyed or irritable 3 0 0 1  Afraid - awful might happen 0 0 0 1  Total GAD 7 Score 15 0 8 9  Anxiety Difficulty Very difficult Not difficult at all Not difficult at all Somewhat difficult    Depression screen [facility] 2/9 12/07/2020 09/07/2020 02/17/2020 09/16/2019 08/30/2019  Decreased Interest 0 2 0 0 0  Down, Depressed, Hopeless 0 2 0 0 0  PHQ - 2 Score 0 4 0 0 0  Altered sleeping 0 1 0 0 0  Tired, decreased energy 1 2 1  0 0  Change in appetite 0 3 0 0 0  Feeling bad or failure about yourself  0 0 0 0 0  Trouble concentrating 0 0 0 0 0  Moving slowly or fidgety/restless 0 0 0 0 0  Suicidal thoughts 0 0 0 0 0  PHQ-9 Score 1 10 1  0 0  Difficult doing work/chores - Somewhat difficult Not difficult at all - -  Some recent data might be hidden      Impression and Recommendations:     1. GAD (generalized anxiety disorder)   2. COPD mixed type ([place])   3. 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  -Discussed with patient increasing sertraline to 50 mg to improve mood. Patient is agreeable. Will send new rx. Advised to let me know if unable to tolerate increased dose. Patient verbalized understanding.  -Will obtain GAD screening at follow up visit.   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 Patient is agreeable. Discussed potential side effects including peripheral edema and advised to me know know if unable to tolerate medication. -Continue ambulatory BP/pulse  monitoring and keep a log to bring to follow up visit. -Continue low sodium diet. -Follow up in 2 weeks to reassess blood pressure and medication therapy. Will collect CMP at follow up visit.   COPD mixed type: -Stable. -Continue tobacco cessation. Patient declines treatment therapy.   Acne vulgaris: -Patient's skin lesions failed to respond to topical corticosteroid so will treat for potentially underlying acne.  -Recommend to avoid abrasive soaps.  -If symptoms fail to improve or worsen recommend referral to dermatology.    - As part of my medical decision making, I reviewed the following data within the [place] History obtained from pt /family, CMA notes reviewed and incorporated if applicable, Labs reviewed, Radiograph/ tests reviewed if applicable and OV notes from prior OV's with me, as well as any other specialists she/he has seen since seeing me last, were all reviewed and used in my medical decision making process today.    - Additionally, when appropriate, discussion had with patient regarding our treatment plan, and their biases/concerns about that plan were used in my medical decision making today.    - The patient agreed with the plan and demonstrated an understanding of the instructions.   No barriers to understanding were identified.     - The patient was advised to call back or seek an in-person evaluation if the symptoms worsen or if the condition fails to improve as anticipated.   Return in about 2 weeks (around 12/21/2020) for HTN- added med.    No orders of the defined types were placed in this encounter.   Meds ordered this encounter  Medications  . tretinoin (RETIN-A) 0.025 % cream    Sig: Apply topically at bedtime.    Dispense:  45 g    Refill:  0    Order Specific Question:   Supervising Provider    Answer:   Beatrice Lecher D [[id number]]  . sertraline (ZOLOFT) 50 MG tablet    Sig: Take 1 tablet (50 mg total) by mouth daily.     Dispense:  90 tablet    Refill:  0    Order Specific Question:   Supervising Provider    Answer:   Beatrice Lecher D [[id number]]  . amLODipine (NORVASC) 5 MG tablet    Sig: Take 1 tablet (5 mg total) by mouth daily.    Dispense:  30 tablet    Refill:  0    Order Specific Question:   Supervising Provider    Answer:   Beatrice Lecher D [[id number]]    Medications Discontinued During This Encounter  Medication Reason  . sertraline (ZOLOFT) 25 MG tablet Dose change       Time spent on telephone encounter was 20 minutes.      The [place] was signed into law in 2016 which includes the topic of electronic health records.  This provides immediate access to information in MyChart.  This includes  consultation notes, operative notes, office notes, lab results and pathology reports.  If you have any questions about what you read please let us know at your next visit or call us at the office.  We are right here with you.   __________________________________________________________________________________     Patient Care Team    Relationship Specialty Notifications Start End  George [name], [place] PCP - General   12/11/19   Lorretta Harp, MD Consulting Physician Cardiology  04/05/16   Estanislado Emms, MD (Inactive) Consulting Physician Nephrology  04/05/16   Evans Lance, MD Consulting Physician Clinical Cardiac Electrophysiology  04/05/16    Comment: his  EP  [facility]  Comer, Okey Regal, MD Consulting Physician Infectious Diseases  04/05/16    Comment: Hep C txmnt  Sharmon Revere Physician Assistant Cardiology  04/05/16    Comment: follows pt's fluid status---> L Ext edema  Dillingham, Loel Lofty, DO Attending Physician Plastic Surgery  04/05/16    Comment: Dr. Theodoro Kos   ( plastics and reconstuctive sx at [facility]) sees her in [place] [facility];  she is who has diagnosed him with Berger's disease     -Vitals obtained; medications/ allergies reconciled;   personal medical, social, Sx etc.histories were updated by CMA, reviewed by me and are reflected in chart   Patient Active Problem List   Diagnosis Date Noted  . Essential hypertension 12/23/2019  . Altered mental status 12/22/2019  . H/O total hip arthroplasty, left 11/15/2019  . Status post total replacement of left hip 11/14/2019  . Preoperative clearance 10/19/2019  . Osteoarthritis of left hip 09/15/2019  . Vaccination declined by pt 08/25/2019  . Adjustment reaction with anxiety and depression 06/09/2019  . Reactive airway disease without complication 26/83/4196  . Environmental and seasonal allergies 02/09/2019  . Abnormal skin growth- R inner nasal lacrimal fold 05/18/2018  . Excessive use of nonsteroidal anti-inflammatory drug (NSAID) 01/17/2018  . Chronic Right leg pain-  pt w/ history of ORIF femur 30-40 years ago increasing pain for 1 year, terrible pain 3 months 01/17/2018  . Insomnia due to anxiety and fear 09/15/2017  . Positive for macroalbuminuria 05/15/2017  . HLD (hyperlipidemia) 05/15/2017  . Low level of high density lipoprotein (HDL) 05/15/2017  . Hypertriglyceridemia 05/15/2017  . Obesity, Class I, BMI 30-34.9 04/29/2017  . Elevated blood pressure reading- situational 04/29/2017  . History of elevated glucose 04/29/2017  . Colonoscopy refused 04/29/2017  . Vitamin D deficiency 05/19/2016  . 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 Alcoholism /alcohol abuse- episodic binges 04/05/2016  . 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 Insomnia 04/05/2016  . Noncompliance with diet and medication regimen 04/05/2016  . Liver fibrosis 03/24/2016  . Signficant h/o tobacco abuse (150pk/yr hx) 03/03/2016  . h/o Basal cell carcinoma of nose 03/03/2016  . GAD (generalized anxiety disorder) 03/03/2016  . h/o Hepatitis C infection 11/14/2015  . Berger's disease: IgA Nephropathy  10/12/2015  . COPD mixed type ([place]) 10/12/2015  . Aneurysm of  infrarenal abdominal aorta ([place]) 09/24/2015  . h/o Hypothyroidism 09/22/2015  . Venous insufficiency of both lower extremities 09/21/2015  . H/O CHF 08/20/2015  . h/o SVT (supraventricular tachycardia) s/p Ablation 05/22/2015     Current Meds  Medication Sig  . amLODipine (NORVASC) 5 MG tablet Take 1 tablet (5 mg total) by mouth daily.  Marland Kitchen aspirin EC 81 MG tablet Take 1 tablet (81 mg total) by mouth 2 (two) times daily.  Marland Kitchen  desonide (DESOWEN) 0.05 % cream Apply topically 2 (two) times daily.  Marland Kitchen losartan (COZAAR) 50 MG tablet Take 1 tablet (50 mg total) by mouth daily.  . sertraline (ZOLOFT) 50 MG tablet Take 1 tablet (50 mg total) by mouth daily.  Marland Kitchen tretinoin (RETIN-A) 0.025 % cream Apply topically at bedtime.  . [DISCONTINUED] sertraline (ZOLOFT) 25 MG tablet TAKE 1 TABLET BY MOUTH DAILY.     Allergies:  No Known Allergies   ROS:  See above HPI for pertinent positives and negatives   Objective:   There were no vitals taken for this visit.  (if some vitals are omitted, this means that patient was UNABLE to obtain them.) General: A & O * 3; sounds in no acute distress Respiratory: speaking in full sentences, no conversational dyspnea Psych: insight appears good, mood- appears full

## 2020-12-21 ENCOUNTER — Other Ambulatory Visit: Payer: Self-pay | Admitting: Family Medicine

## 2020-12-21 DIAGNOSIS — J3089 Other allergic rhinitis: Secondary | ICD-10-CM

## 2020-12-21 DIAGNOSIS — J449 Chronic obstructive pulmonary disease, unspecified: Secondary | ICD-10-CM

## 2020-12-21 DIAGNOSIS — J45909 Unspecified asthma, uncomplicated: Secondary | ICD-10-CM

## 2020-12-24 ENCOUNTER — Other Ambulatory Visit: Payer: Self-pay | Admitting: Physician Assistant

## 2020-12-24 DIAGNOSIS — J449 Chronic obstructive pulmonary disease, unspecified: Secondary | ICD-10-CM

## 2020-12-24 DIAGNOSIS — J3089 Other allergic rhinitis: Secondary | ICD-10-CM

## 2020-12-24 DIAGNOSIS — J45909 Unspecified asthma, uncomplicated: Secondary | ICD-10-CM

## 2021-01-08 ENCOUNTER — Other Ambulatory Visit: Payer: Self-pay | Admitting: Physician Assistant

## 2021-01-08 DIAGNOSIS — I151 Hypertension secondary to other renal disorders: Secondary | ICD-10-CM

## 2021-01-09 ENCOUNTER — Other Ambulatory Visit: Payer: Self-pay | Admitting: Physician Assistant

## 2021-01-09 DIAGNOSIS — I151 Hypertension secondary to other renal disorders: Secondary | ICD-10-CM

## 2021-01-09 DIAGNOSIS — N049 Nephrotic syndrome with unspecified morphologic changes: Secondary | ICD-10-CM

## 2021-02-08 ENCOUNTER — Other Ambulatory Visit: Payer: Self-pay | Admitting: Physician Assistant

## 2021-02-08 DIAGNOSIS — I151 Hypertension secondary to other renal disorders: Secondary | ICD-10-CM

## 2021-03-06 ENCOUNTER — Other Ambulatory Visit: Payer: Self-pay | Admitting: Physician Assistant

## 2021-03-06 DIAGNOSIS — F411 Generalized anxiety disorder: Secondary | ICD-10-CM

## 2021-03-12 ENCOUNTER — Other Ambulatory Visit: Payer: Self-pay | Admitting: Physician Assistant

## 2021-03-12 DIAGNOSIS — I151 Hypertension secondary to other renal disorders: Secondary | ICD-10-CM

## 2021-03-12 NOTE — Telephone Encounter (Signed)
Ben,   Please reach out to the patient to schedule a follow up per last AVS.   Respectfully,  Gwyndolyn Saxon

## 2021-03-27 ENCOUNTER — Ambulatory Visit (INDEPENDENT_AMBULATORY_CARE_PROVIDER_SITE_OTHER): Payer: Medicare Other | Admitting: Physician Assistant

## 2021-03-27 ENCOUNTER — Other Ambulatory Visit: Payer: Self-pay

## 2021-03-27 ENCOUNTER — Encounter: Payer: Self-pay | Admitting: Physician Assistant

## 2021-03-27 VITALS — BP 117/74 | HR 102 | Temp 98.2°F | Ht 74.0 in | Wt 255.2 lb

## 2021-03-27 DIAGNOSIS — F411 Generalized anxiety disorder: Secondary | ICD-10-CM

## 2021-03-27 DIAGNOSIS — J449 Chronic obstructive pulmonary disease, unspecified: Secondary | ICD-10-CM

## 2021-03-27 DIAGNOSIS — I151 Hypertension secondary to other renal disorders: Secondary | ICD-10-CM

## 2021-03-27 DIAGNOSIS — N049 Nephrotic syndrome with unspecified morphologic changes: Secondary | ICD-10-CM | POA: Diagnosis not present

## 2021-03-27 MED ORDER — SERTRALINE HCL 25 MG PO TABS: 75.0000 mg | ORAL_TABLET | Freq: Every day | ORAL | 0 refills | Status: DC

## 2021-03-27 NOTE — Progress Notes (Signed)
Established Patient Office Visit  Subjective:  Patient ID: George [name], male    DOB: April 16, 1951  Age: 70 y.o. MRN: [id number]  CC:  Chief Complaint  Patient presents with   Hypertension    HPI George [name] presents for follow up on hypertension.  HTN: Pt denies chest pain, palpitations, dizziness or lower extremity swelling. Taking medication as directed without side effects. Checks BP at home and readings range 130s/75-80. States since starting amlodipine in conjunction with his Losartan his blood pressure has been much better.  Mood: Patient reports has not noticed a difference with increased dose of sertraline. Patient lives alone and will constantly be over-thinking and get worried about things. Denies SI/HI.  COPD: Patient states his breathing is about the same. Denies worsening dyspnea. Patient stopped smoking 4-5 years ago. Denies current use of cigarettes or vape.    Past Medical History:  Diagnosis Date   AAA (abdominal aortic aneurysm) ([place])    a. [place] 09/2015 - 3cm distal AAA. //  b. [place] 5/17: 3.1 cm    Basal cell carcinoma of right side of nose    Berger's disease    CKD (chronic kidney disease) 09/2015   COPD (chronic obstructive pulmonary disease) ([facility])    GERD (gastroesophageal reflux disease)    Nephrotic syndrome    Panic attacks    Sleep apnea    SVT (supraventricular tachycardia) ([place]) 05/22/2015    Past Surgical History:  Procedure Laterality Date   BASAL CELL CARCINOMA EXCISION Right ~ 06/2015   "side of my nose"   ELECTROPHYSIOLOGIC STUDY N/A 09/10/2015   Procedure: SVT Ablation;  Surgeon: Evans Lance, MD;  Location: [place] [facility];  Service: Cardiovascular;  Laterality: N/A;   FEMUR FRACTURE SURGERY Right 1970   "had plate & pin put in"   [place] Right 1971   "removed plate; left pin in   FRACTURE SURGERY     KNEE ARTHROSCOPY Right ~ [place]  ~ [place] Left 11/14/2019    Procedure: LEFT TOTAL HIP ARTHROPLASTY ANTERIOR APPROACH;  Surgeon: Frederik Pear, MD;  Location: [facility];  Service: Orthopedics;  Laterality: Left;    Family History  Problem Relation Age of Onset   Heart disease Mother        Before age 7   Heart attack Mother    Cancer Mother        lung and colon   Stroke Mother    Heart failure Father    Heart disease Father        After age 71   Heart attack Father    Hyperlipidemia Father    Healthy Maternal Grandmother    Healthy Maternal Grandfather    Diabetes Paternal Grandmother    Healthy Paternal Grandfather    Hypertension Neg Hx     Social History   Socioeconomic History   Marital status: Divorced    Spouse name: Not on file   Number of children: 0   Years of education: 12   Highest education level: Not on file  Occupational History   Occupation: Retired  Tobacco Use   Smoking status: Former    Packs/day: 2.00    Years: 50.00    Pack years: 100.00    Types: Cigarettes    Quit date: 01/24/2015    Years since quitting: 6.1   Smokeless tobacco: Never  Vaping Use   Vaping Use: Never used  Substance and  Sexual Activity   Alcohol use: Not Currently    Alcohol/week: 3.0 - 4.0 standard drinks    Types: 3 - 4 Shots of liquor per week   Drug use: No   Sexual activity: Not Currently  Other Topics Concern   Not on file  Social History Narrative   Fun: Trade out work, outdoors   Denies religious beliefs effecting health care.    Social Determinants of Health   Financial Resource Strain: Not on file  Food Insecurity: Not on file  Transportation Needs: Not on file  Physical Activity: Not on file  Stress: Not on file  Social Connections: Not on file  Intimate Partner Violence: Not on file    Outpatient Medications Prior to Visit  Medication Sig Dispense Refill   amLODipine (NORVASC) 5 MG tablet TAKE 1 TABLET BY MOUTH DAILY. 30 tablet 0   aspirin EC 81 MG tablet Take 1 tablet (81 mg total) by mouth 2 (two) times  daily. 60 tablet 0   desonide (DESOWEN) 0.05 % cream Apply topically 2 (two) times daily. 30 g 0   losartan (COZAAR) 50 MG tablet TAKE 1 TABLET (50 MG TOTAL) BY MOUTH DAILY. 90 tablet 0   tretinoin (RETIN-A) 0.025 % cream Apply topically at bedtime. 45 g 0   sertraline (ZOLOFT) 50 MG tablet TAKE 1 TABLET (50 MG TOTAL) BY MOUTH DAILY. 90 tablet 0   No facility-administered medications prior to visit.    No Known Allergies  ROS Review of Systems Review of Systems:  A fourteen system review of systems was performed and found to be positive as per HPI.   Objective:    Physical Exam General:  Pleasant and cooperative, in no acute distress Neuro:  Alert and oriented,  extra-ocular muscles intact  HEENT:  Normocephalic, atraumatic, neck supple Skin:  no gross rash, warm, pink. Cardiac:  RRR, S1 S2 Respiratory:  Inspiratory and expiratory wheezing with scattered rhonchi noted, Not using accessory muscles, speaking in full sentences- unlabored. Vascular:  Ext warm, no cyanosis apprec.; cap RF less 2 sec.  Psych:  No HI/SI, judgement and insight good, Euthymic mood. Full Affect.  BP 117/74   Pulse (!) 102   Temp 98.2 F (36.8 C)   Ht 6' 2"  (1.88 m)   Wt 255 lb 3.2 oz (115.8 kg)   SpO2 93%   BMI 32.77 kg/m  Wt Readings from Last 3 Encounters:  03/27/21 255 lb 3.2 oz (115.8 kg)  09/07/20 255 lb 12.8 oz (116 kg)  02/17/20 235 lb 1.6 oz (106.6 kg)     Health Maintenance Due  Topic Date Due   COVID-19 Vaccine (1) Never done   Zoster Vaccines- Shingrix (1 of 2) Never done   COLONOSCOPY (Pts 45-84yr Insurance coverage will need to be confirmed)  Never done   INFLUENZA VACCINE  03/11/2021    There are no preventive care reminders to display for this patient.  Lab Results  Component Value Date   TSH 2.920 08/25/2019   Lab Results  Component Value Date   WBC 6.4 12/23/2019   HGB 11.7 (L) 12/23/2019   HCT 36.6 (L) 12/23/2019   MCV 95.1 12/23/2019   PLT 236 12/23/2019    Lab Results  Component Value Date   NA 137 02/17/2020   K 5.4 (H) 02/17/2020   CO2 20 02/17/2020   GLUCOSE 94 02/17/2020   BUN 23 02/17/2020   CREATININE 1.27 02/17/2020   BILITOT 0.3 02/17/2020   ALKPHOS 74 02/17/2020  AST 17 02/17/2020   ALT 13 02/17/2020   PROT 7.1 02/17/2020   ALBUMIN 4.6 02/17/2020   CALCIUM 9.3 02/17/2020   ANIONGAP 9 12/23/2019   GFR 61.36 08/29/2015   GFR 61.36 08/29/2015   Lab Results  Component Value Date   CHOL 183 02/17/2020   Lab Results  Component Value Date   HDL 53 02/17/2020   Lab Results  Component Value Date   LDLCALC 111 (H) 02/17/2020   Lab Results  Component Value Date   TRIG 107 02/17/2020   Lab Results  Component Value Date   CHOLHDL 3.5 02/17/2020   Lab Results  Component Value Date   HGBA1C 5.2 08/25/2019      Assessment & Plan:   Problem List Items Addressed This Visit       Cardiovascular and Mediastinum   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 Continue current medication regimen. -Will collect CMP for medication monitoring.      Relevant Orders   Comp Met (CMET)     Respiratory   COPD mixed type ([place]) - Primary (Chronic)    -Patient continues to decline treatment therapy. -Recommend to continue to avoid tobacco use. -Will continue to monitor.        Other   GAD (generalized anxiety disorder) (Chronic)    -GAD-7 score of 0, inconsistent with patient's hx. Will increase sertraline to 75 mg. Advised patient to let me know if unable to tolerate increased dose. Will continue to monitor.      Relevant Medications   sertraline (ZOLOFT) 25 MG tablet    Meds ordered this encounter  Medications   sertraline (ZOLOFT) 25 MG tablet    Sig: Take 3 tablets (75 mg total) by mouth daily.    Dispense:  270 tablet    Refill:  0    Follow-up: Return in about 3 months (around 06/27/2021) for [place] with [facility] few days before.    Lorrene Reid, PA-C

## 2021-03-27 NOTE — Assessment & Plan Note (Signed)
-  GAD-7 score of 0, inconsistent with patient's hx. Will increase sertraline to 75 mg. Advised patient to let me know if unable to tolerate increased dose. Will continue to monitor.

## 2021-03-27 NOTE — Assessment & Plan Note (Signed)
-  Controlled. Continue current medication regimen. -Will collect CMP for medication monitoring.

## 2021-03-27 NOTE — Assessment & Plan Note (Addendum)
-  Patient continues to decline treatment therapy. -Recommend to continue to avoid tobacco use. -Will continue to monitor.

## 2021-03-28 LAB — COMPREHENSIVE METABOLIC PANEL
ALT: 11 IU/L (ref 0–44)
AST: 15 IU/L (ref 0–40)
Albumin/Globulin Ratio: 2 (ref 1.2–2.2)
Albumin: 4.8 g/dL — ABNORMAL HIGH (ref 3.7–4.7)
Alkaline Phosphatase: 79 IU/L (ref 44–121)
BUN/Creatinine Ratio: 13 (ref 10–24)
BUN: 14 mg/dL (ref 8–27)
Bilirubin Total: 0.3 mg/dL (ref 0.0–1.2)
CO2: 24 mmol/L (ref 20–29)
Calcium: 9.5 mg/dL (ref 8.6–10.2)
Chloride: 101 mmol/L (ref 96–106)
Creatinine, Ser: 1.07 mg/dL (ref 0.76–1.27)
Globulin, Total: 2.4 g/dL (ref 1.5–4.5)
Glucose: 95 mg/dL (ref 65–99)
Potassium: 4.7 mmol/L (ref 3.5–5.2)
Sodium: 140 mmol/L (ref 134–144)
Total Protein: 7.2 g/dL (ref 6.0–8.5)
eGFR: 74 mL/min/{1.73_m2} (ref >59)

## 2021-03-29 ENCOUNTER — Telehealth: Payer: Self-pay | Admitting: Physician Assistant

## 2021-03-29 DIAGNOSIS — N049 Nephrotic syndrome with unspecified morphologic changes: Secondary | ICD-10-CM

## 2021-03-29 DIAGNOSIS — I151 Hypertension secondary to other renal disorders: Secondary | ICD-10-CM

## 2021-03-29 MED ORDER — LOSARTAN POTASSIUM 50 MG PO TABS: 50.0000 mg | ORAL_TABLET | Freq: Every day | ORAL | 0 refills | Status: DC

## 2021-03-29 NOTE — Telephone Encounter (Signed)
Refill sent to pharmacy. AS, CMA 

## 2021-03-29 NOTE — Addendum Note (Signed)
Addended by: Mickel Crow on: 03/29/2021 11:20 AM   Modules accepted: Orders

## 2021-03-29 NOTE — Telephone Encounter (Signed)
Patient uses [facility] and needs refills on Losartan and would like to make sure the other medication were filled as well, thanks.

## 2021-04-16 ENCOUNTER — Other Ambulatory Visit: Payer: Self-pay | Admitting: Physician Assistant

## 2021-04-16 DIAGNOSIS — I151 Hypertension secondary to other renal disorders: Secondary | ICD-10-CM

## 2021-05-27 ENCOUNTER — Other Ambulatory Visit: Payer: Self-pay | Admitting: Physician Assistant

## 2021-05-27 DIAGNOSIS — I151 Hypertension secondary to other renal disorders: Secondary | ICD-10-CM

## 2021-06-20 ENCOUNTER — Other Ambulatory Visit: Payer: Self-pay | Admitting: Physician Assistant

## 2021-06-20 DIAGNOSIS — I151 Hypertension secondary to other renal disorders: Secondary | ICD-10-CM

## 2021-06-20 DIAGNOSIS — N049 Nephrotic syndrome with unspecified morphologic changes: Secondary | ICD-10-CM

## 2021-06-25 ENCOUNTER — Other Ambulatory Visit: Payer: Self-pay

## 2021-06-25 DIAGNOSIS — Z8639 Personal history of other endocrine, nutritional and metabolic disease: Secondary | ICD-10-CM

## 2021-06-25 DIAGNOSIS — E785 Hyperlipidemia, unspecified: Secondary | ICD-10-CM

## 2021-06-25 DIAGNOSIS — Z1321 Encounter for screening for nutritional disorder: Secondary | ICD-10-CM

## 2021-06-25 DIAGNOSIS — Z130 Encounter for screening for diseases of the blood and blood-forming organs and certain disorders involving the immune mechanism: Secondary | ICD-10-CM

## 2021-06-25 DIAGNOSIS — I10 Essential (primary) hypertension: Secondary | ICD-10-CM

## 2021-06-26 ENCOUNTER — Other Ambulatory Visit: Payer: Medicare Other

## 2021-06-26 ENCOUNTER — Other Ambulatory Visit: Payer: Self-pay

## 2021-06-26 DIAGNOSIS — Z13228 Encounter for screening for other metabolic disorders: Secondary | ICD-10-CM | POA: Diagnosis not present

## 2021-06-26 DIAGNOSIS — Z130 Encounter for screening for diseases of the blood and blood-forming organs and certain disorders involving the immune mechanism: Secondary | ICD-10-CM | POA: Diagnosis not present

## 2021-06-26 DIAGNOSIS — Z1329 Encounter for screening for other suspected endocrine disorder: Secondary | ICD-10-CM | POA: Diagnosis not present

## 2021-06-26 DIAGNOSIS — Z1321 Encounter for screening for nutritional disorder: Secondary | ICD-10-CM

## 2021-06-26 DIAGNOSIS — I10 Essential (primary) hypertension: Secondary | ICD-10-CM | POA: Diagnosis not present

## 2021-06-26 DIAGNOSIS — E785 Hyperlipidemia, unspecified: Secondary | ICD-10-CM

## 2021-06-26 DIAGNOSIS — Z8639 Personal history of other endocrine, nutritional and metabolic disease: Secondary | ICD-10-CM | POA: Diagnosis not present

## 2021-06-27 LAB — CBC WITH DIFFERENTIAL/PLATELET
Basophils Absolute: 0.1 10*3/uL (ref 0.0–0.2)
Basos: 1 %
EOS (ABSOLUTE): 0.1 10*3/uL (ref 0.0–0.4)
Eos: 2 %
Hematocrit: 49.9 % (ref 37.5–51.0)
Hemoglobin: 16.7 g/dL (ref 13.0–17.7)
Immature Grans (Abs): 0.1 10*3/uL (ref 0.0–0.1)
Immature Granulocytes: 1 %
Lymphocytes Absolute: 1.1 10*3/uL (ref 0.7–3.1)
Lymphs: 18 %
MCH: 28.5 pg (ref 26.6–33.0)
MCHC: 33.5 g/dL (ref 31.5–35.7)
MCV: 85 fL (ref 79–97)
Monocytes Absolute: 0.5 10*3/uL (ref 0.1–0.9)
Monocytes: 9 %
Neutrophils Absolute: 4.1 10*3/uL (ref 1.4–7.0)
Neutrophils: 69 %
Platelets: 246 10*3/uL (ref 150–450)
RBC: 5.85 x10E6/uL — ABNORMAL HIGH (ref 4.14–5.80)
RDW: 14.9 % (ref 11.6–15.4)
WBC: 6 10*3/uL (ref 3.4–10.8)

## 2021-06-27 LAB — LIPID PANEL
Chol/HDL Ratio: 4 ratio (ref 0.0–5.0)
Cholesterol, Total: 149 mg/dL (ref 100–199)
HDL: 37 mg/dL — ABNORMAL LOW (ref >39)
LDL Chol Calc (NIH): 95 mg/dL (ref 0–99)
Triglycerides: 88 mg/dL (ref 0–149)
VLDL Cholesterol Cal: 17 mg/dL (ref 5–40)

## 2021-06-27 LAB — COMPREHENSIVE METABOLIC PANEL
ALT: 13 IU/L (ref 0–44)
AST: 16 IU/L (ref 0–40)
Albumin/Globulin Ratio: 1.9 (ref 1.2–2.2)
Albumin: 4.5 g/dL (ref 3.7–4.7)
Alkaline Phosphatase: 89 IU/L (ref 44–121)
BUN/Creatinine Ratio: 13 (ref 10–24)
BUN: 15 mg/dL (ref 8–27)
Bilirubin Total: 0.3 mg/dL (ref 0.0–1.2)
CO2: 24 mmol/L (ref 20–29)
Calcium: 9.2 mg/dL (ref 8.6–10.2)
Chloride: 97 mmol/L (ref 96–106)
Creatinine, Ser: 1.2 mg/dL (ref 0.76–1.27)
Globulin, Total: 2.4 g/dL (ref 1.5–4.5)
Glucose: 98 mg/dL (ref 70–99)
Potassium: 4.9 mmol/L (ref 3.5–5.2)
Sodium: 136 mmol/L (ref 134–144)
Total Protein: 6.9 g/dL (ref 6.0–8.5)
eGFR: 64 mL/min/{1.73_m2} (ref >59)

## 2021-06-27 LAB — TSH: TSH: 3.3 u[IU]/mL (ref 0.450–4.500)

## 2021-06-27 LAB — HEMOGLOBIN A1C
Est. average glucose Bld gHb Est-mCnc: 111 mg/dL
Hgb A1c MFr Bld: 5.5 % (ref 4.8–5.6)

## 2021-07-01 ENCOUNTER — Other Ambulatory Visit: Payer: Self-pay

## 2021-07-01 ENCOUNTER — Encounter: Payer: Self-pay | Admitting: Physician Assistant

## 2021-07-01 ENCOUNTER — Ambulatory Visit (INDEPENDENT_AMBULATORY_CARE_PROVIDER_SITE_OTHER): Payer: Medicare Other | Admitting: Physician Assistant

## 2021-07-01 VITALS — BP 123/81 | HR 84 | Temp 98.0°F | Ht 74.0 in | Wt 251.0 lb

## 2021-07-01 DIAGNOSIS — I151 Hypertension secondary to other renal disorders: Secondary | ICD-10-CM

## 2021-07-01 DIAGNOSIS — F411 Generalized anxiety disorder: Secondary | ICD-10-CM

## 2021-07-01 DIAGNOSIS — F4323 Adjustment disorder with mixed anxiety and depressed mood: Secondary | ICD-10-CM

## 2021-07-01 DIAGNOSIS — Z0000 Encounter for general adult medical examination without abnormal findings: Secondary | ICD-10-CM

## 2021-07-01 MED ORDER — AMLODIPINE BESYLATE 5 MG PO TABS: 5.0000 mg | ORAL_TABLET | Freq: Every day | ORAL | 1 refills | Status: DC

## 2021-07-01 MED ORDER — ESCITALOPRAM OXALATE 10 MG PO TABS: 5.0000 mg | ORAL_TABLET | Freq: Every day | ORAL | 0 refills | Status: DC

## 2021-07-01 NOTE — Progress Notes (Signed)
Subjective:   George [name] is a 70 y.o. male who presents for Medicare Annual/Subsequent preventive examination.  Review of Systems    General:   No F/C, wt loss Pulm:   No pleuritic chest pain, +SOB Card:  No CP, palpitations Abd:  No n/v/d or pain Ext:  No inc edema from baseline    Objective:    Today's Vitals   07/01/21 1054  BP: 123/81  Pulse: 84  Temp: 98 F (36.7 C)  SpO2: 95%  Weight: 251 lb (113.9 kg)  Height: 6\' 2"  (1.88 m)   Body mass index is 32.23 kg/m.  Advanced Directives 12/23/2019 12/22/2019 11/17/2019 11/14/2019 11/08/2019 04/29/2017 03/03/2016  Does Patient Have a Medical Advance Directive? Yes No Yes Yes Yes Yes Yes  Type of Paramedic of [place];Living will - [place];Living will [place];Living will Living will;Healthcare Power of [place];Living will [place];Living will  Does patient want to make changes to medical advance directive? No - Patient declined - No - Patient declined - - - -  Copy of [place] in Chart? No - copy requested - - - - - -  Would patient like information on creating a medical advance directive? - - - - - - -    Current Medications (verified) Outpatient Encounter Medications as of 07/01/2021  Medication Sig   aspirin EC 81 MG tablet Take 1 tablet (81 mg total) by mouth 2 (two) times daily.   desonide (DESOWEN) 0.05 % cream Apply topically 2 (two) times daily.   escitalopram (LEXAPRO) 10 MG tablet Take 0.5 tablets (5 mg total) by mouth at bedtime.   losartan (COZAAR) 50 MG tablet TAKE 1 TABLET (50 MG TOTAL) BY MOUTH DAILY.   tretinoin (RETIN-A) 0.025 % cream Apply topically at bedtime.   [DISCONTINUED] amLODipine (NORVASC) 5 MG tablet TAKE 1 TABLET BY MOUTH DAILY.   [DISCONTINUED] sertraline (ZOLOFT) 25 MG tablet Take 3 tablets (75 mg total) by mouth daily.   amLODipine (NORVASC) 5 MG tablet Take 1  tablet (5 mg total) by mouth daily.   No facility-administered encounter medications on file as of 07/01/2021.    Allergies (verified) Patient has no known allergies.   History: Past Medical History:  Diagnosis Date   AAA (abdominal aortic aneurysm)    a. [place] 09/2015 - 3cm distal AAA. //  b. [place] 5/17: 3.1 cm    Basal cell carcinoma of right side of nose    Berger's disease    CKD (chronic kidney disease) 09/2015   COPD (chronic obstructive pulmonary disease) (HCC)    GERD (gastroesophageal reflux disease)    Nephrotic syndrome    Panic attacks    Sleep apnea    SVT (supraventricular tachycardia) ([place]) 05/22/2015   Past Surgical History:  Procedure Laterality Date   BASAL CELL CARCINOMA EXCISION Right ~ 06/2015   "side of my nose"   ELECTROPHYSIOLOGIC STUDY N/A 09/10/2015   Procedure: SVT Ablation;  Surgeon: Evans Lance, MD;  Location: [facility];  Service: Cardiovascular;  Laterality: N/A;   FEMUR FRACTURE SURGERY Right 1970   "had plate & pin put in"   FEMUR HARDWARE REMOVAL Right 1971   "removed plate; left pin in   FRACTURE SURGERY     KNEE ARTHROSCOPY Right ~ [place]  ~ [place] Left 11/14/2019   Procedure: LEFT TOTAL HIP ARTHROPLASTY  ANTERIOR APPROACH;  Surgeon: Frederik Pear, MD;  Location: [facility];  Service: Orthopedics;  Laterality: Left;   Family History  Problem Relation Age of Onset   Heart disease Mother        Before age 20   Heart attack Mother    Cancer Mother        lung and colon   Stroke Mother    Heart failure Father    Heart disease Father        After age 36   Heart attack Father    Hyperlipidemia Father    Healthy Maternal Grandmother    Healthy Maternal Grandfather    Diabetes Paternal Grandmother    Healthy Paternal Grandfather    Hypertension Neg Hx    Social History   Socioeconomic History   Marital status: Divorced    Spouse name: Not on file   Number of children: 0   Years  of education: 12   Highest education level: Not on file  Occupational History   Occupation: Retired  Tobacco Use   Smoking status: Former    Packs/day: 2.00    Years: 50.00    Pack years: 100.00    Types: Cigarettes    Quit date: 01/24/2015    Years since quitting: 6.4   Smokeless tobacco: Never  Vaping Use   Vaping Use: Never used  Substance and Sexual Activity   Alcohol use: Not Currently    Alcohol/week: 3.0 - 4.0 standard drinks    Types: 3 - 4 Shots of liquor per week   Drug use: No   Sexual activity: Not Currently  Other Topics Concern   Not on file  Social History Narrative   Fun: Trade out work, outdoors   Denies religious beliefs effecting health care.    Social Determinants of Health   Financial Resource Strain: Not on file  Food Insecurity: Not on file  Transportation Needs: Not on file  Physical Activity: Not on file  Stress: Not on file  Social Connections: Not on file    Tobacco Counseling Counseling given: Not Answered    Diabetic?no   Activities of Daily Living In your present state of health, do you have any difficulty performing the following activities: 07/01/2021 03/27/2021  Hearing? N N  Vision? N N  Difficulty concentrating or making decisions? N N  Walking or climbing stairs? N Y  Dressing or bathing? N N  Doing errands, shopping? N N  Some recent data might be hidden    Patient Care Team: Lorrene Reid, PA-C as PCP - General [name] Pearletha Forge, MD as Consulting Physician (Cardiology) Estanislado Emms, MD (Inactive) as Consulting Physician (Nephrology) Evans Lance, MD as Consulting Physician (Clinical Cardiac Electrophysiology) Comer, Okey Regal, MD as Consulting Physician (Infectious Diseases) Sharmon Revere as Physician Assistant (Cardiology) Dillingham, Loel Lofty, DO as Attending Physician (Plastic Surgery)  Indicate any recent Medical Services you may have received from other than [facility] providers in the past year (date  may be approximate).     Assessment:   This is a routine wellness examination for George [name].  Hearing/Vision screen No results found.  Dietary issues and exercise activities discussed: -Trying to eat more vegetables. Discussed heart healthy diet. Continue to stay as active as tolerated.   Goals Addressed   None   Depression Screen PHQ 2/9 Scores 07/01/2021 03/27/2021 12/07/2020 09/07/2020 02/17/2020 09/16/2019 08/30/2019  PHQ - 2 Score 1 0 0 4 0 0 0  PHQ- 9 Score 2 0  1 10 1  0 0    Fall Risk Fall Risk  07/01/2021 03/27/2021 12/07/2020 09/07/2020 02/17/2020  Falls in the past year? 0 1 0 0 0  Number falls in past yr: 0 0 0 - -  Injury with Fall? 0 1 0 - -  Risk Factor Category  - - - - -  Risk for fall due to : No Fall Risks History of fall(s) - - -  Follow up Falls evaluation completed Falls evaluation completed Falls evaluation completed Falls evaluation completed Falls evaluation completed    [facility]:  Any stairs in or around the home? No  If so, are there any without handrails? No  Home free of loose throw rugs in walkways, pet beds, electrical cords, etc? Yes  Adequate lighting in your home to reduce risk of falls? Yes   ASSISTIVE DEVICES UTILIZED TO PREVENT FALLS:  Life alert? No  Use of a cane, walker or w/c? No  Grab bars in the bathroom? Yes  Shower chair or bench in shower? Yes  Elevated toilet seat or a handicapped toilet? Yes   TIMED UP AND GO:  Was the test performed? Yes .  Length of time to ambulate 10 feet: 14 sec.   Gait slow and steady without use of assistive device  Cognitive Function: MMSE - Mini Mental State Exam 01/31/2015  Orientation to time 5  Orientation to Place 5  Registration 3  Attention/ Calculation 5  Recall 3  Language- name 2 objects 2  Language- repeat 1  Language- follow 3 step command 3  Language- read & follow direction 1  Write a sentence 1  Copy design 1  Total score 30     6CIT Screen  07/01/2021 02/17/2020 08/25/2019 04/29/2017  What Year? 0 points 0 points 0 points 0 points  What month? 0 points 0 points 0 points 0 points  What time? 0 points 0 points 0 points 0 points  Count back from 20 0 points 0 points 0 points 0 points  Months in reverse 0 points 0 points 0 points 0 points  Repeat phrase 0 points 0 points 4 points 0 points  Total Score 0 0 4 0    Immunizations Immunization History  Administered Date(s) Administered   Hep A / Hep B 04/29/2017   Pneumococcal Conjugate-13 04/29/2017   Pneumococcal Polysaccharide-23 08/23/2015   Tdap 01/31/2015    TDAP status: Up to date  Flu Vaccine status: Declined, Education has been provided regarding the importance of this vaccine but patient still declined. Advised may receive this vaccine at local pharmacy or Health Dept. Aware to provide a copy of the vaccination record if obtained from local pharmacy or Health Dept. Verbalized acceptance and understanding.  Pneumococcal vaccine status: Up to date  Covid-19 vaccine status: Declined, Education has been provided regarding the importance of this vaccine but patient still declined. Advised may receive this vaccine at local pharmacy or Health Dept.or vaccine clinic. Aware to provide a copy of the vaccination record if obtained from local pharmacy or Health Dept. Verbalized acceptance and understanding.  Qualifies for Shingles Vaccine? Yes   Zostavax completed No   Shingrix Completed?: No.    Education has been provided regarding the importance of this vaccine. Patient has been advised to call insurance company to determine out of pocket expense if they have not yet received this vaccine. Advised may also receive vaccine at local pharmacy or Health Dept. Verbalized acceptance and understanding.  Screening  Tests Health Maintenance  Topic Date Due   COVID-19 Vaccine (1) Never done   Zoster Vaccines- Shingrix (1 of 2) Never done   COLONOSCOPY (Pts 45-38yrs Insurance coverage will  need to be confirmed)  Never done   INFLUENZA VACCINE  Never done   TETANUS/TDAP  01/30/2025   Pneumonia Vaccine 86+ Years old  Completed   Hepatitis C Screening  Completed   HPV VACCINES  Aged Out    Health Maintenance  Health Maintenance Due  Topic Date Due   COVID-19 Vaccine (1) Never done   Zoster Vaccines- Shingrix (1 of 2) Never done   COLONOSCOPY (Pts 45-67yrs Insurance coverage will need to be confirmed)  Never done   INFLUENZA VACCINE  Never done    Patient declined any colorectal screenings.   Lung Cancer Screening: (Low Dose CT Chest recommended if Age 76-80 years, 30 pack-year currently smoking OR have quit w/in 15years.) does qualify.   Lung Cancer Screening Referral: Patient declined low dose CT.   Additional Screening:  Hepatitis C Screening: does qualify; Completed 06/26/2016  Vision Screening: Recommended annual ophthalmology exams for early detection of glaucoma and other disorders of the eye. Is the patient up to date with their annual eye exam?  No  Who is the provider or what is the name of the office in which the patient attends annual eye exams?  If pt is not established with a provider, would they like to be referred to a provider to establish care? No .   Dental Screening: Recommended annual dental exams for proper oral hygiene  Community Resource Referral / Chronic Care Management: CRR required this visit?  No   CCM required this visit?  No      Plan:  -Discussed most recent lab results which are essentially within normal limits or stable from prior.  Lipid panel has improved. -Patient continues to decline medication therapy for COPD. -BP and pulse stable. -Will change sertraline to Lexapro, patient reports unable to tolerate sertraline 75 mg and sertraline 50 mg ineffective.  Provided tapering instructions for sertraline. -Follow-up in 8 weeks for mood-change medication, HTN  I have personally reviewed and noted the following in the  patient's chart:   Medical and social history Use of alcohol, tobacco or illicit drugs  Current medications and supplements including opioid prescriptions. Patient is not currently taking opioid prescriptions. Functional ability and status Nutritional status Physical activity Advanced directives List of other physicians Hospitalizations, surgeries, and ER visits in previous 12 months Vitals Screenings to include cognitive, depression, and falls Referrals and appointments  In addition, I have reviewed and discussed with patient certain preventive protocols, quality metrics, and best practice recommendations. A written personalized care plan for preventive services as well as general preventive health recommendations were provided to patient.    Lorrene Reid, PA-C   07/01/2021

## 2021-07-01 NOTE — Patient Instructions (Addendum)
Start taking sertraline 25 mg once daily x 1 week. Then take sertraline 25 mg every other day x 1 week and discontinue. When sertraline is discontinued, start Lexapro.    Preventive Care 70 Years and Older, Male Preventive care refers to lifestyle choices and visits with your health care provider that can promote health and wellness. Preventive care visits are also called wellness exams. What can I expect for my preventive care visit? Counseling During your preventive care visit, your health care provider may ask about your: Medical history, including: Past medical problems. Family medical history. History of falls. Current health, including: Emotional well-being. Home life and relationship well-being. Sexual activity. Memory and ability to understand (cognition). Lifestyle, including: Alcohol, nicotine or tobacco, and drug use. Access to firearms. Diet, exercise, and sleep habits. Work and work Statistician. Sunscreen use. Safety issues such as seatbelt and bike helmet use. Physical exam Your health care provider will check your: Height and weight. These may be used to calculate your BMI (body mass index). BMI is a measurement that tells if you are at a healthy weight. Waist circumference. This measures the distance around your waistline. This measurement also tells if you are at a healthy weight and may help predict your risk of certain diseases, such as type 2 diabetes and high blood pressure. Heart rate and blood pressure. Body temperature. Skin for abnormal spots. What immunizations do I need? Vaccines are usually given at various ages, according to a schedule. Your health care provider will recommend vaccines for you based on your age, medical history, and lifestyle or other factors, such as travel or where you work. What tests do I need? Screening Your health care provider may recommend screening tests for certain conditions. This may include: Lipid and cholesterol  levels. Diabetes screening. This is done by checking your blood sugar (glucose) after you have not eaten for a while (fasting). Hepatitis C test. Hepatitis B test. HIV (human immunodeficiency virus) test. STI (sexually transmitted infection) testing, if you are at risk. Lung cancer screening. Colorectal cancer screening. Prostate cancer screening. Abdominal aortic aneurysm (AAA) screening. You may need this if you are a current or former smoker. Talk with your health care provider about your test results, treatment options, and if necessary, the need for more tests. Follow these instructions at home: Eating and drinking  Eat a diet that includes fresh fruits and vegetables, whole grains, lean protein, and low-fat dairy products. Limit your intake of foods with high amounts of sugar, saturated fats, and salt. Take vitamin and mineral supplements as recommended by your health care provider. Do not drink alcohol if your health care provider tells you not to drink. If you drink alcohol: Limit how much you have to 0-2 drinks a day. Know how much alcohol is in your drink. In the [place] one drink equals one 12 oz bottle of beer (355 mL), one 5 oz glass of wine (148 mL), or one 1 oz glass of hard liquor (44 mL). Lifestyle Brush your teeth every morning and night with fluoride toothpaste. Floss one time each day. Exercise for at least 30 minutes 5 or more days each week. Do not use any products that contain nicotine or tobacco. These products include cigarettes, chewing tobacco, and vaping devices, such as e-cigarettes. If you need help quitting, ask your health care provider. Do not use drugs. If you are sexually active, practice safe sex. Use a condom or other form of protection to prevent STIs. Take aspirin only as told by  your health care provider. Make sure that you understand how much to take and what form to take. Work with your health care provider to find out whether it is safe and  beneficial for you to take aspirin daily. Ask your health care provider if you need to take a cholesterol-lowering medicine (statin). Find healthy ways to manage stress, such as: Meditation, yoga, or listening to music. Journaling. Talking to a trusted person. Spending time with friends and family. Safety Always wear your seat belt while driving or riding in a vehicle. Do not drive: If you have been drinking alcohol. Do not ride with someone who has been drinking. When you are tired or distracted. While texting. If you have been using any mind-altering substances or drugs. Wear a helmet and other protective equipment during sports activities. If you have firearms in your house, make sure you follow all gun safety procedures. Minimize exposure to UV radiation to reduce your risk of skin cancer. What's next? Visit your health care provider once a year for an annual wellness visit. Ask your health care provider how often you should have your eyes and teeth checked. Stay up to date on all vaccines. This information is not intended to replace advice given to you by your health care provider. Make sure you discuss any questions you have with your health care provider. Document Revised: 01/23/2021 Document Reviewed: 01/23/2021 Elsevier Patient Education  [employer].

## 2021-09-03 NOTE — Progress Notes (Signed)
Established patient visit   Patient: George [name]   DOB: Jul 06, 1951   71 y.o. Male  MRN: [id number] Visit Date: 09/04/2021  Chief Complaint  Patient presents with   Follow-up    Mood   Hypertension   Subjective    HPI:  Patient presents to follow up on mood and hypertension.  Mood: Patent reports Lexapro not working. Feeling more anxious. Wants to resume sertraline 50 mg which worked better. No SI/HI.  HTN: Pt denies chest pain, palpitations, dizziness or lower extremity swelling. Taking medication as directed without side effects. Checks BP at home in the mornings before he takes his medication and readings have been 140/80s.    Depression screen [facility] 2/9 09/04/2021 07/01/2021 03/27/2021 12/07/2020 09/07/2020  Decreased Interest 0 0 0 0 2  Down, Depressed, Hopeless 0 1 0 0 2  PHQ - 2 Score 0 1 0 0 4  Altered sleeping 0 0 0 0 1  Tired, decreased energy 0 0 0 1 2  Change in appetite 0 0 0 0 3  Feeling bad or failure about yourself  0 0 0 0 0  Trouble concentrating 0 1 0 0 0  Moving slowly or fidgety/restless 0 0 0 0 0  Suicidal thoughts 0 0 0 0 0  PHQ-9 Score 0 2 0 1 10  Difficult doing work/chores Not difficult at all Not difficult at all - - Somewhat difficult  Some recent data might be hidden   GAD 7 : Generalized Anxiety Score 09/04/2021 07/01/2021 03/27/2021 02/09/2019  Nervous, Anxious, on Edge 1 1 0 3  Control/stop worrying 0 1 0 3  Worry too much - different things 0 1 0 3  Trouble relaxing 0 0 0 3  Restless 0 0 0 0  Easily annoyed or irritable 0 0 0 3  Afraid - awful might happen 0 0 0 0  Total GAD 7 Score 1 3 0 15  Anxiety Difficulty Not difficult at all Not difficult at all - Very difficult        Medications: Outpatient Medications Prior to Visit  Medication Sig   amLODipine (NORVASC) 5 MG tablet Take 1 tablet (5 mg total) by mouth daily.   aspirin EC 81 MG tablet Take 1 tablet (81 mg total) by mouth 2 (two) times daily.   losartan (COZAAR) 50 MG tablet  TAKE 1 TABLET (50 MG TOTAL) BY MOUTH DAILY.   [DISCONTINUED] escitalopram (LEXAPRO) 10 MG tablet Take 0.5 tablets (5 mg total) by mouth at bedtime.   [DISCONTINUED] desonide (DESOWEN) 0.05 % cream Apply topically 2 (two) times daily.   [DISCONTINUED] tretinoin (RETIN-A) 0.025 % cream Apply topically at bedtime.   No facility-administered medications prior to visit.    Review of Systems Review of Systems:  A fourteen system review of systems was performed and found to be positive as per HPI.     Objective    BP 113/71    Pulse 86    Temp (!) 97.1 F (36.2 C)    Ht 6\' 2"  (1.88 m)    Wt 245 lb (111.1 kg)    SpO2 94%    BMI 31.46 kg/m  BP Readings from Last 3 Encounters:  09/04/21 113/71  07/01/21 123/81  03/27/21 117/74   Wt Readings from Last 3 Encounters:  09/04/21 245 lb (111.1 kg)  07/01/21 251 lb (113.9 kg)  03/27/21 255 lb 3.2 oz (115.8 kg)    Physical Exam  General:  Well Developed, well nourished, appropriate for stated  age.  Neuro:  Alert and oriented,  extra-ocular muscles intact  HEENT:  Normocephalic, atraumatic, neck supple  Skin:  no gross rash, warm, pink. Cardiac:  RRR, S1 S2 Respiratory: Scattered rhonchi, wheezing and crackles at lung bases. No respiratory distress. Vascular:  Ext warm, no cyanosis apprec.; cap RF less 2 sec. Psych:  No HI/SI, judgement and insight good, Euthymic mood. Full Affect.   No results found for any visits on 09/04/21.  Assessment & Plan      Problem List Items Addressed This Visit       Cardiovascular and Mediastinum   h/o SVT (supraventricular tachycardia) s/p Ablation (Chronic)    -S/p AVNRT ablation. Asx. No reoccurrence.       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 Discussed with patient ambulatory blood pressure readings likely elevated due to checking BP before taking antihypertensives. Recommend to check BP at least 2 hours after taking medications.  -Continue current  medication regimen. See med list. -Will continue to monitor.        Other   GAD (generalized anxiety disorder) - Primary (Chronic)    -Discussed with patient increasing Lexapro and prefers to resume Zoloft 50 mg. Will discontinue Lexapro. PHQ-9 and GAD-7 stable. No SI/HI. -Will continue to monitor.      Relevant Medications   sertraline (ZOLOFT) 50 MG tablet    Return in about 4 months (around 01/02/2022) for Mood , HTN, COPD.        Lorrene Reid, PA-C  [facility] [phone] (phone) [phone] (fax)  [place]

## 2021-09-04 ENCOUNTER — Other Ambulatory Visit: Payer: Self-pay

## 2021-09-04 ENCOUNTER — Encounter: Payer: Self-pay | Admitting: Physician Assistant

## 2021-09-04 ENCOUNTER — Ambulatory Visit (INDEPENDENT_AMBULATORY_CARE_PROVIDER_SITE_OTHER): Payer: Medicare Other | Admitting: Physician Assistant

## 2021-09-04 VITALS — BP 113/71 | HR 86 | Temp 97.1°F | Ht 74.0 in | Wt 245.0 lb

## 2021-09-04 DIAGNOSIS — I151 Hypertension secondary to other renal disorders: Secondary | ICD-10-CM

## 2021-09-04 DIAGNOSIS — I471 Supraventricular tachycardia, unspecified: Secondary | ICD-10-CM

## 2021-09-04 DIAGNOSIS — F411 Generalized anxiety disorder: Secondary | ICD-10-CM

## 2021-09-04 MED ORDER — SERTRALINE HCL 50 MG PO TABS: 50.0000 mg | ORAL_TABLET | Freq: Every day | ORAL | 1 refills | Status: DC

## 2021-09-04 NOTE — Assessment & Plan Note (Signed)
-  BP in office at goal. Discussed with patient ambulatory blood pressure readings likely elevated due to checking BP before taking antihypertensives. Recommend to check BP at least 2 hours after taking medications.  -Continue current medication regimen. See med list. -Will continue to monitor.

## 2021-09-04 NOTE — Assessment & Plan Note (Signed)
-  Discussed with patient increasing Lexapro and prefers to resume Zoloft 50 mg. Will discontinue Lexapro. PHQ-9 and GAD-7 stable. No SI/HI. -Will continue to monitor.

## 2021-09-04 NOTE — Patient Instructions (Signed)

## 2021-09-04 NOTE — Assessment & Plan Note (Signed)
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

## 2021-09-10 NOTE — Progress Notes (Signed)
error 

## 2021-11-14 ENCOUNTER — Other Ambulatory Visit: Payer: Self-pay | Admitting: Physician Assistant

## 2021-11-14 DIAGNOSIS — N049 Nephrotic syndrome with unspecified morphologic changes: Secondary | ICD-10-CM

## 2021-11-14 DIAGNOSIS — I151 Hypertension secondary to other renal disorders: Secondary | ICD-10-CM

## 2022-01-02 ENCOUNTER — Other Ambulatory Visit: Payer: Self-pay | Admitting: Physician Assistant

## 2022-01-02 DIAGNOSIS — I151 Hypertension secondary to other renal disorders: Secondary | ICD-10-CM

## 2022-01-07 ENCOUNTER — Ambulatory Visit (INDEPENDENT_AMBULATORY_CARE_PROVIDER_SITE_OTHER): Payer: Medicare Other | Admitting: Physician Assistant

## 2022-01-07 ENCOUNTER — Encounter: Payer: Self-pay | Admitting: Physician Assistant

## 2022-01-07 VITALS — BP 91/61 | HR 89 | Temp 97.7°F | Ht 74.0 in | Wt 242.0 lb

## 2022-01-07 DIAGNOSIS — F411 Generalized anxiety disorder: Secondary | ICD-10-CM

## 2022-01-07 DIAGNOSIS — J449 Chronic obstructive pulmonary disease, unspecified: Secondary | ICD-10-CM

## 2022-01-07 DIAGNOSIS — I151 Hypertension secondary to other renal disorders: Secondary | ICD-10-CM

## 2022-01-07 MED ORDER — FLUOXETINE HCL 10 MG PO TABS: 10.0000 mg | ORAL_TABLET | Freq: Every day | ORAL | 1 refills | Status: DC

## 2022-01-07 NOTE — Patient Instructions (Addendum)
Start taking Zoloft 0.5 tablet by mouth daily for the rest of the week and then start Prozac 10 mg daily.   High Cholesterol  High cholesterol is a condition in which the blood has high levels of a white, waxy substance similar to fat (cholesterol). The liver makes all the cholesterol that the body needs. The human body needs small amounts of cholesterol to help build cells. A person gets extra or excess cholesterol from the food that he or she eats. The blood carries cholesterol from the liver to the rest of the body. If you have high cholesterol, deposits (plaques) may build up on the walls of your arteries. Arteries are the blood vessels that carry blood away from your heart. These plaques make the arteries narrow and stiff. Cholesterol plaques increase your risk for heart attack and stroke. Work with your health care provider to keep your cholesterol levels in a healthy range. What increases the risk? The following factors may make you more likely to develop this condition: Eating foods that are high in animal fat (saturated fat) or cholesterol. Being overweight. Not getting enough exercise. A family history of high cholesterol (familial hypercholesterolemia). Use of tobacco products. Having diabetes. What are the signs or symptoms? In most cases, high cholesterol does not usually cause any symptoms. In severe cases, very high cholesterol levels can cause: Fatty bumps under the skin (xanthomas). A white or gray ring around the black center (pupil) of the eye. How is this diagnosed? This condition may be diagnosed based on the results of a blood test. If you are older than 73 years of age, your health care provider may check your cholesterol levels every 4-6 years. You may be checked more often if you have high cholesterol or other risk factors for heart disease. The blood test for cholesterol measures: "Bad" cholesterol, or LDL cholesterol. This is the main type of cholesterol that  causes heart disease. The desired level is less than 100 mg/dL (2.59 mmol/L). "Good" cholesterol, or HDL cholesterol. HDL helps protect against heart disease by cleaning the arteries and carrying the LDL to the liver for processing. The desired level for HDL is 60 mg/dL (1.55 mmol/L) or higher. Triglycerides. These are fats that your body can store or burn for energy. The desired level is less than 150 mg/dL (1.69 mmol/L). Total cholesterol. This measures the total amount of cholesterol in your blood and includes LDL, HDL, and triglycerides. The desired level is less than 200 mg/dL (5.17 mmol/L). How is this treated? Treatment for high cholesterol starts with lifestyle changes, such as diet and exercise. Diet changes. You may be asked to eat foods that have more fiber and less saturated fats or added sugar. Lifestyle changes. These may include regular exercise, maintaining a healthy weight, and quitting use of tobacco products. Medicines. These are given when diet and lifestyle changes have not worked. You may be prescribed a statin medicine to help lower your cholesterol levels. Follow these instructions at home: Eating and drinking  Eat a healthy, balanced diet. This diet includes: Daily servings of a variety of fresh, frozen, or canned fruits and vegetables. Daily servings of whole grain foods that are rich in fiber. Foods that are low in saturated fats and trans fats. These include poultry and fish without skin, lean cuts of meat, and low-fat dairy products. A variety of fish, especially oily fish that contain omega-3 fatty acids. Aim to eat fish at least 2 times a week. Avoid foods and drinks that have  added sugar. Use healthy cooking methods, such as roasting, grilling, broiling, baking, poaching, steaming, and stir-frying. Do not fry your food except for stir-frying. If you drink alcohol: Limit how much you have to: 0-1 drink a day for women who are not pregnant. 0-2 drinks a day for  men. Know how much alcohol is in a drink. In the [place] one drink equals one 12 oz bottle of beer (355 mL), one 5 oz glass of wine (148 mL), or one 1 oz glass of hard liquor (44 mL). Lifestyle  Get regular exercise. Aim to exercise for a total of 150 minutes a week. Increase your activity level by doing activities such as gardening, walking, and taking the stairs. Do not use any products that contain nicotine or tobacco. These products include cigarettes, chewing tobacco, and vaping devices, such as e-cigarettes. If you need help quitting, ask your health care provider. General instructions Take over-the-counter and prescription medicines only as told by your health care provider. Keep all follow-up visits. This is important. Where to find more information American Heart Association: www.heart.org National Heart, Lung, and Blood Institute: https://wilson-eaton.com/ Contact a health care provider if: You have trouble achieving or maintaining a healthy diet or weight. You are starting an exercise program. You are unable to stop smoking. Get help right away if: You have chest pain. You have trouble breathing. You have discomfort or pain in your jaw, neck, back, shoulder, or arm. You have any symptoms of a stroke. "BE FAST" is an easy way to remember the main warning signs of a stroke: B - Balance. Signs are dizziness, sudden trouble walking, or loss of balance. E - Eyes. Signs are trouble seeing or a sudden change in vision. F - Face. Signs are sudden weakness or numbness of the face, or the face or eyelid drooping on one side. A - Arms. Signs are weakness or numbness in an arm. This happens suddenly and usually on one side of the body. S - Speech. Signs are sudden trouble speaking, slurred speech, or trouble understanding what people say. T - Time. Time to call emergency services. Write down what time symptoms started. You have other signs of a stroke, such as: A sudden, severe headache with no  known cause. Nausea or vomiting. Seizure. These symptoms may represent a serious problem that is an emergency. Do not wait to see if the symptoms will go away. Get medical help right away. Call your local emergency services (911 in the [place] Do not drive yourself to the hospital. Summary Cholesterol plaques increase your risk for heart attack and stroke. Work with your health care provider to keep your cholesterol levels in a healthy range. Eat a healthy, balanced diet, get regular exercise, and maintain a healthy weight. Do not use any products that contain nicotine or tobacco. These products include cigarettes, chewing tobacco, and vaping devices, such as e-cigarettes. Get help right away if you have any symptoms of a stroke. This information is not intended to replace advice given to you by your health care provider. Make sure you discuss any questions you have with your health care provider. Document Revised: 10/11/2020 Document Reviewed: 10/01/2020 Elsevier Patient Education  Impact.

## 2022-01-07 NOTE — Progress Notes (Signed)
Established patient visit   Patient: George [name]   DOB: 1951-02-24   71 y.o. Male  MRN: [id number] Visit Date: 01/07/2022  Chief Complaint  Patient presents with   Follow-up   Subjective    HPI  Patient presents for chronic follow-up.  HTN: Pt denies chest pain, palpitations, dizziness or lower extremity swelling. Patient reports takes Losartan in the morning and amlodipine in the afternoon. States sometimes will take half tablet of Losartan in the afternoon if has a headache because blood pressure is elevated. Patient reports blood pressure yesterday was 114/90s.   Mood: Patient reports has not noticed a difference with anxiety since starting Zoloft 50 mg. States wants to try something different. In the past has tried Lexapro and Celexa. No SI/HI.  COPD: Reports breathing is not worse. Feels like breathing better now versus during winter.        01/07/2022   10:47 AM 09/04/2021   10:14 AM 07/01/2021   10:55 AM 03/27/2021   10:05 AM 12/07/2020    7:56 AM  Depression screen PHQ 2/9  Decreased Interest 0 0 0 0 0  Down, Depressed, Hopeless 0 0 1 0 0  PHQ - 2 Score 0 0 1 0 0  Altered sleeping 0 0 0 0 0  Tired, decreased energy 0 0 0 0 1  Change in appetite 0 0 0 0 0  Feeling bad or failure about yourself  0 0 0 0 0  Trouble concentrating 0 0 1 0 0  Moving slowly or fidgety/restless 0 0 0 0 0  Suicidal thoughts 0 0 0 0 0  PHQ-9 Score 0 0 2 0 1  Difficult doing work/chores  Not difficult at all Not difficult at all        01/07/2022   10:48 AM 09/04/2021   10:14 AM 07/01/2021   10:56 AM 03/27/2021   10:19 AM  GAD 7 : Generalized Anxiety Score  Nervous, Anxious, on Edge 0 1 1 0  Control/stop worrying 0 0 1 0  Worry too much - different things 0 0 1 0  Trouble relaxing 0 0 0 0  Restless 0 0 0 0  Easily annoyed or irritable 0 0 0 0  Afraid - awful might happen 0 0 0 0  Total GAD 7 Score 0 1 3 0  Anxiety Difficulty Not difficult at all Not difficult at all Not difficult  at all         Medications: Outpatient Medications Prior to Visit  Medication Sig   amLODipine (NORVASC) 5 MG tablet TAKE 1 TABLET BY MOUTH DAILY.   aspirin EC 81 MG tablet Take 1 tablet (81 mg total) by mouth 2 (two) times daily.   losartan (COZAAR) 50 MG tablet TAKE 1 TABLET (50 MG TOTAL) BY MOUTH DAILY.   [DISCONTINUED] sertraline (ZOLOFT) 50 MG tablet Take 1 tablet (50 mg total) by mouth daily.   No facility-administered medications prior to visit.    Review of Systems Review of Systems:  A fourteen system review of systems was performed and found to be positive as per HPI.  Last CBC Lab Results  Component Value Date   WBC 6.0 06/26/2021   HGB 16.7 06/26/2021   HCT 49.9 06/26/2021   MCV 85 06/26/2021   MCH 28.5 06/26/2021   RDW 14.9 06/26/2021   PLT 246 26/33/3545   Last metabolic panel Lab Results  Component Value Date   GLUCOSE 98 06/26/2021   NA 136 06/26/2021   K 4.9  06/26/2021   CL 97 06/26/2021   CO2 24 06/26/2021   BUN 15 06/26/2021   CREATININE 1.20 06/26/2021   EGFR 64 06/26/2021   CALCIUM 9.2 06/26/2021   PROT 6.9 06/26/2021   ALBUMIN 4.5 06/26/2021   LABGLOB 2.4 06/26/2021   AGRATIO 1.9 06/26/2021   BILITOT 0.3 06/26/2021   ALKPHOS 89 06/26/2021   AST 16 06/26/2021   ALT 13 06/26/2021   ANIONGAP 9 12/23/2019   Last lipids Lab Results  Component Value Date   CHOL 149 06/26/2021   HDL 37 (L) 06/26/2021   LDLCALC 95 06/26/2021   TRIG 88 06/26/2021   CHOLHDL 4.0 06/26/2021   Last hemoglobin A1c Lab Results  Component Value Date   HGBA1C 5.5 06/26/2021   Last thyroid functions Lab Results  Component Value Date   TSH 3.300 06/26/2021   T3TOTAL 102 08/25/2019   Last vitamin D Lab Results  Component Value Date   VD25OH 29.7 (L) 08/25/2019     Objective    BP 91/61   Pulse 89   Temp 97.7 F (36.5 C)   Ht 6' 2"  (1.88 m)   Wt 242 lb (109.8 kg)   SpO2 97%   BMI 31.07 kg/m  BP Readings from Last 3 Encounters:  01/07/22 91/61   09/04/21 113/71  07/01/21 123/81   Wt Readings from Last 3 Encounters:  01/07/22 242 lb (109.8 kg)  09/04/21 245 lb (111.1 kg)  07/01/21 251 lb (113.9 kg)    Physical Exam  General:  cooperative, in no acute distress, appropriate for stated age.  Neuro:  Alert and oriented,  extra-ocular muscles intact  HEENT:  Normocephalic, atraumatic, neck supple  Skin:  no gross rash, warm, pink. Cardiac:  RRR, S1 S2 Respiratory: Scattered rhonchi and wheezing Vascular:  Ext warm, no cyanosis apprec.; cap RF less 2 sec. Psych:  No HI/SI, judgement and insight good, Euthymic mood. Full Affect.   No results found for any visits on 01/07/22.  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 Will continue current medication regimen. Recommend monitoring for factors possibly contributing to situational elevated blood pressure readings. Will continue to monitor. Will collect CMP to monitor renal function and electrolytes.       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 Oxygen saturation 97%.         Other   GAD (generalized anxiety disorder) (Chronic)    -Discussed alternative treatment options including SSRI-Prozac or SNRI- Cymbalta and patient wants to trial Prozac. Advised to start taking sertraline 25 mg for the rest of the week, then discontinue and start Prozac 10 mg next Monday. Discussed potential side effects. Will reassess mood and medication therapy in 4 weeks.        Relevant Medications   FLUoxetine (PROZAC) 10 MG tablet    Return in about 4 weeks (around 02/04/2022) for Mood- changed med.        Lorrene Reid, PA-C  [facility] at [facility] [phone]  (phone) [phone] (fax)  [place]

## 2022-01-07 NOTE — Assessment & Plan Note (Signed)
-  Patient has repeatedly declined medication therapy and low dose chest CT for lung cancer screening. Oxygen saturation 97%.

## 2022-01-07 NOTE — Assessment & Plan Note (Signed)
-  Discussed alternative treatment options including SSRI-Prozac or SNRI- Cymbalta and patient wants to trial Prozac. Advised to start taking sertraline 25 mg for the rest of the week, then discontinue and start Prozac 10 mg next Monday. Discussed potential side effects. Will reassess mood and medication therapy in 4 weeks.

## 2022-01-07 NOTE — Assessment & Plan Note (Signed)
-  BP today lower than baseline. Will continue current medication regimen. Recommend monitoring for factors possibly contributing to situational elevated blood pressure readings. Will continue to monitor. Will collect CMP to monitor renal function and electrolytes.

## 2022-01-08 LAB — COMPREHENSIVE METABOLIC PANEL
ALT: 11 IU/L (ref 0–44)
AST: 15 IU/L (ref 0–40)
Albumin/Globulin Ratio: 1.8 (ref 1.2–2.2)
Albumin: 4.6 g/dL (ref 3.7–4.7)
Alkaline Phosphatase: 81 IU/L (ref 44–121)
BUN/Creatinine Ratio: 15 (ref 10–24)
BUN: 18 mg/dL (ref 8–27)
Bilirubin Total: 0.3 mg/dL (ref 0.0–1.2)
CO2: 23 mmol/L (ref 20–29)
Calcium: 9.4 mg/dL (ref 8.6–10.2)
Chloride: 102 mmol/L (ref 96–106)
Creatinine, Ser: 1.18 mg/dL (ref 0.76–1.27)
Globulin, Total: 2.6 g/dL (ref 1.5–4.5)
Glucose: 111 mg/dL — ABNORMAL HIGH (ref 70–99)
Potassium: 4.5 mmol/L (ref 3.5–5.2)
Sodium: 141 mmol/L (ref 134–144)
Total Protein: 7.2 g/dL (ref 6.0–8.5)
eGFR: 66 mL/min/{1.73_m2} (ref >59)

## 2022-02-04 ENCOUNTER — Ambulatory Visit (INDEPENDENT_AMBULATORY_CARE_PROVIDER_SITE_OTHER): Payer: Medicare Other | Admitting: Physician Assistant

## 2022-02-04 ENCOUNTER — Encounter: Payer: Self-pay | Admitting: Physician Assistant

## 2022-02-04 VITALS — BP 101/62 | HR 93 | Temp 97.7°F | Ht 74.0 in | Wt 242.0 lb

## 2022-02-04 DIAGNOSIS — F411 Generalized anxiety disorder: Secondary | ICD-10-CM | POA: Diagnosis not present

## 2022-02-04 MED ORDER — DULOXETINE HCL 20 MG PO CPEP: 20.0000 mg | ORAL_CAPSULE | Freq: Every day | ORAL | 0 refills | Status: DC

## 2022-02-04 NOTE — Progress Notes (Signed)
Established patient visit   Patient: George [name]   DOB: 1951-05-08   71 y.o. Male  MRN: [id number] Visit Date: 02/04/2022  Chief Complaint  Patient presents with   Follow-up   Subjective    HPI  Patient presents for follow-up on mood management. Patient was changed from sertraline to fluoxetine. Reports likes Zoloft better than Prozac. States Prozac makes him feel weird physically.      02/04/2022   10:50 AM 01/07/2022   10:47 AM 09/04/2021   10:14 AM 07/01/2021   10:55 AM 03/27/2021   10:05 AM  Depression screen PHQ 2/9  Decreased Interest 0 0 0 0 0  Down, Depressed, Hopeless 0 0 0 1 0  PHQ - 2 Score 0 0 0 1 0  Altered sleeping 0 0 0 0 0  Tired, decreased energy 0 0 0 0 0  Change in appetite 0 0 0 0 0  Feeling bad or failure about yourself  0 0 0 0 0  Trouble concentrating 0 0 0 1 0  Moving slowly or fidgety/restless 0 0 0 0 0  Suicidal thoughts 0 0 0 0 0  PHQ-9 Score 0 0 0 2 0  Difficult doing work/chores Not difficult at all  Not difficult at all Not difficult at all       02/04/2022   10:50 AM 01/07/2022   10:48 AM 09/04/2021   10:14 AM 07/01/2021   10:56 AM  GAD 7 : Generalized Anxiety Score  Nervous, Anxious, on Edge 0 0 1 1  Control/stop worrying 0 0 0 1  Worry too much - different things 0 0 0 1  Trouble relaxing 0 0 0 0  Restless 0 0 0 0  Easily annoyed or irritable 0 0 0 0  Afraid - awful might happen 0 0 0 0  Total GAD 7 Score 0 0 1 3  Anxiety Difficulty Not difficult at all Not difficult at all Not difficult at all Not difficult at all        Medications: Outpatient Medications Prior to Visit  Medication Sig   amLODipine (NORVASC) 5 MG tablet TAKE 1 TABLET BY MOUTH DAILY.   aspirin EC 81 MG tablet Take 1 tablet (81 mg total) by mouth 2 (two) times daily.   losartan (COZAAR) 50 MG tablet TAKE 1 TABLET (50 MG TOTAL) BY MOUTH DAILY.   [DISCONTINUED] FLUoxetine (PROZAC) 10 MG tablet Take 1 tablet (10 mg total) by mouth daily.   No  facility-administered medications prior to visit.    Review of Systems Review of Systems:  A fourteen system review of systems was performed and found to be positive as per HPI.  Last CBC Lab Results  Component Value Date   WBC 6.0 06/26/2021   HGB 16.7 06/26/2021   HCT 49.9 06/26/2021   MCV 85 06/26/2021   MCH 28.5 06/26/2021   RDW 14.9 06/26/2021   PLT 246 06/26/2021   Last metabolic panel Lab Results  Component Value Date   GLUCOSE 111 (H) 01/07/2022   NA 141 01/07/2022   K 4.5 01/07/2022   CL 102 01/07/2022   CO2 23 01/07/2022   BUN 18 01/07/2022   CREATININE 1.18 01/07/2022   EGFR 66 01/07/2022   CALCIUM 9.4 01/07/2022   PROT 7.2 01/07/2022   ALBUMIN 4.6 01/07/2022   LABGLOB 2.6 01/07/2022   AGRATIO 1.8 01/07/2022   BILITOT 0.3 01/07/2022   ALKPHOS 81 01/07/2022   AST 15 01/07/2022   ALT 11 01/07/2022  ANIONGAP 9 12/23/2019   Last lipids Lab Results  Component Value Date   CHOL 149 06/26/2021   HDL 37 (L) 06/26/2021   LDLCALC 95 06/26/2021   TRIG 88 06/26/2021   CHOLHDL 4.0 06/26/2021   Last hemoglobin A1c Lab Results  Component Value Date   HGBA1C 5.5 06/26/2021   Last thyroid functions Lab Results  Component Value Date   TSH 3.300 06/26/2021   T3TOTAL 102 08/25/2019     Objective    BP 101/62   Pulse 93   Temp 97.7 F (36.5 C)   Ht 6\' 2"  (1.88 m)   Wt 242 lb (109.8 kg)   SpO2 96%   BMI 31.07 kg/m  BP Readings from Last 3 Encounters:  02/04/22 101/62  01/07/22 91/61  09/04/21 113/71   Wt Readings from Last 3 Encounters:  02/04/22 242 lb (109.8 kg)  01/07/22 242 lb (109.8 kg)  09/04/21 245 lb (111.1 kg)    Physical Exam  General:  Cooperative, in no acute distress, appropriate for stated age.  Neuro:  Alert and oriented,  extra-ocular muscles intact  HEENT:  Normocephalic, atraumatic, neck supple  Skin:  no gross rash, warm, pink. Cardiac:  RRR Respiratory: Scattered wheezing and rhonchi. No rales. Vascular:  Ext warm, no  cyanosis apprec.; cap RF less 2 sec. Psych:  No HI/SI, judgement and insight good, Euthymic mood. Full Affect.   No results found for any visits on 02/04/22.  Assessment & Plan      Problem List Items Addressed This Visit       Other   GAD (generalized anxiety disorder) - Primary (Chronic)   Relevant Medications   DULoxetine (CYMBALTA) 20 MG capsule   GAD: -Discussed with patient a trial Cymbalta before going back to Zoloft which could help with anxiety and also chronic pain. Patient is agreeable. Discussed potential side effects and advised to let me know if unable to tolerate medication. Discussed to stop Prozac and then start Duloxetine 20 mg daily 5-7 days after. Will reassess mood and medication therapy in 4 weeks.   Return in about 1 month (around 03/06/2022) for Mood- changed med.        Mayer Masker, PA-C  [facility] at [facility] [phone] (phone) [phone] (fax)  [facility]

## 2022-03-05 ENCOUNTER — Ambulatory Visit (INDEPENDENT_AMBULATORY_CARE_PROVIDER_SITE_OTHER): Payer: Medicare Other | Admitting: Physician Assistant

## 2022-03-05 ENCOUNTER — Encounter: Payer: Self-pay | Admitting: Physician Assistant

## 2022-03-05 VITALS — BP 101/69 | HR 97 | Ht 74.02 in | Wt 241.1 lb

## 2022-03-05 DIAGNOSIS — F411 Generalized anxiety disorder: Secondary | ICD-10-CM | POA: Diagnosis not present

## 2022-03-05 MED ORDER — DULOXETINE HCL 30 MG PO CPEP: 30.0000 mg | ORAL_CAPSULE | Freq: Every day | ORAL | 2 refills | Status: DC

## 2022-03-05 NOTE — Progress Notes (Signed)
Established patient visit   Patient: George [name]   DOB: 1951-01-22   71 y.o. Male  MRN: [id number] Visit Date: 03/05/2022  Chief Complaint  Patient presents with   Follow-up   Subjective    HPI  Patient presents for mood follow-up. Patient reports tolerating Cymbalta without issues. Noticed a significant improvement the first couple of days with anxiety but then medication seemed to level off.      03/05/2022   11:14 AM 02/04/2022   10:50 AM 01/07/2022   10:47 AM 09/04/2021   10:14 AM 07/01/2021   10:55 AM  Depression screen PHQ 2/9  Decreased Interest 0 0 0 0 0  Down, Depressed, Hopeless 0 0 0 0 1  PHQ - 2 Score 0 0 0 0 1  Altered sleeping 0 0 0 0 0  Tired, decreased energy 0 0 0 0 0  Change in appetite 0 0 0 0 0  Feeling bad or failure about yourself  0 0 0 0 0  Trouble concentrating 0 0 0 0 1  Moving slowly or fidgety/restless 0 0 0 0 0  Suicidal thoughts 0 0 0 0 0  PHQ-9 Score 0 0 0 0 2  Difficult doing work/chores  Not difficult at all  Not difficult at all Not difficult at all      03/05/2022   11:15 AM 02/04/2022   10:50 AM 01/07/2022   10:48 AM 09/04/2021   10:14 AM  GAD 7 : Generalized Anxiety Score  Nervous, Anxious, on Edge 0 0 0 1  Control/stop worrying 0 0 0 0  Worry too much - different things 0 0 0 0  Trouble relaxing 0 0 0 0  Restless 0 0 0 0  Easily annoyed or irritable 0 0 0 0  Afraid - awful might happen 0 0 0 0  Total GAD 7 Score 0 0 0 1  Anxiety Difficulty  Not difficult at all Not difficult at all Not difficult at all        Medications: Outpatient Medications Prior to Visit  Medication Sig   amLODipine (NORVASC) 5 MG tablet TAKE 1 TABLET BY MOUTH DAILY.   aspirin EC 81 MG tablet Take 1 tablet (81 mg total) by mouth 2 (two) times daily.   losartan (COZAAR) 50 MG tablet TAKE 1 TABLET (50 MG TOTAL) BY MOUTH DAILY.   [DISCONTINUED] DULoxetine (CYMBALTA) 20 MG capsule Take 1 capsule (20 mg total) by mouth daily.   No facility-administered  medications prior to visit.    Review of Systems Review of Systems:  A fourteen system review of systems was performed and found to be positive as per HPI.  Last CBC Lab Results  Component Value Date   WBC 6.0 06/26/2021   HGB 16.7 06/26/2021   HCT 49.9 06/26/2021   MCV 85 06/26/2021   MCH 28.5 06/26/2021   RDW 14.9 06/26/2021   PLT 246 80/16/5537   Last metabolic panel Lab Results  Component Value Date   GLUCOSE 111 (H) 01/07/2022   NA 141 01/07/2022   K 4.5 01/07/2022   CL 102 01/07/2022   CO2 23 01/07/2022   BUN 18 01/07/2022   CREATININE 1.18 01/07/2022   EGFR 66 01/07/2022   CALCIUM 9.4 01/07/2022   PROT 7.2 01/07/2022   ALBUMIN 4.6 01/07/2022   LABGLOB 2.6 01/07/2022   AGRATIO 1.8 01/07/2022   BILITOT 0.3 01/07/2022   ALKPHOS 81 01/07/2022   AST 15 01/07/2022   ALT 11 01/07/2022   ANIONGAP  9 12/23/2019   Last lipids Lab Results  Component Value Date   CHOL 149 06/26/2021   HDL 37 (L) 06/26/2021   LDLCALC 95 06/26/2021   TRIG 88 06/26/2021   CHOLHDL 4.0 06/26/2021   Last hemoglobin A1c Lab Results  Component Value Date   HGBA1C 5.5 06/26/2021   Last thyroid functions Lab Results  Component Value Date   TSH 3.300 06/26/2021   T3TOTAL 102 08/25/2019   Last vitamin D Lab Results  Component Value Date   VD25OH 29.7 (L) 08/25/2019     Objective    BP 101/69   Pulse 97   Ht 6' 2.02" (1.88 m)   Wt 241 lb 1.9 oz (109.4 kg)   SpO2 95%   BMI 30.94 kg/m  BP Readings from Last 3 Encounters:  03/05/22 101/69  02/04/22 101/62  01/07/22 91/61   Wt Readings from Last 3 Encounters:  03/05/22 241 lb 1.9 oz (109.4 kg)  02/04/22 242 lb (109.8 kg)  01/07/22 242 lb (109.8 kg)    Physical Exam  General:  Well Developed, well nourished, appropriate for stated age.  Neuro:  Alert and oriented,  extra-ocular muscles intact  HEENT:  Normocephalic, atraumatic, neck supple  Skin:  no gross rash, warm, pink. Cardiac:  RRR, S1 S2 Respiratory: Scattered  rhonchi and wheezing, crackles at lung bases Vascular:  Ext warm, no cyanosis apprec.; cap RF less 2 sec. Psych:  No HI/SI, judgement and insight good, Euthymic mood. Full Affect.   No results found for any visits on 03/05/22.  Assessment & Plan      Problem List Items Addressed This Visit       Other   GAD (generalized anxiety disorder) - Primary (Chronic)    -Some improvement. Patient tolerating medication without issues so will increase Duloxetine to 30 mg daily. Advised patient to let me know if unable to tolerate increased dose. Will continue to monitor.      Relevant Medications   DULoxetine (CYMBALTA) 30 MG capsule    Return in about 4 months (around 07/06/2022) for MCW and FBW.        Lorrene Reid, PA-C  [facility] at [facility] [phone] (phone) [phone] (fax)  [place]

## 2022-03-05 NOTE — Assessment & Plan Note (Addendum)
-  Some improvement. Patient tolerating medication without issues so will increase Duloxetine to 30 mg daily. Advised patient to let me know if unable to tolerate increased dose. Patient verbalized understanding. Will continue to monitor.

## 2022-03-05 NOTE — Patient Instructions (Signed)

## 2022-04-08 ENCOUNTER — Other Ambulatory Visit: Payer: Self-pay | Admitting: Physician Assistant

## 2022-04-08 DIAGNOSIS — I151 Hypertension secondary to other renal disorders: Secondary | ICD-10-CM

## 2022-04-08 DIAGNOSIS — N049 Nephrotic syndrome with unspecified morphologic changes: Secondary | ICD-10-CM

## 2022-05-30 ENCOUNTER — Other Ambulatory Visit: Payer: Self-pay | Admitting: Physician Assistant

## 2022-05-30 DIAGNOSIS — F411 Generalized anxiety disorder: Secondary | ICD-10-CM

## 2022-06-04 ENCOUNTER — Other Ambulatory Visit: Payer: Self-pay | Admitting: Physician Assistant

## 2022-06-04 DIAGNOSIS — I151 Hypertension secondary to other renal disorders: Secondary | ICD-10-CM

## 2022-08-27 ENCOUNTER — Other Ambulatory Visit: Payer: Self-pay | Admitting: Nurse Practitioner

## 2022-08-27 DIAGNOSIS — N049 Nephrotic syndrome with unspecified morphologic changes: Secondary | ICD-10-CM

## 2022-08-27 DIAGNOSIS — I151 Hypertension secondary to other renal disorders: Secondary | ICD-10-CM

## 2022-08-28 ENCOUNTER — Other Ambulatory Visit: Payer: Self-pay | Admitting: Nurse Practitioner

## 2022-08-28 ENCOUNTER — Other Ambulatory Visit: Payer: Self-pay

## 2022-08-28 DIAGNOSIS — F411 Generalized anxiety disorder: Secondary | ICD-10-CM

## 2022-08-28 DIAGNOSIS — N049 Nephrotic syndrome with unspecified morphologic changes: Secondary | ICD-10-CM

## 2022-08-28 DIAGNOSIS — I151 Hypertension secondary to other renal disorders: Secondary | ICD-10-CM

## 2022-08-28 MED ORDER — LOSARTAN POTASSIUM 50 MG PO TABS: 50.0000 mg | ORAL_TABLET | Freq: Every day | ORAL | 0 refills | Status: DC

## 2022-09-01 ENCOUNTER — Telehealth: Payer: Self-pay

## 2022-09-01 DIAGNOSIS — F411 Generalized anxiety disorder: Secondary | ICD-10-CM

## 2022-09-01 MED ORDER — DULOXETINE HCL 30 MG PO CPEP: 30.0000 mg | ORAL_CAPSULE | Freq: Every day | ORAL | 0 refills | Status: DC

## 2022-09-01 NOTE — Telephone Encounter (Signed)
Refill has been sent.  °

## 2022-09-01 NOTE — Telephone Encounter (Signed)
Pt is requesting a refill on: DULoxetine (CYMBALTA) 30 MG capsule   Pharmacy: [place], [place] 03/05/22 ROV 10/09/22

## 2022-09-30 ENCOUNTER — Other Ambulatory Visit: Payer: Self-pay | Admitting: Nurse Practitioner

## 2022-09-30 DIAGNOSIS — F411 Generalized anxiety disorder: Secondary | ICD-10-CM

## 2022-10-09 ENCOUNTER — Encounter: Payer: Self-pay | Admitting: Family Medicine

## 2022-10-09 ENCOUNTER — Ambulatory Visit (INDEPENDENT_AMBULATORY_CARE_PROVIDER_SITE_OTHER): Payer: Medicare Other | Admitting: Family Medicine

## 2022-10-09 ENCOUNTER — Other Ambulatory Visit: Payer: Self-pay | Admitting: *Deleted

## 2022-10-09 VITALS — BP 147/97 | HR 85 | Ht 74.02 in | Wt 230.0 lb

## 2022-10-09 DIAGNOSIS — I10 Essential (primary) hypertension: Secondary | ICD-10-CM

## 2022-10-09 DIAGNOSIS — Z0000 Encounter for general adult medical examination without abnormal findings: Secondary | ICD-10-CM | POA: Diagnosis not present

## 2022-10-09 DIAGNOSIS — E785 Hyperlipidemia, unspecified: Secondary | ICD-10-CM | POA: Diagnosis not present

## 2022-10-09 DIAGNOSIS — E039 Hypothyroidism, unspecified: Secondary | ICD-10-CM

## 2022-10-09 DIAGNOSIS — Z8639 Personal history of other endocrine, nutritional and metabolic disease: Secondary | ICD-10-CM | POA: Diagnosis not present

## 2022-10-09 DIAGNOSIS — J449 Chronic obstructive pulmonary disease, unspecified: Secondary | ICD-10-CM

## 2022-10-09 DIAGNOSIS — E559 Vitamin D deficiency, unspecified: Secondary | ICD-10-CM

## 2022-10-09 DIAGNOSIS — I151 Hypertension secondary to other renal disorders: Secondary | ICD-10-CM

## 2022-10-09 MED ORDER — AMLODIPINE BESYLATE 5 MG PO TABS: 5.0000 mg | ORAL_TABLET | Freq: Every day | ORAL | 1 refills | Status: DC

## 2022-10-09 NOTE — Progress Notes (Signed)
Subjective:   George [name] is a 72 y.o. male who presents for Medicare Annual/Subsequent preventive examination.  Review of Systems    Review of Systems  All other systems reviewed and are negative.         Objective:    Today's Vitals   10/09/22 1352  BP: (!) 147/97  Pulse: 85  SpO2: 94%  Weight: 230 lb (104.3 kg)  Height: 6' 2.02" (1.88 m)  PainSc: 0-No pain   Body mass index is 29.51 kg/m.     10/09/2022    1:59 PM 12/23/2019    8:44 AM 12/22/2019    8:22 PM 11/17/2019    8:30 AM 11/14/2019    7:12 AM 11/08/2019   11:16 AM 04/29/2017    9:34 AM  Advanced Directives  Does Patient Have a Medical Advance Directive? Yes Yes No Yes Yes Yes Yes  Type of Corporate treasurer of [place];Living will  [place];Living will [place];Living will Living will;Healthcare Power of [place];Living will  Does patient want to make changes to medical advance directive?  No - Patient declined  No - Patient declined     Copy of [place] in Chart?  No - copy requested         Current Medications (verified) Outpatient Encounter Medications as of 10/09/2022  Medication Sig   amLODipine (NORVASC) 5 MG tablet TAKE 1 TABLET BY MOUTH DAILY.   aspirin EC 81 MG tablet Take 1 tablet (81 mg total) by mouth 2 (two) times daily.   DULoxetine (CYMBALTA) 30 MG capsule TAKE 1 CAPSULE (30 MG TOTAL) BY MOUTH DAILY.   losartan (COZAAR) 50 MG tablet Take 1 tablet (50 mg total) by mouth daily.   No facility-administered encounter medications on file as of 10/09/2022.    Allergies (verified) Patient has no known allergies.   History: Past Medical History:  Diagnosis Date   AAA (abdominal aortic aneurysm) ([place])    a. [place] 09/2015 - 3cm distal AAA. //  b. [place] 5/17: 3.1 cm    Basal cell carcinoma of right side of nose    Berger's disease    CKD (chronic kidney disease) 09/2015   COPD (chronic obstructive  pulmonary disease) ([facility])    GERD (gastroesophageal reflux disease)    Nephrotic syndrome    Panic attacks    Sleep apnea    SVT (supraventricular tachycardia) 05/22/2015   Past Surgical History:  Procedure Laterality Date   BASAL CELL CARCINOMA EXCISION Right ~ 06/2015   "side of my nose"   ELECTROPHYSIOLOGIC STUDY N/A 09/10/2015   Procedure: SVT Ablation;  Surgeon: Evans Lance, MD;  Location: [facility];  Service: Cardiovascular;  Laterality: N/A;   FEMUR FRACTURE SURGERY Right 1970   "had plate & pin put in"   [place] Right 1971   "removed plate; left pin in   FRACTURE SURGERY     KNEE ARTHROSCOPY Right ~ [place]  ~ [place] Left 11/14/2019   Procedure: LEFT TOTAL HIP ARTHROPLASTY ANTERIOR APPROACH;  Surgeon: Frederik Pear, MD;  Location: [facility];  Service: Orthopedics;  Laterality: Left;   Family History  Problem Relation Age of Onset   Heart disease Mother        Before age 43   Heart attack Mother    Cancer Mother        lung and  colon   Stroke Mother    Heart failure Father    Heart disease Father        After age 67   Heart attack Father    Hyperlipidemia Father    Healthy Maternal Grandmother    Healthy Maternal Grandfather    Diabetes Paternal Grandmother    Healthy Paternal Grandfather    Hypertension Neg Hx    Social History   Socioeconomic History   Marital status: Divorced    Spouse name: Not on file   Number of children: 0   Years of education: 12   Highest education level: Not on file  Occupational History   Occupation: Retired  Tobacco Use   Smoking status: Former    Packs/day: 2.00    Years: 50.00    Total pack years: 100.00    Types: Cigarettes    Quit date: 01/24/2015    Years since quitting: 7.7   Smokeless tobacco: Never  Vaping Use   Vaping Use: Never used  Substance and Sexual Activity   Alcohol use: Not Currently    Alcohol/week: 3.0 - 4.0 standard drinks of  alcohol    Types: 3 - 4 Shots of liquor per week   Drug use: No   Sexual activity: Not Currently  Other Topics Concern   Not on file  Social History Narrative   Fun: Trade out work, outdoors   Denies religious beliefs effecting health care.    Social Determinants of Health   Financial Resource Strain: Low Risk  (10/09/2022)   Overall Financial Resource Strain (CARDIA)    Difficulty of Paying Living Expenses: Not hard at all  Food Insecurity: No Food Insecurity (10/09/2022)   Hunger Vital Sign    Worried About Running Out of Food in the Last Year: Never true    Ran Out of Food in the Last Year: Never true  Transportation Needs: No Transportation Needs (10/09/2022)   PRAPARE - Hydrologist (Medical): No    Lack of Transportation (Non-Medical): No  Physical Activity: Inactive (10/09/2022)   Exercise Vital Sign    Days of Exercise per Week: 0 days    Minutes of Exercise per Session: 0 min  Stress: No Stress Concern Present (10/09/2022)   [place]    Feeling of Stress : Not at all  Social Connections: Socially Isolated (10/09/2022)   Social Connection and Isolation Panel [NHANES]    Frequency of Communication with Friends and Family: More than three times a week    Frequency of Social Gatherings with Friends and Family: More than three times a week    Attends Religious Services: Never    Marine scientist or Organizations: No    Attends Music therapist: Never    Marital Status: Divorced    Tobacco Counseling Counseling given: Not Answered   Clinical Intake: Pre-visit preparation completed: Yes  Pain Score: 0-No pain        How often do you need to have someone help you when you read instructions, pamphlets, or other written materials from your doctor or pharmacy?: 1 - Never  Diabetic?no  Interpreter Needed?: No      Activities of Daily Living     10/09/2022    1:59 PM 02/04/2022   10:50 AM  In your present state of health, do you have any difficulty performing the following activities:  Hearing? 0 0  Vision? 0 0  Difficulty concentrating  or making decisions? 0 0  Walking or climbing stairs? 0 0  Dressing or bathing? 0 0  Doing errands, shopping? 0 0  Preparing Food and eating ? N   Using the Toilet? N   In the past six months, have you accidently leaked urine? N   Do you have problems with loss of bowel control? N   Managing your Medications? N   Managing your Finances? N   Housekeeping or managing your Housekeeping? N     Patient Care Team: Velva Harman, PA as PCP - General (Family Medicine) Lorretta Harp, MD as Consulting Physician (Cardiology) Estanislado Emms, MD (Inactive) as Consulting Physician (Nephrology) Evans Lance, MD as Consulting Physician (Clinical Cardiac Electrophysiology) Comer, Okey Regal, MD as Consulting Physician (Infectious Diseases) Sharmon Revere as Physician Assistant (Cardiology) Dillingham, Loel Lofty, DO as Attending Physician (Plastic Surgery)  Indicate any recent Medical Services you may have received from other than [facility] providers in the past year (date may be approximate).     Assessment:   This is a routine wellness examination for [place].  Hearing/Vision screen Hearing Screening - Comments:: Does not see anyone for hearing Vision Screening - Comments:: Pt does not see eye doctor  Dietary issues and exercise activities discussed:     Goals Addressed   None   Depression Screen    10/09/2022    2:02 PM 10/09/2022    1:58 PM 03/05/2022   11:14 AM 02/04/2022   10:50 AM 01/07/2022   10:47 AM 09/04/2021   10:14 AM 07/01/2021   10:55 AM  PHQ 2/9 Scores  PHQ - 2 Score 0 0 0 0 0 0 1  PHQ- 9 Score   0 0 0 0 2    Fall Risk    10/09/2022    1:54 PM 02/04/2022   10:50 AM 01/07/2022   10:47 AM 09/04/2021   10:14 AM 07/01/2021   10:55 AM  Fall Risk   Falls in the past  year? 0 0 0 0 0  Number falls in past yr: 0 0 0 0 0  Injury with Fall? 0 0 0 0 0  Risk for fall due to :  No Fall Risks No Fall Risks No Fall Risks No Fall Risks  Follow up Falls evaluation completed Falls evaluation completed Falls evaluation completed Falls evaluation completed Falls evaluation completed    [place]:  Any stairs in or around the home? Yes  If so, are there any without handrails? No  Home free of loose throw rugs in walkways, pet beds, electrical cords, etc? Yes  Adequate lighting in your home to reduce risk of falls? Yes   ASSISTIVE DEVICES UTILIZED TO PREVENT FALLS:  Life alert? No  Use of a cane, walker or w/c? No  Grab bars in the bathroom? Yes  Shower chair or bench in shower? Yes  Elevated toilet seat or a handicapped toilet? Yes   TIMED UP AND GO:  Was the test performed? Yes .  Length of time to ambulate 10 feet: 10 sec.   Gait steady and fast without use of assistive device  Cognitive Function:    01/31/2015   12:20 PM  MMSE - Mini Mental State Exam  Orientation to time 5  Orientation to Place 5  Registration 3  Attention/ Calculation 5  Recall 3  Language- name 2 objects 2  Language- repeat 1  Language- follow 3 step command 3  Language- read &  follow direction 1  Write a sentence 1  Copy design 1  Total score 30        10/09/2022    2:00 PM 07/01/2021   10:45 AM 02/17/2020    9:36 AM 08/25/2019   10:00 AM 04/29/2017    9:46 AM  6CIT Screen  What Year? 0 points 0 points 0 points 0 points 0 points  What month? 0 points 0 points 0 points 0 points 0 points  What time? 0 points 0 points 0 points 0 points 0 points  Count back from 20 0 points 0 points 0 points 0 points 0 points  Months in reverse 0 points 0 points 0 points 0 points 0 points  Repeat phrase 0 points 0 points 0 points 4 points 0 points  Total Score 0 points 0 points 0 points 4 points 0 points    Immunizations Immunization History   Administered Date(s) Administered   Hep A / Hep B 04/29/2017   IPV 12/23/1954, 01/20/1955, 09/15/1957   Pneumococcal Conjugate-13 04/29/2017   Pneumococcal Polysaccharide-23 08/23/2015   Tdap 01/31/2015    TDAP status: Due, Education has been provided regarding the importance of this vaccine. Advised may receive this vaccine at local pharmacy or Health Dept. Aware to provide a copy of the vaccination record if obtained from local pharmacy or Health Dept. Verbalized acceptance and understanding.  Flu Vaccine status: Declined, Education has been provided regarding the importance of this vaccine but patient still declined. Advised may receive this vaccine at local pharmacy or Health Dept. Aware to provide a copy of the vaccination record if obtained from local pharmacy or Health Dept. Verbalized acceptance and understanding.  Pneumococcal vaccine status: Declined,  Education has been provided regarding the importance of this vaccine but patient still declined. Advised may receive this vaccine at local pharmacy or Health Dept. Aware to provide a copy of the vaccination record if obtained from local pharmacy or Health Dept. Verbalized acceptance and understanding.   Covid-19 vaccine status: Declined, Education has been provided regarding the importance of this vaccine but patient still declined. Advised may receive this vaccine at local pharmacy or Health Dept.or vaccine clinic. Aware to provide a copy of the vaccination record if obtained from local pharmacy or Health Dept. Verbalized acceptance and understanding.  Qualifies for Shingles Vaccine? Yes   Zostavax completed No   Shingrix Completed?: No.    Education has been provided regarding the importance of this vaccine. Patient has been advised to call insurance company to determine out of pocket expense if they have not yet received this vaccine. Advised may also receive vaccine at local pharmacy or Health Dept. Verbalized acceptance and  understanding.  Screening Tests Health Maintenance  Topic Date Due   COVID-19 Vaccine (1) Never done   Zoster Vaccines- Shingrix (1 of 2) Never done   COLONOSCOPY (Pts 45-68yr Insurance coverage will need to be confirmed)  Never done   Lung Cancer Screening  12/20/2020   INFLUENZA VACCINE  Never done   Medicare Annual Wellness (AWV)  10/09/2023   DTaP/Tdap/Td (2 - Td or Tdap) 01/30/2025   Pneumonia Vaccine 72 Years old  Completed   Hepatitis C Screening  Completed   HPV VACCINES  Aged Out    Health Maintenance  Health Maintenance Due  Topic Date Due   COVID-19 Vaccine (1) Never done   Zoster Vaccines- Shingrix (1 of 2) Never done   COLONOSCOPY (Pts 45-426yrInsurance coverage will need to be confirmed)  Never done  Lung Cancer Screening  12/20/2020   INFLUENZA VACCINE  Never done    Colorectal cancer screening: No longer required. Declined previous colorectal cancer screenings.  Lung Cancer Screening: (Low Dose CT Chest recommended if Age 27-80 years, 30 pack-year currently smoking OR have quit w/in 15years.) does qualify.  Declines screening.  Education provided.  Lung Cancer Screening Referral: yes  Additional Screening:  Hepatitis C Screening: does qualify; Completed 04/29/2017  Vision Screening: Recommended annual ophthalmology exams for early detection of glaucoma and other disorders of the eye. Is the patient up to date with their annual eye exam?  No  Who is the provider or what is the name of the office in which the patient attends annual eye exams? Does not go to eye doctor If pt is not established with a provider, would they like to be referred to a provider to establish care? No .   Dental Screening: Recommended annual dental exams for proper oral hygiene  Community Resource Referral / Chronic Care Management: CRR required this visit?  No   CCM required this visit?  No      Plan:    Declines all screenings including colon cancer screening and lung  cancer screening.  We discussed the importance and advantages of screenings, but the patient would prefer not to have either done. Collecting routine lab work today as he has not had any blood work since May 2023. We discussed the importance of continuing to take his medications especially his blood pressure medications as prescribed.  I have personally reviewed and noted the following in the patient's chart:   Medical and social history Use of alcohol, tobacco or illicit drugs  Current medications and supplements including opioid prescriptions. Patient is not currently taking opioid prescriptions. Functional ability and status Nutritional status Physical activity Advanced directives List of other physicians Hospitalizations, surgeries, and ER visits in previous 12 months Vitals Screenings to include cognitive, depression, and falls Referrals and appointments  In addition, I have reviewed and discussed with patient certain preventive protocols, quality metrics, and best practice recommendations. A written personalized care plan for preventive services as well as general preventive health recommendations were provided to patient.    Velva Harman, PA   10/09/2022   Nurse Notes: Face to Face 20 minutes

## 2022-10-09 NOTE — Progress Notes (Signed)
Subjective:   George [name] is a 72 y.o. male who presents for Medicare Annual/Subsequent preventive examination.  Review of Systems    Review of Systems  All other systems reviewed and are negative.         Objective:    Today's Vitals   10/09/22 1352  BP: (!) 147/97  Pulse: 85  SpO2: 94%  Weight: 230 lb (104.3 kg)  Height: 6' 2.02" (1.88 m)  PainSc: 0-No pain   Body mass index is 29.51 kg/m.     10/09/2022    1:59 PM 12/23/2019    8:44 AM 12/22/2019    8:22 PM 11/17/2019    8:30 AM 11/14/2019    7:12 AM 11/08/2019   11:16 AM 04/29/2017    9:34 AM  Advanced Directives  Does Patient Have a Medical Advance Directive? Yes Yes No Yes Yes Yes Yes  Type of Corporate treasurer of [place];Living will  [place];Living will [place];Living will Living will;Healthcare Power of [place];Living will  Does patient want to make changes to medical advance directive?  No - Patient declined  No - Patient declined     Copy of [place] in Chart?  No - copy requested         Current Medications (verified) Outpatient Encounter Medications as of 10/09/2022  Medication Sig   amLODipine (NORVASC) 5 MG tablet TAKE 1 TABLET BY MOUTH DAILY.   aspirin EC 81 MG tablet Take 1 tablet (81 mg total) by mouth 2 (two) times daily.   DULoxetine (CYMBALTA) 30 MG capsule TAKE 1 CAPSULE (30 MG TOTAL) BY MOUTH DAILY.   losartan (COZAAR) 50 MG tablet Take 1 tablet (50 mg total) by mouth daily.   No facility-administered encounter medications on file as of 10/09/2022.    Allergies (verified) Patient has no known allergies.   History: Past Medical History:  Diagnosis Date   AAA (abdominal aortic aneurysm) ([place])    a. [place] 09/2015 - 3cm distal AAA. //  b. [place] 5/17: 3.1 cm    Basal cell carcinoma of right side of nose    Berger's disease    CKD (chronic kidney disease) 09/2015   COPD (chronic obstructive  pulmonary disease) ([facility])    GERD (gastroesophageal reflux disease)    Nephrotic syndrome    Panic attacks    Sleep apnea    SVT (supraventricular tachycardia) 05/22/2015   Past Surgical History:  Procedure Laterality Date   BASAL CELL CARCINOMA EXCISION Right ~ 06/2015   "side of my nose"   ELECTROPHYSIOLOGIC STUDY N/A 09/10/2015   Procedure: SVT Ablation;  Surgeon: Evans Lance, MD;  Location: [facility];  Service: Cardiovascular;  Laterality: N/A;   FEMUR FRACTURE SURGERY Right 1970   "had plate & pin put in"   [place] Right 1971   "removed plate; left pin in   FRACTURE SURGERY     KNEE ARTHROSCOPY Right ~ [place]  ~ [place] Left 11/14/2019   Procedure: LEFT TOTAL HIP ARTHROPLASTY ANTERIOR APPROACH;  Surgeon: Frederik Pear, MD;  Location: [facility];  Service: Orthopedics;  Laterality: Left;   Family History  Problem Relation Age of Onset   Heart disease Mother        Before age 89   Heart attack Mother    Cancer Mother        lung and  colon   Stroke Mother    Heart failure Father    Heart disease Father        After age 42   Heart attack Father    Hyperlipidemia Father    Healthy Maternal Grandmother    Healthy Maternal Grandfather    Diabetes Paternal Grandmother    Healthy Paternal Grandfather    Hypertension Neg Hx    Social History   Socioeconomic History   Marital status: Divorced    Spouse name: Not on file   Number of children: 0   Years of education: 12   Highest education level: Not on file  Occupational History   Occupation: Retired  Tobacco Use   Smoking status: Former    Packs/day: 2.00    Years: 50.00    Total pack years: 100.00    Types: Cigarettes    Quit date: 01/24/2015    Years since quitting: 7.7   Smokeless tobacco: Never  Vaping Use   Vaping Use: Never used  Substance and Sexual Activity   Alcohol use: Not Currently    Alcohol/week: 3.0 - 4.0 standard drinks of  alcohol    Types: 3 - 4 Shots of liquor per week   Drug use: No   Sexual activity: Not Currently  Other Topics Concern   Not on file  Social History Narrative   Fun: Trade out work, outdoors   Denies religious beliefs effecting health care.    Social Determinants of Health   Financial Resource Strain: Low Risk  (10/09/2022)   Overall Financial Resource Strain (CARDIA)    Difficulty of Paying Living Expenses: Not hard at all  Food Insecurity: No Food Insecurity (10/09/2022)   Hunger Vital Sign    Worried About Running Out of Food in the Last Year: Never true    Ran Out of Food in the Last Year: Never true  Transportation Needs: No Transportation Needs (10/09/2022)   PRAPARE - Hydrologist (Medical): No    Lack of Transportation (Non-Medical): No  Physical Activity: Inactive (10/09/2022)   Exercise Vital Sign    Days of Exercise per Week: 0 days    Minutes of Exercise per Session: 0 min  Stress: No Stress Concern Present (10/09/2022)   [place]    Feeling of Stress : Not at all  Social Connections: Socially Isolated (10/09/2022)   Social Connection and Isolation Panel [NHANES]    Frequency of Communication with Friends and Family: More than three times a week    Frequency of Social Gatherings with Friends and Family: More than three times a week    Attends Religious Services: Never    Marine scientist or Organizations: No    Attends Music therapist: Never    Marital Status: Divorced    Tobacco Counseling Counseling given: Not Answered   Clinical Intake: Pre-visit preparation completed: Yes  Pain Score: 0-No pain        How often do you need to have someone help you when you read instructions, pamphlets, or other written materials from your doctor or pharmacy?: 1 - Never  Diabetic?no  Interpreter Needed?: No      Activities of Daily Living     10/09/2022    1:59 PM 02/04/2022   10:50 AM  In your present state of health, do you have any difficulty performing the following activities:  Hearing? 0 0  Vision? 0 0  Difficulty concentrating  or making decisions? 0 0  Walking or climbing stairs? 0 0  Dressing or bathing? 0 0  Doing errands, shopping? 0 0  Preparing Food and eating ? N   Using the Toilet? N   In the past six months, have you accidently leaked urine? N   Do you have problems with loss of bowel control? N   Managing your Medications? N   Managing your Finances? N   Housekeeping or managing your Housekeeping? N     Patient Care Team: Velva Harman, PA as PCP - General (Family Medicine) Lorretta Harp, MD as Consulting Physician (Cardiology) Estanislado Emms, MD (Inactive) as Consulting Physician (Nephrology) Evans Lance, MD as Consulting Physician (Clinical Cardiac Electrophysiology) Comer, Okey Regal, MD as Consulting Physician (Infectious Diseases) Sharmon Revere as Physician Assistant (Cardiology) Dillingham, Loel Lofty, DO as Attending Physician (Plastic Surgery)  Indicate any recent Medical Services you may have received from other than [facility] providers in the past year (date may be approximate).     Assessment:   This is a routine wellness examination for [place].  Hearing/Vision screen Hearing Screening - Comments:: Does not see anyone for hearing Vision Screening - Comments:: Pt does not see eye doctor  Dietary issues and exercise activities discussed:     Goals Addressed   None   Depression Screen    10/09/2022    2:02 PM 10/09/2022    1:58 PM 03/05/2022   11:14 AM 02/04/2022   10:50 AM 01/07/2022   10:47 AM 09/04/2021   10:14 AM 07/01/2021   10:55 AM  PHQ 2/9 Scores  PHQ - 2 Score 0 0 0 0 0 0 1  PHQ- 9 Score   0 0 0 0 2    Fall Risk    10/09/2022    1:54 PM 02/04/2022   10:50 AM 01/07/2022   10:47 AM 09/04/2021   10:14 AM 07/01/2021   10:55 AM  Fall Risk   Falls in the past  year? 0 0 0 0 0  Number falls in past yr: 0 0 0 0 0  Injury with Fall? 0 0 0 0 0  Risk for fall due to :  No Fall Risks No Fall Risks No Fall Risks No Fall Risks  Follow up Falls evaluation completed Falls evaluation completed Falls evaluation completed Falls evaluation completed Falls evaluation completed    [place]:  Any stairs in or around the home? Yes  If so, are there any without handrails? No  Home free of loose throw rugs in walkways, pet beds, electrical cords, etc? Yes  Adequate lighting in your home to reduce risk of falls? Yes   ASSISTIVE DEVICES UTILIZED TO PREVENT FALLS:  Life alert? No  Use of a cane, walker or w/c? No  Grab bars in the bathroom? Yes  Shower chair or bench in shower? Yes  Elevated toilet seat or a handicapped toilet? Yes   TIMED UP AND GO:  Was the test performed? Yes .  Length of time to ambulate 10 feet: 10 sec.   Gait steady and fast without use of assistive device  Cognitive Function:    01/31/2015   12:20 PM  MMSE - Mini Mental State Exam  Orientation to time 5  Orientation to Place 5  Registration 3  Attention/ Calculation 5  Recall 3  Language- name 2 objects 2  Language- repeat 1  Language- follow 3 step command 3  Language- read &  follow direction 1  Write a sentence 1  Copy design 1  Total score 30        10/09/2022    2:00 PM 07/01/2021   10:45 AM 02/17/2020    9:36 AM 08/25/2019   10:00 AM 04/29/2017    9:46 AM  6CIT Screen  What Year? 0 points 0 points 0 points 0 points 0 points  What month? 0 points 0 points 0 points 0 points 0 points  What time? 0 points 0 points 0 points 0 points 0 points  Count back from 20 0 points 0 points 0 points 0 points 0 points  Months in reverse 0 points 0 points 0 points 0 points 0 points  Repeat phrase 0 points 0 points 0 points 4 points 0 points  Total Score 0 points 0 points 0 points 4 points 0 points    Immunizations Immunization History   Administered Date(s) Administered   Hep A / Hep B 04/29/2017   IPV 12/23/1954, 01/20/1955, 09/15/1957   Pneumococcal Conjugate-13 04/29/2017   Pneumococcal Polysaccharide-23 08/23/2015   Tdap 01/31/2015    TDAP status: Due, Education has been provided regarding the importance of this vaccine. Advised may receive this vaccine at local pharmacy or Health Dept. Aware to provide a copy of the vaccination record if obtained from local pharmacy or Health Dept. Verbalized acceptance and understanding.  Flu Vaccine status: Declined, Education has been provided regarding the importance of this vaccine but patient still declined. Advised may receive this vaccine at local pharmacy or Health Dept. Aware to provide a copy of the vaccination record if obtained from local pharmacy or Health Dept. Verbalized acceptance and understanding.  Pneumococcal vaccine status: Declined,  Education has been provided regarding the importance of this vaccine but patient still declined. Advised may receive this vaccine at local pharmacy or Health Dept. Aware to provide a copy of the vaccination record if obtained from local pharmacy or Health Dept. Verbalized acceptance and understanding.   Covid-19 vaccine status: Declined, Education has been provided regarding the importance of this vaccine but patient still declined. Advised may receive this vaccine at local pharmacy or Health Dept.or vaccine clinic. Aware to provide a copy of the vaccination record if obtained from local pharmacy or Health Dept. Verbalized acceptance and understanding.  Qualifies for Shingles Vaccine? Yes   Zostavax completed No   Shingrix Completed?: No.    Education has been provided regarding the importance of this vaccine. Patient has been advised to call insurance company to determine out of pocket expense if they have not yet received this vaccine. Advised may also receive vaccine at local pharmacy or Health Dept. Verbalized acceptance and  understanding.  Screening Tests Health Maintenance  Topic Date Due   COVID-19 Vaccine (1) Never done   Zoster Vaccines- Shingrix (1 of 2) Never done   COLONOSCOPY (Pts 45-42yr Insurance coverage will need to be confirmed)  Never done   Lung Cancer Screening  12/20/2020   INFLUENZA VACCINE  Never done   Medicare Annual Wellness (AWV)  10/09/2023   DTaP/Tdap/Td (2 - Td or Tdap) 01/30/2025   Pneumonia Vaccine 72 Years old  Completed   Hepatitis C Screening  Completed   HPV VACCINES  Aged Out    Health Maintenance  Health Maintenance Due  Topic Date Due   COVID-19 Vaccine (1) Never done   Zoster Vaccines- Shingrix (1 of 2) Never done   COLONOSCOPY (Pts 45-463yrInsurance coverage will need to be confirmed)  Never done  Lung Cancer Screening  12/20/2020   INFLUENZA VACCINE  Never done    Colorectal cancer screening: No longer required.   Lung Cancer Screening: (Low Dose CT Chest recommended if Age 39-80 years, 30 pack-year currently smoking OR have quit w/in 15years.) does qualify.   Lung Cancer Screening Referral: yes  Additional Screening:  Hepatitis C Screening: does qualify; Completed 04/29/2017  Vision Screening: Recommended annual ophthalmology exams for early detection of glaucoma and other disorders of the eye. Is the patient up to date with their annual eye exam?  No  Who is the provider or what is the name of the office in which the patient attends annual eye exams? Does not go to eye doctor If pt is not established with a provider, would they like to be referred to a provider to establish care? No .   Dental Screening: Recommended annual dental exams for proper oral hygiene  Community Resource Referral / Chronic Care Management: CRR required this visit?  No   CCM required this visit?  No      Plan:     I have personally reviewed and noted the following in the patient's chart:   Medical and social history Use of alcohol, tobacco or illicit drugs   Current medications and supplements including opioid prescriptions. Patient is not currently taking opioid prescriptions. Functional ability and status Nutritional status Physical activity Advanced directives List of other physicians Hospitalizations, surgeries, and ER visits in previous 12 months Vitals Screenings to include cognitive, depression, and falls Referrals and appointments  In addition, I have reviewed and discussed with patient certain preventive protocols, quality metrics, and best practice recommendations. A written personalized care plan for preventive services as well as general preventive health recommendations were provided to patient.     Velva Harman, PA   10/09/2022   Nurse Notes: Face to Face 20 minutes

## 2022-10-09 NOTE — Assessment & Plan Note (Signed)
Patient has declined medication therapy and low-dose chest CT for lung cancer screening.

## 2022-10-09 NOTE — Telephone Encounter (Signed)
Refill of amlodipine sent to pharmacy.  Per provider 90 day supply with one refill.

## 2022-10-10 ENCOUNTER — Other Ambulatory Visit: Payer: Self-pay | Admitting: Family Medicine

## 2022-10-10 DIAGNOSIS — R7989 Other specified abnormal findings of blood chemistry: Secondary | ICD-10-CM

## 2022-10-10 DIAGNOSIS — R5383 Other fatigue: Secondary | ICD-10-CM

## 2022-10-10 DIAGNOSIS — E559 Vitamin D deficiency, unspecified: Secondary | ICD-10-CM

## 2022-10-10 LAB — LIPID PANEL
Chol/HDL Ratio: 4.1 ratio (ref 0.0–5.0)
Cholesterol, Total: 169 mg/dL (ref 100–199)
HDL: 41 mg/dL (ref >39)
LDL Chol Calc (NIH): 107 mg/dL — ABNORMAL HIGH (ref 0–99)
Triglycerides: 115 mg/dL (ref 0–149)
VLDL Cholesterol Cal: 21 mg/dL (ref 5–40)

## 2022-10-10 LAB — COMPREHENSIVE METABOLIC PANEL
ALT: 13 IU/L (ref 0–44)
AST: 16 IU/L (ref 0–40)
Albumin/Globulin Ratio: 2.1 (ref 1.2–2.2)
Albumin: 4.6 g/dL (ref 3.8–4.8)
Alkaline Phosphatase: 81 IU/L (ref 44–121)
BUN/Creatinine Ratio: 15 (ref 10–24)
BUN: 14 mg/dL (ref 8–27)
Bilirubin Total: 0.4 mg/dL (ref 0.0–1.2)
CO2: 21 mmol/L (ref 20–29)
Calcium: 9.3 mg/dL (ref 8.6–10.2)
Chloride: 100 mmol/L (ref 96–106)
Creatinine, Ser: 0.94 mg/dL (ref 0.76–1.27)
Globulin, Total: 2.2 g/dL (ref 1.5–4.5)
Glucose: 91 mg/dL (ref 70–99)
Potassium: 4.2 mmol/L (ref 3.5–5.2)
Sodium: 138 mmol/L (ref 134–144)
Total Protein: 6.8 g/dL (ref 6.0–8.5)
eGFR: 86 mL/min/{1.73_m2} (ref >59)

## 2022-10-10 LAB — CBC WITH DIFFERENTIAL/PLATELET
Basophils Absolute: 0.1 10*3/uL (ref 0.0–0.2)
Basos: 1 %
EOS (ABSOLUTE): 0.2 10*3/uL (ref 0.0–0.4)
Eos: 3 %
Hematocrit: 53.7 % — ABNORMAL HIGH (ref 37.5–51.0)
Hemoglobin: 18.4 g/dL — ABNORMAL HIGH (ref 13.0–17.7)
Immature Grans (Abs): 0.1 10*3/uL (ref 0.0–0.1)
Immature Granulocytes: 1 %
Lymphocytes Absolute: 1.3 10*3/uL (ref 0.7–3.1)
Lymphs: 18 %
MCH: 30.9 pg (ref 26.6–33.0)
MCHC: 34.3 g/dL (ref 31.5–35.7)
MCV: 90 fL (ref 79–97)
Monocytes Absolute: 0.5 10*3/uL (ref 0.1–0.9)
Monocytes: 7 %
Neutrophils Absolute: 5.1 10*3/uL (ref 1.4–7.0)
Neutrophils: 70 %
Platelets: 235 10*3/uL (ref 150–450)
RBC: 5.95 x10E6/uL — ABNORMAL HIGH (ref 4.14–5.80)
RDW: 13.2 % (ref 11.6–15.4)
WBC: 7.2 10*3/uL (ref 3.4–10.8)

## 2022-10-10 LAB — HEMOGLOBIN A1C
Est. average glucose Bld gHb Est-mCnc: 114 mg/dL
Hgb A1c MFr Bld: 5.6 % (ref 4.8–5.6)

## 2022-10-10 LAB — TSH: TSH: 6.24 u[IU]/mL — ABNORMAL HIGH (ref 0.450–4.500)

## 2022-10-10 LAB — VITAMIN D 25 HYDROXY (VIT D DEFICIENCY, FRACTURES): Vit D, 25-Hydroxy: 22.8 ng/mL — ABNORMAL LOW (ref 30.0–100.0)

## 2022-10-10 MED ORDER — VITAMIN D (ERGOCALCIFEROL) 1.25 MG (50000 UNIT) PO CAPS: 50000.0000 [IU] | ORAL_CAPSULE | ORAL | 0 refills | Status: AC

## 2022-10-10 NOTE — Addendum Note (Signed)
Addended by: Virgil Benedict on: 10/10/2022 12:07 PM   Modules accepted: Orders

## 2022-10-13 ENCOUNTER — Other Ambulatory Visit: Payer: Medicare Other

## 2022-10-13 DIAGNOSIS — R5383 Other fatigue: Secondary | ICD-10-CM | POA: Diagnosis not present

## 2022-10-13 DIAGNOSIS — R7989 Other specified abnormal findings of blood chemistry: Secondary | ICD-10-CM | POA: Diagnosis not present

## 2022-10-15 ENCOUNTER — Other Ambulatory Visit: Payer: Self-pay | Admitting: Family Medicine

## 2022-10-15 DIAGNOSIS — E039 Hypothyroidism, unspecified: Secondary | ICD-10-CM

## 2022-10-15 LAB — T3, FREE: T3, Free: 2.6 pg/mL (ref 2.0–4.4)

## 2022-10-15 LAB — T4, FREE: Free T4: 0.75 ng/dL — ABNORMAL LOW (ref 0.82–1.77)

## 2022-10-15 MED ORDER — LEVOTHYROXINE SODIUM 25 MCG PO TABS: 25.0000 ug | ORAL_TABLET | Freq: Every day | ORAL | 2 refills | Status: DC

## 2022-10-28 ENCOUNTER — Other Ambulatory Visit: Payer: Self-pay | Admitting: Family Medicine

## 2022-10-28 DIAGNOSIS — F411 Generalized anxiety disorder: Secondary | ICD-10-CM

## 2022-11-05 ENCOUNTER — Other Ambulatory Visit: Payer: Self-pay

## 2022-11-05 DIAGNOSIS — R7989 Other specified abnormal findings of blood chemistry: Secondary | ICD-10-CM

## 2022-11-05 DIAGNOSIS — E039 Hypothyroidism, unspecified: Secondary | ICD-10-CM

## 2022-11-05 DIAGNOSIS — I10 Essential (primary) hypertension: Secondary | ICD-10-CM

## 2022-11-05 DIAGNOSIS — R5383 Other fatigue: Secondary | ICD-10-CM

## 2022-11-05 DIAGNOSIS — I151 Hypertension secondary to other renal disorders: Secondary | ICD-10-CM

## 2022-11-12 ENCOUNTER — Other Ambulatory Visit: Payer: Self-pay

## 2022-11-12 ENCOUNTER — Other Ambulatory Visit: Payer: Self-pay | Admitting: Family Medicine

## 2022-11-12 ENCOUNTER — Other Ambulatory Visit: Payer: Medicare Other

## 2022-11-12 DIAGNOSIS — E039 Hypothyroidism, unspecified: Secondary | ICD-10-CM | POA: Diagnosis not present

## 2022-11-12 DIAGNOSIS — R7989 Other specified abnormal findings of blood chemistry: Secondary | ICD-10-CM | POA: Diagnosis not present

## 2022-11-12 DIAGNOSIS — I151 Hypertension secondary to other renal disorders: Secondary | ICD-10-CM | POA: Diagnosis not present

## 2022-11-12 DIAGNOSIS — I10 Essential (primary) hypertension: Secondary | ICD-10-CM | POA: Diagnosis not present

## 2022-11-12 DIAGNOSIS — R5383 Other fatigue: Secondary | ICD-10-CM

## 2022-11-13 ENCOUNTER — Other Ambulatory Visit: Payer: Self-pay | Admitting: Nurse Practitioner

## 2022-11-13 DIAGNOSIS — I151 Hypertension secondary to other renal disorders: Secondary | ICD-10-CM

## 2022-11-13 DIAGNOSIS — N02B9 Other recurrent and persistent immunoglobulin A nephropathy: Secondary | ICD-10-CM

## 2022-11-13 LAB — CBC WITH DIFFERENTIAL/PLATELET
Basophils Absolute: 0.1 10*3/uL (ref 0.0–0.2)
Basos: 1 %
EOS (ABSOLUTE): 0.3 10*3/uL (ref 0.0–0.4)
Eos: 4 %
Hematocrit: 54.5 % — ABNORMAL HIGH (ref 37.5–51.0)
Hemoglobin: 18 g/dL — ABNORMAL HIGH (ref 13.0–17.7)
Immature Grans (Abs): 0.1 10*3/uL (ref 0.0–0.1)
Immature Granulocytes: 1 %
Lymphocytes Absolute: 1.4 10*3/uL (ref 0.7–3.1)
Lymphs: 18 %
MCH: 30.1 pg (ref 26.6–33.0)
MCHC: 33 g/dL (ref 31.5–35.7)
MCV: 91 fL (ref 79–97)
Monocytes Absolute: 0.6 10*3/uL (ref 0.1–0.9)
Monocytes: 8 %
Neutrophils Absolute: 5.1 10*3/uL (ref 1.4–7.0)
Neutrophils: 68 %
Platelets: 232 10*3/uL (ref 150–450)
RBC: 5.99 x10E6/uL — ABNORMAL HIGH (ref 4.14–5.80)
RDW: 13 % (ref 11.6–15.4)
WBC: 7.4 10*3/uL (ref 3.4–10.8)

## 2022-11-13 LAB — T3, FREE: T3, Free: 2.5 pg/mL (ref 2.0–4.4)

## 2022-11-13 LAB — T4, FREE: Free T4: 0.88 ng/dL (ref 0.82–1.77)

## 2022-11-13 LAB — TSH: TSH: 3.61 u[IU]/mL (ref 0.450–4.500)

## 2022-11-13 NOTE — Telephone Encounter (Signed)
Refill sent.

## 2022-11-19 ENCOUNTER — Ambulatory Visit (INDEPENDENT_AMBULATORY_CARE_PROVIDER_SITE_OTHER): Payer: Medicare Other | Admitting: Family Medicine

## 2022-11-19 ENCOUNTER — Encounter: Payer: Self-pay | Admitting: Family Medicine

## 2022-11-19 VITALS — BP 131/90 | HR 83 | Resp 18 | Ht 74.02 in | Wt 235.0 lb

## 2022-11-19 DIAGNOSIS — I10 Essential (primary) hypertension: Secondary | ICD-10-CM | POA: Diagnosis not present

## 2022-11-19 DIAGNOSIS — D751 Secondary polycythemia: Secondary | ICD-10-CM

## 2022-11-19 DIAGNOSIS — E039 Hypothyroidism, unspecified: Secondary | ICD-10-CM

## 2022-11-19 NOTE — Progress Notes (Signed)
   Established Patient Office Visit  Subjective   Patient ID: George [name], male    DOB: [birth year]  Age: 72 y.o. MRN: [id number]  Chief Complaint  Patient presents with   Hypothyroidism    HPI George [name] is a 72 y.o. male presenting today for follow up of hypothyroidism. Hypothyroidism: Taking levothyroxine regularly in the AM away from food and vitamins. Denies fatigue, weight changes, heat/cold intolerance, skin/hair changes, bowel changes, CVS symptoms. His lab work has also shown erythrocytosis with increased hemoglobin at 18.0 and 18.4 as well as increase in red blood cells over the past 2 times.  ROS Negative unless otherwise noted in HPI   Objective:     BP (!) 131/90 (BP Location: Left Arm, Patient Position: Sitting, Cuff Size: Large)   Pulse 83   Resp 18   Ht 6' 2.02" (1.88 m)   Wt 235 lb (106.6 kg)   SpO2 95%   BMI 30.16 kg/m   Physical Exam Constitutional:      General: He is not in acute distress.    Appearance: Normal appearance.  HENT:     Head: Normocephalic and atraumatic.  Cardiovascular:     Rate and Rhythm: Normal rate and regular rhythm.     Heart sounds: Normal heart sounds. No murmur heard.    No friction rub. No gallop.  Pulmonary:     Effort: Pulmonary effort is normal. No respiratory distress.     Breath sounds: Wheezing and rhonchi present.     Comments: Inspiratory and expiratory adventitious lung sounds Skin:    General: Skin is warm and dry.  Neurological:     Mental Status: He is alert and oriented to person, place, and time.  Psychiatric:        Mood and Affect: Mood normal.     Assessment & Plan:  Hypothyroidism, unspecified type Assessment & Plan: Thyroid function labs within normal limits on most recent check.  Continue levothyroxine 25 mcg daily.  Will continue to monitor.   Essential hypertension Assessment & Plan: Blood pressure elevated on intake 146/94, decreased to 131/90 on repeat.  Patient states that when  he checks it at home systolic can vary from 127 to somewhere in the 140s, diastolic is consistently above 90.  We discussed the importance of blood pressure goals less than 130/80 due to comorbidities.  He will keep a blood pressure log at home for the next 2 weeks and come back in for a blood pressure visit with the nurse.  If blood pressure remains above goal, will increase losartan to 100 mg daily.  Patient verbalized understanding and is agreeable to this plan.   Erythrocytosis Assessment & Plan: Kidney and liver function within normal limits on last CMP/CBC.  Patient does have a history of COPD, but oxygen saturation on pulse ox in office today was 95%.  Obtaining serum EPO, if abnormal will discuss referral to hematology.  If within normal limits, it is possible that is secondary due to hypoxia from COPD and reduced lung function.  Orders: -     Erythropoietin; Future    Return in about 2 weeks (around 12/03/2022) for BP check nurse visit.    Melida Quitter, PA

## 2022-11-19 NOTE — Assessment & Plan Note (Signed)
Kidney and liver function within normal limits on last CMP/CBC.  Patient does have a history of COPD, but oxygen saturation on pulse ox in office today was 95%.  Obtaining serum EPO, if abnormal will discuss referral to hematology.  If within normal limits, it is possible that is secondary due to hypoxia from COPD and reduced lung function.

## 2022-11-19 NOTE — Assessment & Plan Note (Signed)
Thyroid function labs within normal limits on most recent check.  Continue levothyroxine 25 mcg daily.  Will continue to monitor.

## 2022-11-19 NOTE — Assessment & Plan Note (Signed)
Blood pressure elevated on intake 146/94, decreased to 131/90 on repeat.  Patient states that when he checks it at home systolic can vary from 127 to somewhere in the 140s, diastolic is consistently above 90.  We discussed the importance of blood pressure goals less than 130/80 due to comorbidities.  He will keep a blood pressure log at home for the next 2 weeks and come back in for a blood pressure visit with the nurse.  If blood pressure remains above goal, will increase losartan to 100 mg daily.  Patient verbalized understanding and is agreeable to this plan.

## 2022-11-20 LAB — ERYTHROPOIETIN: Erythropoietin: 9 m[IU]/mL (ref 2.6–18.5)

## 2022-11-26 ENCOUNTER — Telehealth: Payer: Self-pay

## 2022-11-26 NOTE — Telephone Encounter (Signed)
Pt called and results was discuss. He understands the results and had no further questions

## 2022-11-27 ENCOUNTER — Other Ambulatory Visit: Payer: Self-pay | Admitting: Family Medicine

## 2022-11-27 DIAGNOSIS — F411 Generalized anxiety disorder: Secondary | ICD-10-CM

## 2022-12-03 ENCOUNTER — Ambulatory Visit (INDEPENDENT_AMBULATORY_CARE_PROVIDER_SITE_OTHER): Payer: Medicare Other | Admitting: Family Medicine

## 2022-12-03 VITALS — BP 117/84 | HR 91 | Temp 94.0°F

## 2022-12-03 DIAGNOSIS — I151 Hypertension secondary to other renal disorders: Secondary | ICD-10-CM

## 2022-12-03 NOTE — Progress Notes (Signed)
Pt denies CP, SOB, dizziness, or heart palpitations. taking meds as directed without problems. Denies med side effects. 5 min spent with pt. 

## 2023-01-07 ENCOUNTER — Other Ambulatory Visit: Payer: Self-pay | Admitting: Family Medicine

## 2023-01-07 DIAGNOSIS — E039 Hypothyroidism, unspecified: Secondary | ICD-10-CM

## 2023-01-22 ENCOUNTER — Other Ambulatory Visit: Payer: Self-pay | Admitting: Family Medicine

## 2023-01-22 DIAGNOSIS — N02B9 Other recurrent and persistent immunoglobulin A nephropathy: Secondary | ICD-10-CM

## 2023-01-22 DIAGNOSIS — I151 Hypertension secondary to other renal disorders: Secondary | ICD-10-CM

## 2023-04-09 ENCOUNTER — Other Ambulatory Visit: Payer: Self-pay | Admitting: Family Medicine

## 2023-04-09 DIAGNOSIS — E039 Hypothyroidism, unspecified: Secondary | ICD-10-CM

## 2023-04-09 DIAGNOSIS — N02B9 Other recurrent and persistent immunoglobulin A nephropathy: Secondary | ICD-10-CM

## 2023-04-09 DIAGNOSIS — I151 Hypertension secondary to other renal disorders: Secondary | ICD-10-CM

## 2023-04-10 ENCOUNTER — Ambulatory Visit: Payer: Medicare Other | Admitting: Family Medicine

## 2023-04-20 ENCOUNTER — Encounter: Payer: Self-pay | Admitting: Family Medicine

## 2023-04-20 ENCOUNTER — Ambulatory Visit: Payer: Medicare Other | Admitting: Family Medicine

## 2023-05-15 ENCOUNTER — Other Ambulatory Visit: Payer: Self-pay | Admitting: Family Medicine

## 2023-05-15 DIAGNOSIS — Z1211 Encounter for screening for malignant neoplasm of colon: Secondary | ICD-10-CM

## 2023-05-15 DIAGNOSIS — Z1212 Encounter for screening for malignant neoplasm of rectum: Secondary | ICD-10-CM

## 2023-05-27 ENCOUNTER — Telehealth: Payer: Self-pay | Admitting: Family Medicine

## 2023-05-27 ENCOUNTER — Telehealth: Payer: Self-pay

## 2023-05-27 DIAGNOSIS — F411 Generalized anxiety disorder: Secondary | ICD-10-CM

## 2023-05-27 NOTE — Telephone Encounter (Signed)
Patient will need an office visit scheduled in order for refill to be sent to pharmacy

## 2023-05-27 NOTE — Telephone Encounter (Signed)
Patient called office stating that he was informed he will need an appointment to get his DULoxetine (CYMBALTA) 30 MG capsule, ok to refill or does patient need appointment, please advise, thanks.

## 2023-05-28 MED ORDER — DULOXETINE HCL 30 MG PO CPEP
30.0000 mg | ORAL_CAPSULE | Freq: Every day | ORAL | 1 refills | Status: DC
Start: 2023-05-28 — End: 2023-07-02

## 2023-05-28 NOTE — Telephone Encounter (Signed)
Refill sent.

## 2023-05-28 NOTE — Telephone Encounter (Signed)
Please refill medication. Pt scheduled appt 06/11/23

## 2023-05-28 NOTE — Addendum Note (Signed)
Addended by: Tonny Bollman on: 05/28/2023 08:51 AM   Modules accepted: Orders

## 2023-06-11 ENCOUNTER — Ambulatory Visit: Payer: Medicare Other | Admitting: Family Medicine

## 2023-07-02 ENCOUNTER — Encounter: Payer: Self-pay | Admitting: Family Medicine

## 2023-07-02 ENCOUNTER — Ambulatory Visit (INDEPENDENT_AMBULATORY_CARE_PROVIDER_SITE_OTHER): Payer: Medicare Other | Admitting: Family Medicine

## 2023-07-02 VITALS — BP 132/92 | HR 87 | Resp 18 | Ht 74.02 in | Wt 235.0 lb

## 2023-07-02 DIAGNOSIS — Z8639 Personal history of other endocrine, nutritional and metabolic disease: Secondary | ICD-10-CM | POA: Diagnosis not present

## 2023-07-02 DIAGNOSIS — R21 Rash and other nonspecific skin eruption: Secondary | ICD-10-CM | POA: Insufficient documentation

## 2023-07-02 DIAGNOSIS — I10 Essential (primary) hypertension: Secondary | ICD-10-CM

## 2023-07-02 DIAGNOSIS — N049 Nephrotic syndrome with unspecified morphologic changes: Secondary | ICD-10-CM | POA: Diagnosis not present

## 2023-07-02 DIAGNOSIS — I151 Hypertension secondary to other renal disorders: Secondary | ICD-10-CM

## 2023-07-02 DIAGNOSIS — N02B9 Other recurrent and persistent immunoglobulin A nephropathy: Secondary | ICD-10-CM | POA: Diagnosis not present

## 2023-07-02 DIAGNOSIS — E785 Hyperlipidemia, unspecified: Secondary | ICD-10-CM | POA: Diagnosis not present

## 2023-07-02 DIAGNOSIS — E039 Hypothyroidism, unspecified: Secondary | ICD-10-CM

## 2023-07-02 DIAGNOSIS — F411 Generalized anxiety disorder: Secondary | ICD-10-CM

## 2023-07-02 MED ORDER — LOSARTAN POTASSIUM 50 MG PO TABS: 50.0000 mg | ORAL_TABLET | Freq: Every day | ORAL | 1 refills | Status: DC

## 2023-07-02 MED ORDER — AMLODIPINE BESYLATE 5 MG PO TABS: 5.0000 mg | ORAL_TABLET | Freq: Every day | ORAL | 1 refills | Status: DC

## 2023-07-02 MED ORDER — DULOXETINE HCL 20 MG PO CPEP: 40.0000 mg | ORAL_CAPSULE | Freq: Every day | ORAL | 3 refills | Status: DC

## 2023-07-02 MED ORDER — HYDROCORTISONE 2.5 % EX OINT: TOPICAL_OINTMENT | Freq: Two times a day (BID) | CUTANEOUS | 1 refills | Status: AC | PRN

## 2023-07-02 NOTE — Progress Notes (Signed)
Established Patient Office Visit  Subjective   Patient ID: George [name], male    DOB: June 05, 1951  Age: 72 y.o. MRN: [id number]  Chief Complaint  Patient presents with   Hypertension   Hypothyroidism   Rash    On face    HPI George [name] is a 72 y.o. male presenting today for follow up of hypertension, hypothyroidism.  He would like to increase his dose of Cymbalta slightly for improved control of anxiety.  He also complains of a waxing and waning rash on his face.  Spots are red and sometimes develop a scaling on them before resolving.  Denies pain, occasionally the spots are itchy.  They do completely resolve between episodes.  He cannot identify any particular triggers.  He states that he was treated in the past with some type of ointment "like Neosporin" but it was ineffective.  He has not seen dermatology and is not interested in doing so because he states that he is not that vain. Hypertension:  Pt denies chest pain, SOB, dizziness, edema, syncope, fatigue or heart palpitations. Taking losartan, reports excellent compliance with treatment. Denies side effects. Hypothyroidism: Taking levothyroxine 25 mcg regularly in the AM away from food and vitamins. Denies fatigue, weight changes, heat/cold intolerance, skin/hair changes, bowel changes, CVS symptoms.   Outpatient Medications Prior to Visit  Medication Sig   aspirin EC 81 MG tablet Take 1 tablet (81 mg total) by mouth 2 (two) times daily.   levothyroxine (SYNTHROID) 25 MCG tablet TAKE 1 TABLET (25 MCG TOTAL) BY MOUTH DAILY.   [DISCONTINUED] amLODipine (NORVASC) 5 MG tablet Take 1 tablet (5 mg total) by mouth daily.   [DISCONTINUED] DULoxetine (CYMBALTA) 30 MG capsule Take 1 capsule (30 mg total) by mouth daily.   [DISCONTINUED] losartan (COZAAR) 50 MG tablet TAKE 1 TABLET (50 MG TOTAL) BY MOUTH DAILY. NEED APPOINTMENT FOR FURTHER REFILLS   No facility-administered medications prior to visit.    ROS Negative unless  otherwise noted in HPI   Objective:     BP (!) 132/92 (BP Location: Left Arm, Patient Position: Sitting, Cuff Size: Normal)   Pulse 87   Resp 18   Ht 6' 2.02" (1.88 m)   Wt 235 lb (106.6 kg)   SpO2 95%   BMI 30.16 kg/m   Physical Exam Constitutional:      General: He is not in acute distress.    Appearance: Normal appearance.  HENT:     Head: Normocephalic and atraumatic.  Cardiovascular:     Rate and Rhythm: Normal rate and regular rhythm.     Heart sounds: Normal heart sounds. No murmur heard.    No friction rub. No gallop.  Pulmonary:     Effort: Pulmonary effort is normal. No respiratory distress.     Breath sounds: Normal breath sounds. No wheezing, rhonchi or rales.  Skin:    General: Skin is warm and dry.     Findings: Rash (Nonscaling erythematous macular spots primarily over right cheek in office today.  Patient endorses similar spots can pop up anywhere on his face.) present.  Neurological:     Mental Status: He is alert and oriented to person, place, and time.  Psychiatric:        Mood and Affect: Mood normal.     Assessment & Plan:  Essential hypertension Assessment & Plan: BP goal <130/80.  Patient slightly above goal today.  Continue checking blood pressure at home, continue losartan 50 mg daily and amlodipine 5  mg daily.  Will continue to monitor.  If blood pressure remains elevated in the future, will increase medication regimen.  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  Dispense: 90 tablet; Refill: 1 -     amLODIPine Besylate; Take 1 tablet (5 mg total) by mouth daily.  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  Dispense: 90 tablet; Refill: 1  Hypothyroidism, unspecified  type Assessment & Plan: Checking thyroid labs today. Continue levothyroxine 25 mcg daily unless lab results warrant change.  Will continue to monitor.  Orders: -     TSH; Future -     T4, free; Future -     T3, free  Hyperlipidemia, unspecified hyperlipidemia type Assessment & Plan: Repeating lipid panel today.  Pending results, may consider starting statin medication versus continuing nonpharmacologic interventions.  Orders: -     Lipid panel; Future  History of elevated glucose -     Hemoglobin A1c; Future  GAD (generalized anxiety disorder) Assessment & Plan: Increase dose slightly to Cymbalta 40 mg daily.  In the future, if needed can increase to 60 mg daily.  Will continue to monitor.  Orders: -     CBC with Differential/Platelet; Future -     Comprehensive metabolic panel; Future -     TSH; Future -     T4, free; Future -     DULoxetine HCl; Take 2 capsules (40 mg total) by mouth daily.  Dispense: 180 capsule; Refill: 3  Rash of face Assessment & Plan: Discussed that given history of basal cell carcinoma, recommend establishing care with dermatology with annual visits in addition to discussing rash on face.  Patient declines, stating that "he is not that vein" and does not see the need to go to dermatology.  Emphasized that concern is to rule out any type of skin cancer, patient still declines referral.  Trial of hydrocortisone cream, if ineffective revisit conversation of referral to dermatology or discussed possibility with Dr. Constance Goltz of obtaining skin sample with primary care even though it is on the face.  Orders: -     Hydrocortisone; Apply topically 2 (two) times daily as needed.  Dispense: 30 g; Refill: 1    Return in about 3 months (around 10/02/2023) for follow-up for HTN, rash.    Melida Quitter, PA

## 2023-07-02 NOTE — Assessment & Plan Note (Signed)
Checking thyroid labs today. Continue levothyroxine 25 mcg daily unless lab results warrant change.  Will continue to monitor.

## 2023-07-02 NOTE — Patient Instructions (Signed)
We will try a steroid cream on the spots on your face to see if it offers any relief.  I do still recommend seeing dermatology to ensure that it is not skin cancer, also let me know if this is something that you change your mind and would like to do.

## 2023-07-02 NOTE — Assessment & Plan Note (Signed)
Discussed that given history of basal cell carcinoma, recommend establishing care with dermatology with annual visits in addition to discussing rash on face.  Patient declines, stating that "he is not that vein" and does not see the need to go to dermatology.  Emphasized that concern is to rule out any type of skin cancer, patient still declines referral.  Trial of hydrocortisone cream, if ineffective revisit conversation of referral to dermatology or discussed possibility with Dr. Constance Goltz of obtaining skin sample with primary care even though it is on the face.

## 2023-07-02 NOTE — Assessment & Plan Note (Signed)
Increase dose slightly to Cymbalta 40 mg daily.  In the future, if needed can increase to 60 mg daily.  Will continue to monitor.

## 2023-07-02 NOTE — Assessment & Plan Note (Signed)
Repeating lipid panel today.  Pending results, may consider starting statin medication versus continuing nonpharmacologic interventions.

## 2023-07-02 NOTE — Assessment & Plan Note (Signed)
BP goal <130/80.  Patient slightly above goal today.  Continue checking blood pressure at home, continue losartan 50 mg daily and amlodipine 5 mg daily.  Will continue to monitor.  If blood pressure remains elevated in the future, will increase medication regimen.

## 2023-07-03 LAB — CBC WITH DIFFERENTIAL/PLATELET
Basophils Absolute: 0.1 10*3/uL (ref 0.0–0.2)
Basos: 1 %
EOS (ABSOLUTE): 0.2 10*3/uL (ref 0.0–0.4)
Eos: 3 %
Hematocrit: 57.2 % — ABNORMAL HIGH (ref 37.5–51.0)
Hemoglobin: 19 g/dL — ABNORMAL HIGH (ref 13.0–17.7)
Immature Grans (Abs): 0 10*3/uL (ref 0.0–0.1)
Immature Granulocytes: 1 %
Lymphocytes Absolute: 1.2 10*3/uL (ref 0.7–3.1)
Lymphs: 14 %
MCH: 30.2 pg (ref 26.6–33.0)
MCHC: 33.2 g/dL (ref 31.5–35.7)
MCV: 91 fL (ref 79–97)
Monocytes Absolute: 0.5 10*3/uL (ref 0.1–0.9)
Monocytes: 7 %
Neutrophils Absolute: 5.9 10*3/uL (ref 1.4–7.0)
Neutrophils: 74 %
Platelets: 242 10*3/uL (ref 150–450)
RBC: 6.3 x10E6/uL — ABNORMAL HIGH (ref 4.14–5.80)
RDW: 12.9 % (ref 11.6–15.4)
WBC: 8 10*3/uL (ref 3.4–10.8)

## 2023-07-03 LAB — COMPREHENSIVE METABOLIC PANEL
ALT: 12 [IU]/L (ref 0–44)
AST: 22 [IU]/L (ref 0–40)
Albumin: 4.7 g/dL (ref 3.8–4.8)
Alkaline Phosphatase: 91 [IU]/L (ref 44–121)
BUN/Creatinine Ratio: 16 (ref 10–24)
BUN: 16 mg/dL (ref 8–27)
Bilirubin Total: 0.4 mg/dL (ref 0.0–1.2)
CO2: 22 mmol/L (ref 20–29)
Calcium: 9.4 mg/dL (ref 8.6–10.2)
Chloride: 99 mmol/L (ref 96–106)
Creatinine, Ser: 1.02 mg/dL (ref 0.76–1.27)
Globulin, Total: 3 g/dL (ref 1.5–4.5)
Glucose: 96 mg/dL (ref 70–99)
Potassium: 4.8 mmol/L (ref 3.5–5.2)
Sodium: 137 mmol/L (ref 134–144)
Total Protein: 7.7 g/dL (ref 6.0–8.5)
eGFR: 77 mL/min/{1.73_m2} (ref >59)

## 2023-07-03 LAB — HEMOGLOBIN A1C
Est. average glucose Bld gHb Est-mCnc: 117 mg/dL
Hgb A1c MFr Bld: 5.7 % — ABNORMAL HIGH (ref 4.8–5.6)

## 2023-07-03 LAB — LIPID PANEL
Chol/HDL Ratio: 4 ratio (ref 0.0–5.0)
Cholesterol, Total: 174 mg/dL (ref 100–199)
HDL: 44 mg/dL (ref >39)
LDL Chol Calc (NIH): 108 mg/dL — ABNORMAL HIGH (ref 0–99)
Triglycerides: 121 mg/dL (ref 0–149)
VLDL Cholesterol Cal: 22 mg/dL (ref 5–40)

## 2023-07-03 LAB — TSH: TSH: 4.51 u[IU]/mL — ABNORMAL HIGH (ref 0.450–4.500)

## 2023-07-03 LAB — T3, FREE: T3, Free: 2.8 pg/mL (ref 2.0–4.4)

## 2023-07-03 LAB — T4, FREE: Free T4: 0.99 ng/dL (ref 0.82–1.77)

## 2023-07-10 ENCOUNTER — Other Ambulatory Visit: Payer: Self-pay | Admitting: Family Medicine

## 2023-07-10 DIAGNOSIS — E039 Hypothyroidism, unspecified: Secondary | ICD-10-CM

## 2023-09-17 ENCOUNTER — Encounter: Payer: Self-pay | Admitting: Family Medicine

## 2023-09-19 ENCOUNTER — Encounter: Payer: Self-pay | Admitting: Family Medicine

## 2023-10-02 ENCOUNTER — Ambulatory Visit (INDEPENDENT_AMBULATORY_CARE_PROVIDER_SITE_OTHER): Payer: Medicare Other | Admitting: Family Medicine

## 2023-10-02 ENCOUNTER — Encounter: Payer: Self-pay | Admitting: Family Medicine

## 2023-10-02 VITALS — BP 112/82 | HR 89 | Ht 74.02 in | Wt 234.5 lb

## 2023-10-02 DIAGNOSIS — M898X5 Other specified disorders of bone, thigh: Secondary | ICD-10-CM

## 2023-10-02 DIAGNOSIS — I10 Essential (primary) hypertension: Secondary | ICD-10-CM | POA: Diagnosis not present

## 2023-10-02 MED ORDER — CELECOXIB 100 MG PO CAPS: 100.0000 mg | ORAL_CAPSULE | Freq: Two times a day (BID) | ORAL | 2 refills | Status: DC | PRN

## 2023-10-02 NOTE — Patient Instructions (Addendum)
 I cannot see your most recent x-rays, but since it has been a few years I would like to repeat the x-rays of your right leg.  I have sent the orders in, so please go to [facility] imaging as a walk-in appointment in the next week or so. [facility] [address] [place], [place] [address] Phone [phone]   I will also send in some pain medicine for you. Additionally, you can take Tylenol 500 mg every 4 to 6 hours to reduce inflammation and pain.

## 2023-10-02 NOTE — Progress Notes (Addendum)
 Established Patient Office Visit  Subjective   Patient ID: George [name], male    DOB: 20-Jan-1951  Age: 73 y.o. MRN: [id number]  Chief Complaint  Patient presents with   Hypertension    HPI George [name] is a 73 y.o. male presenting today for follow up of hypertension.  He endorses continuous right leg aching.  History of femur ORIF in the 1970s, right leg pain has not been evaluated recently.  He describes a daily pain in his right leg similar to when you hit your thumb with a hammer which then changes to aching about 2-3 hours later. Hypertension:  Pt denies chest pain, SOB, dizziness, edema, syncope, fatigue or heart palpitations. Taking losartan and amlodipine, reports excellent compliance with treatment. Denies side effects.   Outpatient Medications Prior to Visit  Medication Sig   amLODipine (NORVASC) 5 MG tablet Take 1 tablet (5 mg total) by mouth daily.   aspirin EC 81 MG tablet Take 1 tablet (81 mg total) by mouth 2 (two) times daily.   DULoxetine (CYMBALTA) 20 MG capsule Take 2 capsules (40 mg total) by mouth daily.   hydrocortisone 2.5 % ointment Apply topically 2 (two) times daily as needed.   levothyroxine (SYNTHROID) 25 MCG tablet TAKE 1 TABLET (25 MCG TOTAL) BY MOUTH DAILY.   losartan (COZAAR) 50 MG tablet Take 1 tablet (50 mg total) by mouth daily.   No facility-administered medications prior to visit.    ROS Negative unless otherwise noted in HPI   Objective:     BP 112/82   Pulse 89   Ht 6' 2.02" (1.88 m)   Wt 234 lb 8 oz (106.4 kg)   SpO2 95%   BMI 30.09 kg/m   Physical Exam Constitutional:      General: He is not in acute distress.    Appearance: Normal appearance.  HENT:     Head: Normocephalic and atraumatic.  Cardiovascular:     Rate and Rhythm: Normal rate and regular rhythm.     Heart sounds: Normal heart sounds. No murmur heard.    No friction rub. No gallop.  Pulmonary:     Effort: Pulmonary effort is normal. No respiratory  distress.     Breath sounds: Rhonchi present. No wheezing or rales.  Skin:    General: Skin is warm and dry.  Neurological:     Mental Status: He is alert and oriented to person, place, and time.  Psychiatric:        Mood and Affect: Mood normal.      Assessment & Plan:  Essential hypertension Assessment & Plan: BP goal <130/80.  Patient slightly above goal today.  Continue checking blood pressure at home, continue losartan 50 mg daily and amlodipine 5 mg daily.  Will continue to monitor.  If blood pressure remains elevated in the future, will increase medication regimen.   Pain of right femur -     XR FEMUR, MIN 2 VIEWS RIGHT; Future -     Celecoxib; Take 1 capsule (100 mg total) by mouth 2 (two) times daily as needed for moderate pain (pain score 4-6) (for pain not controlled with Tylenol).  Dispense: 60 capsule; Refill: 2  Evaluating right leg pain with updated x-ray and sending prescription for celecoxib.  Selective Cox 2 inhibitor NSAID medication of choice given comorbid hypertension, recommend using Tylenol as first-line and saving celecoxib for any breakthrough pain not controlled by Tylenol.  Kidney function and liver function within normal limits on most recent  CMP.  Return in about 3 months (around 12/30/2023) for follow-up for right leg pain.    Melida Quitter, PA

## 2023-10-02 NOTE — Assessment & Plan Note (Signed)
 BP goal <130/80.  Patient slightly above goal today.  Continue checking blood pressure at home, continue losartan 50 mg daily and amlodipine 5 mg daily.  Will continue to monitor.  If blood pressure remains elevated in the future, will increase medication regimen.

## 2023-10-09 ENCOUNTER — Ambulatory Visit
Admission: RE | Admit: 2023-10-09 | Discharge: 2023-10-09 | Disposition: A | Discharge status: Home / Self Care | Payer: Medicare Other | Source: Ambulatory Visit | Attending: Family Medicine | Admitting: Family Medicine

## 2023-10-09 DIAGNOSIS — M898X5 Other specified disorders of bone, thigh: Secondary | ICD-10-CM

## 2023-10-12 ENCOUNTER — Other Ambulatory Visit: Payer: Self-pay | Admitting: Family Medicine

## 2023-10-12 DIAGNOSIS — E039 Hypothyroidism, unspecified: Secondary | ICD-10-CM

## 2023-10-15 ENCOUNTER — Ambulatory Visit: Payer: Medicare Other

## 2023-10-15 DIAGNOSIS — Z0000 Encounter for general adult medical examination without abnormal findings: Secondary | ICD-10-CM | POA: Diagnosis not present

## 2023-10-15 NOTE — Progress Notes (Signed)
 Subjective:   George [name] is a 73 y.o. who presents for a Medicare Wellness preventive visit.  Visit Complete: Virtual I connected with  [place] on 10/15/23 by a audio enabled telemedicine application and verified that I am speaking with the correct person using two identifiers.  Patient Location: Home  Provider Location: Home Office  I discussed the limitations of evaluation and management by telemedicine. The patient expressed understanding and agreed to proceed.  Vital Signs: Because this visit was a virtual/telehealth visit, some criteria may be missing or patient reported. Any vitals not documented were not able to be obtained and vitals that have been documented are patient reported.  VideoError- Librarian, academic were attempted between this provider and patient, however failed, due to patient having technical difficulties OR patient did not have access to video capability.  We continued and completed visit with audio only.   AWV Questionnaire: No: Patient Medicare AWV questionnaire was not completed prior to this visit.  Cardiac Risk Factors include: advanced age (>73men, >82 women);dyslipidemia;male gender     Objective:    Today's Vitals   There is no height or weight on file to calculate BMI.     10/15/2023   10:52 AM 10/09/2022    1:59 PM 12/23/2019    8:44 AM 12/22/2019    8:22 PM 11/17/2019    8:30 AM 11/14/2019    7:12 AM 11/08/2019   11:16 AM  Advanced Directives  Does Patient Have a Medical Advance Directive? Yes Yes Yes No Yes Yes Yes  Type of Estate agent of [place];Living will  Healthcare Power of [place];Living will  Healthcare Power of [place];Living will Healthcare Power of [place];Living will Living will;Healthcare Power of Attorney  Does patient want to make changes to medical advance directive?   No - Patient declined  No - Patient declined    Copy of Healthcare Power of Attorney in Chart?  No - copy requested  No - copy requested        Current Medications (verified) Outpatient Encounter Medications as of 10/15/2023  Medication Sig   amLODipine (NORVASC) 5 MG tablet Take 1 tablet (5 mg total) by mouth daily.   aspirin EC 81 MG tablet Take 1 tablet (81 mg total) by mouth 2 (two) times daily.   celecoxib (CELEBREX) 100 MG capsule Take 1 capsule (100 mg total) by mouth 2 (two) times daily as needed for moderate pain (pain score 4-6) (for pain not controlled with Tylenol).   DULoxetine (CYMBALTA) 20 MG capsule Take 2 capsules (40 mg total) by mouth daily.   hydrocortisone 2.5 % ointment Apply topically 2 (two) times daily as needed.   levothyroxine (SYNTHROID) 25 MCG tablet TAKE 1 TABLET (25 MCG TOTAL) BY MOUTH DAILY.   losartan (COZAAR) 50 MG tablet Take 1 tablet (50 mg total) by mouth daily.   No facility-administered encounter medications on file as of 10/15/2023.    Allergies (verified) Patient has no known allergies.   History: Past Medical History:  Diagnosis Date   AAA (abdominal aortic aneurysm) (HCC)    a. [place] 09/2015 - 3cm distal AAA. //  b. [place] 5/17: 3.1 cm    Basal cell carcinoma of right side of nose    Berger's disease    CKD (chronic kidney disease) 09/2015   COPD (chronic obstructive pulmonary disease) (HCC)    GERD (gastroesophageal reflux disease)    Nephrotic syndrome    Panic attacks    Sleep  apnea    SVT (supraventricular tachycardia) (HCC) 05/22/2015   Past Surgical History:  Procedure Laterality Date   BASAL CELL CARCINOMA EXCISION Right ~ 06/2015   "side of my nose"   ELECTROPHYSIOLOGIC STUDY N/A 09/10/2015   Procedure: SVT Ablation;  Surgeon: Marinus Maw, MD;  Location: [facility] INVASIVE CV LAB;  Service: Cardiovascular;  Laterality: N/A;   FEMUR FRACTURE SURGERY Right 1970   "had plate & pin put in"   FEMUR HARDWARE REMOVAL Right 1971   "removed plate; left pin in   FRACTURE SURGERY     KNEE ARTHROSCOPY Right ~ 1985   TONSILLECTOMY AND  ADENOIDECTOMY  ~ 1955   TOTAL HIP ARTHROPLASTY Left 11/14/2019   Procedure: LEFT TOTAL HIP ARTHROPLASTY ANTERIOR APPROACH;  Surgeon: Gean Birchwood, MD;  Location: [facility];  Service: Orthopedics;  Laterality: Left;   Family History  Problem Relation Age of Onset   Heart disease Mother        Before age 3   Heart attack Mother    Cancer Mother        lung and colon   Stroke Mother    Heart failure Father    Heart disease Father        After age 66   Heart attack Father    Hyperlipidemia Father    Healthy Maternal Grandmother    Healthy Maternal Grandfather    Diabetes Paternal Grandmother    Healthy Paternal Grandfather    Hypertension Neg Hx    Social History   Socioeconomic History   Marital status: Divorced    Spouse name: Not on file   Number of children: 0   Years of education: 12   Highest education level: Not on file  Occupational History   Occupation: Retired  Tobacco Use   Smoking status: Former    Current packs/day: 0.00    Average packs/day: 2.0 packs/day for 50.0 years (100.0 ttl pk-yrs)    Types: Cigarettes    Start date: 01/23/1965    Quit date: 01/24/2015    Years since quitting: 8.7    Passive exposure: Never   Smokeless tobacco: Never  Vaping Use   Vaping status: Never Used  Substance and Sexual Activity   Alcohol use: Not Currently    Alcohol/week: 3.0 - 4.0 standard drinks of alcohol    Types: 3 - 4 Shots of liquor per week   Drug use: No   Sexual activity: Not Currently  Other Topics Concern   Not on file  Social History Narrative   Fun: Trade out work, outdoors   Denies religious beliefs effecting health care.    Social Drivers of Corporate investment banker Strain: Low Risk  (10/15/2023)   Overall Financial Resource Strain (CARDIA)    Difficulty of Paying Living Expenses: Not hard at all  Food Insecurity: No Food Insecurity (10/15/2023)   Hunger Vital Sign    Worried About Running Out of Food in the Last Year: Never true    Ran Out of Food  in the Last Year: Never true  Transportation Needs: No Transportation Needs (10/15/2023)   PRAPARE - Administrator, Civil Service (Medical): No    Lack of Transportation (Non-Medical): No  Physical Activity: Inactive (10/15/2023)   Exercise Vital Sign    Days of Exercise per Week: 0 days    Minutes of Exercise per Session: 0 min  Stress: No Stress Concern Present (10/15/2023)   [employer] of Occupational Health - Occupational Stress Questionnaire  Feeling of Stress : Not at all  Social Connections: Socially Isolated (10/15/2023)   Social Connection and Isolation Panel [NHANES]    Frequency of Communication with Friends and Family: More than three times a week    Frequency of Social Gatherings with Friends and Family: More than three times a week    Attends Religious Services: Never    Database administrator or Organizations: No    Attends Engineer, structural: Never    Marital Status: Divorced    Tobacco Counseling Counseling given: Not Answered    Clinical Intake:  Pre-visit preparation completed: Yes  Pain : No/denies pain     Nutritional Risks: None Diabetes: No  How often do you need to have someone help you when you read instructions, pamphlets, or other written materials from your doctor or pharmacy?: 1 - Never  Interpreter Needed?: No  Information entered by :: NAllen LPN   Activities of Daily Living     10/15/2023   10:46 AM  In your present state of health, do you have any difficulty performing the following activities:  Hearing? 0  Vision? 0  Difficulty concentrating or making decisions? 0  Walking or climbing stairs? 0  Dressing or bathing? 0  Doing errands, shopping? 0  Preparing Food and eating ? N  Using the Toilet? N  In the past six months, have you accidently leaked urine? N  Do you have problems with loss of bowel control? N  Managing your Medications? N  Managing your Finances? N  Housekeeping or managing your  Housekeeping? N    Patient Care Team: Melida Quitter, PA as PCP - General (Family Medicine) Runell Gess, MD as Consulting Physician (Cardiology) Lauris Poag, MD as Consulting Physician (Nephrology) Marinus Maw, MD as Consulting Physician (Clinical Cardiac Electrophysiology) Comer, Belia Heman, MD as Consulting Physician (Infectious Diseases) Kennon Rounds as Physician Assistant (Cardiology) Dillingham, Alena Bills, DO as Attending Physician (Plastic Surgery)  Indicate any recent Medical Services you may have received from other than [facility] providers in the past year (date may be approximate).     Assessment:   This is a routine wellness examination for Jemar.  Hearing/Vision screen Hearing Screening - Comments:: Denies hearing issues Vision Screening - Comments:: No regular eye exams   Goals Addressed             This Visit's Progress    Patient Stated       10/15/2023, stay alive       Depression Screen     10/15/2023   10:53 AM 10/02/2023    9:36 AM 10/09/2022    2:02 PM 10/09/2022    1:58 PM 03/05/2022   11:14 AM 02/04/2022   10:50 AM 01/07/2022   10:47 AM  PHQ 2/9 Scores  PHQ - 2 Score 0 0 0 0 0 0 0  PHQ- 9 Score 0 0   0 0 0    Fall Risk     10/15/2023   10:53 AM 10/02/2023    9:36 AM 10/09/2022    1:54 PM 02/04/2022   10:50 AM 01/07/2022   10:47 AM  Fall Risk   Falls in the past year? 0 0 0 0 0  Number falls in past yr: 0 0 0 0 0  Injury with Fall? 0 0 0 0 0  Risk for fall due to : Medication side effect No Fall Risks  No Fall Risks No Fall Risks  Follow up Falls  prevention discussed;Falls evaluation completed Falls evaluation completed Falls evaluation completed Falls evaluation completed Falls evaluation completed    MEDICARE RISK AT HOME:  Medicare Risk at Home Any stairs in or around the home?: Yes If so, are there any without handrails?: No Home free of loose throw rugs in walkways, pet beds, electrical cords, etc?: Yes Adequate  lighting in your home to reduce risk of falls?: Yes Life alert?: No Use of a cane, walker or w/c?: No Grab bars in the bathroom?: Yes Shower chair or bench in shower?: Yes Elevated toilet seat or a handicapped toilet?: Yes  TIMED UP AND GO:  Was the test performed?  No  Cognitive Function: 6CIT completed    01/31/2015   12:20 PM  MMSE - Mini Mental State Exam  Orientation to time 5  Orientation to Place 5  Registration 3  Attention/ Calculation 5  Recall 3  Language- name 2 objects 2  Language- repeat 1  Language- follow 3 step command 3  Language- read & follow direction 1  Write a sentence 1  Copy design 1  Total score 30        10/15/2023   10:54 AM 10/09/2022    2:00 PM 07/01/2021   10:45 AM 02/17/2020    9:36 AM 08/25/2019   10:00 AM  6CIT Screen  What Year? 0 points 0 points 0 points 0 points 0 points  What month? 0 points 0 points 0 points 0 points 0 points  What time? 0 points 0 points 0 points 0 points 0 points  Count back from 20 0 points 0 points 0 points 0 points 0 points  Months in reverse 0 points 0 points 0 points 0 points 0 points  Repeat phrase 2 points 0 points 0 points 0 points 4 points  Total Score 2 points 0 points 0 points 0 points 4 points    Immunizations Immunization History  Administered Date(s) Administered   Hep A / Hep B 04/29/2017   IPV 12/23/1954, 01/20/1955, 09/15/1957   Pneumococcal Conjugate-13 04/29/2017   Pneumococcal Polysaccharide-23 08/23/2015   Tdap 01/31/2015    Screening Tests Health Maintenance  Topic Date Due   COVID-19 Vaccine (1) Never done   Zoster Vaccines- Shingrix (1 of 2) Never done   Colonoscopy  Never done   Lung Cancer Screening  12/20/2020   INFLUENZA VACCINE  11/09/2023 (Originally 03/12/2023)   Medicare Annual Wellness (AWV)  10/14/2024   DTaP/Tdap/Td (2 - Td or Tdap) 01/30/2025   Pneumonia Vaccine 27+ Years old  Completed   Hepatitis C Screening  Completed   HPV VACCINES  Aged Out    Health  Maintenance  Health Maintenance Due  Topic Date Due   COVID-19 Vaccine (1) Never done   Zoster Vaccines- Shingrix (1 of 2) Never done   Colonoscopy  Never done   Lung Cancer Screening  12/20/2020   Health Maintenance Items Addressed: Declines health maintenance   Additional Screening:  Vision Screening: Recommended annual ophthalmology exams for early detection of glaucoma and other disorders of the eye.  Dental Screening: Recommended annual dental exams for proper oral hygiene  Community Resource Referral / Chronic Care Management: CRR required this visit?  No   CCM required this visit?  No     Plan:     I have personally reviewed and noted the following in the patient's chart:   Medical and social history Use of alcohol, tobacco or illicit drugs  Current medications and supplements including opioid prescriptions. Patient  is not currently taking opioid prescriptions. Functional ability and status Nutritional status Physical activity Advanced directives List of other physicians Hospitalizations, surgeries, and ER visits in previous 12 months Vitals Screenings to include cognitive, depression, and falls Referrals and appointments  In addition, I have reviewed and discussed with patient certain preventive protocols, quality metrics, and best practice recommendations. A written personalized care plan for preventive services as well as general preventive health recommendations were provided to patient.     Barb Merino, LPN   08/16/1094   After Visit Summary: (Pick Up) Due to this being a telephonic visit, with patients personalized plan was offered to patient and patient has requested to Pick up at office.  Notes: Nothing significant to report at this time.

## 2023-10-15 NOTE — Patient Instructions (Signed)
 Mr. George [name] , Thank you for taking time to come for your Medicare Wellness Visit. I appreciate your ongoing commitment to your health goals. Please review the following plan we discussed and let me know if I can assist you in the future.   Referrals/Orders/Follow-Ups/Clinician Recommendations: none  This is a list of the screening recommended for you and due dates:  Health Maintenance  Topic Date Due   COVID-19 Vaccine (1) Never done   Zoster (Shingles) Vaccine (1 of 2) Never done   Colon Cancer Screening  Never done   Screening for Lung Cancer  12/20/2020   Flu Shot  11/09/2023*   Medicare Annual Wellness Visit  10/14/2024   DTaP/Tdap/Td vaccine (2 - Td or Tdap) 01/30/2025   Pneumonia Vaccine  Completed   Hepatitis C Screening  Completed   HPV Vaccine  Aged Out  *Topic was postponed. The date shown is not the original due date.    Advanced directives: (Copy Requested) Please bring a copy of your health care power of attorney and living will to the office to be added to your chart at your convenience.  Next Medicare Annual Wellness Visit scheduled for next year: Yes  insert Preventive Care attachment Insert FALL PREVENTION attachment if needed

## 2023-12-28 ENCOUNTER — Other Ambulatory Visit: Payer: Self-pay | Admitting: Family Medicine

## 2023-12-28 DIAGNOSIS — N02B9 Other recurrent and persistent immunoglobulin A nephropathy: Secondary | ICD-10-CM

## 2023-12-28 DIAGNOSIS — I151 Hypertension secondary to other renal disorders: Secondary | ICD-10-CM

## 2024-02-01 ENCOUNTER — Other Ambulatory Visit: Payer: Self-pay | Admitting: Family Medicine

## 2024-02-01 DIAGNOSIS — I151 Hypertension secondary to other renal disorders: Secondary | ICD-10-CM

## 2024-02-02 ENCOUNTER — Ambulatory Visit: Admitting: Family Medicine

## 2024-02-02 ENCOUNTER — Ambulatory Visit (INDEPENDENT_AMBULATORY_CARE_PROVIDER_SITE_OTHER)

## 2024-02-02 VITALS — BP 128/82 | HR 101 | Ht 74.0 in | Wt 234.0 lb

## 2024-02-02 DIAGNOSIS — M79604 Pain in right leg: Secondary | ICD-10-CM

## 2024-02-02 DIAGNOSIS — I10 Essential (primary) hypertension: Secondary | ICD-10-CM

## 2024-02-02 NOTE — Progress Notes (Signed)
 Established Patient Office Visit  Subjective   Patient ID: George [name], male    DOB: 1951-02-19  Age: 73 y.o. MRN: [id number]  Chief Complaint  Patient presents with   Hypertension    HPI  George [name] is a 73 y.o. y/o male who presents to the clinic today for follow up on hypertension, right leg pain.  HTN: Patient currently taking losartan  and amlodipine   for control of their blood pressure. Reports excellence compliance with this medication. Denies side effects or episodes of hypotension. Checks BP at home periodically. Denies CP, SOB, Palpitations, vision changes, HA, or edema.  Right leg pain: History of femur ORIF in the 1970s, right leg pain has not been evaluated recently.  At his last visit with [name], he ordered an x-ray of his right femur for further evaluation of his pain. The XR revealed healed fracture deformity with rod in place as described by patient.  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  Patient was instructed to continue taking Tylenol  for pain control and using the Celebrex  prescribed by [facility] for breakthrough pain.  Today patient reports that his leg pain is well controlled when he takes the celebrex  as needed. Reports that he only takes it once to twice per day, some days not requiring it at all.      ROS Per HPI.    Objective:     BP 128/82   Pulse (!) 101   Ht 6' 2 (1.88 m)   Wt 234 lb 0.6 oz (106.2 kg)   SpO2 93%   BMI 30.05 kg/m    Physical Exam Constitutional:      General: He is not in acute distress.    Appearance: Normal appearance.   Cardiovascular:     Rate and Rhythm: Normal rate and regular rhythm.     Heart sounds: Normal heart sounds. No murmur heard.    No friction rub. No gallop.  Pulmonary:     Effort: Pulmonary effort is normal. No respiratory distress.     Breath sounds: Normal breath sounds.   Musculoskeletal:        General: No  swelling.     Right upper leg: Normal.     Left upper leg: Normal.     Right knee: Normal.     Left knee: Normal.   Skin:    General: Skin is warm and dry.   Neurological:     General: No focal deficit present.     Mental Status: He is alert.   Psychiatric:        Mood and Affect: Mood normal.        Behavior: Behavior normal.        Thought Content: Thought content normal.      No results found for any visits on 02/02/24.    The ASCVD Risk score (Arnett DK, et al., 2019) failed to calculate for the following reasons:   Risk score cannot be calculated because patient has a medical history suggesting prior/existing ASCVD    Assessment & Plan:   Essential hypertension Assessment & Plan: BP goal <130/80.  Blood pressure at goal in office today.  Patient also reports blood pressure is at goal on ambulatory blood pressure monitoring.  Continue losartan  50 mg daily and amlodipine  5 mg daily for blood pressure.  Will continue to monitor.   Chronic Right leg pain-  pt w/ history of ORIF femur 30-40 years ago  increasing pain for 1 year, terrible pain 3 months Assessment & Plan: Stable and improved on Celebrex  100 mg twice daily.  Reviewed x-ray results of right femur with the patient and informed him of the findings including stable and healed femur fracture with rod and screws in place and osteoarthritis in the knee.  Advised patient to continue taking this medication as needed for pain control.  Had a discussion with patient that if his arthritis worsens or he has no longer getting relief from the Celebrex , we can discuss steroid injections for further pain control.  Will continue to monitor.    Return in about 5 months (around 07/04/2024) for Physical.    Saddie JULIANNA Sacks, PA-C

## 2024-02-02 NOTE — Assessment & Plan Note (Addendum)
 Stable and improved on Celebrex  100 mg twice daily.  Reviewed x-ray results of right femur with the patient and informed him of the findings including stable and healed femur fracture with rod and screws in place and osteoarthritis in the knee.  Advised patient to continue taking this medication as needed for pain control.  Had a discussion with patient that if his arthritis worsens or he has no longer getting relief from the Celebrex , we can discuss steroid injections for further pain control.  Will continue to monitor.

## 2024-02-02 NOTE — Patient Instructions (Signed)
 It was nice to see you today!  As we discussed in clinic:  -Continue taking the Celebrex  as needed for leg pain. I am glad that it has improved so much!  -Continue your blood pressure medications and checking blood pressure periodically at home.   We will plan to do your physical around November with fasting labs the week before.  It was nice to meet you!  If you have any problems before your next visit feel free to message me via MyChart (minor issues or questions) or call the office, otherwise you may reach out to schedule an office visit.  Thank you! Saddie Sacks, PA-C

## 2024-02-02 NOTE — Assessment & Plan Note (Signed)
 BP goal <130/80.  Blood pressure at goal in office today.  Patient also reports blood pressure is at goal on ambulatory blood pressure monitoring.  Continue losartan  50 mg daily and amlodipine  5 mg daily for blood pressure.  Will continue to monitor.

## 2024-03-21 ENCOUNTER — Other Ambulatory Visit: Payer: Self-pay | Admitting: Family Medicine

## 2024-03-21 DIAGNOSIS — M898X5 Other specified disorders of bone, thigh: Secondary | ICD-10-CM

## 2024-04-08 ENCOUNTER — Other Ambulatory Visit: Payer: Self-pay | Admitting: Family Medicine

## 2024-04-08 DIAGNOSIS — E039 Hypothyroidism, unspecified: Secondary | ICD-10-CM

## 2024-06-20 ENCOUNTER — Other Ambulatory Visit: Payer: Self-pay

## 2024-06-20 DIAGNOSIS — E559 Vitamin D deficiency, unspecified: Secondary | ICD-10-CM

## 2024-06-20 DIAGNOSIS — E039 Hypothyroidism, unspecified: Secondary | ICD-10-CM

## 2024-06-20 DIAGNOSIS — Z8639 Personal history of other endocrine, nutritional and metabolic disease: Secondary | ICD-10-CM

## 2024-06-20 DIAGNOSIS — I10 Essential (primary) hypertension: Secondary | ICD-10-CM

## 2024-06-20 DIAGNOSIS — E785 Hyperlipidemia, unspecified: Secondary | ICD-10-CM

## 2024-06-21 ENCOUNTER — Other Ambulatory Visit: Payer: Self-pay | Admitting: Family Medicine

## 2024-06-21 DIAGNOSIS — I151 Hypertension secondary to other renal disorders: Secondary | ICD-10-CM

## 2024-06-21 DIAGNOSIS — F411 Generalized anxiety disorder: Secondary | ICD-10-CM

## 2024-06-21 DIAGNOSIS — N049 Nephrotic syndrome with unspecified morphologic changes: Secondary | ICD-10-CM

## 2024-07-01 ENCOUNTER — Other Ambulatory Visit

## 2024-07-01 DIAGNOSIS — I10 Essential (primary) hypertension: Secondary | ICD-10-CM

## 2024-07-01 DIAGNOSIS — E559 Vitamin D deficiency, unspecified: Secondary | ICD-10-CM

## 2024-07-01 DIAGNOSIS — E785 Hyperlipidemia, unspecified: Secondary | ICD-10-CM

## 2024-07-01 DIAGNOSIS — E039 Hypothyroidism, unspecified: Secondary | ICD-10-CM

## 2024-07-01 DIAGNOSIS — Z8639 Personal history of other endocrine, nutritional and metabolic disease: Secondary | ICD-10-CM

## 2024-07-02 LAB — LIPID PANEL
Chol/HDL Ratio: 5.6 ratio — ABNORMAL HIGH (ref 0.0–5.0)
Cholesterol, Total: 192 mg/dL (ref 100–199)
HDL: 34 mg/dL — ABNORMAL LOW (ref >39)
LDL Chol Calc (NIH): 131 mg/dL — ABNORMAL HIGH (ref 0–99)
Triglycerides: 150 mg/dL — ABNORMAL HIGH (ref 0–149)
VLDL Cholesterol Cal: 27 mg/dL (ref 5–40)

## 2024-07-02 LAB — CBC WITH DIFFERENTIAL/PLATELET
Basophils Absolute: 0.1 x10E3/uL (ref 0.0–0.2)
Basos: 2 %
EOS (ABSOLUTE): 0.3 x10E3/uL (ref 0.0–0.4)
Eos: 4 %
Hematocrit: 52.9 % — ABNORMAL HIGH (ref 37.5–51.0)
Hemoglobin: 17.5 g/dL (ref 13.0–17.7)
Immature Grans (Abs): 0.1 x10E3/uL (ref 0.0–0.1)
Immature Granulocytes: 2 %
Lymphocytes Absolute: 1.4 x10E3/uL (ref 0.7–3.1)
Lymphs: 19 %
MCH: 30.9 pg (ref 26.6–33.0)
MCHC: 33.1 g/dL (ref 31.5–35.7)
MCV: 94 fL (ref 79–97)
Monocytes Absolute: 0.5 x10E3/uL (ref 0.1–0.9)
Monocytes: 7 %
Neutrophils Absolute: 4.8 x10E3/uL (ref 1.4–7.0)
Neutrophils: 66 %
Platelets: 259 x10E3/uL (ref 150–450)
RBC: 5.66 x10E6/uL (ref 4.14–5.80)
RDW: 13.1 % (ref 11.6–15.4)
WBC: 7.2 x10E3/uL (ref 3.4–10.8)

## 2024-07-02 LAB — COMPREHENSIVE METABOLIC PANEL WITH GFR
ALT: 19 IU/L (ref 0–44)
AST: 25 IU/L (ref 0–40)
Albumin: 4.7 g/dL (ref 3.8–4.8)
Alkaline Phosphatase: 90 IU/L (ref 47–123)
BUN/Creatinine Ratio: 15 (ref 10–24)
BUN: 14 mg/dL (ref 8–27)
Bilirubin Total: 0.4 mg/dL (ref 0.0–1.2)
CO2: 24 mmol/L (ref 20–29)
Calcium: 9.8 mg/dL (ref 8.6–10.2)
Chloride: 99 mmol/L (ref 96–106)
Creatinine, Ser: 0.93 mg/dL (ref 0.76–1.27)
Globulin, Total: 2.5 g/dL (ref 1.5–4.5)
Glucose: 107 mg/dL — ABNORMAL HIGH (ref 70–99)
Potassium: 4.6 mmol/L (ref 3.5–5.2)
Sodium: 138 mmol/L (ref 134–144)
Total Protein: 7.2 g/dL (ref 6.0–8.5)
eGFR: 86 mL/min/1.73 (ref >59)

## 2024-07-02 LAB — HEMOGLOBIN A1C
Est. average glucose Bld gHb Est-mCnc: 123 mg/dL
Hgb A1c MFr Bld: 5.9 % — ABNORMAL HIGH (ref 4.8–5.6)

## 2024-07-02 LAB — THYROID PANEL WITH TSH
Free Thyroxine Index: 1.3 (ref 1.2–4.9)
T3 Uptake Ratio: 24 % (ref 24–39)
T4, Total: 5.5 ug/dL (ref 4.5–12.0)
TSH: 4.1 u[IU]/mL (ref 0.450–4.500)

## 2024-07-02 LAB — VITAMIN D 25 HYDROXY (VIT D DEFICIENCY, FRACTURES): Vit D, 25-Hydroxy: 22.5 ng/mL — ABNORMAL LOW (ref 30.0–100.0)

## 2024-07-04 ENCOUNTER — Ambulatory Visit: Payer: Self-pay

## 2024-07-05 ENCOUNTER — Other Ambulatory Visit: Payer: Self-pay

## 2024-07-05 DIAGNOSIS — M898X5 Other specified disorders of bone, thigh: Secondary | ICD-10-CM

## 2024-07-11 ENCOUNTER — Ambulatory Visit (INDEPENDENT_AMBULATORY_CARE_PROVIDER_SITE_OTHER)

## 2024-07-11 VITALS — BP 109/72 | HR 83 | Temp 98.2°F | Ht 74.0 in | Wt 239.0 lb

## 2024-07-11 DIAGNOSIS — J449 Chronic obstructive pulmonary disease, unspecified: Secondary | ICD-10-CM

## 2024-07-11 DIAGNOSIS — E785 Hyperlipidemia, unspecified: Secondary | ICD-10-CM

## 2024-07-11 DIAGNOSIS — N049 Nephrotic syndrome with unspecified morphologic changes: Secondary | ICD-10-CM

## 2024-07-11 DIAGNOSIS — E559 Vitamin D deficiency, unspecified: Secondary | ICD-10-CM | POA: Diagnosis not present

## 2024-07-11 DIAGNOSIS — E039 Hypothyroidism, unspecified: Secondary | ICD-10-CM

## 2024-07-11 DIAGNOSIS — F17200 Nicotine dependence, unspecified, uncomplicated: Secondary | ICD-10-CM | POA: Insufficient documentation

## 2024-07-11 DIAGNOSIS — I10 Essential (primary) hypertension: Secondary | ICD-10-CM | POA: Diagnosis not present

## 2024-07-11 DIAGNOSIS — F411 Generalized anxiety disorder: Secondary | ICD-10-CM

## 2024-07-11 DIAGNOSIS — N02B9 Other recurrent and persistent immunoglobulin A nephropathy: Secondary | ICD-10-CM

## 2024-07-11 NOTE — Assessment & Plan Note (Addendum)
 Last lipid panel: LDL 131, HDL 34, Trig 150. Elevated from previous. 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 Discussed my recommendation for moderate to high intensity statin, patient declined. Discussed risks and benefits of this decision. Will cont to monitor and revisit discussion at next visit.

## 2024-07-11 NOTE — Progress Notes (Signed)
 Established Patient Office Visit  Subjective   Patient ID: George [name], male    DOB: August 04, 1951  Age: 73 y.o. MRN: [id number]  Chief Complaint  Patient presents with   Medical Management of Chronic Issues    HPI  Discussed the use of AI scribe software for clinical note transcription with the patient, who gave verbal consent to proceed.  History of Present Illness   George [name] is a 73 year old male with a history of NSTEMI and COPD who presents for a six-month checkup.  Cerebrovascular disease and lipid abnormalities - History of NSTEMI in 2016 - Recent laboratory evaluation revealed mild increases in triglycerides and LDL cholesterol compared to prior year - No current or prior use of cholesterol-lowering medications - Daily aspirin  use  Glycemic control - Hemoglobin A1c remains stable at 5.9%, consistent with prediabetes  Vitamin d  deficiency - Recent laboratory evaluation showed mildly decreased vitamin D  levels - Initiated multivitamin (Centrum for Men) containing vitamin D  approximately one month ago  Chronic obstructive pulmonary disease (copd) and tobacco use - COPD without use of maintenance inhaler - Breathing described as 'pretty good' - Uses albuterol  as a rescue inhaler, without recent exacerbations - Resumed smoking, currently half a pack per day after previous cessation - No interest in lung cancer screening - UTD on PCV20 vaccine  - Declines flu and covid vaccines    Medication management and anxiety - Current medications include amlodipine  5 mg, losartan  50 mg, levothyroxine  25 mcg, Celebrex , and duloxetine  40 mg daily for anxiety          ROS Per HPI.    Objective:     BP 109/72   Pulse 83   Temp 98.2 F (36.8 C) (Oral)   Ht 6' 2 (1.88 m)   Wt 239 lb (108.4 kg)   SpO2 95%   BMI 30.69 kg/m    Physical Exam Constitutional:      General: He is not in acute distress.    Appearance: Normal appearance.  Cardiovascular:      Rate and Rhythm: Normal rate and regular rhythm.     Heart sounds: Normal heart sounds. No murmur heard.    No friction rub. No gallop.  Pulmonary:     Effort: Pulmonary effort is normal. No respiratory distress.     Breath sounds: Rhonchi present. No wheezing.  Abdominal:     General: Bowel sounds are normal.  Musculoskeletal:        General: No swelling.     Cervical back: Neck supple.  Lymphadenopathy:     Cervical: No cervical adenopathy.  Skin:    General: Skin is warm and dry.  Neurological:     General: No focal deficit present.     Mental Status: He is alert.  Psychiatric:        Mood and Affect: Mood normal.        Behavior: Behavior normal.        Thought Content: Thought content normal.      No results found for any visits on 07/11/24.  Last CBC Lab Results  Component Value Date   WBC 7.2 07/01/2024   HGB 17.5 07/01/2024   HCT 52.9 (H) 07/01/2024   MCV 94 07/01/2024   MCH 30.9 07/01/2024   RDW 13.1 07/01/2024   PLT 259 07/01/2024   Last metabolic panel Lab Results  Component Value Date   GLUCOSE 107 (H) 07/01/2024   NA 138 07/01/2024   K 4.6 07/01/2024  CL 99 07/01/2024   CO2 24 07/01/2024   BUN 14 07/01/2024   CREATININE 0.93 07/01/2024   EGFR 86 07/01/2024   CALCIUM  9.8 07/01/2024   PROT 7.2 07/01/2024   ALBUMIN 4.7 07/01/2024   LABGLOB 2.5 07/01/2024   AGRATIO 2.1 10/09/2022   BILITOT 0.4 07/01/2024   ALKPHOS 90 07/01/2024   AST 25 07/01/2024   ALT 19 07/01/2024   ANIONGAP 9 12/23/2019   Last lipids Lab Results  Component Value Date   CHOL 192 07/01/2024   HDL 34 (L) 07/01/2024   LDLCALC 131 (H) 07/01/2024   TRIG 150 (H) 07/01/2024   CHOLHDL 5.6 (H) 07/01/2024   Last hemoglobin A1c Lab Results  Component Value Date   HGBA1C 5.9 (H) 07/01/2024   Last thyroid  functions Lab Results  Component Value Date   TSH 4.100 07/01/2024   T3TOTAL 102 08/25/2019   T4TOTAL 5.5 07/01/2024   FREET4 0.99 07/02/2023   Last vitamin D  Lab  Results  Component Value Date   VD25OH 22.5 (L) 07/01/2024      The ASCVD Risk score (Arnett DK, et al., 2019) failed to calculate for the following reasons:   Risk score cannot be calculated because patient has a medical history suggesting prior/existing ASCVD    Assessment & Plan:   Vitamin D  deficiency Assessment & Plan: Vitamin D  within insufficient range. Recently started a Men's MV with vitamin D . Will recheck levels in 4 months at follow up  Orders: -     VITAMIN D  25 Hydroxy (Vit-D Deficiency, Fractures); Future  Hyperlipidemia, unspecified hyperlipidemia type Assessment & Plan: Last lipid panel: LDL 131, HDL 34, Trig 150. Elevated from previous. 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 Discussed my recommendation for moderate to high intensity statin, patient declined. Discussed risks and benefits of this decision. Will cont to monitor and revisit discussion at next visit.  Orders: -     Lipid panel; Future -     Comprehensive metabolic panel with GFR; Future  COPD mixed type [facility]) Assessment & Plan: Patient has declined medication therapy and low-dose chest CT for lung cancer screening. Discussed risks and benefits of triple therapy such as Trelegy. Patient declined at this time. Discussed importance of smoking cessation. Will cont to monitor.   Essential hypertension Assessment & Plan: BP goal <130/80.  Blood pressure at goal in office today.  Patient also reports blood pressure is at goal on ambulatory blood pressure monitoring.  Continue losartan  50 mg daily and amlodipine  5 mg daily for blood pressure. CMP WNL. Will continue to monitor.     Hypothyroidism, unspecified type Assessment & Plan: Thyroid  panel WNL. Continue levothyroxine  25 mcg daily.   GAD (generalized anxiety disorder) Assessment & Plan: Stable and well controlled on Cymbalta  40 mg daily.   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   Tobacco use disorder Assessment & Plan: Previously documented 150 pk-year hx. Has recently restarted smoking 0.5 ppd. Discussed importance of cessation due to pre-existing ASCVD and COPD. Not interested in quitting at this time.      Return in about 4 months (around 11/09/2024) for HTN, HLD, COPD.    Saddie JULIANNA Sacks, PA-C

## 2024-07-11 NOTE — Assessment & Plan Note (Signed)
 Thyroid  panel WNL. Continue levothyroxine  25 mcg daily.

## 2024-07-11 NOTE — Assessment & Plan Note (Signed)
 Stable and well controlled on Cymbalta  40 mg daily.

## 2024-07-11 NOTE — Assessment & Plan Note (Signed)
 BP goal <130/80.  Blood pressure at goal in office today.  Patient also reports blood pressure is at goal on ambulatory blood pressure monitoring.  Continue losartan  50 mg daily and amlodipine  5 mg daily for blood pressure. CMP WNL. Will continue to monitor.

## 2024-07-11 NOTE — Assessment & Plan Note (Signed)
 Last CMP: eGFR: 86, creatinine 0.93 Will cont to monitor.

## 2024-07-11 NOTE — Assessment & Plan Note (Signed)
 Previously documented 150 pk-year hx. Has recently restarted smoking 0.5 ppd. Discussed importance of cessation due to pre-existing ASCVD and COPD. Not interested in quitting at this time.

## 2024-07-11 NOTE — Assessment & Plan Note (Signed)
 Patient has declined medication therapy and low-dose chest CT for lung cancer screening. Discussed risks and benefits of triple therapy such as Trelegy. Patient declined at this time. Discussed importance of smoking cessation. Will cont to monitor.

## 2024-07-11 NOTE — Assessment & Plan Note (Signed)
 Vitamin D  within insufficient range. Recently started a Men's MV with vitamin D . Will recheck levels in 4 months at follow up

## 2024-07-11 NOTE — Patient Instructions (Addendum)
 VISIT SUMMARY: You had a six-month checkup to review your overall health, including your history of heart attack, COPD, and other conditions. Your blood pressure and thyroid  function are well-controlled, and your A1c level remains stable. We discussed your cholesterol levels, vitamin D  deficiency, and smoking habits.  YOUR PLAN: CEREBROVASCULAR DISEASE AND LIPID ABNORMALITIES: You have a history of heart attack and elevated cholesterol levels, which can increase your risk of another heart attack -Continue taking your daily aspirin . -We will recheck your cholesterol levels in 4 months.   GLYCEMIC CONTROL: Your A1c level is stable at 5.9%, which is consistent with prediabetes. -Continue monitoring your A1c levels.  VITAMIN D  DEFICIENCY: Your recent lab results showed slightly low vitamin D  levels. -Continue taking your multivitamin (Centrum for Men) and we will monitor your vitamin D  levels.  CHRONIC OBSTRUCTIVE PULMONARY DISEASE (COPD): You have COPD and are currently using albuterol  as a rescue inhaler. -Consider using a maintenance inhaler if your breathing worsens. -Continue using albuterol  as needed.  ESSENTIAL HYPERTENSION: Your blood pressure is well-controlled with your current medications. -Continue taking amlodipine  and losartan  as prescribed.  HYPOTHYROIDISM: Your thyroid  function is well-managed with levothyroxine . -Continue taking levothyroxine  as prescribed.  OSTEOARTHRITIS: Your osteoarthritis pain is controlled with Celebrex . -Continue taking Celebrex  for pain management.  ANXIETY DISORDER: Your anxiety is managed with duloxetine . -Continue taking duloxetine  40 mg once daily.  NICOTINE DEPENDENCE, CURRENT SMOKER: You have resumed smoking, which can impact your COPD and overall health. -We discussed the impact of smoking on your health. Consider quitting smoking to improve your COPD and overall health.  If you have any problems before your next visit feel free to message  me via MyChart (minor issues or questions) or call the office, otherwise you may reach out to schedule an office visit.  Thank you! Saddie Sacks, PA-C

## 2024-07-22 ENCOUNTER — Telehealth: Payer: Self-pay

## 2024-07-22 NOTE — Telephone Encounter (Signed)
 Copied from CRM #[id number]. Topic: Appointments - Appointment Info/Confirmation >> Jul 22, 2024 10:57 AM George [name] wrote: The patient called in to confirm he got the message about his appointment being changed from April 1st to April 9th and he is good with that.

## 2024-08-02 ENCOUNTER — Other Ambulatory Visit: Payer: Self-pay

## 2024-08-02 DIAGNOSIS — I151 Hypertension secondary to other renal disorders: Secondary | ICD-10-CM

## 2024-11-02 ENCOUNTER — Other Ambulatory Visit

## 2024-11-09 ENCOUNTER — Ambulatory Visit

## 2024-11-10 ENCOUNTER — Ambulatory Visit

## 2024-11-17 ENCOUNTER — Ambulatory Visit
